# Patient Record
Sex: Female | Born: 1945 | Race: White | Hispanic: No | Marital: Married | Smoking: Former smoker
Health system: Southern US, Community
[De-identification: ages and names within clinical notes are randomized; demographics above are authoritative.]

## PROBLEM LIST (undated history)

## (undated) DIAGNOSIS — K649 Unspecified hemorrhoids: Secondary | ICD-10-CM

## (undated) DIAGNOSIS — K219 Gastro-esophageal reflux disease without esophagitis: Secondary | ICD-10-CM

## (undated) DIAGNOSIS — E669 Obesity, unspecified: Secondary | ICD-10-CM

## (undated) DIAGNOSIS — M1990 Unspecified osteoarthritis, unspecified site: Secondary | ICD-10-CM

## (undated) DIAGNOSIS — L259 Unspecified contact dermatitis, unspecified cause: Secondary | ICD-10-CM

## (undated) DIAGNOSIS — I4891 Unspecified atrial fibrillation: Secondary | ICD-10-CM

## (undated) DIAGNOSIS — K5790 Diverticulosis of intestine, part unspecified, without perforation or abscess without bleeding: Secondary | ICD-10-CM

## (undated) DIAGNOSIS — I509 Heart failure, unspecified: Secondary | ICD-10-CM

## (undated) DIAGNOSIS — I4819 Other persistent atrial fibrillation: Secondary | ICD-10-CM

## (undated) DIAGNOSIS — M549 Dorsalgia, unspecified: Secondary | ICD-10-CM

## (undated) HISTORY — DX: Other persistent atrial fibrillation: I48.19

## (undated) HISTORY — PX: OTHER SURGICAL HISTORY: SHX169

## (undated) HISTORY — PX: TUBAL LIGATION: SHX77

## (undated) HISTORY — PX: CHOLECYSTECTOMY: SHX55

## (undated) HISTORY — DX: Obesity, unspecified: E66.9

## (undated) HISTORY — DX: Unspecified atrial fibrillation: I48.91

## (undated) SURGERY — Surgical Case
Anesthesia: *Unknown

---

## 2001-12-30 ENCOUNTER — Emergency Department (HOSPITAL_COMMUNITY): Admission: EM | Admit: 2001-12-30 | Discharge: 2001-12-30 | Payer: Self-pay | Admitting: Emergency Medicine

## 2001-12-30 ENCOUNTER — Encounter: Payer: Self-pay | Admitting: Emergency Medicine

## 2002-03-10 ENCOUNTER — Encounter (HOSPITAL_COMMUNITY): Admission: RE | Admit: 2002-03-10 | Discharge: 2002-04-09 | Payer: Self-pay | Admitting: Preventative Medicine

## 2002-12-16 ENCOUNTER — Ambulatory Visit (HOSPITAL_COMMUNITY): Admission: RE | Admit: 2002-12-16 | Discharge: 2002-12-16 | Payer: Self-pay | Admitting: Family Medicine

## 2002-12-16 ENCOUNTER — Encounter: Payer: Self-pay | Admitting: Family Medicine

## 2003-06-19 ENCOUNTER — Ambulatory Visit (HOSPITAL_COMMUNITY): Admission: RE | Admit: 2003-06-19 | Discharge: 2003-06-19 | Payer: Self-pay | Admitting: Family Medicine

## 2003-10-17 ENCOUNTER — Ambulatory Visit (HOSPITAL_COMMUNITY): Admission: RE | Admit: 2003-10-17 | Discharge: 2003-10-17 | Payer: Self-pay | Admitting: Orthopedic Surgery

## 2005-02-06 ENCOUNTER — Ambulatory Visit (HOSPITAL_COMMUNITY): Admission: RE | Admit: 2005-02-06 | Discharge: 2005-02-06 | Payer: Self-pay | Admitting: Family Medicine

## 2005-04-15 ENCOUNTER — Observation Stay (HOSPITAL_COMMUNITY): Admission: EM | Admit: 2005-04-15 | Discharge: 2005-04-16 | Payer: Self-pay | Admitting: Emergency Medicine

## 2006-12-30 ENCOUNTER — Ambulatory Visit (HOSPITAL_COMMUNITY): Admission: RE | Admit: 2006-12-30 | Discharge: 2006-12-30 | Payer: Self-pay | Admitting: Family Medicine

## 2010-09-13 NOTE — Consult Note (Signed)
Brooke Luna, PRAZAK NO.:  [id number]   MEDICAL RECORD NO.:  [id number]          PATIENT TYPE:  INP   LOCATION:  A222                          FACILITY:  [facility]   PHYSICIAN:  Nanetta Batty, M.D.   DATE OF BIRTH:  [birth year]   DATE OF CONSULTATION:  04/16/2005  DATE OF DISCHARGE:                                   CONSULTATION   REFERRING PHYSICIAN:  Corrie Mckusick, M.D.   REASON FOR CONSULTATION:  Ms. Hufstedler is a 65 year old, mildly overweight,  married, white female, mother of two, grandmother to four grandchildren who  is referred by Dr. Phillips Odor for evaluation of chest pain.   The patient has basically no cardiac risk factors.  She does have reflux and  takes occasional PPI, but she is on no other medications.  She apparently  had a stress Cardiolite in our [place] office several years ago which was  negative.  She otherwise is a fairly healthy lady.  Yesterday, in the late  afternoon, she developed a prolonged episode of substernal chest pain which  doubled her over.  It radiated to her left upper extremity and was  associated with diaphoresis and resolved spontaneously.  She was admitted to  [facility] through the emergency room and ruled out for myocardial  infarction.   MEDICATIONS:  None.   ALLERGIES:  No known drug allergies.   REVIEW OF SYSTEMS:  See above.   PHYSICAL EXAMINATION:  VITAL SIGNS:  Blood pressure 116/77, pulse 69.  LUNGS:  Clear to auscultation.  HEART:  Regular rate and rhythm without murmurs, rubs or gallops.  ABDOMEN:  Soft, nontender with active bowel sounds.  No hepatosplenomegaly,  organomegaly or audible bruits.  EXTREMITIES:  Femoral arteries without bruits.  Intact pedal pulses.   LABORATORY DATA AND X-RAY FINDINGS:  Initial 12-lead EKG revealed sinus  rhythm without active T wave changes.  Followup serial EKGs performed this  morning revealed no anterolateral T wave inversion.  Her chest x-ray shows  no active  disease.   Laboratory exam was completely unremarkable including myocardial markers.   IMPRESSION:  Ms. Long had episode of unstable with dynamic  electrocardiogram changes.   RECOMMENDATIONS:  I have discussed her case with Dr. Phillips Odor who agrees to  having her transferred to [facility] for coronary arteriography this  morning.  The patient was scheduled to have a head computed tomography  because of a headache.  I will arrange the transfer.      Nanetta Batty, M.D.  Electronically Signed     JB/MEDQ  D:  04/16/2005  T:  04/17/2005  Job:  [id number]   cc:   [facility]  [address]  [place], [place] [address]

## 2010-09-13 NOTE — H&P (Signed)
NAMEVANESSIA, BOKHARI             ACCOUNT NO.:  [id number]   MEDICAL RECORD NO.:  [id number]          PATIENT TYPE:  INP   LOCATION:  A222                          FACILITY:  [facility]   PHYSICIAN:  Corrie Mckusick, M.D.  DATE OF BIRTH:  March 04, 1946   DATE OF ADMISSION:  04/15/2005  DATE OF DISCHARGE:  12/20/2006LH                                HISTORY & PHYSICAL   ADMITTING DIAGNOSIS:  Chest pain.   HISTORY OF PRESENT ILLNESS:  This is a 65 year old female with a history of  GERD, osteoarthritis, and a history of diverticulitis who presents to the  office with left sided chest pain.  She came in the office with a 2-hour  episode earlier in the day of left chest heaviness with arm numbness and  tingling.  The numbness and tingling seemed to stop at the left elbow.  She  had no associated shortness of breath, nausea, vomiting, or diaphoresis.  When she came into the office, she was found to be chest pain free and EKG  just showed some nonspecific ST changes.  Her vitals were quite stable in  the office and due to the suspicion of the chest pain, she was sent to the  emergency department for further evaluation.   In the emergency department, she was found to have an initial set of enzymes  negative.  EKG again showed nonspecific ST changes.  She was chest pain free  with no medication.  It was decided to admit her for a rule out protocol.   REVIEW OF SYSTEMS:  Otherwise negative.   PAST MEDICAL HISTORY:  1.  GERD.  2.  Osteoarthritis.  3.  Diverticulosis.   PAST SURGICAL HISTORY:  1.  Tubal in 1978.  2.  Fracture of the right elbow years ago.   SOCIAL HISTORY:  Smoked for many years but quit two years ago.  Occasional  alcohol use.  No illicit drug use.  Works at [employer].  She has two  children.   FAMILY HISTORY:  Significant for some diabetes but really no significant  coronary disease in her family.   MEDICATIONS:  On admission are Nexium 40 mg every day.   ALLERGIES:  None.   ADMISSION PHYSICAL EXAMINATION:  VITAL SIGNS:  Temp of 98.2, pulse 70,  respirations 20, blood pressure 124/70.  GENERAL:  When I saw her, once again in the office as well as in the  hospital she was pleasant, talkative, and in no acute distress with no pain.  HEENT:  She did have a mild frontal headache.  Naso and oropharynx were  clear.  Moist mucous membranes.  NECK:  Supple.  No lymphadenopathy.  No JVD.  CHEST:  Clear to auscultation bilaterally.  CARDIOVASCULAR:  Regular rate and rhythm.  Normal S1 S2.  No S3, S4,  murmurs, gallops, or rubs.  ABDOMEN:  Soft, nontender, nondistended.  EXTREMITIES:  No edema.   LABORATORY:  Please see flow sheet for details.  There is really no  remarkable labs.  Chest x-ray reportedly negative by Dr. Rosalia Hammers.  EKG:  Normal  sinus rhythm, normal axis, normal  intervals with just some nonspecific ST  depression precordial leads.   ASSESSMENT:  A 65 year old female with gastroesophageal reflux disease,  history of arthritis, and diverticulosis who presents with chest pain  possibly suspicious for a cardiac source.   PLAN:  1.  Admit to 2A for close monitoring.  2.  Rule out protocol.  3.  Consult cardiology.  4.  Will need further risk stratification.  5.  We will go ahead and cover with an IV PPI.      Corrie Mckusick, M.D.  Electronically Signed     JCG/MEDQ  D:  04/16/2005  T:  04/16/2005  Job:  [id number]

## 2010-09-13 NOTE — Cardiovascular Report (Signed)
NAMEZYAH, GOMM             ACCOUNT NO.:  [id number]   MEDICAL RECORD NO.:  [id number]          PATIENT TYPE:  INP   LOCATION:  2910                         FACILITY:  [facility]   PHYSICIAN:  Darlin Priestly, MD  DATE OF BIRTH:  [birth year]   DATE OF PROCEDURE:  04/16/2005  DATE OF DISCHARGE:  04/16/2005                              CARDIAC CATHETERIZATION   PROCEDURES:  1.  Left heart catheterization.  2.  Coronary angiography.  3.  Left ventriculogram.  4.  Abdominal aortogram.   ATTENDING STAFF:  Darlin Priestly, M.D.   COMPLICATIONS:  None.   INDICATIONS:  Ms. Heiner is a 65 year old female patient of Corrie Mckusick, M.D., admitted to [facility] on April 15, 2005, with an  episode of substernal chest pain which lasted approximated 15 minutes,  radiating to her left upper extremity, associated with nausea and  diaphoresis.  She was noted to have new anterolateral T-wave inversions.  She is now transferred to [facility] for cardiac catheterization.   DESCRIPTION OF OPERATION:  After giving informed written consent, the  patient was brought to the cardiac catheterization.  The right groin was  shaved, prepped and draped in the usual sterile fashion.  Anesthesia  monitoring was established and using the modified Seldinger technique, a #6  French arterial sheath was inserted in the right femoral artery.  Six French  diagnostic catheters then used to perform diagnostic angiography.   1.  The left main is a large vessel with no significant disease.  2.  The LAD is a large vessel, which is tortuous in its proximal segment,      gives rise to a large septal perforator as well as medium-sized diagonal      branch.  The LAD has no significant disease.  3.  The first diagonal is a medium-sized vessel which bifurcates distally,      with no significant disease.  4.  The left circumflex is a large vessel that courses in the AV groove and      gives rise  to one obtuse marginal branch.  The AV groove circumflex has      no significant disease.  5.  The first OM is a medium to large-sized vessel with no significant      disease.  6.  The right coronary artery is a large vessel, which is dominant and gives      rise to a PDA as well as a posterolateral branch.  There is no      significant disease in the RCA, PDA or posterolateral branch.   Left ventriculogram:  There is a preserved EF at 60%.   Abdominal aortogram:  There was no evidence of significant renal artery  stenosis.   HEMODYNAMICS:  Central arterial pressure 150/8, LV systemic pressure 151/11,  LVEDP of 20.   CONCLUSION:  1.  No significant coronary artery disease.  2.  Normal left ventricular systolic function.  3.  No evidence of renal artery stenosis.  4.  Systemic hypertension.  5.  Elevated left ventricular end-diastolic pressure.  Darlin Priestly, MD  Electronically Signed     RHM/MEDQ  D:  04/16/2005  T:  04/18/2005  Job:  [id number]   cc:   Zollie Scale, PA  [address] [place], [place] [address]   Corrie Mckusick, M.D.  Fax: [phone]

## 2010-09-13 NOTE — Discharge Summary (Signed)
NAMEZAMARIA, BRAZZLE             ACCOUNT NO.:  [id number]   MEDICAL RECORD NO.:  [id number]          PATIENT TYPE:  INP   LOCATION:  2910                         FACILITY:  [facility]   PHYSICIAN:  Nanetta Batty, M.D.   DATE OF BIRTH:  [birth year]   DATE OF ADMISSION:  04/16/2005  DATE OF DISCHARGE:  04/16/2005                                 DISCHARGE SUMMARY   DISCHARGE DIAGNOSIS:  1.  Chest pain, negative myocardial infarction.      1.  Patent coronary arteries.  2.  History of gastroesophageal reflux disease with probable reflux causing      pain.  3.  Osteoarthritis.  4.  History of diverticulitis.   CONDITION ON DISCHARGE:  Improved.   PROCEDURES:  Combined left heart catheterization by Dr. Lenise Herald with  normal coronaries and ejection fraction 60%.   DISCHARGE MEDICATIONS:  Nexium 40 mg b.i.d. for one week then one daily.   DISCHARGE INSTRUCTIONS:  Low fat, low salt diet.  Wash right groin cath site  with soap and water, call if any bleeding, swelling, or drainage.  See Dr.  Phillips Odor in one week or so, call for an appointment.  Return to Dr. Allyson Sabal or  Dr. Domingo Sep as needed under the instruction of Dr. Phillips Odor.   HOSPITAL COURSE:  65 year old white female with a history of  gastroesophageal reflux disease, osteoarthritis, history of diverticulitis,  presents to Dr. Lamar Blinks office yesterday, April 15, 2005, with left  sided chest pain.  She came with a two hour episode early in the day of left  chest heaviness with arm numbness and tingling.  The numbness and tingling  stopped at the left elbow.  No associated symptoms of shortness of breath,  nausea, vomiting, or diaphoresis.  When she came into the office, she was  found to be chest pain free.  EKG showed nonspecific ST changes.  Vital  signs were stable and she was sent to the emergency room for further  evaluation.  Enzymes were negative.  Again, EKG was nonspecific.  The  patient was kept overnight.  On the  morning of April 16, 2005, Dr. Allyson Sabal  saw the patient and was concerned about T-wave inversions in V1, V2, V3, and  V4.  She was then transferred to [facility] where she underwent  cardiac catheterization and found to have patent coronary arteries.   By the afternoon of April 16, 2005, she was stable.  As soon as she  ambulates, she will be able to be discharged as she has no complications.  Otherwise, she will follow up with her primary care physician.  Certainly if  she has any other cardiac issues or any problems with her cardiac cath site,  she will be referred back our office.      Darcella Gasman. Annie Paras, N.P.      Nanetta Batty, M.D.  Electronically Signed    LRI/MEDQ  D:  04/16/2005  T:  04/18/2005  Job:  [id number]   cc:   Corrie Mckusick, M.D.  Fax: [phone]

## 2011-08-24 ENCOUNTER — Other Ambulatory Visit: Payer: Self-pay | Admitting: Orthopedic Surgery

## 2011-08-24 MED ORDER — DEXAMETHASONE SODIUM PHOSPHATE 10 MG/ML IJ SOLN: 10.0000 mg | Freq: Once | INTRAMUSCULAR | Status: DC

## 2011-08-24 MED ORDER — BUPIVACAINE 0.25 % ON-Q PUMP SINGLE CATH 300ML: 300.0000 mL | INJECTION | Status: DC

## 2011-10-17 ENCOUNTER — Encounter (HOSPITAL_COMMUNITY): Payer: Self-pay | Admitting: Pharmacy Technician

## 2011-10-23 ENCOUNTER — Encounter (HOSPITAL_COMMUNITY): Payer: Self-pay

## 2011-10-23 ENCOUNTER — Encounter (HOSPITAL_COMMUNITY)
Admission: RE | Admit: 2011-10-23 | Discharge: 2011-10-23 | Disposition: A | Discharge status: Home / Self Care | Payer: Medicare Other | Source: Ambulatory Visit | Attending: Orthopedic Surgery | Admitting: Orthopedic Surgery

## 2011-10-23 HISTORY — DX: Diverticulosis of intestine, part unspecified, without perforation or abscess without bleeding: K57.90

## 2011-10-23 HISTORY — DX: Dorsalgia, unspecified: M54.9

## 2011-10-23 HISTORY — DX: Unspecified osteoarthritis, unspecified site: M19.90

## 2011-10-23 HISTORY — DX: Unspecified contact dermatitis, unspecified cause: L25.9

## 2011-10-23 HISTORY — DX: Gastro-esophageal reflux disease without esophagitis: K21.9

## 2011-10-23 HISTORY — DX: Unspecified hemorrhoids: K64.9

## 2011-10-23 LAB — COMPREHENSIVE METABOLIC PANEL
AST: 18 U/L (ref 0–37)
Albumin: 4 g/dL (ref 3.5–5.2)
CO2: 26 mEq/L (ref 19–32)
Creatinine, Ser: 0.82 mg/dL (ref 0.50–1.10)
Glucose, Bld: 96 mg/dL (ref 70–99)
Sodium: 141 mEq/L (ref 135–145)
Total Bilirubin: 0.6 mg/dL (ref 0.3–1.2)

## 2011-10-23 LAB — URINALYSIS, ROUTINE W REFLEX MICROSCOPIC
Glucose, UA: NEGATIVE mg/dL
Hgb urine dipstick: NEGATIVE
Leukocytes, UA: NEGATIVE
Protein, ur: NEGATIVE mg/dL
pH: 6 (ref 5.0–8.0)

## 2011-10-23 LAB — CBC
HCT: 41.5 % (ref 36.0–46.0)
Hemoglobin: 13.6 g/dL (ref 12.0–15.0)
MCH: 27.9 pg (ref 26.0–34.0)
MCHC: 32.8 g/dL (ref 30.0–36.0)
RBC: 4.88 MIL/uL (ref 3.87–5.11)

## 2011-10-23 LAB — SURGICAL PCR SCREEN
MRSA, PCR: NEGATIVE
Staphylococcus aureus: NEGATIVE

## 2011-10-23 LAB — PROTIME-INR: Prothrombin Time: 12.9 seconds (ref 11.6–15.2)

## 2011-10-23 NOTE — Patient Instructions (Signed)
YOUR SURGERY IS SCHEDULED ON:   Monday  7/8  AT 7:15 AM  REPORT TO [place] SHORT STAY CENTER AT:  5:15 AM      PHONE # FOR SHORT STAY IS [phone]  DO NOT EAT OR DRINK ANYTHING AFTER MIDNIGHT THE NIGHT BEFORE YOUR SURGERY.  YOU MAY BRUSH YOUR TEETH, RINSE OUT YOUR MOUTH--BUT NO WATER, NO FOOD, NO CHEWING GUM, NO MINTS, NO CANDIES, NO CHEWING TOBACCO.  PLEASE TAKE THE FOLLOWING MEDICATIONS THE AM OF YOUR SURGERY WITH A FEW SIPS OF WATER:  NO MEDS TO TAKE    IF YOU USE INHALERS--USE YOUR INHALERS THE AM OF YOUR SURGERY AND BRING INHALERS TO THE HOSPITAL -TAKE TO SURGERY.    IF YOU ARE DIABETIC:  DO NOT TAKE ANY DIABETIC MEDICATIONS THE AM OF YOUR SURGERY.  IF YOU TAKE INSULIN IN THE EVENINGS--PLEASE ONLY TAKE 1/2 NORMAL EVENING DOSE THE NIGHT BEFORE YOUR SURGERY.  NO INSULIN THE AM OF YOUR SURGERY.  IF YOU HAVE SLEEP APNEA AND USE CPAP OR BIPAP--PLEASE BRING THE MASK --NOT THE MACHINE-NOT THE TUBING   -JUST THE MASK. DO NOT BRING VALUABLES, MONEY, CREDIT CARDS.  CONTACT LENS, DENTURES / PARTIALS, GLASSES SHOULD NOT BE WORN TO SURGERY AND IN MOST CASES-HEARING AIDS WILL NEED TO BE REMOVED.  BRING YOUR GLASSES CASE, ANY EQUIPMENT NEEDED FOR YOUR CONTACT LENS. FOR PATIENTS ADMITTED TO THE HOSPITAL--CHECK OUT TIME THE DAY OF DISCHARGE IS 11:00 AM.  ALL INPATIENT ROOMS ARE PRIVATE - WITH BATHROOM, TELEPHONE, TELEVISION AND WIFI INTERNET. IF YOU ARE BEING DISCHARGED THE SAME DAY OF YOUR SURGERY--YOU CAN NOT DRIVE YOURSELF HOME--AND SHOULD NOT GO HOME ALONE BY TAXI OR BUS.  NO DRIVING OR OPERATING MACHINERY FOR 24 HOURS FOLLOWING ANESTHESIA / PAIN MEDICATIONS.                            SPECIAL INSTRUCTIONS:  CHLORHEXIDINE SOAP SHOWER (other brand names are Betasept and Hibiclens ) PLEASE SHOWER WITH CHLORHEXIDINE THE NIGHT BEFORE YOUR SURGERY AND THE AM OF YOUR SURGERY. DO NOT USE CHLORHEXIDINE ON YOUR FACE OR PRIVATE AREAS--YOU MAY USE YOUR NORMAL SOAP THOSE AREAS AND YOUR NORMAL SHAMPOO.    WOMEN SHOULD AVOID SHAVING UNDER ARMS AND SHAVING LEGS 48 HOURS BEFORE USING CHLORHEXIDINE TO AVOID SKIN IRRITATION.  DO NOT USE IF ALLERGIC TO CHLORHEXIDINE.  PLEASE READ OVER ANY  FACT SHEETS THAT YOU WERE GIVEN: MRSA INFORMATION, BLOOD TRANSFUSION INFORMATION, INCENTIVE SPIROMETER INFORMATION.

## 2011-10-23 NOTE — Pre-Procedure Instructions (Signed)
PT BROUGHT HER ENVELOPE FROM DR. Deri Fuelling OFFICE--WITH NOTE OF MEDICAL CLEARANCE STATING LABS AND EKG TO DONE PREOP (AT HOSPITAL) -CLEARANCE FROM DR. [facility] WITH [facility]. CBC, CMET, PT, PTT, UA AND EKG WERE DONE TODAY PREOP AT [facility] - T/S WILL BE DONE THE DAY OF PT'S SURGERY. PREOP INSTRUCTIONS DISCUSSED WITH PT USING TEACH BACK METHOD.

## 2011-11-02 ENCOUNTER — Other Ambulatory Visit: Payer: Self-pay | Admitting: Orthopedic Surgery

## 2011-11-02 NOTE — H&P (Signed)
Artemio Aly  DOB: May 01, 1945 Married / Language: English / Race: White / Female  Date of Admission:  11/03/2011  Chief complaint:  Right Knee Pain  History of Present Illness The patient is a 66 year old female who comes in for a preoperative History and Physical. The patient is scheduled for a right total knee arthroplasty to be performed by Dr. Gus Rankin. Aluisio, MD at [facility] on 11/03/2011. The patient is a 66 year old female who presents with knee complaints. The patient is seen today initial evaluation of right knee. The patient reports right knee symptoms including: pain and swelling which began year(s) ago without any known injury. The patient describes the severity of the symptoms as severe.The patient feels that the symptoms are worsening. Prior to being seen today the patient was previously evaluated by a colleague 8 year(s) ago. Previous work-up for this problem has included knee x-rays. Past treatment for this problem has included intra-articular injection of corticosteroids (caused severe pain, could not walk on right leg). Symptoms are reported to be located in the right knee and include knee pain and swelling. The patient reports that symptoms radiate to the right thigh and right lower leg. The patient describes their pain as sharp, dull, aching and throbbing. Onset of symptoms was gradual. Symptoms are exacerbated by weight bearing and walking. Recently went to PCP due to severity of pain, was prescribed tramadol, which doesnt help.  The knee hurts at all times. It limits what she can and cannot do. Injections have not helped at all. She is at a stage now where she feels like she needs to have something done to fix this. They have been treated conservatively in the past for the above stated problem and despite conservative measures, they continue to have progressive pain and severe functional limitations and dysfunction. They have failed non-operative  management including home exercise, medications, and injections. It is felt that they would benefit from undergoing total joint replacement. Risks and benefits of the procedure have been discussed with the patient and they elect to proceed with surgery. There are no active contraindications to surgery such as ongoing infection or rapidly progressive neurological disease.  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 Itching - (She has been taking Hydrocodone though) MOBIC. 10/18/2003 MOBIC CAUSES STOMACH IRRITATION   Family History Diabetes Mellitus. grandfather mothers side Rheumatoid Arthritis. child Cerebrovascular Accident. mother and grandmother mothers side Father. Deceased. age 59, car accident Mother. Deceased, Cerebrovascular Accident. age 67   Social History Living situation. live with spouse Marital status. married Number of flights of stairs before winded. less than 1 Drug/Alcohol Rehab (Currently). no Exercise. Exercises never Illicit drug use. no Tobacco use. former smoker; smoke(d) 1 1/2 pack(s) per day Pain Contract. no Previously in rehab. no Tobacco / smoke exposure. no Children. 2 Current work status. retired Alcohol use. current drinker; drinks beer; less than 5 per week Post-Surgical Plans. Plan is to go home.   Medication History Hydrocodone/Acetaminophen ( Oral) Active. Ambien (5MG  Tablet, 1/2 Oral at bedtime) Active.   Past Surgical History Other Surgery. Broken right elbow Tubal Ligation   Review of Systems General:Not Present- Chills, Fever, Night Sweats, Fatigue, Weight Gain, Weight Loss and Memory Loss. Skin:Not Present- Hives, Itching, Rash, Eczema and Lesions. HEENT:Not Present- Tinnitus, Headache, Double Vision, Visual Loss, Hearing  Loss and Dentures. Respiratory:Not Present- Shortness of breath with exertion, Shortness  of breath at rest, Allergies, Coughing up blood and Chronic Cough. Cardiovascular:Present- Difficulty Breathing Lying Down (She attributes this to her weight and breasts sliding upward.). Not Present- Chest Pain, Racing/skipping heartbeats, Murmur, Swelling and Palpitations. Gastrointestinal:Present- Bloody Stool (Attribute this to the sensitive hemorrhoids.). Not Present- Heartburn, Abdominal Pain, Vomiting, Nausea, Constipation, Diarrhea, Difficulty Swallowing, Jaundice and Loss of appetitie. Female Genitourinary:Not Present- Blood in Urine, Urinary frequency, Weak urinary stream, Discharge, Flank Pain, Incontinence, Painful Urination, Urgency, Urinary Retention and Urinating at Night. Musculoskeletal:Present- Muscle Pain, Joint Swelling, Joint Pain and Back Pain. Not Present- Muscle Weakness, Morning Stiffness and Spasms. Neurological:Not Present- Tremor, Dizziness, Blackout spells, Paralysis, Difficulty with balance and Weakness. Psychiatric:Not Present- Insomnia.   Vitals Weight: 150 lb Height: 65 in Body Surface Area: 1.77 m Body Mass Index: 24.96 kg/m Pulse: 64 (Regular) Resp.: 14 (Unlabored) BP: 128/78 (Sitting, Right Arm, Standard)    Physical Exam Pateint is a 66 year old female with continued kene pain. Patient is accompanied today by her husband.   General Mental Status - Alert, cooperative and good historian. General Appearance- pleasant. Not in acute distress. Orientation- Oriented X3. Build & Nutrition- Well nourished and Well developed.   Head and Neck Head- normocephalic, atraumatic . Neck Global Assessment- supple. no bruit auscultated on the right and no bruit auscultated on the left.   Eye Pupil- Bilateral- Regular and Round. Motion- Bilateral- EOMI.   ENMT  Upper denture plate  Chest and Lung Exam Auscultation: Breath sounds:-  clear at anterior chest wall and - clear at posterior chest wall. Adventitious sounds:- No Adventitious sounds.   Cardiovascular Auscultation:Rhythm- Regular rate and rhythm. Heart Sounds- S1 WNL and S2 WNL. Murmurs & Other Heart Sounds:Auscultation of the heart reveals - No Murmurs.   Abdomen Inspection:Contour- Generalized moderate distention. Palpation/Percussion:Tenderness- Abdomen is non-tender to palpation. Rigidity (guarding)- Abdomen is soft. Auscultation:Auscultation of the abdomen reveals - Bowel sounds normal.   Female Genitourinary Not done, not pertinent to present illness  Musculoskeletal Her hips show normal range of motion, no discomfort. Left knee - no effusion. Range is 0 to 125. Minimal crepitus on range of motion. No tenderness or instability. Right knee - no effusion, marked crepitus on range of motion, tender medial greater than lateral. No instability noted. Pulse, sensation, and motor intact in both lower extremities. She has slight varus on her right knee. Gait pattern is antalgic on the right .  RADIOGRAPHS: AP and lateral of both knees show that on the left there is minimal degenerative change. On the right, she is bone on bone medial and patellofemoral with some tibial subluxation.  Assessment & Plan Osteoarthritis Right Knee  Note: Patient is for a Right Total Knee Replacement by Dr. Lequita Halt.  Plan is to go home.  PCP - Dr. [name] - Patient has been seen by Dr. Sherryll Burger and felt to be stable for surgery. [facility]  Signed electronically by Roberts Gaudy, PA-C

## 2011-11-03 ENCOUNTER — Encounter (HOSPITAL_COMMUNITY): Payer: Self-pay | Admitting: Anesthesiology

## 2011-11-03 ENCOUNTER — Encounter (HOSPITAL_COMMUNITY): Payer: Self-pay | Admitting: *Deleted

## 2011-11-03 ENCOUNTER — Encounter (HOSPITAL_COMMUNITY)
Admission: RE | Disposition: A | Discharge status: Home Health | Payer: Self-pay | Source: Ambulatory Visit | Attending: Orthopedic Surgery

## 2011-11-03 ENCOUNTER — Inpatient Hospital Stay (HOSPITAL_COMMUNITY)
Admission: RE | Admit: 2011-11-03 | Discharge: 2011-11-06 | DRG: 470 | Disposition: A | Discharge status: Home Health | Payer: Medicare Other | Source: Ambulatory Visit | Attending: Orthopedic Surgery | Admitting: Orthopedic Surgery

## 2011-11-03 ENCOUNTER — Ambulatory Visit (HOSPITAL_COMMUNITY): Payer: Medicare Other | Admitting: Anesthesiology

## 2011-11-03 DIAGNOSIS — M1710 Unilateral primary osteoarthritis, unspecified knee: Principal | ICD-10-CM | POA: Diagnosis present

## 2011-11-03 DIAGNOSIS — Z01812 Encounter for preprocedural laboratory examination: Secondary | ICD-10-CM

## 2011-11-03 DIAGNOSIS — E871 Hypo-osmolality and hyponatremia: Secondary | ICD-10-CM | POA: Diagnosis not present

## 2011-11-03 DIAGNOSIS — K219 Gastro-esophageal reflux disease without esophagitis: Secondary | ICD-10-CM | POA: Diagnosis present

## 2011-11-03 DIAGNOSIS — Z6839 Body mass index (BMI) 39.0-39.9, adult: Secondary | ICD-10-CM

## 2011-11-03 DIAGNOSIS — Z96659 Presence of unspecified artificial knee joint: Secondary | ICD-10-CM

## 2011-11-03 HISTORY — PX: TOTAL KNEE ARTHROPLASTY: SHX125

## 2011-11-03 LAB — ABO/RH: ABO/RH(D): O NEG

## 2011-11-03 LAB — TYPE AND SCREEN
ABO/RH(D): O NEG
Antibody Screen: NEGATIVE

## 2011-11-03 SURGERY — ARTHROPLASTY, KNEE, TOTAL
Anesthesia: Spinal | Site: Knee | Laterality: Right | Wound class: Clean

## 2011-11-03 MED ORDER — DIPHENHYDRAMINE HCL 12.5 MG/5ML PO ELIX: 12.5000 mg | ORAL_SOLUTION | Freq: Four times a day (QID) | ORAL | Status: DC | PRN

## 2011-11-03 MED ORDER — ONDANSETRON HCL 4 MG/2ML IJ SOLN
INTRAMUSCULAR | Status: DC | PRN
  Administered 2011-11-03: 4 mg via INTRAVENOUS

## 2011-11-03 MED ORDER — METHOCARBAMOL 100 MG/ML IJ SOLN
500.0000 mg | Freq: Four times a day (QID) | INTRAVENOUS | Status: DC | PRN
  Administered 2011-11-03: 500 mg via INTRAVENOUS
  Filled 2011-11-03: qty 5

## 2011-11-03 MED ORDER — NYSTATIN-TRIAMCINOLONE 100000-0.1 UNIT/GM-% EX CREA
1.0000 "application " | TOPICAL_CREAM | Freq: Two times a day (BID) | CUTANEOUS | Status: DC
  Administered 2011-11-03 – 2011-11-04 (×2): 1 via TOPICAL
  Filled 2011-11-03: qty 15

## 2011-11-03 MED ORDER — PHENOL 1.4 % MT LIQD
1.0000 | OROMUCOSAL | Status: DC | PRN
  Filled 2011-11-03: qty 177

## 2011-11-03 MED ORDER — ACETAMINOPHEN 10 MG/ML IV SOLN
1000.0000 mg | Freq: Once | INTRAVENOUS | Status: AC
  Administered 2011-11-03: 1000 mg via INTRAVENOUS
  Filled 2011-11-03: qty 100

## 2011-11-03 MED ORDER — RIVAROXABAN 10 MG PO TABS
10.0000 mg | ORAL_TABLET | Freq: Every day | ORAL | Status: DC
  Administered 2011-11-04 – 2011-11-06 (×3): 10 mg via ORAL
  Filled 2011-11-03 (×4): qty 1

## 2011-11-03 MED ORDER — METOCLOPRAMIDE HCL 10 MG PO TABS: 5.0000 mg | ORAL_TABLET | Freq: Three times a day (TID) | ORAL | Status: DC | PRN

## 2011-11-03 MED ORDER — HYDROMORPHONE 0.3 MG/ML IV SOLN
INTRAVENOUS | Status: AC
  Filled 2011-11-03: qty 25

## 2011-11-03 MED ORDER — HYDROMORPHONE 0.3 MG/ML IV SOLN
INTRAVENOUS | Status: DC
  Administered 2011-11-03: 09:00:00 via INTRAVENOUS

## 2011-11-03 MED ORDER — FENTANYL CITRATE 0.05 MG/ML IJ SOLN
INTRAMUSCULAR | Status: DC | PRN
  Administered 2011-11-03: 100 ug via INTRAVENOUS

## 2011-11-03 MED ORDER — PROPOFOL 10 MG/ML IV BOLUS
INTRAVENOUS | Status: DC | PRN
  Administered 2011-11-03 (×5): 30 mg via INTRAVENOUS

## 2011-11-03 MED ORDER — BUPIVACAINE 0.25 % ON-Q PUMP SINGLE CATH 300ML
INJECTION | Status: DC | PRN
  Administered 2011-11-03: 300 mL

## 2011-11-03 MED ORDER — NALOXONE HCL 0.4 MG/ML IJ SOLN: 0.4000 mg | INTRAMUSCULAR | Status: DC | PRN

## 2011-11-03 MED ORDER — CEFAZOLIN SODIUM 1-5 GM-% IV SOLN
1.0000 g | Freq: Four times a day (QID) | INTRAVENOUS | Status: AC
  Administered 2011-11-03 (×2): 1 g via INTRAVENOUS
  Filled 2011-11-03 (×2): qty 50

## 2011-11-03 MED ORDER — RIVAROXABAN 10 MG PO TABS
10.0000 mg | ORAL_TABLET | Freq: Every day | ORAL | Status: DC
  Filled 2011-11-03: qty 1

## 2011-11-03 MED ORDER — BISACODYL 10 MG RE SUPP: 10.0000 mg | Freq: Every day | RECTAL | Status: DC | PRN

## 2011-11-03 MED ORDER — POLYETHYLENE GLYCOL 3350 17 G PO PACK: 17.0000 g | PACK | Freq: Every day | ORAL | Status: DC | PRN

## 2011-11-03 MED ORDER — DEXAMETHASONE SODIUM PHOSPHATE 4 MG/ML IJ SOLN
INTRAMUSCULAR | Status: DC | PRN
  Administered 2011-11-03: 10 mg via INTRAVENOUS

## 2011-11-03 MED ORDER — FLEET ENEMA 7-19 GM/118ML RE ENEM: 1.0000 | ENEMA | Freq: Once | RECTAL | Status: AC | PRN

## 2011-11-03 MED ORDER — CHLORHEXIDINE GLUCONATE 4 % EX LIQD
60.0000 mL | Freq: Once | CUTANEOUS | Status: DC
  Filled 2011-11-03: qty 60

## 2011-11-03 MED ORDER — SODIUM CHLORIDE 0.9 % IJ SOLN: 9.0000 mL | INTRAMUSCULAR | Status: DC | PRN

## 2011-11-03 MED ORDER — TRAMADOL HCL 50 MG PO TABS
50.0000 mg | ORAL_TABLET | Freq: Four times a day (QID) | ORAL | Status: DC | PRN
  Administered 2011-11-06: 50 mg via ORAL

## 2011-11-03 MED ORDER — MENTHOL 3 MG MT LOZG
1.0000 | LOZENGE | OROMUCOSAL | Status: DC | PRN
  Filled 2011-11-03: qty 9

## 2011-11-03 MED ORDER — MIDAZOLAM HCL 5 MG/5ML IJ SOLN
INTRAMUSCULAR | Status: DC | PRN
  Administered 2011-11-03: 2 mg via INTRAVENOUS

## 2011-11-03 MED ORDER — LACTATED RINGERS IV SOLN
INTRAVENOUS | Status: DC | PRN
  Administered 2011-11-03 (×3): via INTRAVENOUS

## 2011-11-03 MED ORDER — ACETAMINOPHEN 650 MG RE SUPP: 650.0000 mg | Freq: Four times a day (QID) | RECTAL | Status: DC | PRN

## 2011-11-03 MED ORDER — BUPIVACAINE IN DEXTROSE 0.75-8.25 % IT SOLN
INTRATHECAL | Status: DC | PRN
  Administered 2011-11-03: 10 mg via INTRATHECAL

## 2011-11-03 MED ORDER — CEFAZOLIN SODIUM-DEXTROSE 2-3 GM-% IV SOLR
2.0000 g | INTRAVENOUS | Status: AC
  Administered 2011-11-03: 2 g via INTRAVENOUS

## 2011-11-03 MED ORDER — METHOCARBAMOL 500 MG PO TABS
500.0000 mg | ORAL_TABLET | Freq: Four times a day (QID) | ORAL | Status: DC | PRN
  Administered 2011-11-03 – 2011-11-05 (×6): 500 mg via ORAL
  Filled 2011-11-03 (×6): qty 1

## 2011-11-03 MED ORDER — DIPHENHYDRAMINE HCL 50 MG/ML IJ SOLN: 12.5000 mg | Freq: Four times a day (QID) | INTRAMUSCULAR | Status: DC | PRN

## 2011-11-03 MED ORDER — HYDROMORPHONE HCL 2 MG PO TABS
2.0000 mg | ORAL_TABLET | ORAL | Status: DC | PRN
  Administered 2011-11-03 – 2011-11-04 (×4): 2 mg via ORAL
  Filled 2011-11-03 (×4): qty 1

## 2011-11-03 MED ORDER — SODIUM CHLORIDE 0.9 % IR SOLN
Status: DC | PRN
  Administered 2011-11-03: 1000 mL

## 2011-11-03 MED ORDER — HYDROMORPHONE HCL PF 1 MG/ML IJ SOLN: 0.2500 mg | INTRAMUSCULAR | Status: DC | PRN

## 2011-11-03 MED ORDER — 0.9 % SODIUM CHLORIDE (POUR BTL) OPTIME
TOPICAL | Status: DC | PRN
  Administered 2011-11-03: 1000 mL

## 2011-11-03 MED ORDER — CLOTRIMAZOLE 1 % EX CREA
TOPICAL_CREAM | Freq: Two times a day (BID) | CUTANEOUS | Status: DC | PRN
  Filled 2011-11-03: qty 15

## 2011-11-03 MED ORDER — DIPHENHYDRAMINE HCL 12.5 MG/5ML PO ELIX: 12.5000 mg | ORAL_SOLUTION | ORAL | Status: DC | PRN

## 2011-11-03 MED ORDER — ACETAMINOPHEN 325 MG PO TABS
650.0000 mg | ORAL_TABLET | Freq: Four times a day (QID) | ORAL | Status: DC | PRN
  Administered 2011-11-05: 650 mg via ORAL
  Filled 2011-11-03: qty 2

## 2011-11-03 MED ORDER — ONDANSETRON HCL 4 MG/2ML IJ SOLN: 4.0000 mg | Freq: Four times a day (QID) | INTRAMUSCULAR | Status: DC | PRN

## 2011-11-03 MED ORDER — SODIUM CHLORIDE 0.9 % IV SOLN: INTRAVENOUS | Status: DC

## 2011-11-03 MED ORDER — HYDROMORPHONE 0.3 MG/ML IV SOLN
INTRAVENOUS | Status: DC
  Administered 2011-11-03: 3.49 mg via INTRAVENOUS

## 2011-11-03 MED ORDER — LACTATED RINGERS IV SOLN: INTRAVENOUS | Status: DC

## 2011-11-03 MED ORDER — ACETAMINOPHEN 10 MG/ML IV SOLN
INTRAVENOUS | Status: AC
  Filled 2011-11-03: qty 100

## 2011-11-03 MED ORDER — BUPIVACAINE 0.25 % ON-Q PUMP SINGLE CATH 300ML
INJECTION | Status: AC
  Filled 2011-11-03: qty 300

## 2011-11-03 MED ORDER — ACETAMINOPHEN 10 MG/ML IV SOLN
1000.0000 mg | Freq: Four times a day (QID) | INTRAVENOUS | Status: AC
  Administered 2011-11-03 – 2011-11-04 (×4): 1000 mg via INTRAVENOUS
  Filled 2011-11-03 (×5): qty 100

## 2011-11-03 MED ORDER — ONDANSETRON HCL 4 MG PO TABS
4.0000 mg | ORAL_TABLET | Freq: Four times a day (QID) | ORAL | Status: DC | PRN
  Administered 2011-11-04 – 2011-11-06 (×4): 4 mg via ORAL
  Filled 2011-11-03 (×4): qty 1

## 2011-11-03 MED ORDER — BUPIVACAINE ON-Q PAIN PUMP (FOR ORDER SET NO CHG)
INJECTION | Status: DC
  Filled 2011-11-03: qty 1

## 2011-11-03 MED ORDER — ZOLPIDEM TARTRATE 5 MG PO TABS: 5.0000 mg | ORAL_TABLET | Freq: Every evening | ORAL | Status: DC | PRN

## 2011-11-03 MED ORDER — CEFAZOLIN SODIUM-DEXTROSE 2-3 GM-% IV SOLR
INTRAVENOUS | Status: AC
  Filled 2011-11-03: qty 50

## 2011-11-03 MED ORDER — KCL IN DEXTROSE-NACL 20-5-0.45 MEQ/L-%-% IV SOLN
INTRAVENOUS | Status: DC
  Administered 2011-11-03: 20:00:00 via INTRAVENOUS
  Filled 2011-11-03 (×3): qty 1000

## 2011-11-03 MED ORDER — PROPOFOL 10 MG/ML IV EMUL
INTRAVENOUS | Status: DC | PRN
  Administered 2011-11-03: 100 ug/kg/min via INTRAVENOUS

## 2011-11-03 MED ORDER — KCL IN DEXTROSE-NACL 20-5-0.45 MEQ/L-%-% IV SOLN
INTRAVENOUS | Status: AC
  Filled 2011-11-03: qty 1000

## 2011-11-03 MED ORDER — METOCLOPRAMIDE HCL 5 MG/ML IJ SOLN: 5.0000 mg | Freq: Three times a day (TID) | INTRAMUSCULAR | Status: DC | PRN

## 2011-11-03 MED ORDER — DOCUSATE SODIUM 100 MG PO CAPS
100.0000 mg | ORAL_CAPSULE | Freq: Two times a day (BID) | ORAL | Status: DC
  Administered 2011-11-03 – 2011-11-06 (×6): 100 mg via ORAL

## 2011-11-03 SURGICAL SUPPLY — 51 items
BAG ZIPLOCK 12X15 (MISCELLANEOUS) ×2 IMPLANT
BANDAGE ELASTIC 6 VELCRO ST LF (GAUZE/BANDAGES/DRESSINGS) ×2 IMPLANT
BANDAGE ESMARK 6X9 LF (GAUZE/BANDAGES/DRESSINGS) ×1 IMPLANT
BLADE SAG 18X100X1.27 (BLADE) ×2 IMPLANT
BLADE SAW SGTL 11.0X1.19X90.0M (BLADE) ×2 IMPLANT
BNDG ESMARK 6X9 LF (GAUZE/BANDAGES/DRESSINGS) ×2
BOWL SMART MIX CTS (DISPOSABLE) ×2 IMPLANT
CATH KIT ON-Q SILVERSOAK 5IN (CATHETERS) ×2 IMPLANT
CEMENT HV SMART SET (Cement) ×4 IMPLANT
CLOTH BEACON ORANGE TIMEOUT ST (SAFETY) ×2 IMPLANT
CUFF TOURN SGL QUICK 34 (TOURNIQUET CUFF) ×1
CUFF TRNQT CYL 34X4X40X1 (TOURNIQUET CUFF) ×1 IMPLANT
DRAPE EXTREMITY T 121X128X90 (DRAPE) ×2 IMPLANT
DRAPE POUCH INSTRU U-SHP 10X18 (DRAPES) ×2 IMPLANT
DRAPE U-SHAPE 47X51 STRL (DRAPES) ×2 IMPLANT
DRSG ADAPTIC 3X8 NADH LF (GAUZE/BANDAGES/DRESSINGS) ×2 IMPLANT
DRSG PAD ABDOMINAL 8X10 ST (GAUZE/BANDAGES/DRESSINGS) ×2 IMPLANT
DURAPREP 26ML APPLICATOR (WOUND CARE) ×2 IMPLANT
ELECT REM PT RETURN 9FT ADLT (ELECTROSURGICAL) ×2
ELECTRODE REM PT RTRN 9FT ADLT (ELECTROSURGICAL) ×1 IMPLANT
EVACUATOR 1/8 PVC DRAIN (DRAIN) ×2 IMPLANT
FACESHIELD LNG OPTICON STERILE (SAFETY) ×10 IMPLANT
GLOVE BIO SURGEON STRL SZ7.5 (GLOVE) ×2 IMPLANT
GLOVE BIO SURGEON STRL SZ8 (GLOVE) ×2 IMPLANT
GLOVE BIOGEL PI IND STRL 8 (GLOVE) ×2 IMPLANT
GLOVE BIOGEL PI INDICATOR 8 (GLOVE) ×2
GOWN STRL NON-REIN LRG LVL3 (GOWN DISPOSABLE) ×2 IMPLANT
GOWN STRL REIN XL XLG (GOWN DISPOSABLE) ×2 IMPLANT
HANDPIECE INTERPULSE COAX TIP (DISPOSABLE) ×2
IMMOBILIZER KNEE 20 (SOFTGOODS) ×2
IMMOBILIZER KNEE 20 THIGH 36 (SOFTGOODS) ×1 IMPLANT
KIT BASIN OR (CUSTOM PROCEDURE TRAY) ×2 IMPLANT
MANIFOLD NEPTUNE II (INSTRUMENTS) ×2 IMPLANT
NS IRRIG 1000ML POUR BTL (IV SOLUTION) ×2 IMPLANT
PACK TOTAL JOINT (CUSTOM PROCEDURE TRAY) ×2 IMPLANT
PAD ABD 7.5X8 STRL (GAUZE/BANDAGES/DRESSINGS) ×2 IMPLANT
PADDING CAST COTTON 6X4 STRL (CAST SUPPLIES) ×4 IMPLANT
POSITIONER SURGICAL ARM (MISCELLANEOUS) ×2 IMPLANT
SET HNDPC FAN SPRY TIP SCT (DISPOSABLE) ×1 IMPLANT
SPONGE GAUZE 4X4 12PLY (GAUZE/BANDAGES/DRESSINGS) ×2 IMPLANT
STRIP CLOSURE SKIN 1/2X4 (GAUZE/BANDAGES/DRESSINGS) ×2 IMPLANT
SUCTION FRAZIER 12FR DISP (SUCTIONS) ×2 IMPLANT
SUT MNCRL AB 4-0 PS2 18 (SUTURE) ×2 IMPLANT
SUT PDS AB 1 CT1 27 (SUTURE) ×6 IMPLANT
SUT VIC AB 2-0 CT1 27 (SUTURE) ×3
SUT VIC AB 2-0 CT1 TAPERPNT 27 (SUTURE) ×3 IMPLANT
SUT VLOC 180 0 24IN GS25 (SUTURE) ×2 IMPLANT
TOWEL OR 17X26 10 PK STRL BLUE (TOWEL DISPOSABLE) ×4 IMPLANT
TRAY FOLEY CATH 14FRSI W/METER (CATHETERS) ×2 IMPLANT
WATER STERILE IRR 1500ML POUR (IV SOLUTION) ×2 IMPLANT
WRAP KNEE MAXI GEL POST OP (GAUZE/BANDAGES/DRESSINGS) ×2 IMPLANT

## 2011-11-03 NOTE — Preoperative (Signed)
Beta Blockers   Reason not to administer Beta Blockers:Not Applicable 

## 2011-11-03 NOTE — Anesthesia Preprocedure Evaluation (Signed)
Anesthesia Evaluation  Patient identified by MRN, date of birth, ID band Patient awake    Reviewed: Allergy & Precautions, H&P , NPO status , Patient's Chart, lab work & pertinent test results, reviewed documented beta blocker date and time   Airway Mallampati: III TM Distance: >3 FB Neck ROM: Full    Dental  (+) Upper Dentures   Pulmonary neg pulmonary ROS,  breath sounds clear to auscultation  + decreased breath sounds      Cardiovascular negative cardio ROS  Rhythm:Regular Rate:Normal  Denies cardiac symptoms   Neuro/Psych negative neurological ROS  negative psych ROS   GI/Hepatic negative GI ROS, Neg liver ROS,   Endo/Other  Morbid obesity  Renal/GU negative Renal ROS  negative genitourinary   Musculoskeletal   Abdominal   Peds negative pediatric ROS (+)  Hematology negative hematology ROS (+)   Anesthesia Other Findings   Reproductive/Obstetrics negative OB ROS                           Anesthesia Physical Anesthesia Plan  ASA: III  Anesthesia Plan: Spinal   Post-op Pain Management:    Induction: Intravenous  Airway Management Planned: Mask  Additional Equipment:   Intra-op Plan:   Post-operative Plan:   Informed Consent: I have reviewed the patients History and Physical, chart, labs and discussed the procedure including the risks, benefits and alternatives for the proposed anesthesia with the patient or authorized representative who has indicated his/her understanding and acceptance.   Dental advisory given  Plan Discussed with: CRNA and Surgeon  Anesthesia Plan Comments:         Anesthesia Quick Evaluation

## 2011-11-03 NOTE — Plan of Care (Signed)
Problem: Consults Goal: Diagnosis- Total Joint Replacement Outcome: Completed/Met Date Met:  11/03/11 Primary Total Knee

## 2011-11-03 NOTE — Anesthesia Procedure Notes (Signed)
Spinal  Patient location during procedure: OR Start time: 11/03/2011 7:15 AM End time: 11/03/2011 7:25 AM Staffing Anesthesiologist: Lestine Box B Performed by: anesthesiologist  Preanesthetic Checklist Completed: patient identified, site marked, surgical consent, pre-op evaluation, timeout performed, IV checked, risks and benefits discussed and monitors and equipment checked Spinal Block Patient position: sitting Prep: Betadine and site prepped and draped Patient monitoring: continuous pulse ox, heart rate, cardiac monitor and blood pressure Location: L3-4 Injection technique: single-shot Needle Needle type: Quincke  Needle gauge: 25 G Needle length: 10 cm Needle insertion depth: 4 cm Assessment Sensory level: T6 Additional Notes [id number], 2014-09

## 2011-11-03 NOTE — Anesthesia Postprocedure Evaluation (Signed)
  Anesthesia Post-op Note  Patient: Brooke Luna  Procedure(s) Performed: Procedure(s) (LRB): TOTAL KNEE ARTHROPLASTY (Right)  Patient Location: PACU  Anesthesia Type: Spinal  Level of Consciousness: oriented and sedated  Airway and Oxygen Therapy: Patient Spontanous Breathing and Patient connected to nasal cannula oxygen  Post-op Pain: mild  Post-op Assessment: Post-op Vital signs reviewed, Patient's Cardiovascular Status Stable, Respiratory Function Stable and Patent Airway  Post-op Vital Signs: stable  Complications: No apparent anesthesia complications

## 2011-11-03 NOTE — Op Note (Signed)
Pre-operative diagnosis- Osteoarthritis  Right knee(s)  Post-operative diagnosis- Osteoarthritis Right knee(s)  Procedure-  Right  Total Knee Arthroplasty  Surgeon- Gus Rankin. Joory Gough, MD  Assistant- Dimitri Ped, PA-C   Anesthesia-  Spinal EBL-* No blood loss amount entered *  Drains Hemovac  Tourniquet time-  Total Tourniquet Time Documented: Thigh (Right) - 36 minutes   Complications- None  Condition-PACU - hemodynamically stable.   Brief Clinical Note  Brooke Luna is a 66 y.o. year old female with end stage OA of her right knee with progressively worsening pain and dysfunction. She has constant pain, with activity and at rest and significant functional deficits with difficulties even with ADLs. She has had extensive non-op management including analgesics, injections of cortisone , and home exercise program, but remains in significant pain with significant dysfunction.Radiographs show bone on bone arthritis medial and patellofemoral. She presents now for right Total Knee Arthroplasty.    Procedure in detail---   The patient is brought into the operating room and positioned supine on the operating table. After successful administration of  Spinal,   a tourniquet is placed high on the  Right thigh(s) and the lower extremity is prepped and draped in the usual sterile fashion. Time out is performed by the operating team and then the  Right lower extremity is wrapped in Esmarch, knee flexed and the tourniquet inflated to 300 mmHg.       A midline incision is made with a ten blade through the subcutaneous tissue to the level of the extensor mechanism. A fresh blade is used to make a medial parapatellar arthrotomy. Soft tissue over the proximal medial tibia is subperiosteally elevated to the joint line with a knife and into the semimembranosus bursa with a Cobb elevator. Soft tissue over the proximal lateral tibia is elevated with attention being paid to avoiding the patellar tendon on  the tibial tubercle. The patella is everted, knee flexed 90 degrees and the ACL and PCL are removed. Findings are bone on bone medial and patellofemoral with large medial osteophytes.        The drill is used to create a starting hole in the distal femur and the canal is thoroughly irrigated with sterile saline to remove the fatty contents. The 5 degree Right  valgus alignment guide is placed into the femoral canal and the distal femoral cutting block is pinned to remove 11 mm off the distal femur. Resection is made with an oscillating saw.      The tibia is subluxed forward and the menisci are removed. The extramedullary alignment guide is placed referencing proximally at the medial aspect of the tibial tubercle and distally along the second metatarsal axis and tibial crest. The block is pinned to remove 2mm off the more deficient medial  side. Resection is made with an oscillating saw. Size 2.5is the most appropriate size for the tibia and the proximal tibia is prepared with the modular drill and keel punch for that size.      The femoral sizing guide is placed and size 3 is most appropriate. Rotation is marked off the epicondylar axis and confirmed by creating a rectangular flexion gap at 90 degrees. The size 3 cutting block is pinned in this rotation and the anterior, posterior and chamfer cuts are made with the oscillating saw. The intercondylar block is then placed and that cut is made.      Trial size 2.5 tibial component, trial size 3 posterior stabilized femur and a 10  mm posterior  stabilized rotating platform insert trial is placed. Full extension is achieved with excellent varus/valgus and anterior/posterior balance throughout full range of motion. The patella is everted and thickness measured to be 20  mm. Free hand resection is taken to 11 mm, a 35 template is placed, lug holes are drilled, trial patella is placed, and it tracks normally. Osteophytes are removed off the posterior femur with the  trial in place. All trials are removed and the cut bone surfaces prepared with pulsatile lavage. Cement is mixed and once ready for implantation, the size 2.5 tibial implant, size  3 posterior stabilized femoral component, and the size 35 patella are cemented in place and the patella is held with the clamp. The trial insert is placed and the knee held in full extension. All extruded cement is removed and once the cement is hard the permanent 10 mm posterior stabilized rotating platform insert is placed into the tibial tray.      The wound is copiously irrigated with saline solution and the extensor mechanism closed over a hemovac drain with #1 PDS suture. The tourniquet is released for a total tourniquet time of 36  minutes. Flexion against gravity is 140 degrees and the patella tracks normally. Subcutaneous tissue is closed with 2.0 vicryl and subcuticular with running 4.0 Monocryl. The catheter for the Marcaine pain pump is placed and the pump is initiated. The incision is cleaned and dried and steri-strips and a bulky sterile dressing are applied. The limb is placed into a knee immobilizer and the patient is awakened and transported to recovery in stable condition.      Please note that a surgical assistant was a medical necessity for this procedure in order to perform it in a safe and expeditious manner. Surgical assistant was necessary to retract the ligaments and vital neurovascular structures to prevent injury to them and also necessary for proper positioning of the limb to allow for anatomic placement of the prosthesis.   Gus Rankin Reo Portela, MD    11/03/2011, 8:28 AM

## 2011-11-03 NOTE — Transfer of Care (Signed)
Immediate Anesthesia Transfer of Care Note  Patient: Brooke Luna  Procedure(s) Performed: Procedure(s) (LRB): TOTAL KNEE ARTHROPLASTY (Right)  Patient Location: PACU  Anesthesia Type: MAC combined with regional for post-op pain  Level of Consciousness: awake, alert  and oriented  Airway & Oxygen Therapy: Patient Spontanous Breathing and Patient connected to face mask oxygen  Post-op Assessment: Report given to PACU RN and Post -op Vital signs reviewed and stable  Post vital signs: Reviewed and stable  Complications: No apparent anesthesia complications

## 2011-11-03 NOTE — Plan of Care (Signed)
Problem: Consults Goal: Diagnosis- Total Joint Replacement Outcome: Completed/Met Date Met:  11/03/11 Primary Total Knee     

## 2011-11-03 NOTE — H&P (View-Only) (Signed)
Brooke Luna  DOB: 12/06/1945 Married / Language: English / Race: White / Female  Date of Admission:  11/03/2011  Chief complaint:  Right Knee Pain  History of Present Illness The patient is a 66 year old female who comes in for a preoperative History and Physical. The patient is scheduled for a right total knee arthroplasty to be performed by Dr. Frank V. Aluisio, MD at [facility] on 11/03/2011. The patient is a 66 year old female who presents with knee complaints. The patient is seen today initial evaluation of right knee. The patient reports right knee symptoms including: pain and swelling which began year(s) ago without any known injury. The patient describes the severity of the symptoms as severe.The patient feels that the symptoms are worsening. Prior to being seen today the patient was previously evaluated by a colleague 8 year(s) ago. Previous work-up for this problem has included knee x-rays. Past treatment for this problem has included intra-articular injection of corticosteroids (caused severe pain, could not walk on right leg). Symptoms are reported to be located in the right knee and include knee pain and swelling. The patient reports that symptoms radiate to the right thigh and right lower leg. The patient describes their pain as sharp, dull, aching and throbbing. Onset of symptoms was gradual. Symptoms are exacerbated by weight bearing and walking. Recently went to PCP due to severity of pain, was prescribed tramadol, which doesnt help.  The knee hurts at all times. It limits what she can and cannot do. Injections have not helped at all. She is at a stage now where she feels like she needs to have something done to fix this. They have been treated conservatively in the past for the above stated problem and despite conservative measures, they continue to have progressive pain and severe functional limitations and dysfunction. They have failed non-operative  management including home exercise, medications, and injections. It is felt that they would benefit from undergoing total joint replacement. Risks and benefits of the procedure have been discussed with the patient and they elect to proceed with surgery. There are no active contraindications to surgery such as ongoing infection or rapidly progressive neurological disease.  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 Itching - (She has been taking Hydrocodone though) MOBIC. 10/18/2003 MOBIC CAUSES STOMACH IRRITATION   Family History Diabetes Mellitus. grandfather mothers side Rheumatoid Arthritis. child Cerebrovascular Accident. mother and grandmother mothers side Father. Deceased. age 24, car accident Mother. Deceased, Cerebrovascular Accident. age 82   Social History Living situation. live with spouse Marital status. married Number of flights of stairs before winded. less than 1 Drug/Alcohol Rehab (Currently). no Exercise. Exercises never Illicit drug use. no Tobacco use. former smoker; smoke(d) 1 1/2 pack(s) per day Pain Contract. no Previously in rehab. no Tobacco / smoke exposure. no Children. 2 Current work status. retired Alcohol use. current drinker; drinks beer; less than 5 per week Post-Surgical Plans. Plan is to go home.   Medication History Hydrocodone/Acetaminophen ( Oral) Active. Ambien (5MG Tablet, 1/2 Oral at bedtime) Active.   Past Surgical History Other Surgery. Broken right elbow Tubal Ligation   Review of Systems General:Not Present- Chills, Fever, Night Sweats, Fatigue, Weight Gain, Weight Loss and Memory Loss. Skin:Not Present- Hives, Itching, Rash, Eczema and Lesions. HEENT:Not Present- Tinnitus, Headache, Double Vision, Visual Loss, Hearing  Loss and Dentures. Respiratory:Not Present- Shortness of breath with exertion, Shortness   of breath at rest, Allergies, Coughing up blood and Chronic Cough. Cardiovascular:Present- Difficulty Breathing Lying Down (She attributes this to her weight and breasts sliding upward.). Not Present- Chest Pain, Racing/skipping heartbeats, Murmur, Swelling and Palpitations. Gastrointestinal:Present- Bloody Stool (Attribute this to the sensitive hemorrhoids.). Not Present- Heartburn, Abdominal Pain, Vomiting, Nausea, Constipation, Diarrhea, Difficulty Swallowing, Jaundice and Loss of appetitie. Female Genitourinary:Not Present- Blood in Urine, Urinary frequency, Weak urinary stream, Discharge, Flank Pain, Incontinence, Painful Urination, Urgency, Urinary Retention and Urinating at Night. Musculoskeletal:Present- Muscle Pain, Joint Swelling, Joint Pain and Back Pain. Not Present- Muscle Weakness, Morning Stiffness and Spasms. Neurological:Not Present- Tremor, Dizziness, Blackout spells, Paralysis, Difficulty with balance and Weakness. Psychiatric:Not Present- Insomnia.   Vitals Weight: 150 lb Height: 65 in Body Surface Area: 1.77 m Body Mass Index: 24.96 kg/m Pulse: 64 (Regular) Resp.: 14 (Unlabored) BP: 128/78 (Sitting, Right Arm, Standard)    Physical Exam Pateint is a 66 year old female with continued kene pain. Patient is accompanied today by her husband.   General Mental Status - Alert, cooperative and good historian. General Appearance- pleasant. Not in acute distress. Orientation- Oriented X3. Build & Nutrition- Well nourished and Well developed.   Head and Neck Head- normocephalic, atraumatic . Neck Global Assessment- supple. no bruit auscultated on the right and no bruit auscultated on the left.   Eye Pupil- Bilateral- Regular and Round. Motion- Bilateral- EOMI.   ENMT  Upper denture plate  Chest and Lung Exam Auscultation: Breath sounds:-  clear at anterior chest wall and - clear at posterior chest wall. Adventitious sounds:- No Adventitious sounds.   Cardiovascular Auscultation:Rhythm- Regular rate and rhythm. Heart Sounds- S1 WNL and S2 WNL. Murmurs & Other Heart Sounds:Auscultation of the heart reveals - No Murmurs.   Abdomen Inspection:Contour- Generalized moderate distention. Palpation/Percussion:Tenderness- Abdomen is non-tender to palpation. Rigidity (guarding)- Abdomen is soft. Auscultation:Auscultation of the abdomen reveals - Bowel sounds normal.   Female Genitourinary Not done, not pertinent to present illness  Musculoskeletal Her hips show normal range of motion, no discomfort. Left knee - no effusion. Range is 0 to 125. Minimal crepitus on range of motion. No tenderness or instability. Right knee - no effusion, marked crepitus on range of motion, tender medial greater than lateral. No instability noted. Pulse, sensation, and motor intact in both lower extremities. She has slight varus on her right knee. Gait pattern is antalgic on the right .  RADIOGRAPHS: AP and lateral of both knees show that on the left there is minimal degenerative change. On the right, she is bone on bone medial and patellofemoral with some tibial subluxation.  Assessment & Plan Osteoarthritis Right Knee  Note: Patient is for a Right Total Knee Replacement by Dr. Aluisio.  Plan is to go home.  PCP - Dr. Ashish Shah - Patient has been seen by Dr. Shah and felt to be stable for surgery. [facility]  Signed electronically by DREW L Maily Debarge, PA-C 

## 2011-11-03 NOTE — Interval H&P Note (Signed)
History and Physical Interval Note:  11/03/2011 6:56 AM  Brooke Luna  has presented today for surgery, with the diagnosis of osteoarthritis right knee  The various methods of treatment have been discussed with the patient and family. After consideration of risks, benefits and other options for treatment, the patient has consented to  Procedure(s) (LRB): TOTAL KNEE ARTHROPLASTY (Right) as a surgical intervention .  The patient's history has been reviewed, patient examined, no change in status, stable for surgery.  I have reviewed the patients' chart and labs.  Questions were answered to the patient's satisfaction.     Loanne Drilling

## 2011-11-03 NOTE — Progress Notes (Signed)
Utilization review completed.  

## 2011-11-04 LAB — BASIC METABOLIC PANEL
CO2: 26 mEq/L (ref 19–32)
Chloride: 101 mEq/L (ref 96–112)
Creatinine, Ser: 0.75 mg/dL (ref 0.50–1.10)
Glucose, Bld: 180 mg/dL — ABNORMAL HIGH (ref 70–99)

## 2011-11-04 LAB — CBC
Hemoglobin: 11.8 g/dL — ABNORMAL LOW (ref 12.0–15.0)
MCV: 84.4 fL (ref 78.0–100.0)
Platelets: 337 10*3/uL (ref 150–400)
RBC: 4.22 MIL/uL (ref 3.87–5.11)
WBC: 14.1 10*3/uL — ABNORMAL HIGH (ref 4.0–10.5)

## 2011-11-04 MED ORDER — HYDROMORPHONE HCL 2 MG PO TABS
2.0000 mg | ORAL_TABLET | ORAL | Status: DC | PRN
  Administered 2011-11-04 (×2): 2 mg via ORAL
  Administered 2011-11-04: 4 mg via ORAL
  Administered 2011-11-04 – 2011-11-06 (×8): 2 mg via ORAL
  Filled 2011-11-04 (×7): qty 1
  Filled 2011-11-04: qty 2
  Filled 2011-11-04: qty 1
  Filled 2011-11-04: qty 2
  Filled 2011-11-04: qty 1

## 2011-11-04 MED ORDER — SODIUM CHLORIDE 0.9 % IV SOLN
INTRAVENOUS | Status: DC
  Administered 2011-11-04: 16:00:00 via INTRAVENOUS

## 2011-11-04 MED ORDER — HYDROMORPHONE HCL PF 1 MG/ML IJ SOLN
0.5000 mg | INTRAMUSCULAR | Status: DC | PRN
  Administered 2011-11-04 (×2): 1 mg via INTRAVENOUS
  Filled 2011-11-04 (×2): qty 1

## 2011-11-04 NOTE — Progress Notes (Signed)
Physical Therapy Treatment Patient Details Name: Brooke Luna MRN: [id number] DOB: 06-Mar-1946 Today's Date: 11/04/2011 Time: 4098-1191 PT Time Calculation (min): 27 min  PT Assessment / Plan / Recommendation Comments on Treatment Session  Pt progressing well this afternoon with ambulation outside of room.  Pt states she still feels somewhat nausous, however was able to ambulate some and participate with exercises.     Follow Up Recommendations  Home health PT    Barriers to Discharge None      Equipment Recommendations  Rolling walker with 5" wheels;3 in 1 bedside comode    Recommendations for Other Services OT consult  Frequency 7X/week   Plan Discharge plan remains appropriate    Precautions / Restrictions Precautions Precautions: Knee Required Braces or Orthoses: Knee Immobilizer - Right Knee Immobilizer - Right: Discontinue once straight leg raise with < 10 degree lag Restrictions Weight Bearing Restrictions: No Other Position/Activity Restrictions: WBAT   Pertinent Vitals/Pain 5/10    Mobility  Bed Mobility Bed Mobility: Sit to Supine Supine to Sit: 1: +2 Total assist;HOB elevated;With rails Supine to Sit: Patient Percentage: 60% Sitting - Scoot to Edge of Bed: 4: Min guard Sit to Supine: 1: +2 Total assist;HOB flat Sit to Supine: Patient Percentage: 60% Details for Bed Mobility Assistance: Assist for RLE into bed with some assist for trunk to ensure safety.  cues for technique.  Transfers Transfers: Sit to Stand;Stand to Sit;Stand Pivot Transfers Sit to Stand: 1: +2 Total assist;With upper extremity assist;With armrests;From chair/3-in-1 Sit to Stand: Patient Percentage: 70% Stand to Sit: 1: +2 Total assist;With upper extremity assist;To bed Stand to Sit: Patient Percentage: 70% Stand Pivot Transfers: 1: +2 Total assist;With armrests Stand Pivot Transfers: Patient Percentage: 60% Details for Transfer Assistance: Pt with improved mobiltiy this afternoon with  min cues for hand placement, esp when sitting on bed and for LE management.  Ambulation/Gait Ambulation/Gait Assistance: 1: +2 Total assist Ambulation/Gait: Patient Percentage: 60% Ambulation Distance (Feet): 40 Feet Assistive device: Rolling walker Ambulation/Gait Assistance Details: Cues for sequencing/technique with RW, for upright posture and to continue pursed lip breathing.   Gait Pattern: Step-to pattern;Decreased stride length;Decreased stance time - right;Decreased step length - left;Antalgic Gait velocity: decreased Stairs: No Wheelchair Mobility Wheelchair Mobility: No    Exercises Total Joint Exercises Ankle Circles/Pumps: AROM;Both;20 reps Quad Sets: AROM;Right;10 reps Heel Slides: AAROM;Right;10 reps Hip ABduction/ADduction: AAROM;Right;10 reps Straight Leg Raises: AAROM;Right;10 reps   PT Diagnosis: Difficulty walking;Abnormality of gait;Generalized weakness;Acute pain  PT Problem List: Decreased strength;Decreased range of motion;Decreased activity tolerance;Decreased balance;Decreased mobility;Decreased knowledge of use of DME;Decreased knowledge of precautions;Pain PT Treatment Interventions: DME instruction;Gait training;Stair training;Functional mobility training;Therapeutic activities;Therapeutic exercise;Balance training;Patient/family education   PT Goals Acute Rehab PT Goals PT Goal Formulation: With patient Time For Goal Achievement: 11/08/11 Potential to Achieve Goals: Good Pt will go Supine/Side to Sit: with supervision PT Goal: Supine/Side to Sit - Progress: Goal set today Pt will go Sit to Supine/Side: with supervision PT Goal: Sit to Supine/Side - Progress: Progressing toward goal Pt will go Sit to Stand: with supervision PT Goal: Sit to Stand - Progress: Progressing toward goal Pt will go Stand to Sit: with supervision PT Goal: Stand to Sit - Progress: Progressing toward goal Pt will Ambulate: 51 - 150 feet;with supervision;with least restrictive  assistive device PT Goal: Ambulate - Progress: Progressing toward goal Pt will Go Up / Down Stairs: 3-5 stairs;with min assist;with least restrictive assistive device PT Goal: Up/Down Stairs - Progress: Goal set today  Visit Information  Last PT Received On: 11/04/11 Assistance Needed: +2    Subjective Data  Subjective: I still have that nauseous feeling Patient Stated Goal: to get back home.    Cognition  Overall Cognitive Status: Appears within functional limits for tasks assessed/performed Arousal/Alertness: Awake/alert Orientation Level: Appears intact for tasks assessed Behavior During Session: [facility] for tasks performed    Balance     End of Session PT - End of Session Equipment Utilized During Treatment: Right knee immobilizer Activity Tolerance: Patient limited by fatigue Patient left: in bed;with call bell/phone within reach;with family/visitor present Nurse Communication: Mobility status   GP     Page, Meribeth Mattes 11/04/2011, 3:30 PM

## 2011-11-04 NOTE — Evaluation (Signed)
Physical Therapy Evaluation Patient Details Name: Brooke Luna MRN: [id number] DOB: [birth year] Today's Date: 11/04/2011 Time: 1023-1100 PT Time Calculation (min): 37 min  PT Assessment / Plan / Recommendation Clinical Impression  Pt presents R TKA POD 1 with decreased strength, ROM, and mobility.  Tolerated sitting EOB, standing and some steps from bed to chair, however pt became dizzy and nauseated.  Pt able to get to recliner.  BP checked before standing and after steps and was stable throughout.  Pt will benefit from skilled PT in acute venue to address deficits. PT recommends HHPT for follow up therapy at D/C.      PT Assessment  Patient needs continued PT services    Follow Up Recommendations  Home health PT    Barriers to Discharge None      Equipment Recommendations  Rolling walker with 5" wheels;3 in 1 bedside comode    Recommendations for Other Services OT consult   Frequency 7X/week    Precautions / Restrictions Precautions Precautions: Knee Required Braces or Orthoses: Knee Immobilizer - Right Knee Immobilizer - Right: Discontinue once straight leg raise with < 10 degree lag Restrictions Weight Bearing Restrictions: No Other Position/Activity Restrictions: WBAT   Pertinent Vitals/Pain 2/10      Mobility  Bed Mobility Bed Mobility: Supine to Sit;Sitting - Scoot to Edge of Bed Supine to Sit: 1: +2 Total assist;HOB elevated;With rails Supine to Sit: Patient Percentage: 60% Sitting - Scoot to Edge of Bed: 4: Min guard Details for Bed Mobility Assistance: +2 assist for safety with assist for RLE off of bed and cues for hand placement to self assist trunk.   Transfers Transfers: Sit to Stand;Stand to Sit;Stand Pivot Transfers Sit to Stand: 1: +2 Total assist;From elevated surface;With upper extremity assist;From bed Sit to Stand: Patient Percentage: 60% Stand to Sit: 1: +2 Total assist;With upper extremity assist;With armrests;To chair/3-in-1 Stand to Sit:  Patient Percentage: 70% Stand Pivot Transfers: 1: +2 Total assist;With armrests Stand Pivot Transfers: Patient Percentage: 60% Details for Transfer Assistance: Assist to rise and steady with cues for hand placement and LE management when sitting/standing.  Took some steps from bed to chair.  Deferred further ambulation due to pt with dizziness and nausea.  cues for sequencing/technique with RW and for upright posture.  Ambulation/Gait Ambulation/Gait Assistance: 1: +2 Total assist Ambulation/Gait: Patient Percentage: 60% Ambulation Distance (Feet): 4 Feet Assistive device: Rolling walker Ambulation/Gait Assistance Details: cues for sequencing/technique with RW and for upright posture.   Gait Pattern: Step-to pattern;Decreased stride length;Decreased stance time - right;Decreased step length - left;Antalgic Gait velocity: decreased Stairs: No Wheelchair Mobility Wheelchair Mobility: No    Exercises     PT Diagnosis: Difficulty walking;Abnormality of gait;Generalized weakness;Acute pain  PT Problem List: Decreased strength;Decreased range of motion;Decreased activity tolerance;Decreased balance;Decreased mobility;Decreased knowledge of use of DME;Decreased knowledge of precautions;Pain PT Treatment Interventions: DME instruction;Gait training;Stair training;Functional mobility training;Therapeutic activities;Therapeutic exercise;Balance training;Patient/family education   PT Goals Acute Rehab PT Goals PT Goal Formulation: With patient Time For Goal Achievement: 11/08/11 Potential to Achieve Goals: Good Pt will go Supine/Side to Sit: with supervision PT Goal: Supine/Side to Sit - Progress: Goal set today Pt will go Sit to Supine/Side: with supervision PT Goal: Sit to Supine/Side - Progress: Goal set today Pt will go Sit to Stand: with supervision PT Goal: Sit to Stand - Progress: Goal set today Pt will go Stand to Sit: with supervision PT Goal: Stand to Sit - Progress: Goal set  today Pt will Ambulate: 51 -  150 feet;with supervision;with least restrictive assistive device PT Goal: Ambulate - Progress: Goal set today Pt will Go Up / Down Stairs: 3-5 stairs;with min assist;with least restrictive assistive device PT Goal: Up/Down Stairs - Progress: Goal set today  Visit Information  Last PT Received On: 11/04/11 Assistance Needed: +2 (for safety)    Subjective Data  Subjective: i've been expecting you Patient Stated Goal: to get back home.    Prior Functioning  Home Living Lives With: Spouse Available Help at Discharge: Family Type of Home: House Home Access: Stairs to enter Secretary/administrator of Steps: 3-4 Entrance Stairs-Rails: None Home Layout: One level Bathroom Shower/Tub: Health visitor: Handicapped height Bathroom Accessibility: Yes How Accessible: Accessible via walker Home Adaptive Equipment: None Prior Function Level of Independence: Independent Able to Take Stairs?: Yes Driving: Yes Vocation: Retired Musician: No difficulties    Cognition  Overall Cognitive Status: Appears within functional limits for tasks assessed/performed Arousal/Alertness: Awake/alert Orientation Level: Appears intact for tasks assessed Behavior During Session: [facility] for tasks performed    Extremity/Trunk Assessment Right Lower Extremity Assessment RLE ROM/Strength/Tone: Deficits RLE ROM/Strength/Tone Deficits: unable to perform SLR without assist, ankle motions WFL, pt with increased pain and unable to test knee flex.   RLE Sensation: WFL - Light Touch RLE Coordination: WFL - gross motor Left Lower Extremity Assessment LLE ROM/Strength/Tone: WFL for tasks assessed LLE Sensation: WFL - Proprioception LLE Coordination: WFL - gross motor Trunk Assessment Trunk Assessment: Kyphotic   Balance    End of Session PT - End of Session Equipment Utilized During Treatment: Gait belt;Right knee immobilizer Activity Tolerance:  Patient limited by fatigue;Patient limited by pain;Other (comment) (Limited by dizziness/nausea) Patient left: in chair;with call bell/phone within reach;with family/visitor present Nurse Communication: Mobility status  GP     Page, Meribeth Mattes 11/04/2011, 11:51 AM

## 2011-11-04 NOTE — Care Management Note (Unsigned)
    Page 1 of 2   11/04/2011     3:13:39 PM   CARE MANAGEMENT NOTE 11/04/2011  Patient:  Brooke Luna, Brooke Luna   Account Number:  [id number]  Date Initiated:  11/04/2011  Documentation initiated by:  Colleen Can  Subjective/Objective Assessment:   dx osteoarthritis rt knee; total knee replacemnt  Doctor's office referred to [place]     Action/Plan:   CM spoke with patient and spouse. Plans are for patient to return to her home in [place] [place] [facility] where spouse will be caregiver. Pt states she will need RW, 3n1 and con't motion machine. There are currently no others for cpm-will f/u   Anticipated DC Date:  11/05/2011   Anticipated DC Plan:  HOME W HOME HEALTH SERVICES  In-house referral  NA      DC Planning Services  CM consult      [facility] Choice  HOME HEALTH  DURABLE MEDICAL EQUIPMENT   Choice offered to / List presented to:  C-1 Patient   DME arranged  NA      DME agency  NA     HH arranged  HH-2 PT      [facility] agency  [facility]   Status of service:  In process, will continue to follow Medicare Important Message given?   (If response is "NO", the following Medicare IM given date fields will be blank) Date Medicare IM given:   Date Additional Medicare IM given:    Discharge Disposition:    Per UR Regulation:    If discussed at Long Length of Stay Meetings, dates discussed:    Comments:  11/04/2011 Raynelle Bring BSN CCM [phone] Pt wants [facility]. They will provide HHpt amd arrange for DME that is orderd. [employer] notified of pt's request for CPM and  advised the  Pt states she does not want 3N1 if insurance does not cover.

## 2011-11-04 NOTE — Progress Notes (Signed)
   Subjective: 1 Day Post-Op Procedure(s) (LRB): TOTAL KNEE ARTHROPLASTY (Right) Patient reports pain as mild.   Patient seen in rounds with Dr. Lequita Halt. She has had some intermittent nausea. Patient is well, but has had some minor complaints of nausea. We will start therapy today.  Plan is to go Home after hospital stay.  Objective: Vital signs in last 24 hours: Temp:  [97.6 F (36.4 C)-98.6 F (37 C)] 98.6 F (37 C) (07/09 0559) Pulse Rate:  [66-92] 69  (07/09 0559) Resp:  [10-16] 12  (07/09 0738) BP: (103-150)/(63-86) 121/67 mmHg (07/09 0559) SpO2:  [93 %-100 %] 97 % (07/09 0738) Weight:  [108.863 kg (240 lb)] 108.863 kg (240 lb) (07/08 1110)  Intake/Output from previous day:  Intake/Output Summary (Last 24 hours) at 11/04/11 0914 Last data filed at 11/04/11 0700  Gross per 24 hour  Intake 3116.25 ml  Output   2675 ml  Net 441.25 ml    Intake/Output this shift: UOP 1400  Labs:  Basename 11/04/11 0441  HGB 11.8*    Basename 11/04/11 0441  WBC 14.1*  RBC 4.22  HCT 35.6*  PLT 337    Basename 11/04/11 0441  NA 136  K 4.2  CL 101  CO2 26  BUN 8  CREATININE 0.75  GLUCOSE 180*  CALCIUM 8.6   No results found for this basename: LABPT:2,INR:2 in the last 72 hours  EXAM General - Patient is Alert, Appropriate and Oriented Extremity - Neurovascular intact Sensation intact distally Dorsiflexion/Plantar flexion intact Dressing - dressing C/D/I Motor Function - intact, moving foot and toes well on exam.  Hemovac pulled without difficulty.  Past Medical History  Diagnosis Date  . Arthritis     pain and oa  right knee and pain in rt leg and foot--also pain left knee-but right is worse  . GERD (gastroesophageal reflux disease)     diet control  . Hemorrhoids   . Diverticulosis   . Contact dermatitis     underside of left leg--always in the same spots--comes and goes  . Back pain     lumbar bulging disk    Assessment/Plan: 1 Day Post-Op Procedure(s)  (LRB): TOTAL KNEE ARTHROPLASTY (Right) Principal Problem:  *OA (osteoarthritis) of knee   Advance diet Up with therapy Continue foley due to strict I&O and urinary output monitoring Discharge home with home health  DVT Prophylaxis - Xarelto Weight-Bearing as tolerated to right leg Keep foley until tomorrow. No vaccines. D/C O2 and Pulse OX and try on Room Air  Welton Bord, Marlowe Sax 11/04/2011, 9:14 AM

## 2011-11-05 ENCOUNTER — Encounter (HOSPITAL_COMMUNITY): Payer: Self-pay | Admitting: Orthopedic Surgery

## 2011-11-05 DIAGNOSIS — E871 Hypo-osmolality and hyponatremia: Secondary | ICD-10-CM | POA: Diagnosis not present

## 2011-11-05 LAB — CBC
HCT: 33.9 % — ABNORMAL LOW (ref 36.0–46.0)
MCV: 85 fL (ref 78.0–100.0)
RBC: 3.99 MIL/uL (ref 3.87–5.11)
RDW: 14.2 % (ref 11.5–15.5)
WBC: 14.5 10*3/uL — ABNORMAL HIGH (ref 4.0–10.5)

## 2011-11-05 LAB — BASIC METABOLIC PANEL
BUN: 8 mg/dL (ref 6–23)
CO2: 27 mEq/L (ref 19–32)
Chloride: 95 mEq/L — ABNORMAL LOW (ref 96–112)
Creatinine, Ser: 0.66 mg/dL (ref 0.50–1.10)

## 2011-11-05 LAB — GLUCOSE, CAPILLARY: Glucose-Capillary: 126 mg/dL — ABNORMAL HIGH (ref 70–99)

## 2011-11-05 NOTE — Progress Notes (Signed)
Occupational Therapy Note Chart reviewed. Tried to see pt X2 for OT eval. First time, pt reports just getting back to bed. Second attempt, pt states she is really tired and would like to stay in chair for awhile. She requests that OT check back in am for eval. Judithann Sauger OTR/L [phone] 11/05/2011

## 2011-11-05 NOTE — Progress Notes (Signed)
Physical Therapy Treatment Patient Details Name: LASHYA PASSE MRN: [id number] DOB: 11-Mar-1946 Today's Date: 11/05/2011 Time: 4098-1191 PT Time Calculation (min): 16 min  PT Assessment / Plan / Recommendation Comments on Treatment Session  Pt feeling better this morning and ambulated into hallway.    Follow Up Recommendations  Home health PT    Barriers to Discharge        Equipment Recommendations  Rolling walker with 5" wheels;3 in 1 bedside comode    Recommendations for Other Services    Frequency     Plan Discharge plan remains appropriate;Frequency remains appropriate    Precautions / Restrictions Precautions Precautions: Knee Required Braces or Orthoses: Knee Immobilizer - Right Knee Immobilizer - Right: Discontinue once straight leg raise with < 10 degree lag Restrictions Other Position/Activity Restrictions: WBAT   Pertinent Vitals/Pain 3/10 R knee pain with ambulation, RN notified, ice applied    Mobility  Bed Mobility Bed Mobility: Supine to Sit;Sit to Supine Supine to Sit: 4: Min assist;HOB elevated;With rails Sit to Supine: 4: Min assist;HOB elevated;With rail Details for Bed Mobility Assistance: assist for R LE onto and off of bed, verbal cues for technique Transfers Transfers: Sit to Stand;Stand to Sit Sit to Stand: 4: Min assist;With upper extremity assist;From bed;From elevated surface Stand to Sit: 4: Min guard;To bed;To elevated surface Details for Transfer Assistance: verbal cues for hand placement and R LE forward Ambulation/Gait Ambulation/Gait Assistance: 4: Min guard Ambulation Distance (Feet): 40 Feet Assistive device: Rolling walker Ambulation/Gait Assistance Details: verbal cues for sequence, pt denies dizziness Gait Pattern: Step-to pattern;Antalgic Gait velocity: decreased    Exercises     PT Diagnosis:    PT Problem List:   PT Treatment Interventions:     PT Goals Acute Rehab PT Goals PT Goal: Supine/Side to Sit - Progress:  Progressing toward goal PT Goal: Sit to Supine/Side - Progress: Progressing toward goal PT Goal: Sit to Stand - Progress: Progressing toward goal PT Goal: Stand to Sit - Progress: Progressing toward goal PT Goal: Ambulate - Progress: Progressing toward goal  Visit Information  Last PT Received On: 11/05/11 Assistance Needed: +1    Subjective Data  Subjective: "that's rough kati, kati"  mobility   Cognition  Overall Cognitive Status: Appears within functional limits for tasks assessed/performed    Balance     End of Session PT - End of Session Equipment Utilized During Treatment: Right knee immobilizer;Gait belt Activity Tolerance: Patient limited by fatigue;Patient limited by pain Patient left: in bed;with call bell/phone within reach Nurse Communication: Patient requests pain meds   GP     Akari Crysler,KATHrine E 11/05/2011, 12:03 PM Pager: [phone]

## 2011-11-05 NOTE — Progress Notes (Addendum)
Physical Therapy Treatment Patient Details Name: Brooke Luna MRN: [id number] DOB: [birth year] Today's Date: 11/05/2011 Time: 4098-1191 PT Time Calculation (min): 9 min  PT Assessment / Plan / Recommendation Comments on Treatment Session  Pt performed exercises in chair.  Pt did not wish to ambulate again 2* to just getting up to use the bathroom prior to arrival.    Follow Up Recommendations  Home health PT    Barriers to Discharge        Equipment Recommendations  Rolling walker with 5" wheels;3 in 1 bedside comode    Recommendations for Other Services    Frequency     Plan Discharge plan remains appropriate;Frequency remains appropriate    Precautions / Restrictions Precautions Precautions: Knee Required Braces or Orthoses: Knee Immobilizer - Right Knee Immobilizer - Right: Discontinue once straight leg raise with < 10 degree lag Restrictions Other Position/Activity Restrictions: WBAT   Pertinent Vitals/Pain 4/10 R knee pain, pt reports not time for pain meds yet, applied ice    Mobility     Exercises Total Joint Exercises Ankle Circles/Pumps: AROM;Both;20 reps Quad Sets: AROM;Both;20 reps;Supine Short Arc Quad: AAROM;Strengthening;Right;10 reps Heel Slides: AAROM;Right;10 reps Hip ABduction/ADduction: AAROM;Right;10 reps   PT Diagnosis:    PT Problem List:   PT Treatment Interventions:     PT Goals Acute Rehab PT Goals PT Goal: Supine/Side to Sit - Progress: Progressing toward goal PT Goal: Sit to Supine/Side - Progress: Progressing toward goal PT Goal: Sit to Stand - Progress: Progressing toward goal PT Goal: Stand to Sit - Progress: Progressing toward goal PT Goal: Ambulate - Progress: Progressing toward goal  Visit Information  Last PT Received On: 11/05/11 Assistance Needed: +1    Subjective Data  Subjective: "I just went to the bathroom."   Cognition  Overall Cognitive Status: Appears within functional limits for tasks assessed/performed      Balance     End of Session PT - End of Session Equipment Utilized During Treatment: Right knee immobilizer Activity Tolerance: Patient tolerated treatment well Patient left: in chair;with call bell/phone within reach Nurse Communication: Patient requests pain meds   GP     Rashema Seawright,KATHrine E 11/05/2011, 1:52 PM Pager: [phone]

## 2011-11-05 NOTE — Progress Notes (Signed)
   Subjective: 2 Days Post-Op Procedure(s) (LRB): TOTAL KNEE ARTHROPLASTY (Right) Patient reports pain as mild.  He feels better today. Patient seen in rounds with Dr. Lequita Halt. Patient is well, and has had no acute complaints or problems Plan is to go Home after hospital stay.  Objective: Vital signs in last 24 hours: Temp:  [97.7 F (36.5 C)-99.2 F (37.3 C)] 98.5 F (36.9 C) (07/10 0522) Pulse Rate:  [68-84] 82  (07/10 0522) Resp:  [12-16] 14  (07/10 1141) BP: (117-128)/(72-79) 117/73 mmHg (07/10 0522) SpO2:  [90 %-98 %] 90 % (07/10 0522)  Intake/Output from previous day:  Intake/Output Summary (Last 24 hours) at 11/05/11 1147 Last data filed at 11/05/11 1017  Gross per 24 hour  Intake 1885.84 ml  Output   2550 ml  Net -664.16 ml    Intake/Output this shift: Total I/O In: 240 [P.O.:240] Out: 1200 [Urine:1200]  Labs:  [facility] 11/05/11 0429 11/04/11 0441  HGB 11.0* 11.8*    Basename 11/05/11 0429 11/04/11 0441  WBC 14.5* 14.1*  RBC 3.99 4.22  HCT 33.9* 35.6*  PLT 325 337    Basename 11/05/11 0429 11/04/11 0441  NA 131* 136  K 4.1 4.2  CL 95* 101  CO2 27 26  BUN 8 8  CREATININE 0.66 0.75  GLUCOSE 152* 180*  CALCIUM 8.4 8.6   No results found for this basename: LABPT:2,INR:2 in the last 72 hours  EXAM General - Patient is Alert, Appropriate and Oriented Extremity - Neurovascular intact Sensation intact distally No cellulitis present Dressing/Incision - clean, dry, no drainage, healing Motor Function - intact, moving foot and toes well on exam.   Past Medical History  Diagnosis Date  . Arthritis     pain and oa  right knee and pain in rt leg and foot--also pain left knee-but right is worse  . GERD (gastroesophageal reflux disease)     diet control  . Hemorrhoids   . Diverticulosis   . Contact dermatitis     underside of left leg--always in the same spots--comes and goes  . Back pain     lumbar bulging disk    Assessment/Plan: 2 Days  Post-Op Procedure(s) (LRB): TOTAL KNEE ARTHROPLASTY (Right) Principal Problem:  *OA (osteoarthritis) of knee Active Problems:  Postop Hyponatremia   Up with therapy Plan for discharge tomorrow Discharge home with home health  DVT Prophylaxis - Xarelto Weight-Bearing as tolerated to right leg  PERKINS, ALEXZANDREW 11/05/2011, 11:47 AM

## 2011-11-06 LAB — CBC
MCH: 27.9 pg (ref 26.0–34.0)
Platelets: 318 10*3/uL (ref 150–400)
RBC: 3.8 MIL/uL — ABNORMAL LOW (ref 3.87–5.11)
WBC: 13.5 10*3/uL — ABNORMAL HIGH (ref 4.0–10.5)

## 2011-11-06 LAB — BASIC METABOLIC PANEL
CO2: 30 mEq/L (ref 19–32)
Calcium: 8.4 mg/dL (ref 8.4–10.5)
GFR calc Af Amer: >90 mL/min (ref >90)
Sodium: 131 mEq/L — ABNORMAL LOW (ref 135–145)

## 2011-11-06 MED ORDER — METHOCARBAMOL 500 MG PO TABS: 500.0000 mg | ORAL_TABLET | Freq: Four times a day (QID) | ORAL | Status: AC | PRN

## 2011-11-06 MED ORDER — TRAMADOL HCL 50 MG PO TABS: 50.0000 mg | ORAL_TABLET | Freq: Four times a day (QID) | ORAL | Status: AC | PRN

## 2011-11-06 MED ORDER — HYDROMORPHONE HCL 2 MG PO TABS: 2.0000 mg | ORAL_TABLET | ORAL | Status: AC | PRN

## 2011-11-06 MED ORDER — RIVAROXABAN 10 MG PO TABS: 10.0000 mg | ORAL_TABLET | Freq: Every day | ORAL | Status: DC

## 2011-11-06 NOTE — Discharge Summary (Signed)
Physician Discharge Summary   Patient ID: Brooke Luna MRN: [id number] DOB/AGE: 1945-08-29 66 y.o.  Admit date: 11/03/2011 Discharge date: 11/06/2011  Primary Diagnosis: Osteoarthritis Right Knee   Admission Diagnoses:  Past Medical History  Diagnosis Date  . Arthritis     pain and oa  right knee and pain in rt leg and foot--also pain left knee-but right is worse  . GERD (gastroesophageal reflux disease)     diet control  . Hemorrhoids   . Diverticulosis   . Contact dermatitis     underside of left leg--always in the same spots--comes and goes  . Back pain     lumbar bulging disk   Discharge Diagnoses:   Principal Problem:  *OA (osteoarthritis) of knee Active Problems:  Postop Hyponatremia  Procedure:  Procedure(s) (LRB): TOTAL KNEE ARTHROPLASTY (Right)   Consults: None  HPI: Brooke Luna is a 66 y.o. year old female with end stage OA of her right knee with progressively worsening pain and dysfunction. She has constant pain, with activity and at rest and significant functional deficits with difficulties even with ADLs. She has had extensive non-op management including analgesics, injections of cortisone , and home exercise program, but remains in significant pain with significant dysfunction.Radiographs show bone on bone arthritis medial and patellofemoral. She presents now for right Total Knee Arthroplasty.   Laboratory Data: Hospital Outpatient Visit on 10/23/2011  Component Date Value Range Status  . MRSA, PCR 10/23/2011 NEGATIVE  NEGATIVE Final  . Staphylococcus aureus 10/23/2011 NEGATIVE  NEGATIVE Final   Comment:                                 The Xpert SA Assay (FDA                          approved for NASAL specimens                          only), is one component of                          a comprehensive surveillance                          program.  It is not intended                          to diagnose infection nor to     guide or monitor treatment.  . WBC 10/23/2011 7.6  4.0 - 10.5 K/uL Final  . RBC 10/23/2011 4.88  3.87 - 5.11 MIL/uL Final  . Hemoglobin 10/23/2011 13.6  12.0 - 15.0 g/dL Final  . HCT 14/78/2956 41.5  36.0 - 46.0 % Final  . MCV 10/23/2011 85.0  78.0 - 100.0 fL Final  . MCH 10/23/2011 27.9  26.0 - 34.0 pg Final  . MCHC 10/23/2011 32.8  30.0 - 36.0 g/dL Final  . RDW 21/30/8657 13.8  11.5 - 15.5 % Final  . Platelets 10/23/2011 363  150 - 400 K/uL Final  . Sodium 10/23/2011 141  135 - 145 mEq/L Final  . Potassium 10/23/2011 3.7  3.5 - 5.1 mEq/L Final  . Chloride 10/23/2011 104  96 - 112 mEq/L Final  . CO2 10/23/2011 26  19 - 32  mEq/L Final  . Glucose, Bld 10/23/2011 96  70 - 99 mg/dL Final  . BUN 11/91/4782 10  6 - 23 mg/dL Final  . Creatinine, Ser 10/23/2011 0.82  0.50 - 1.10 mg/dL Final  . Calcium 95/62/1308 9.3  8.4 - 10.5 mg/dL Final  . Total Protein 10/23/2011 7.1  6.0 - 8.3 g/dL Final  . Albumin 65/78/4696 4.0  3.5 - 5.2 g/dL Final  . AST 29/52/8413 18  0 - 37 U/L Final  . ALT 10/23/2011 23  0 - 35 U/L Final  . Alkaline Phosphatase 10/23/2011 75  39 - 117 U/L Final  . Total Bilirubin 10/23/2011 0.6  0.3 - 1.2 mg/dL Final  . GFR calc non Af Amer 10/23/2011 73* >90 mL/min Final  . GFR calc Af Amer 10/23/2011 85* >90 mL/min Final   Comment:                                 The eGFR has been calculated                          using the CKD EPI equation.                          This calculation has not been                          validated in all clinical                          situations.                          eGFR's persistently                          <90 mL/min signify                          possible Chronic Kidney Disease.  Marland Kitchen Prothrombin Time 10/23/2011 12.9  11.6 - 15.2 seconds Final  . INR 10/23/2011 0.95  0.00 - 1.49 Final  . aPTT 10/23/2011 32  24 - 37 seconds Final  . Color, Urine 10/23/2011 YELLOW  YELLOW Final  . APPearance 10/23/2011 CLEAR  CLEAR Final  .  Specific Gravity, Urine 10/23/2011 1.021  1.005 - 1.030 Final  . pH 10/23/2011 6.0  5.0 - 8.0 Final  . Glucose, UA 10/23/2011 NEGATIVE  NEGATIVE mg/dL Final  . Hgb urine dipstick 10/23/2011 NEGATIVE  NEGATIVE Final  . Bilirubin Urine 10/23/2011 NEGATIVE  NEGATIVE Final  . Ketones, ur 10/23/2011 NEGATIVE  NEGATIVE mg/dL Final  . Protein, ur 24/40/1027 NEGATIVE  NEGATIVE mg/dL Final  . Urobilinogen, UA 10/23/2011 0.2  0.0 - 1.0 mg/dL Final  . Nitrite 25/36/6440 NEGATIVE  NEGATIVE Final  . Leukocytes, UA 10/23/2011 NEGATIVE  NEGATIVE Final   MICROSCOPIC NOT DONE ON URINES WITH NEGATIVE PROTEIN, BLOOD, LEUKOCYTES, NITRITE, OR GLUCOSE <1000 mg/dL.    Basename 11/06/11 0417 11/05/11 0429 11/04/11 0441  HGB 10.6* 11.0* 11.8*    Basename 11/06/11 0417 11/05/11 0429  WBC 13.5* 14.5*  RBC 3.80* 3.99  HCT 32.6* 33.9*  PLT 318 325    Basename 11/06/11 0417 11/05/11 0429  NA 131* 131*  K  3.8 4.1  CL 96 95*  CO2 30 27  BUN 7 8  CREATININE 0.67 0.66  GLUCOSE 145* 152*  CALCIUM 8.4 8.4   No results found for this basename: LABPT:2,INR:2 in the last 72 hours  X-Rays:No results found.  EKG: Orders placed during the hospital encounter of 10/23/11  . EKG 12-LEAD  . EKG 12-LEAD     Hospital Course: Patient was admitted to [facility] and taken to the OR and underwent the above state procedure without complications.  Patient tolerated the procedure well and was later transferred to the recovery room and then to the orthopaedic floor for postoperative care.  They were given PO and IV analgesics for pain control following their surgery.  They were given 24 hours of postoperative antibiotics and started on DVT prophylaxis in the form of Xarelto.   PT and OT were ordered for total joint protocol.  Discharge planning consulted to help with postop disposition and equipment needs.  Patient had a decent night on the evening of surgery but did have some intermittent nausea. She started to get  up OOB with therapy on day one.  Hemovac drain was pulled without difficulty.  Continued to work with therapy into day two.  Dressing was changed on day two and the incision was healing well and she was feeling better.  By day three, the patient had progressed with therapy and meeting their goals.  Incision was healing well.  Patient was seen in rounds and was ready to go home.  Discharge Medications: Prior to Admission medications   Medication Sig Start Date End Date Taking? Authorizing Provider  clotrimazole-betamethasone (LOTRISONE) cream Apply 1 application topically as needed. Contact dermatitis   Yes Historical Provider, MD  HYDROmorphone (DILAUDID) 2 MG tablet Take 1-2 tablets (2-4 mg total) by mouth every 4 (four) hours as needed. 11/06/11 11/16/11  Maleeya Peterkin, PA  methocarbamol (ROBAXIN) 500 MG tablet Take 1 tablet (500 mg total) by mouth every 6 (six) hours as needed. 11/06/11 11/16/11  Erandy Mceachern, PA  nystatin-triamcinolone (MYCOLOG II) cream Apply 1 application topically 2 (two) times daily. Yeast infections    Historical Provider, MD  rivaroxaban (XARELTO) 10 MG TABS tablet Take 1 tablet (10 mg total) by mouth daily with breakfast. 11/06/11   Eliezer Khawaja Julien Girt, PA  zolpidem (AMBIEN) 10 MG tablet Take 5 mg by mouth at bedtime as needed. sleep    Historical Provider, MD    Diet: Regular diet Activity:WBAT Follow-up:in 2 weeks Disposition - Home Discharged Condition: good   Discharge Orders    Future Orders Please Complete By Expires   Diet general      Call MD / Call 911      Comments:   If you experience chest pain or shortness of breath, CALL 911 and be transported to the hospital emergency room.  If you develope a fever above 101 F, pus (white drainage) or increased drainage or redness at the wound, or calf pain, call your surgeon's office.   Discharge instructions      Comments:   Pick up stool softner and laxative for home. Do not submerge incision under  water. May shower. Continue to use ice for pain and swelling from surgery.  Take Xarelto for two and a half more weeks, then discontinue Xarelto.   Constipation Prevention      Comments:   Drink plenty of fluids.  Prune juice may be helpful.  You may use a stool softener, such as Colace (over the counter)  100 mg twice a day.  Use MiraLax (over the counter) for constipation as needed.   Increase activity slowly as tolerated      Patient may shower      Comments:   You may shower without a dressing once there is no drainage.  Do not wash over the wound.  If drainage remains, do not shower until drainage stops.   Driving restrictions      Comments:   No driving until released by the physician.   Lifting restrictions      Comments:   No lifting until released by the physician.   TED hose      Comments:   Use stockings (TED hose) for 3 weeks on both leg(s).  You may remove them at night for sleeping.   Change dressing      Comments:   Change dressing daily with sterile 4 x 4 inch gauze dressing and apply TED hose. Do not submerge the incision under water.   Do not put a pillow under the knee. Place it under the heel.      Do not sit on low chairs, stoools or toilet seats, as it may be difficult to get up from low surfaces        Medication List  As of 11/06/2011  9:22 AM   STOP taking these medications         HYDROcodone-acetaminophen 5-325 MG per tablet      phenylephrine-shark liver oil-mineral oil-petrolatum 0.25-3-14-71.9 % rectal ointment         TAKE these medications         clotrimazole-betamethasone cream   Commonly known as: LOTRISONE   Apply 1 application topically as needed. Contact dermatitis      HYDROmorphone 2 MG tablet   Commonly known as: DILAUDID   Take 1-2 tablets (2-4 mg total) by mouth every 4 (four) hours as needed.      methocarbamol 500 MG tablet   Commonly known as: ROBAXIN   Take 1 tablet (500 mg total) by mouth every 6 (six) hours as needed.        nystatin-triamcinolone cream   Commonly known as: MYCOLOG II   Apply 1 application topically 2 (two) times daily. Yeast infections      rivaroxaban 10 MG Tabs tablet   Commonly known as: XARELTO   Take 1 tablet (10 mg total) by mouth daily with breakfast.      zolpidem 10 MG tablet   Commonly known as: AMBIEN   Take 5 mg by mouth at bedtime as needed. sleep           Follow-up Information    Follow up with Loanne Drilling, MD. Schedule an appointment as soon as possible for a visit in 2 weeks.   Contact information:   [facility] [address] [place] [address] [phone]          Signed: Patrica Duel 11/06/2011, 9:22 AM

## 2011-11-06 NOTE — Evaluation (Signed)
Occupational Therapy Evaluation Patient Details Name: Brooke Luna MRN: [id number] DOB: 25-Sep-1945 Today's Date: 11/06/2011 Time: 4098-1191 OT Time Calculation (min): 24 min  OT Assessment / Plan / Recommendation Clinical Impression  Pt is s/p R TKA and displays decreased activity tolerance, strength and increased pain. Will benefit from skilled OT services to improve independence with ADL.    OT Assessment  Patient needs continued OT Services    Follow Up Recommendations  No OT follow up;Supervision/Assistance - 24 hour    Barriers to Discharge      Equipment Recommendations  Rolling walker with 5" wheels;3 in 1 bedside comode    Recommendations for Other Services    Frequency  Min 2X/week    Precautions / Restrictions Precautions Precautions: Knee Required Braces or Orthoses: Knee Immobilizer - Right Knee Immobilizer - Right: Discontinue once straight leg raise with < 10 degree lag Restrictions Other Position/Activity Restrictions: WBAT        ADL  Eating/Feeding: Simulated;Independent Where Assessed - Eating/Feeding: Bed level Grooming: Performed;Wash/dry face;Set up Where Assessed - Grooming: Supine, head of bed up Upper Body Bathing: Simulated;Chest;Right arm;Left arm;Abdomen;Set up Where Assessed - Upper Body Bathing: Unsupported sitting Lower Body Bathing: Simulated;Moderate assistance Where Assessed - Lower Body Bathing: Supported sit to stand Upper Body Dressing: Simulated;Set up Where Assessed - Upper Body Dressing: Unsupported sitting Lower Body Dressing: Simulated;Moderate assistance Where Assessed - Lower Body Dressing: Supported sit to Pharmacist, hospital: Performed;Minimal Dentist Method: Surveyor, minerals: Materials engineer and Hygiene: Simulated;Minimal assistance Where Assessed - Engineer, mining and Hygiene: Standing Tub/Shower Transfer: Other (comment)  (attempted. pt unable to step back with L LE) Equipment Used: Rolling walker ADL Comments: Husband present for session. educated pt and spouse on how to apply KI. Also practiced a toilet transfer and explained how husband is to position himself to assist. Attempted shower transfer but pt unable to step back over a simulated shower ledge currently with L LE first. She states she feels weak and also becoming nauseous. Informed nursing. Pt states she will sponge bathe initially at home. Demonstrated shower transfer technique for both and how to position 3in1 including if RW wont fit into shower.    OT Diagnosis: Generalized weakness  OT Problem List: Decreased strength;Decreased activity tolerance;Decreased knowledge of use of DME or AE OT Treatment Interventions: Self-care/ADL training;Therapeutic activities;Patient/family education;DME and/or AE instruction   OT Goals Acute Rehab OT Goals OT Goal Formulation: With patient/family Time For Goal Achievement: 11/13/11 Potential to Achieve Goals: Good ADL Goals Pt Will Perform Grooming: with supervision;Standing at sink ADL Goal: Grooming - Progress: Goal set today Pt Will Transfer to Toilet: with supervision;Ambulation;3-in-1 ADL Goal: Toilet Transfer - Progress: Goal set today Pt Will Perform Toileting - Clothing Manipulation: with supervision;Standing ADL Goal: Toileting - Clothing Manipulation - Progress: Goal set today Pt Will Perform Tub/Shower Transfer: with min assist;with DME ADL Goal: Tub/Shower Transfer - Progress: Goal set today  Visit Information  Last OT Received On: 11/06/11 Assistance Needed: +1    Subjective Data  Subjective: I can get up now Patient Stated Goal: none stated. agreeable to get up with OT   Prior Functioning  Home Living Lives With: Spouse Available Help at Discharge: Family Type of Home: House Home Access: Stairs to enter [employer] of Steps: 3-4 Entrance Stairs-Rails: None Home Layout:  One level Bathroom Shower/Tub: Health visitor: Handicapped height Bathroom Accessibility: Yes How Accessible: Accessible via walker Home Adaptive Equipment: None  Prior Function Level of Independence: Independent Able to Take Stairs?: Yes Driving: Yes Vocation: Retired Musician: No difficulties    Cognition  Overall Cognitive Status: Appears within functional limits for tasks assessed/performed Arousal/Alertness: Awake/alert Orientation Level: Appears intact for tasks assessed Behavior During Session: [facility] for tasks performed    Extremity/Trunk Assessment Right Upper Extremity Assessment RUE ROM/Strength/Tone: [facility] for tasks assessed Left Upper Extremity Assessment LUE ROM/Strength/Tone: WFL for tasks assessed   Mobility Bed Mobility Bed Mobility: Supine to Sit Supine to Sit: 4: Min assist Details for Bed Mobility Assistance: assist for R LE off bed. Transfers Transfers: Sit to Stand;Stand to Sit Sit to Stand: 4: Min guard;With upper extremity assist;From bed;From chair/3-in-1 Stand to Sit: 4: Min guard;With upper extremity assist;To chair/3-in-1;To bed Details for Transfer Assistance: min verbal cues for hand placement and R LE out in front   Exercise    Balance Balance Balance Assessed: Yes Dynamic Standing Balance Dynamic Standing - Level of Assistance: 4: Min assist  End of Session OT - End of Session Activity Tolerance: Other (comment);Patient limited by fatigue (and nausea) Patient left: in bed;with call bell/phone within reach;with family/visitor present  GO     Lennox Laity [phone] 11/06/2011, 11:05 AM

## 2011-11-06 NOTE — Progress Notes (Signed)
   Subjective: 3 Days Post-Op Procedure(s) (LRB): TOTAL KNEE ARTHROPLASTY (Right) Patient reports pain as minimal..   Patient seen in rounds with Dr. Lequita Halt. Patient is well, and has had no acute complaints or problems Patient is ready to go home.  Objective: Vital signs in last 24 hours: Temp:  [98.2 F (36.8 C)-99.4 F (37.4 C)] 98.2 F (36.8 C) (07/11 0640) Pulse Rate:  [84-107] 91  (07/11 0640) Resp:  [14-17] 16  (07/11 0640) BP: (126-146)/(71-76) 127/71 mmHg (07/11 0640) SpO2:  [91 %-95 %] 95 % (07/11 0640)  Intake/Output from previous day:  Intake/Output Summary (Last 24 hours) at 11/06/11 0859 Last data filed at 11/06/11 0824  Gross per 24 hour  Intake    660 ml  Output   2875 ml  Net  -2215 ml    Intake/Output this shift: Total I/O In: 60 [P.O.:60] Out: -   Labs:  Basename 11/06/11 0417 11/05/11 0429 11/04/11 0441  HGB 10.6* 11.0* 11.8*    Basename 11/06/11 0417 11/05/11 0429  WBC 13.5* 14.5*  RBC 3.80* 3.99  HCT 32.6* 33.9*  PLT 318 325    Basename 11/06/11 0417 11/05/11 0429  NA 131* 131*  K 3.8 4.1  CL 96 95*  CO2 30 27  BUN 7 8  CREATININE 0.67 0.66  GLUCOSE 145* 152*  CALCIUM 8.4 8.4   No results found for this basename: LABPT:2,INR:2 in the last 72 hours  EXAM: General - Patient is Alert, Appropriate and Oriented Extremity - Neurovascular intact Sensation intact distally Dorsiflexion/Plantar flexion intact Incision - clean, dry, no drainage, healing Motor Function - intact, moving foot and toes well on exam.   Assessment/Plan: 3 Days Post-Op Procedure(s) (LRB): TOTAL KNEE ARTHROPLASTY (Right) Procedure(s) (LRB): TOTAL KNEE ARTHROPLASTY (Right) Past Medical History  Diagnosis Date  . Arthritis     pain and oa  right knee and pain in rt leg and foot--also pain left knee-but right is worse  . GERD (gastroesophageal reflux disease)     diet control  . Hemorrhoids   . Diverticulosis   . Contact dermatitis     underside of left  leg--always in the same spots--comes and goes  . Back pain     lumbar bulging disk   Principal Problem:  *OA (osteoarthritis) of knee Active Problems:  Postop Hyponatremia   Up with therapy Discharge home with home health Diet - Regular diet Follow up - in 2 weeks Activity - WBAT Disposition - Home Condition Upon Discharge - Good D/C Meds - See DC Summary DVT Prophylaxis - Xarelto  PERKINS, ALEXZANDREW 11/06/2011, 8:59 AM

## 2011-11-06 NOTE — Progress Notes (Signed)
Physical Therapy Treatment Patient Details Name: YOSSELIN ZOELLER MRN: [id number] DOB: 1945-08-02 Today's Date: 11/06/2011 Time: 1205-1225 PT Time Calculation (min): 20 min  PT Assessment / Plan / Recommendation Comments on Treatment Session  Pt did well with stairs and husband feels confident in his ability to A.  Handouts given    Follow Up Recommendations  Home health PT    Barriers to Discharge        Equipment Recommendations  Rolling walker with 5" wheels;3 in 1 bedside comode    Recommendations for Other Services    Frequency 7X/week   Plan Discharge plan remains appropriate;Frequency remains appropriate    Precautions / Restrictions Precautions Precautions: Knee Required Braces or Orthoses: Knee Immobilizer - Right Knee Immobilizer - Right: Discontinue once straight leg raise with < 10 degree lag Restrictions Other Position/Activity Restrictions: WBAT   Pertinent Vitals/Pain No c/o pain    Mobility  Bed Mobility Bed Mobility: Supine to Sit Supine to Sit: 4: Min assist (A for R LE) Sitting - Scoot to Edge of Bed: 5: Supervision Details for Bed Mobility Assistance: assist for R LE off bed. Transfers Transfers: Sit to Stand;Stand to Sit Sit to Stand: 4: Min guard;With upper extremity assist;From bed;From chair/3-in-1 Stand to Sit: 4: Min guard;With upper extremity assist;To chair/3-in-1;To bed Details for Transfer Assistance: A for hand placement Ambulation/Gait Ambulation/Gait Assistance: 4: Min guard Ambulation Distance (Feet): 25 Feet Assistive device: Rolling walker Ambulation/Gait Assistance Details: good sequencing Gait Pattern: Step-to pattern;Antalgic Gait velocity: decreased Stairs: Yes Stairs Assistance: 4: Min assist Stairs Assistance Details (indicate cue type and reason): husband educated Stair Management Technique: Backwards;With walker Number of Stairs: 2     Exercises Total Joint Exercises Quad Sets: 5 reps;Right;Supine Heel Slides:  AAROM;Right;5 reps;Supine   PT Diagnosis:    PT Problem List:   PT Treatment Interventions:     PT Goals Acute Rehab PT Goals PT Goal: Supine/Side to Sit - Progress: Progressing toward goal PT Goal: Sit to Supine/Side - Progress: Progressing toward goal PT Goal: Sit to Stand - Progress: Progressing toward goal PT Goal: Stand to Sit - Progress: Progressing toward goal PT Goal: Ambulate - Progress: Progressing toward goal PT Goal: Up/Down Stairs - Progress: Met  Visit Information  Last PT Received On: 11/06/11 Assistance Needed: +1    Subjective Data  Subjective: husband present   Cognition  Overall Cognitive Status: Appears within functional limits for tasks assessed/performed Arousal/Alertness: Awake/alert Orientation Level: Appears intact for tasks assessed Behavior During Session: [facility] for tasks performed    Balance  Balance Balance Assessed: Yes Dynamic Standing Balance Dynamic Standing - Level of Assistance: 4: Min assist  End of Session PT - End of Session Equipment Utilized During Treatment: Right knee immobilizer Activity Tolerance: Patient tolerated treatment well Patient left: in chair;with call bell/phone within reach;with family/visitor present Nurse Communication: Mobility status   GP     Evander Macaraeg LUBECK 11/06/2011, 12:45 PM

## 2011-11-07 NOTE — Progress Notes (Signed)
11/06/2011 Raynelle Bring BSN CCM [phone] pt for discharge today/[facility] will start hh services 11/07/2011. there no orders for cpm.

## 2012-09-14 ENCOUNTER — Other Ambulatory Visit: Payer: Self-pay | Admitting: *Deleted

## 2012-09-14 DIAGNOSIS — I83893 Varicose veins of bilateral lower extremities with other complications: Secondary | ICD-10-CM

## 2012-11-05 ENCOUNTER — Encounter: Payer: Medicare Other | Admitting: Vascular Surgery

## 2012-12-16 ENCOUNTER — Encounter: Payer: Self-pay | Admitting: Vascular Surgery

## 2012-12-17 ENCOUNTER — Encounter (INDEPENDENT_AMBULATORY_CARE_PROVIDER_SITE_OTHER): Payer: Medicare Other | Admitting: Vascular Surgery

## 2012-12-17 ENCOUNTER — Ambulatory Visit (INDEPENDENT_AMBULATORY_CARE_PROVIDER_SITE_OTHER): Payer: Medicare Other | Admitting: Vascular Surgery

## 2012-12-17 ENCOUNTER — Encounter: Payer: Self-pay | Admitting: Vascular Surgery

## 2012-12-17 VITALS — BP 134/74 | HR 66 | Ht 65.0 in | Wt 259.0 lb

## 2012-12-17 DIAGNOSIS — I83893 Varicose veins of bilateral lower extremities with other complications: Secondary | ICD-10-CM

## 2012-12-17 DIAGNOSIS — M7989 Other specified soft tissue disorders: Secondary | ICD-10-CM

## 2012-12-17 DIAGNOSIS — M79609 Pain in unspecified limb: Secondary | ICD-10-CM

## 2012-12-17 DIAGNOSIS — I872 Venous insufficiency (chronic) (peripheral): Secondary | ICD-10-CM

## 2012-12-17 NOTE — Progress Notes (Signed)
[facility] [place]  Referred by:  [name], MD [address]  [place], [place] [address]  Reason for referral:Painful L leg  History of Present Illness  Brooke Luna is a 67 y.o. ([birth year]) female who presents with chief complaint: painful L leg.  Patient notes, varicose veins onset years ago.  The patient's symptoms include: pain in both legs, with increase protrusion in vein B, itching and fatigue with swelling in L leg .  The patient has had no history of DVT, known history of pregnancy, known history of varicose vein, no history of venous stasis ulcers, no history of  Lymphedema and no history of skin changes in lower legs.  There is no family history of venous disorders.  The patient has used knee high compression stockings in the past.  Past Medical History  Diagnosis Date  . Arthritis     pain and oa  right knee and pain in rt leg and foot--also pain left knee-but right is worse  . GERD (gastroesophageal reflux disease)     diet control  . Hemorrhoids   . Diverticulosis   . Contact dermatitis     underside of left leg--always in the same spots--comes and goes  . Back pain     lumbar bulging disk  . Atrial fibrillation     Past Surgical History  Procedure Laterality Date  . Surgery for broken right elbow    . Tubal ligation    . Total knee arthroplasty  11/03/2011    Procedure: TOTAL KNEE ARTHROPLASTY;  Surgeon: Loanne Drilling, MD;  Location: [facility];  Service: Orthopedics;  Laterality: Right;    History   Social History  . Marital Status: Married    Spouse Name: N/A    Number of Children: N/A  . Years of Education: N/A   Occupational History  . Not on file.   Social History Main Topics  . Smoking status: Former Smoker    Quit date: 04/28/1996  . Smokeless tobacco: Never Used     Comment: quit smoking 1998  . Alcohol Use: 2.4 oz/week    4 Cans of beer per week     Comment: couple of beers per week  . Drug Use: No  . Sexual Activity:  Not on file   Other Topics Concern  . Not on file   Social History Narrative  . No narrative on file    Family History  Problem Relation Age of Onset  . Diabetes Mother   . Stroke Mother   . Stroke Father     Current Outpatient Prescriptions on File Prior to Visit  Medication Sig Dispense Refill  . zolpidem (AMBIEN) 10 MG tablet Take 5 mg by mouth at bedtime as needed. sleep      . clotrimazole-betamethasone (LOTRISONE) cream Apply 1 application topically as needed. Contact dermatitis      . nystatin-triamcinolone (MYCOLOG II) cream Apply 1 application topically 2 (two) times daily. Yeast infections      . rivaroxaban (XARELTO) 10 MG TABS tablet Take 1 tablet (10 mg total) by mouth daily with breakfast.  18 tablet  0   No current facility-administered medications on file prior to visit.    Allergies  Allergen Reactions  . Codeine Shortness Of Breath and Itching  . Mobic [Meloxicam]     Stomach iritation     REVIEW OF SYSTEMS:  (Positives checked otherwise negative)  CARDIOVASCULAR:  []  chest pain, [x]  chest pressure, [x]  palpitations, [x]  shortness of  breath when laying flat, [x]  shortness of breath with exertion,  [x]  pain in feet when walking, []  pain in feet when laying flat, []  history of blood clot in veins (DVT), []  history of phlebitis, [x]  swelling in legs, [x]  varicose veins  PULMONARY:  []  productive cough, []  asthma, []  wheezing  NEUROLOGIC:  []  weakness in arms or legs, []  numbness in arms or legs, []  difficulty speaking or slurred speech, []  temporary loss of vision in one eye, []  dizziness  HEMATOLOGIC:  []  bleeding problems, []  problems with blood clotting too easily  MUSCULOSKEL:  []  joint pain, []  joint swelling  GASTROINTEST:  []  vomiting blood, []  blood in stool     GENITOURINARY:  []  burning with urination, []  blood in urine  PSYCHIATRIC:  []  history of major depression  INTEGUMENTARY:  [x]  rashes, []  ulcers  CONSTITUTIONAL:  []  fever, []   chills  Physical Examination Filed Vitals:   12/17/12 1400  BP: 134/74  Pulse: 66  Height: 5\' 5"  (1.651 m)  Weight: 259 lb (117.482 kg)  SpO2: 96%   Body mass index is 43.1 kg/(m^2).  General: A&O x 3, WD, morbidly obese  Head: [place]/AT  Ear/Nose/Throat: Hearing grossly intact, nares w/o erythema or drainage, oropharynx w/o Erythema/Exudate  Eyes: PERRLA, EOMI  Neck: Supple, no nuchal rigidity, no palpable LAD  Pulmonary: Sym exp, good air movt, CTAB, no rales, rhonchi, & wheezing  Cardiac: RRR, Nl S1, S2, no Murmurs, rubs or gallops  Vascular: Vessel Right Left  Radial Palpable Palpable  Brachial  Palpable  Palpable  Carotid Palpable, without bruit Palpable, without bruit  Aorta Not palpable N/A  Femoral Palpable Palpable  Popliteal Not palpable Not palpable  PT Palpable Palpable  DP Palpable Palpable   Gastrointestinal: soft, NTND, -G/R, - HSM, - masses, - CVAT B  Musculoskeletal: M/S 5/5 throughout , Extremities without ischemic changes , no LDS B, B prominent varicosities  Neurologic: CN 2-12 intact , Pain and light touch intact in extremities , Motor exam as listed above  Psychiatric: Judgment intact, Mood & affect appropriate for pt's clinical situation  Dermatologic: See M/S exam for extremity exam, no rashes otherwise noted  Lymph : No Cervical, Axillary, or Inguinal lymphadenopathy   Non-Invasive Vascular Imaging  BLE Venous Insufficiency Duplex (Date: 12/17/2012):   RLE: no DVT and SVT, + GSV reflux, + deep venous reflux  LLE: no DVT and SVT, + GSV reflux, + deep venous reflux  Outside Studies/Documentation 4 pages of outside documents were reviewed including: outpatient clinic record.  Medical Decision Making  [name] is a 67 y.o. female who presents with: BLE chronic venous insufficiency (C2).   Based on the patient's history and examination, I recommend: compressive therapy.  I discussed with the patient the use of her 20-30 mm  thigh high compression stockings and need for 3 month trial of such.  The patient will follow up in 3 months with my partners in the [facility] for evaluation for: EVLA L GSV.  Thank you for allowing [place] to participate in this patient's care.  Leonides Sake, MD [facility] Office: [phone] Pager: [phone]  12/17/2012, 2:32 PM

## 2013-03-29 ENCOUNTER — Ambulatory Visit: Payer: Medicare Other | Admitting: Radiation Oncology

## 2013-03-29 ENCOUNTER — Ambulatory Visit: Payer: Medicare Other | Admitting: Vascular Surgery

## 2015-02-02 ENCOUNTER — Other Ambulatory Visit (HOSPITAL_COMMUNITY): Payer: Medicare Other

## 2015-02-05 ENCOUNTER — Ambulatory Visit (HOSPITAL_COMMUNITY): Admission: RE | Admit: 2015-02-05 | Payer: Medicare Other | Source: Ambulatory Visit | Admitting: Ophthalmology

## 2015-02-05 ENCOUNTER — Encounter (HOSPITAL_COMMUNITY): Admission: RE | Payer: Self-pay | Source: Ambulatory Visit

## 2015-02-05 SURGERY — PHACOEMULSIFICATION, CATARACT, WITH IOL INSERTION
Anesthesia: Monitor Anesthesia Care | Site: Eye | Laterality: Left

## 2015-02-16 NOTE — Patient Instructions (Signed)
Your procedure is scheduled on: 02/22/2015   Report to [facility] at  59  AM.  Call this number if you have problems the morning of surgery: [phone]   Do not eat food or drink liquids :After Midnight.      Take these medicines the morning of surgery with A SIP OF WATER: celexa, ultram.   Do not wear jewelry, make-up or nail polish.  Do not wear lotions, powders, or perfumes. You may wear deodorant.  Do not shave 48 hours prior to surgery.  Do not bring valuables to the hospital.  Contacts, dentures or bridgework may not be worn into surgery.  Leave suitcase in the car. After surgery it may be brought to your room.  For patients admitted to the hospital, checkout time is 11:00 AM the day of discharge.   Patients discharged the day of surgery will not be allowed to drive home.  :     Please read over the following fact sheets that you were given: Coughing and Deep Breathing, Surgical Site Infection Prevention, Anesthesia Post-op Instructions and Care and Recovery After Surgery    Cataract A cataract is a clouding of the lens of the eye. When a lens becomes cloudy, vision is reduced based on the degree and nature of the clouding. Many cataracts reduce vision to some degree. Some cataracts make people more near-sighted as they develop. Other cataracts increase glare. Cataracts that are ignored and become worse can sometimes look white. The white color can be seen through the pupil. CAUSES   Aging. However, cataracts may occur at any age, even in newborns.   Certain drugs.   Trauma to the eye.   Certain diseases such as diabetes.   Specific eye diseases such as chronic inflammation inside the eye or a sudden attack of a rare form of glaucoma.   Inherited or acquired medical problems.  SYMPTOMS   Gradual, progressive drop in vision in the affected eye.   Severe, rapid visual loss. This most often happens when trauma is the cause.  DIAGNOSIS  To detect a cataract, an eye  doctor examines the lens. Cataracts are best diagnosed with an exam of the eyes with the pupils enlarged (dilated) by drops.  TREATMENT  For an early cataract, vision may improve by using different eyeglasses or stronger lighting. If that does not help your vision, surgery is the only effective treatment. A cataract needs to be surgically removed when vision loss interferes with your everyday activities, such as driving, reading, or watching TV. A cataract may also have to be removed if it prevents examination or treatment of another eye problem. Surgery removes the cloudy lens and usually replaces it with a substitute lens (intraocular lens, IOL).  At a time when both you and your doctor agree, the cataract will be surgically removed. If you have cataracts in both eyes, only one is usually removed at a time. This allows the operated eye to heal and be out of danger from any possible problems after surgery (such as infection or poor wound healing). In rare cases, a cataract may be doing damage to your eye. In these cases, your caregiver may advise surgical removal right away. The vast majority of people who have cataract surgery have better vision afterward. HOME CARE INSTRUCTIONS  If you are not planning surgery, you may be asked to do the following:  Use different eyeglasses.   Use stronger or brighter lighting.   Ask your eye doctor about reducing your medicine  dose or changing medicines if it is thought that a medicine caused your cataract. Changing medicines does not make the cataract go away on its own.   Become familiar with your surroundings. Poor vision can lead to injury. Avoid bumping into things on the affected side. You are at a higher risk for tripping or falling.   Exercise extreme care when driving or operating machinery.   Wear sunglasses if you are sensitive to bright light or experiencing problems with glare.  SEEK IMMEDIATE MEDICAL CARE IF:   You have a worsening or sudden  vision loss.   You notice redness, swelling, or increasing pain in the eye.   You have a fever.  Document Released: 04/14/2005 Document Revised: 04/03/2011 Document Reviewed: 12/06/2010 [facility] Patient Information 2012 [place].PATIENT INSTRUCTIONS POST-ANESTHESIA  IMMEDIATELY FOLLOWING SURGERY:  Do not drive or operate machinery for the first twenty four hours after surgery.  Do not make any important decisions for twenty four hours after surgery or while taking narcotic pain medications or sedatives.  If you develop intractable nausea and vomiting or a severe headache please notify your doctor immediately.  FOLLOW-UP:  Please make an appointment with your surgeon as instructed. You do not need to follow up with anesthesia unless specifically instructed to do so.  WOUND CARE INSTRUCTIONS (if applicable):  Keep a dry clean dressing on the anesthesia/puncture wound site if there is drainage.  Once the wound has quit draining you may leave it open to air.  Generally you should leave the bandage intact for twenty four hours unless there is drainage.  If the epidural site drains for more than 36-48 hours please call the anesthesia department.  QUESTIONS?:  Please feel free to call your physician or the hospital operator if you have any questions, and they will be happy to assist you.

## 2015-02-19 ENCOUNTER — Other Ambulatory Visit: Payer: Self-pay

## 2015-02-19 ENCOUNTER — Encounter (HOSPITAL_COMMUNITY)
Admission: RE | Admit: 2015-02-19 | Discharge: 2015-02-19 | Disposition: A | Discharge status: Home / Self Care | Payer: Medicare Other | Source: Ambulatory Visit | Attending: Ophthalmology | Admitting: Ophthalmology

## 2015-02-19 ENCOUNTER — Encounter (HOSPITAL_COMMUNITY): Payer: Self-pay

## 2015-02-19 DIAGNOSIS — Z7901 Long term (current) use of anticoagulants: Secondary | ICD-10-CM | POA: Diagnosis not present

## 2015-02-19 DIAGNOSIS — M1990 Unspecified osteoarthritis, unspecified site: Secondary | ICD-10-CM | POA: Diagnosis not present

## 2015-02-19 DIAGNOSIS — K219 Gastro-esophageal reflux disease without esophagitis: Secondary | ICD-10-CM | POA: Diagnosis not present

## 2015-02-19 DIAGNOSIS — I480 Paroxysmal atrial fibrillation: Secondary | ICD-10-CM | POA: Diagnosis not present

## 2015-02-19 DIAGNOSIS — I739 Peripheral vascular disease, unspecified: Secondary | ICD-10-CM | POA: Diagnosis not present

## 2015-02-19 DIAGNOSIS — E669 Obesity, unspecified: Secondary | ICD-10-CM | POA: Diagnosis not present

## 2015-02-19 DIAGNOSIS — F329 Major depressive disorder, single episode, unspecified: Secondary | ICD-10-CM | POA: Diagnosis not present

## 2015-02-19 DIAGNOSIS — I10 Essential (primary) hypertension: Secondary | ICD-10-CM | POA: Diagnosis not present

## 2015-02-19 DIAGNOSIS — H25812 Combined forms of age-related cataract, left eye: Secondary | ICD-10-CM | POA: Diagnosis not present

## 2015-02-19 DIAGNOSIS — Z79899 Other long term (current) drug therapy: Secondary | ICD-10-CM | POA: Diagnosis not present

## 2015-02-19 DIAGNOSIS — H269 Unspecified cataract: Secondary | ICD-10-CM | POA: Diagnosis present

## 2015-02-19 DIAGNOSIS — Z87891 Personal history of nicotine dependence: Secondary | ICD-10-CM | POA: Diagnosis not present

## 2015-02-19 LAB — CBC WITH DIFFERENTIAL/PLATELET
BASOS ABS: 0 10*3/uL (ref 0.0–0.1)
BASOS PCT: 1 %
Eosinophils Absolute: 0.6 10*3/uL (ref 0.0–0.7)
Eosinophils Relative: 7 %
HEMATOCRIT: 42 % (ref 36.0–46.0)
Hemoglobin: 13.4 g/dL (ref 12.0–15.0)
Lymphocytes Relative: 16 %
Lymphs Abs: 1.3 10*3/uL (ref 0.7–4.0)
MCH: 28.3 pg (ref 26.0–34.0)
MCHC: 31.9 g/dL (ref 30.0–36.0)
MCV: 88.6 fL (ref 78.0–100.0)
MONO ABS: 0.6 10*3/uL (ref 0.1–1.0)
Monocytes Relative: 7 %
NEUTROS ABS: 5.7 10*3/uL (ref 1.7–7.7)
NEUTROS PCT: 69 %
Platelets: 344 10*3/uL (ref 150–400)
RBC: 4.74 MIL/uL (ref 3.87–5.11)
RDW: 14 % (ref 11.5–15.5)
WBC: 8.2 10*3/uL (ref 4.0–10.5)

## 2015-02-19 LAB — BASIC METABOLIC PANEL
ANION GAP: 7 (ref 5–15)
BUN: 9 mg/dL (ref 6–20)
CALCIUM: 9 mg/dL (ref 8.9–10.3)
CO2: 27 mmol/L (ref 22–32)
Chloride: 107 mmol/L (ref 101–111)
Creatinine, Ser: 0.68 mg/dL (ref 0.44–1.00)
Glucose, Bld: 116 mg/dL — ABNORMAL HIGH (ref 65–99)
Potassium: 4.1 mmol/L (ref 3.5–5.1)
SODIUM: 141 mmol/L (ref 135–145)

## 2015-02-22 ENCOUNTER — Encounter (HOSPITAL_COMMUNITY)
Admission: RE | Disposition: A | Discharge status: Home / Self Care | Payer: Self-pay | Source: Ambulatory Visit | Attending: Ophthalmology

## 2015-02-22 ENCOUNTER — Ambulatory Visit (HOSPITAL_COMMUNITY): Payer: Medicare Other | Admitting: Anesthesiology

## 2015-02-22 ENCOUNTER — Ambulatory Visit (HOSPITAL_COMMUNITY)
Admission: RE | Admit: 2015-02-22 | Discharge: 2015-02-22 | Disposition: A | Discharge status: Home / Self Care | Payer: Medicare Other | Source: Ambulatory Visit | Attending: Ophthalmology | Admitting: Ophthalmology

## 2015-02-22 ENCOUNTER — Encounter (HOSPITAL_COMMUNITY): Payer: Self-pay | Admitting: *Deleted

## 2015-02-22 DIAGNOSIS — Z79899 Other long term (current) drug therapy: Secondary | ICD-10-CM | POA: Diagnosis not present

## 2015-02-22 DIAGNOSIS — E669 Obesity, unspecified: Secondary | ICD-10-CM | POA: Insufficient documentation

## 2015-02-22 DIAGNOSIS — Z7901 Long term (current) use of anticoagulants: Secondary | ICD-10-CM | POA: Insufficient documentation

## 2015-02-22 DIAGNOSIS — I10 Essential (primary) hypertension: Secondary | ICD-10-CM | POA: Insufficient documentation

## 2015-02-22 DIAGNOSIS — H25812 Combined forms of age-related cataract, left eye: Secondary | ICD-10-CM | POA: Insufficient documentation

## 2015-02-22 DIAGNOSIS — F329 Major depressive disorder, single episode, unspecified: Secondary | ICD-10-CM | POA: Insufficient documentation

## 2015-02-22 DIAGNOSIS — I739 Peripheral vascular disease, unspecified: Secondary | ICD-10-CM | POA: Insufficient documentation

## 2015-02-22 DIAGNOSIS — Z87891 Personal history of nicotine dependence: Secondary | ICD-10-CM | POA: Insufficient documentation

## 2015-02-22 DIAGNOSIS — I480 Paroxysmal atrial fibrillation: Secondary | ICD-10-CM | POA: Insufficient documentation

## 2015-02-22 DIAGNOSIS — M1990 Unspecified osteoarthritis, unspecified site: Secondary | ICD-10-CM | POA: Insufficient documentation

## 2015-02-22 DIAGNOSIS — K219 Gastro-esophageal reflux disease without esophagitis: Secondary | ICD-10-CM | POA: Insufficient documentation

## 2015-02-22 HISTORY — PX: CATARACT EXTRACTION W/PHACO: SHX586

## 2015-02-22 SURGERY — PHACOEMULSIFICATION, CATARACT, WITH IOL INSERTION
Anesthesia: Monitor Anesthesia Care | Site: Eye | Laterality: Left

## 2015-02-22 MED ORDER — FENTANYL CITRATE (PF) 100 MCG/2ML IJ SOLN
25.0000 ug | INTRAMUSCULAR | Status: AC
  Administered 2015-02-22 (×2): 25 ug via INTRAVENOUS

## 2015-02-22 MED ORDER — NEOMYCIN-POLYMYXIN-DEXAMETH 3.5-10000-0.1 OP SUSP
OPHTHALMIC | Status: AC
  Filled 2015-02-22: qty 5

## 2015-02-22 MED ORDER — PHENYLEPHRINE HCL 2.5 % OP SOLN
1.0000 [drp] | OPHTHALMIC | Status: AC
  Administered 2015-02-22 (×3): 1 [drp] via OPHTHALMIC

## 2015-02-22 MED ORDER — FENTANYL CITRATE (PF) 100 MCG/2ML IJ SOLN
INTRAMUSCULAR | Status: AC
  Filled 2015-02-22: qty 2

## 2015-02-22 MED ORDER — LIDOCAINE HCL (PF) 1 % IJ SOLN
INTRAMUSCULAR | Status: AC
  Filled 2015-02-22: qty 2

## 2015-02-22 MED ORDER — LIDOCAINE HCL 3.5 % OP GEL
OPHTHALMIC | Status: AC
  Filled 2015-02-22: qty 1

## 2015-02-22 MED ORDER — POVIDONE-IODINE 5 % OP SOLN
OPHTHALMIC | Status: DC | PRN
  Administered 2015-02-22: 1 via OPHTHALMIC

## 2015-02-22 MED ORDER — PROVISC 10 MG/ML IO SOLN
INTRAOCULAR | Status: DC | PRN
  Administered 2015-02-22: 0.85 mL via INTRAOCULAR

## 2015-02-22 MED ORDER — LACTATED RINGERS IV SOLN
INTRAVENOUS | Status: DC
  Administered 2015-02-22: 07:00:00 via INTRAVENOUS

## 2015-02-22 MED ORDER — TETRACAINE HCL 0.5 % OP SOLN
1.0000 [drp] | OPHTHALMIC | Status: AC
  Administered 2015-02-22 (×3): 1 [drp] via OPHTHALMIC

## 2015-02-22 MED ORDER — LIDOCAINE HCL (PF) 1 % IJ SOLN
INTRAMUSCULAR | Status: DC | PRN
  Administered 2015-02-22: .7 mL

## 2015-02-22 MED ORDER — MIDAZOLAM HCL 2 MG/2ML IJ SOLN
INTRAMUSCULAR | Status: AC
  Filled 2015-02-22: qty 2

## 2015-02-22 MED ORDER — EPINEPHRINE HCL 1 MG/ML IJ SOLN
INTRAOCULAR | Status: DC | PRN
  Administered 2015-02-22: 500 mL

## 2015-02-22 MED ORDER — EPINEPHRINE HCL 1 MG/ML IJ SOLN
INTRAMUSCULAR | Status: AC
  Filled 2015-02-22: qty 1

## 2015-02-22 MED ORDER — PHENYLEPHRINE HCL 2.5 % OP SOLN
OPHTHALMIC | Status: AC
  Filled 2015-02-22: qty 15

## 2015-02-22 MED ORDER — TETRACAINE HCL 0.5 % OP SOLN
OPHTHALMIC | Status: AC
  Filled 2015-02-22: qty 2

## 2015-02-22 MED ORDER — CYCLOPENTOLATE-PHENYLEPHRINE 0.2-1 % OP SOLN
1.0000 [drp] | OPHTHALMIC | Status: AC
  Administered 2015-02-22 (×3): 1 [drp] via OPHTHALMIC

## 2015-02-22 MED ORDER — MIDAZOLAM HCL 2 MG/2ML IJ SOLN
1.0000 mg | INTRAMUSCULAR | Status: DC | PRN
  Administered 2015-02-22 (×3): 2 mg via INTRAVENOUS
  Filled 2015-02-22 (×2): qty 2

## 2015-02-22 MED ORDER — NEOMYCIN-POLYMYXIN-DEXAMETH 3.5-10000-0.1 OP SUSP
OPHTHALMIC | Status: DC | PRN
  Administered 2015-02-22: 2 [drp] via OPHTHALMIC

## 2015-02-22 MED ORDER — CYCLOPENTOLATE-PHENYLEPHRINE OP SOLN OPTIME - NO CHARGE
OPHTHALMIC | Status: AC
  Filled 2015-02-22: qty 2

## 2015-02-22 MED ORDER — BSS IO SOLN
INTRAOCULAR | Status: DC | PRN
  Administered 2015-02-22: 15 mL

## 2015-02-22 MED ORDER — LIDOCAINE HCL 3.5 % OP GEL
1.0000 "application " | Freq: Once | OPHTHALMIC | Status: AC
  Administered 2015-02-22: 1 via OPHTHALMIC

## 2015-02-22 SURGICAL SUPPLY — 12 items
CLOTH BEACON ORANGE TIMEOUT ST (SAFETY) ×3 IMPLANT
EYE SHIELD UNIVERSAL CLEAR (GAUZE/BANDAGES/DRESSINGS) ×3 IMPLANT
GLOVE BIOGEL PI IND STRL 7.0 (GLOVE) ×1 IMPLANT
GLOVE BIOGEL PI IND STRL 7.5 (GLOVE) ×1 IMPLANT
GLOVE BIOGEL PI INDICATOR 7.0 (GLOVE) ×2
GLOVE BIOGEL PI INDICATOR 7.5 (GLOVE) ×2
PAD ARMBOARD 7.5X6 YLW CONV (MISCELLANEOUS) ×3 IMPLANT
SIGHTPATH CAT PROC W REG LENS (Ophthalmic Related) ×3 IMPLANT
SYRINGE LUER LOK 1CC (MISCELLANEOUS) ×3 IMPLANT
TAPE SURG TRANSPORE 1 IN (GAUZE/BANDAGES/DRESSINGS) ×1 IMPLANT
TAPE SURGICAL TRANSPORE 1 IN (GAUZE/BANDAGES/DRESSINGS) ×2
WATER STERILE IRR 250ML POUR (IV SOLUTION) ×3 IMPLANT

## 2015-02-22 NOTE — Transfer of Care (Signed)
Immediate Anesthesia Transfer of Care Note  Patient: Brooke Luna  Procedure(s) Performed: Procedure(s): CATARACT EXTRACTION PHACO AND INTRAOCULAR LENS PLACEMENT LEFT EYE CDE=5.41 (Left)  Patient Location: Short Stay  Anesthesia Type:MAC  Level of Consciousness: awake  Airway & Oxygen Therapy: Patient Spontanous Breathing  Post-op Assessment: Report given to RN  Post vital signs: Reviewed  Last Vitals:  Filed Vitals:   02/22/15 0805  BP: 122/66  Temp:   Resp: 25    Complications: No apparent anesthesia complications

## 2015-02-22 NOTE — Anesthesia Preprocedure Evaluation (Signed)
Anesthesia Evaluation  Patient identified by MRN, date of birth, ID band  Reviewed: Allergy & Precautions, NPO status , Patient's Chart, lab work & pertinent test results  Airway Mallampati: I  TM Distance: >3 FB     Dental  (+) Edentulous Upper   Pulmonary former smoker,    breath sounds clear to auscultation       Cardiovascular + Peripheral Vascular Disease  + dysrhythmias Atrial Fibrillation  Rhythm:Regular Rate:Normal     Neuro/Psych    GI/Hepatic GERD  ,  Endo/Other    Renal/GU      Musculoskeletal  (+) Arthritis ,   Abdominal   Peds  Hematology  (+) JEHOVAH'S WITNESS  Anesthesia Other Findings   Reproductive/Obstetrics                             Anesthesia Physical Anesthesia Plan  ASA: III  Anesthesia Plan: MAC   Post-op Pain Management:    Induction: Intravenous  Airway Management Planned: Nasal Cannula  Additional Equipment:   Intra-op Plan:   Post-operative Plan:   Informed Consent: I have reviewed the patients History and Physical, chart, labs and discussed the procedure including the risks, benefits and alternatives for the proposed anesthesia with the patient or authorized representative who has indicated his/her understanding and acceptance.     Plan Discussed with:   Anesthesia Plan Comments:         Anesthesia Quick Evaluation

## 2015-02-22 NOTE — H&P (Signed)
I have reviewed the H&P, the patient was re-examined, and I have identified no interval changes in medical condition and plan of care since the history and physical of record  

## 2015-02-22 NOTE — Anesthesia Postprocedure Evaluation (Signed)
  Anesthesia Post-op Note  Patient: Brooke Luna  Procedure(s) Performed: Procedure(s): CATARACT EXTRACTION PHACO AND INTRAOCULAR LENS PLACEMENT LEFT EYE CDE=5.41 (Left)  Patient Location: Short Stay  Anesthesia Type:MAC  Level of Consciousness: awake, alert  and oriented  Airway and Oxygen Therapy: Patient Spontanous Breathing  Post-op Pain: none  Post-op Assessment: Post-op Vital signs reviewed, Patient's Cardiovascular Status Stable, Respiratory Function Stable, Patent Airway and No signs of Nausea or vomiting              Post-op Vital Signs: Reviewed and stable  Last Vitals:  Filed Vitals:   02/22/15 0805  BP: 122/66  Temp:   Resp: 25    Complications: No apparent anesthesia complications

## 2015-02-22 NOTE — Op Note (Signed)
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  Assistants: None  Anesthesia: Topical with MAC  Indications: Painless, progressive loss of vision with compromise of daily activities.  Surgery: Cataract Extraction with Intraocular lens Implant Left Eye  Discription: The patient had dilating drops and viscous lidocaine placed into the Left eye in the pre-op holding area. After transfer to the operating room, a time out was performed. The patient was then prepped and draped. Beginning with a 49 degree blade a paracentesis port was made at the surgeon's 2 o'clock position. The anterior chamber was then filled with 1% non-preserved lidocaine. This was followed by filling the anterior chamber with Provisc.  A 2.27mm keratome blade was used to make a clear corneal incision at the temporal limbus.  A bent cystatome needle was used to create a continuous tear capsulotomy. Hydrodissection was performed with balanced salt solution on a Fine canula. The lens nucleus was then removed using the phacoemulsification handpiece. Residual cortex was removed with the I&A handpiece. The anterior chamber and capsular bag were refilled with Provisc. A posterior chamber intraocular lens was placed into the capsular bag with it's injector. The implant was positioned with the Kuglan hook. The Provisc was then removed from the anterior chamber and capsular bag with the I&A handpiece. Stromal hydration of the main incision and paracentesis port was performed with BSS on a Fine canula. The wounds were tested for leak which was negative. The patient tolerated the procedure well. There were no operative complications. The patient was then transferred to the recovery room in stable condition.  Complications: None  Specimen: None  EBL: None  Prosthetic device: Hoya iSert 250, power 19.5 D,  SN [id number].

## 2015-02-22 NOTE — Discharge Instructions (Signed)

## 2015-02-23 ENCOUNTER — Encounter (HOSPITAL_COMMUNITY): Payer: Self-pay | Admitting: Ophthalmology

## 2015-03-21 ENCOUNTER — Encounter (HOSPITAL_COMMUNITY)
Admission: RE | Admit: 2015-03-21 | Discharge: 2015-03-21 | Disposition: A | Discharge status: Home / Self Care | Payer: Medicare Other | Source: Ambulatory Visit | Attending: Ophthalmology | Admitting: Ophthalmology

## 2015-03-26 ENCOUNTER — Encounter (HOSPITAL_COMMUNITY): Payer: Self-pay | Admitting: *Deleted

## 2015-03-26 ENCOUNTER — Ambulatory Visit (HOSPITAL_COMMUNITY)
Admission: RE | Admit: 2015-03-26 | Discharge: 2015-03-26 | Disposition: A | Discharge status: Home / Self Care | Payer: Medicare Other | Source: Ambulatory Visit | Attending: Ophthalmology | Admitting: Ophthalmology

## 2015-03-26 ENCOUNTER — Ambulatory Visit (HOSPITAL_COMMUNITY): Payer: Medicare Other | Admitting: Anesthesiology

## 2015-03-26 ENCOUNTER — Encounter (HOSPITAL_COMMUNITY)
Admission: RE | Disposition: A | Discharge status: Home / Self Care | Payer: Self-pay | Source: Ambulatory Visit | Attending: Ophthalmology

## 2015-03-26 DIAGNOSIS — K219 Gastro-esophageal reflux disease without esophagitis: Secondary | ICD-10-CM | POA: Insufficient documentation

## 2015-03-26 DIAGNOSIS — I4891 Unspecified atrial fibrillation: Secondary | ICD-10-CM | POA: Insufficient documentation

## 2015-03-26 DIAGNOSIS — M1991 Primary osteoarthritis, unspecified site: Secondary | ICD-10-CM | POA: Diagnosis not present

## 2015-03-26 DIAGNOSIS — Z7901 Long term (current) use of anticoagulants: Secondary | ICD-10-CM | POA: Insufficient documentation

## 2015-03-26 DIAGNOSIS — H25812 Combined forms of age-related cataract, left eye: Secondary | ICD-10-CM | POA: Insufficient documentation

## 2015-03-26 DIAGNOSIS — Z87891 Personal history of nicotine dependence: Secondary | ICD-10-CM | POA: Insufficient documentation

## 2015-03-26 DIAGNOSIS — Z79899 Other long term (current) drug therapy: Secondary | ICD-10-CM | POA: Insufficient documentation

## 2015-03-26 HISTORY — PX: CATARACT EXTRACTION W/PHACO: SHX586

## 2015-03-26 SURGERY — PHACOEMULSIFICATION, CATARACT, WITH IOL INSERTION
Anesthesia: Monitor Anesthesia Care | Site: Eye | Laterality: Right

## 2015-03-26 MED ORDER — FENTANYL CITRATE (PF) 100 MCG/2ML IJ SOLN
INTRAMUSCULAR | Status: AC
  Filled 2015-03-26: qty 2

## 2015-03-26 MED ORDER — PROVISC 10 MG/ML IO SOLN
INTRAOCULAR | Status: DC | PRN
  Administered 2015-03-26: 0.85 mL via INTRAOCULAR

## 2015-03-26 MED ORDER — BSS IO SOLN
INTRAOCULAR | Status: DC | PRN
  Administered 2015-03-26: 13:00:00

## 2015-03-26 MED ORDER — FENTANYL CITRATE (PF) 100 MCG/2ML IJ SOLN: 25.0000 ug | INTRAMUSCULAR | Status: DC | PRN

## 2015-03-26 MED ORDER — LIDOCAINE HCL 3.5 % OP GEL
1.0000 "application " | Freq: Once | OPHTHALMIC | Status: AC
  Administered 2015-03-26: 1 via OPHTHALMIC

## 2015-03-26 MED ORDER — PHENYLEPHRINE HCL 2.5 % OP SOLN
1.0000 [drp] | OPHTHALMIC | Status: AC
  Administered 2015-03-26 (×3): 1 [drp] via OPHTHALMIC

## 2015-03-26 MED ORDER — ONDANSETRON HCL 4 MG/2ML IJ SOLN: 4.0000 mg | Freq: Once | INTRAMUSCULAR | Status: DC | PRN

## 2015-03-26 MED ORDER — MIDAZOLAM HCL 2 MG/2ML IJ SOLN
INTRAMUSCULAR | Status: AC
  Filled 2015-03-26: qty 2

## 2015-03-26 MED ORDER — NEOMYCIN-POLYMYXIN-DEXAMETH 3.5-10000-0.1 OP SUSP
OPHTHALMIC | Status: DC | PRN
  Administered 2015-03-26: 2 [drp] via OPHTHALMIC

## 2015-03-26 MED ORDER — MIDAZOLAM HCL 2 MG/2ML IJ SOLN
1.0000 mg | INTRAMUSCULAR | Status: DC | PRN
  Administered 2015-03-26: 2 mg via INTRAVENOUS

## 2015-03-26 MED ORDER — FENTANYL CITRATE (PF) 100 MCG/2ML IJ SOLN
INTRAMUSCULAR | Status: DC | PRN
  Administered 2015-03-26 (×2): 25 ug via INTRAVENOUS

## 2015-03-26 MED ORDER — LACTATED RINGERS IV SOLN
INTRAVENOUS | Status: DC
  Administered 2015-03-26: 13:00:00 via INTRAVENOUS

## 2015-03-26 MED ORDER — POVIDONE-IODINE 5 % OP SOLN
OPHTHALMIC | Status: DC | PRN
  Administered 2015-03-26: 1 via OPHTHALMIC

## 2015-03-26 MED ORDER — CYCLOPENTOLATE-PHENYLEPHRINE 0.2-1 % OP SOLN
1.0000 [drp] | OPHTHALMIC | Status: AC
  Administered 2015-03-26 (×3): 1 [drp] via OPHTHALMIC

## 2015-03-26 MED ORDER — ONDANSETRON HCL 4 MG/2ML IJ SOLN: 4.0000 mg | Freq: Once | INTRAMUSCULAR | Status: DC

## 2015-03-26 MED ORDER — EPINEPHRINE HCL 1 MG/ML IJ SOLN
INTRAMUSCULAR | Status: AC
  Filled 2015-03-26: qty 1

## 2015-03-26 MED ORDER — LIDOCAINE 3.5 % OP GEL OPTIME - NO CHARGE
OPHTHALMIC | Status: DC | PRN
  Administered 2015-03-26: 2 [drp] via OPHTHALMIC

## 2015-03-26 MED ORDER — LIDOCAINE HCL (PF) 1 % IJ SOLN
INTRAMUSCULAR | Status: DC | PRN
  Administered 2015-03-26: .7 mL

## 2015-03-26 MED ORDER — BSS IO SOLN
INTRAOCULAR | Status: DC | PRN
  Administered 2015-03-26: 15 mL via INTRAOCULAR

## 2015-03-26 MED ORDER — TETRACAINE HCL 0.5 % OP SOLN
1.0000 [drp] | OPHTHALMIC | Status: AC
  Administered 2015-03-26 (×3): 1 [drp] via OPHTHALMIC

## 2015-03-26 SURGICAL SUPPLY — 34 items
CAPSULAR TENSION RING-AMO (OPHTHALMIC RELATED) IMPLANT
CLOTH BEACON ORANGE TIMEOUT ST (SAFETY) ×3 IMPLANT
EYE SHIELD UNIVERSAL CLEAR (GAUZE/BANDAGES/DRESSINGS) ×3 IMPLANT
GLOVE BIO SURGEON STRL SZ 6.5 (GLOVE) IMPLANT
GLOVE BIO SURGEONS STRL SZ 6.5 (GLOVE)
GLOVE BIOGEL PI IND STRL 6.5 (GLOVE) ×1 IMPLANT
GLOVE BIOGEL PI IND STRL 7.0 (GLOVE) IMPLANT
GLOVE BIOGEL PI IND STRL 7.5 (GLOVE) IMPLANT
GLOVE BIOGEL PI INDICATOR 6.5 (GLOVE) ×2
GLOVE BIOGEL PI INDICATOR 7.0 (GLOVE)
GLOVE BIOGEL PI INDICATOR 7.5 (GLOVE)
GLOVE ECLIPSE 6.5 STRL STRAW (GLOVE) IMPLANT
GLOVE ECLIPSE 7.0 STRL STRAW (GLOVE) IMPLANT
GLOVE ECLIPSE 7.5 STRL STRAW (GLOVE) IMPLANT
GLOVE EXAM NITRILE LRG STRL (GLOVE) IMPLANT
GLOVE EXAM NITRILE MD LF STRL (GLOVE) IMPLANT
GLOVE SKINSENSE NS SZ6.5 (GLOVE)
GLOVE SKINSENSE NS SZ7.0 (GLOVE)
GLOVE SKINSENSE STRL SZ6.5 (GLOVE) IMPLANT
GLOVE SKINSENSE STRL SZ7.0 (GLOVE) IMPLANT
KIT VITRECTOMY (OPHTHALMIC RELATED) IMPLANT
PAD ARMBOARD 7.5X6 YLW CONV (MISCELLANEOUS) ×3 IMPLANT
PROC W NO LENS (INTRAOCULAR LENS)
PROC W SPEC LENS (INTRAOCULAR LENS)
PROCESS W NO LENS (INTRAOCULAR LENS) IMPLANT
PROCESS W SPEC LENS (INTRAOCULAR LENS) IMPLANT
RETRACTOR IRIS SIGHTPATH (OPHTHALMIC RELATED) IMPLANT
RING MALYGIN (MISCELLANEOUS) IMPLANT
SIGHTPATH CAT PROC W REG LENS (Ophthalmic Related) ×3 IMPLANT
SYRINGE LUER LOK 1CC (MISCELLANEOUS) ×3 IMPLANT
TAPE SURG TRANSPARENT 2IN (GAUZE/BANDAGES/DRESSINGS) ×1 IMPLANT
TAPE TRANSPARENT 2IN (GAUZE/BANDAGES/DRESSINGS) ×2
VISCOELASTIC ADDITIONAL (OPHTHALMIC RELATED) IMPLANT
WATER STERILE IRR 250ML POUR (IV SOLUTION) ×3 IMPLANT

## 2015-03-26 NOTE — Anesthesia Postprocedure Evaluation (Signed)
  Anesthesia Post-op Note  Patient: Brooke Luna  Procedure(s) Performed: Procedure(s) (LRB): CATARACT EXTRACTION PHACO AND INTRAOCULAR LENS PLACEMENT (IOC) (Right)  Patient Location:  Short Stay  Anesthesia Type: MAC  Level of Consciousness: awake  Airway and Oxygen Therapy: Patient Spontanous Breathing  Post-op Pain: none  Post-op Assessment: Post-op Vital signs reviewed, Patient's Cardiovascular Status Stable, Respiratory Function Stable, Patent Airway, No signs of Nausea or vomiting and Pain level controlled  Post-op Vital Signs: Reviewed and stable  Complications: No apparent anesthesia complications

## 2015-03-26 NOTE — Anesthesia Preprocedure Evaluation (Signed)
Anesthesia Evaluation  Patient identified by MRN, date of birth, ID band  Reviewed: Allergy & Precautions, NPO status , Patient's Chart, lab work & pertinent test results  Airway Mallampati: I  TM Distance: >3 FB     Dental  (+) Edentulous Upper   Pulmonary former smoker,    breath sounds clear to auscultation       Cardiovascular + Peripheral Vascular Disease  + dysrhythmias Atrial Fibrillation  Rhythm:Regular Rate:Normal     Neuro/Psych    GI/Hepatic GERD  ,  Endo/Other    Renal/GU      Musculoskeletal  (+) Arthritis ,   Abdominal   Peds  Hematology  (+) JEHOVAH'S WITNESS  Anesthesia Other Findings   Reproductive/Obstetrics                             Anesthesia Physical Anesthesia Plan  ASA: III  Anesthesia Plan: MAC   Post-op Pain Management:    Induction: Intravenous  Airway Management Planned: Nasal Cannula  Additional Equipment:   Intra-op Plan:   Post-operative Plan:   Informed Consent: I have reviewed the patients History and Physical, chart, labs and discussed the procedure including the risks, benefits and alternatives for the proposed anesthesia with the patient or authorized representative who has indicated his/her understanding and acceptance.     Plan Discussed with:   Anesthesia Plan Comments:         Anesthesia Quick Evaluation  

## 2015-03-26 NOTE — Discharge Instructions (Signed)

## 2015-03-26 NOTE — H&P (Signed)
I have reviewed the H&P, the patient was re-examined, and I have identified no interval changes in medical condition and plan of care since the history and physical of record  

## 2015-03-26 NOTE — Op Note (Signed)
Date of Admission: 03/26/2015  Date of Surgery: 03/26/2015   Pre-Op Dx: Cataract Right Eye  Post-Op Dx: Senile Combined Cataract Right  Eye,  Dx Code [id number]  Surgeon: Tonny Branch, M.D.  Assistants: None  Anesthesia: Topical with MAC  Indications: Painless, progressive loss of vision with compromise of daily activities.  Surgery: Cataract Extraction with Intraocular lens Implant Right Eye  Discription: The patient had dilating drops and viscous lidocaine placed into the Right eye in the pre-op holding area. After transfer to the operating room, a time out was performed. The patient was then prepped and draped. Beginning with a 47 degree blade a paracentesis port was made at the surgeon's 2 o'clock position. The anterior chamber was then filled with 1% non-preserved lidocaine. This was followed by filling the anterior chamber with Provisc.  A 2.62mm keratome blade was used to make a clear corneal incision at the temporal limbus.  A bent cystatome needle was used to create a continuous tear capsulotomy. Hydrodissection was performed with balanced salt solution on a Fine canula. The lens nucleus was then removed using the phacoemulsification handpiece. Residual cortex was removed with the I&A handpiece. The anterior chamber and capsular bag were refilled with Provisc. A posterior chamber intraocular lens was placed into the capsular bag with it's injector. The implant was positioned with the Kuglan hook. The Provisc was then removed from the anterior chamber and capsular bag with the I&A handpiece. Stromal hydration of the main incision and paracentesis port was performed with BSS on a Fine canula. The wounds were tested for leak which was negative. The patient tolerated the procedure well. There were no operative complications. The patient was then transferred to the recovery room in stable condition.  Complications: None  Specimen: None  EBL: None  Prosthetic device: Hoya iSert 250, power 19.0  D, SN [id number].

## 2015-03-26 NOTE — Transfer of Care (Signed)
Immediate Anesthesia Transfer of Care Note  Patient: Brooke Luna  Procedure(s) Performed: Procedure(s) (LRB): CATARACT EXTRACTION PHACO AND INTRAOCULAR LENS PLACEMENT (IOC) (Right)  Patient Location: Shortstay  Anesthesia Type: MAC  Level of Consciousness: awake  Airway & Oxygen Therapy: Patient Spontanous Breathing   Post-op Assessment: Report given to PACU RN, Post -op Vital signs reviewed and stable and Patient moving all extremities  Post vital signs: Reviewed and stable  Complications: No apparent anesthesia complications

## 2015-03-26 NOTE — Anesthesia Procedure Notes (Signed)
Procedure Name: MAC Date/Time: 03/26/2015 12:44 PM Performed by: Vista Deck Pre-anesthesia Checklist: Patient identified, Emergency Drugs available, Suction available, Timeout performed and Patient being monitored Patient Re-evaluated:Patient Re-evaluated prior to inductionOxygen Delivery Method: Nasal Cannula

## 2015-03-27 ENCOUNTER — Encounter (HOSPITAL_COMMUNITY): Payer: Self-pay | Admitting: Ophthalmology

## 2015-05-29 DIAGNOSIS — M545 Low back pain: Secondary | ICD-10-CM | POA: Diagnosis not present

## 2015-05-29 DIAGNOSIS — I4891 Unspecified atrial fibrillation: Secondary | ICD-10-CM | POA: Diagnosis not present

## 2015-05-29 DIAGNOSIS — I10 Essential (primary) hypertension: Secondary | ICD-10-CM | POA: Diagnosis not present

## 2015-05-29 DIAGNOSIS — Z6841 Body Mass Index (BMI) 40.0 and over, adult: Secondary | ICD-10-CM | POA: Diagnosis not present

## 2015-05-29 DIAGNOSIS — Z87891 Personal history of nicotine dependence: Secondary | ICD-10-CM | POA: Diagnosis not present

## 2015-06-19 ENCOUNTER — Other Ambulatory Visit: Payer: Self-pay | Admitting: Neurosurgery

## 2015-06-19 DIAGNOSIS — Z6841 Body Mass Index (BMI) 40.0 and over, adult: Secondary | ICD-10-CM | POA: Diagnosis not present

## 2015-06-19 DIAGNOSIS — M4726 Other spondylosis with radiculopathy, lumbar region: Secondary | ICD-10-CM

## 2015-06-19 DIAGNOSIS — M5136 Other intervertebral disc degeneration, lumbar region: Secondary | ICD-10-CM | POA: Diagnosis not present

## 2015-06-19 DIAGNOSIS — M549 Dorsalgia, unspecified: Secondary | ICD-10-CM | POA: Diagnosis not present

## 2015-06-19 DIAGNOSIS — M4154 Other secondary scoliosis, thoracic region: Secondary | ICD-10-CM | POA: Diagnosis not present

## 2015-06-19 DIAGNOSIS — M5416 Radiculopathy, lumbar region: Secondary | ICD-10-CM | POA: Diagnosis not present

## 2015-06-29 ENCOUNTER — Other Ambulatory Visit: Payer: Commercial Managed Care - HMO

## 2015-07-02 ENCOUNTER — Inpatient Hospital Stay: Admission: RE | Admit: 2015-07-02 | Payer: Commercial Managed Care - HMO | Source: Ambulatory Visit

## 2015-07-08 ENCOUNTER — Ambulatory Visit
Admission: RE | Admit: 2015-07-08 | Discharge: 2015-07-08 | Disposition: A | Discharge status: Home / Self Care | Payer: Medicare Other | Source: Ambulatory Visit | Attending: Neurosurgery | Admitting: Neurosurgery

## 2015-07-08 DIAGNOSIS — M5126 Other intervertebral disc displacement, lumbar region: Secondary | ICD-10-CM | POA: Diagnosis not present

## 2015-07-08 DIAGNOSIS — M4726 Other spondylosis with radiculopathy, lumbar region: Secondary | ICD-10-CM

## 2015-07-10 DIAGNOSIS — G8929 Other chronic pain: Secondary | ICD-10-CM | POA: Diagnosis not present

## 2015-07-10 DIAGNOSIS — M4726 Other spondylosis with radiculopathy, lumbar region: Secondary | ICD-10-CM | POA: Diagnosis not present

## 2015-07-10 DIAGNOSIS — M5136 Other intervertebral disc degeneration, lumbar region: Secondary | ICD-10-CM | POA: Diagnosis not present

## 2015-07-10 DIAGNOSIS — M533 Sacrococcygeal disorders, not elsewhere classified: Secondary | ICD-10-CM | POA: Diagnosis not present

## 2015-07-10 DIAGNOSIS — Z6841 Body Mass Index (BMI) 40.0 and over, adult: Secondary | ICD-10-CM | POA: Diagnosis not present

## 2015-07-10 DIAGNOSIS — S34123A Incomplete lesion of L3 level of lumbar spinal cord, initial encounter: Secondary | ICD-10-CM | POA: Diagnosis not present

## 2015-07-10 DIAGNOSIS — M5442 Lumbago with sciatica, left side: Secondary | ICD-10-CM | POA: Diagnosis not present

## 2015-07-12 ENCOUNTER — Other Ambulatory Visit: Payer: Self-pay | Admitting: Neurosurgery

## 2015-07-12 DIAGNOSIS — M519 Unspecified thoracic, thoracolumbar and lumbosacral intervertebral disc disorder: Secondary | ICD-10-CM

## 2015-07-23 ENCOUNTER — Ambulatory Visit
Admission: RE | Admit: 2015-07-23 | Discharge: 2015-07-23 | Disposition: A | Discharge status: Home / Self Care | Payer: Medicare Other | Source: Ambulatory Visit | Attending: Neurosurgery | Admitting: Neurosurgery

## 2015-07-23 DIAGNOSIS — M519 Unspecified thoracic, thoracolumbar and lumbosacral intervertebral disc disorder: Secondary | ICD-10-CM

## 2015-07-23 DIAGNOSIS — M899 Disorder of bone, unspecified: Secondary | ICD-10-CM | POA: Diagnosis not present

## 2015-07-23 MED ORDER — GADOBENATE DIMEGLUMINE 529 MG/ML IV SOLN
20.0000 mL | Freq: Once | INTRAVENOUS | Status: AC | PRN
  Administered 2015-07-23: 20 mL via INTRAVENOUS

## 2015-07-31 DIAGNOSIS — Z6841 Body Mass Index (BMI) 40.0 and over, adult: Secondary | ICD-10-CM | POA: Diagnosis not present

## 2015-07-31 DIAGNOSIS — M533 Sacrococcygeal disorders, not elsewhere classified: Secondary | ICD-10-CM | POA: Diagnosis not present

## 2015-07-31 DIAGNOSIS — R030 Elevated blood-pressure reading, without diagnosis of hypertension: Secondary | ICD-10-CM | POA: Diagnosis not present

## 2015-08-02 DIAGNOSIS — Z87891 Personal history of nicotine dependence: Secondary | ICD-10-CM | POA: Diagnosis not present

## 2015-08-02 DIAGNOSIS — J069 Acute upper respiratory infection, unspecified: Secondary | ICD-10-CM | POA: Diagnosis not present

## 2015-08-02 DIAGNOSIS — Z299 Encounter for prophylactic measures, unspecified: Secondary | ICD-10-CM | POA: Diagnosis not present

## 2015-08-02 DIAGNOSIS — F329 Major depressive disorder, single episode, unspecified: Secondary | ICD-10-CM | POA: Diagnosis not present

## 2015-09-12 DIAGNOSIS — I10 Essential (primary) hypertension: Secondary | ICD-10-CM | POA: Diagnosis not present

## 2015-09-12 DIAGNOSIS — I2510 Atherosclerotic heart disease of native coronary artery without angina pectoris: Secondary | ICD-10-CM | POA: Diagnosis not present

## 2015-09-12 DIAGNOSIS — E7800 Pure hypercholesterolemia, unspecified: Secondary | ICD-10-CM | POA: Diagnosis not present

## 2015-09-12 DIAGNOSIS — I4891 Unspecified atrial fibrillation: Secondary | ICD-10-CM | POA: Diagnosis not present

## 2015-10-19 DIAGNOSIS — I10 Essential (primary) hypertension: Secondary | ICD-10-CM | POA: Diagnosis not present

## 2015-10-19 DIAGNOSIS — I2510 Atherosclerotic heart disease of native coronary artery without angina pectoris: Secondary | ICD-10-CM | POA: Diagnosis not present

## 2015-10-19 DIAGNOSIS — I4891 Unspecified atrial fibrillation: Secondary | ICD-10-CM | POA: Diagnosis not present

## 2015-10-19 DIAGNOSIS — E7800 Pure hypercholesterolemia, unspecified: Secondary | ICD-10-CM | POA: Diagnosis not present

## 2015-11-08 DIAGNOSIS — E7800 Pure hypercholesterolemia, unspecified: Secondary | ICD-10-CM | POA: Diagnosis not present

## 2015-11-08 DIAGNOSIS — I700 Atherosclerosis of aorta: Secondary | ICD-10-CM | POA: Diagnosis not present

## 2015-11-08 DIAGNOSIS — F329 Major depressive disorder, single episode, unspecified: Secondary | ICD-10-CM | POA: Diagnosis not present

## 2015-11-08 DIAGNOSIS — J449 Chronic obstructive pulmonary disease, unspecified: Secondary | ICD-10-CM | POA: Diagnosis not present

## 2015-11-08 DIAGNOSIS — Z299 Encounter for prophylactic measures, unspecified: Secondary | ICD-10-CM | POA: Diagnosis not present

## 2015-11-08 DIAGNOSIS — Z87891 Personal history of nicotine dependence: Secondary | ICD-10-CM | POA: Diagnosis not present

## 2015-11-08 DIAGNOSIS — I10 Essential (primary) hypertension: Secondary | ICD-10-CM | POA: Diagnosis not present

## 2015-11-08 DIAGNOSIS — R05 Cough: Secondary | ICD-10-CM | POA: Diagnosis not present

## 2015-12-18 DIAGNOSIS — Z299 Encounter for prophylactic measures, unspecified: Secondary | ICD-10-CM | POA: Diagnosis not present

## 2015-12-18 DIAGNOSIS — E7800 Pure hypercholesterolemia, unspecified: Secondary | ICD-10-CM | POA: Diagnosis not present

## 2015-12-18 DIAGNOSIS — Z0000 Encounter for general adult medical examination without abnormal findings: Secondary | ICD-10-CM | POA: Diagnosis not present

## 2015-12-18 DIAGNOSIS — E039 Hypothyroidism, unspecified: Secondary | ICD-10-CM | POA: Diagnosis not present

## 2015-12-18 DIAGNOSIS — E559 Vitamin D deficiency, unspecified: Secondary | ICD-10-CM | POA: Diagnosis not present

## 2015-12-18 DIAGNOSIS — Z1211 Encounter for screening for malignant neoplasm of colon: Secondary | ICD-10-CM | POA: Diagnosis not present

## 2015-12-18 DIAGNOSIS — Z79899 Other long term (current) drug therapy: Secondary | ICD-10-CM | POA: Diagnosis not present

## 2015-12-18 DIAGNOSIS — Z1389 Encounter for screening for other disorder: Secondary | ICD-10-CM | POA: Diagnosis not present

## 2015-12-18 DIAGNOSIS — Z7189 Other specified counseling: Secondary | ICD-10-CM | POA: Diagnosis not present

## 2015-12-18 DIAGNOSIS — R5383 Other fatigue: Secondary | ICD-10-CM | POA: Diagnosis not present

## 2016-01-18 ENCOUNTER — Other Ambulatory Visit: Payer: Self-pay | Admitting: Neurosurgery

## 2016-01-18 DIAGNOSIS — M533 Sacrococcygeal disorders, not elsewhere classified: Secondary | ICD-10-CM

## 2016-02-25 ENCOUNTER — Ambulatory Visit
Admission: RE | Admit: 2016-02-25 | Discharge: 2016-02-25 | Disposition: A | Discharge status: Home / Self Care | Payer: Medicare Other | Source: Ambulatory Visit | Attending: Neurosurgery | Admitting: Neurosurgery

## 2016-02-25 DIAGNOSIS — M533 Sacrococcygeal disorders, not elsewhere classified: Secondary | ICD-10-CM

## 2016-02-25 MED ORDER — GADOBENATE DIMEGLUMINE 529 MG/ML IV SOLN
20.0000 mL | Freq: Once | INTRAVENOUS | Status: AC | PRN
  Administered 2016-02-25: 20 mL via INTRAVENOUS

## 2016-03-04 DIAGNOSIS — Z6841 Body Mass Index (BMI) 40.0 and over, adult: Secondary | ICD-10-CM | POA: Diagnosis not present

## 2016-03-04 DIAGNOSIS — G8929 Other chronic pain: Secondary | ICD-10-CM | POA: Diagnosis not present

## 2016-03-04 DIAGNOSIS — M533 Sacrococcygeal disorders, not elsewhere classified: Secondary | ICD-10-CM | POA: Diagnosis not present

## 2016-05-18 ENCOUNTER — Encounter (HOSPITAL_COMMUNITY): Payer: Self-pay | Admitting: *Deleted

## 2016-05-18 ENCOUNTER — Emergency Department (HOSPITAL_COMMUNITY): Payer: Medicare Other

## 2016-05-18 DIAGNOSIS — M791 Myalgia: Secondary | ICD-10-CM | POA: Diagnosis not present

## 2016-05-18 DIAGNOSIS — Z79899 Other long term (current) drug therapy: Secondary | ICD-10-CM | POA: Diagnosis not present

## 2016-05-18 DIAGNOSIS — Z87891 Personal history of nicotine dependence: Secondary | ICD-10-CM | POA: Insufficient documentation

## 2016-05-18 DIAGNOSIS — R079 Chest pain, unspecified: Secondary | ICD-10-CM | POA: Diagnosis not present

## 2016-05-18 DIAGNOSIS — J209 Acute bronchitis, unspecified: Secondary | ICD-10-CM | POA: Diagnosis not present

## 2016-05-18 DIAGNOSIS — M2550 Pain in unspecified joint: Secondary | ICD-10-CM | POA: Insufficient documentation

## 2016-05-18 DIAGNOSIS — R05 Cough: Secondary | ICD-10-CM | POA: Diagnosis not present

## 2016-05-18 NOTE — ED Triage Notes (Signed)
Pt c/o productive cough with some green and blood mixed in sputum x 2 days

## 2016-05-19 ENCOUNTER — Emergency Department (HOSPITAL_COMMUNITY)
Admission: EM | Admit: 2016-05-19 | Discharge: 2016-05-19 | Disposition: A | Discharge status: Home / Self Care | Payer: Medicare Other | Attending: Emergency Medicine | Admitting: Emergency Medicine

## 2016-05-19 DIAGNOSIS — J209 Acute bronchitis, unspecified: Secondary | ICD-10-CM | POA: Diagnosis not present

## 2016-05-19 LAB — BASIC METABOLIC PANEL
ANION GAP: 9 (ref 5–15)
BUN: 8 mg/dL (ref 6–20)
CALCIUM: 8.6 mg/dL — AB (ref 8.9–10.3)
CO2: 27 mmol/L (ref 22–32)
CREATININE: 0.77 mg/dL (ref 0.44–1.00)
Chloride: 100 mmol/L — ABNORMAL LOW (ref 101–111)
Glucose, Bld: 109 mg/dL — ABNORMAL HIGH (ref 65–99)
Potassium: 3.6 mmol/L (ref 3.5–5.1)
Sodium: 136 mmol/L (ref 135–145)

## 2016-05-19 LAB — CBC WITH DIFFERENTIAL/PLATELET
BASOS ABS: 0 10*3/uL (ref 0.0–0.1)
BASOS PCT: 0 %
EOS ABS: 0.6 10*3/uL (ref 0.0–0.7)
EOS PCT: 5 %
HEMATOCRIT: 38.5 % (ref 36.0–46.0)
Hemoglobin: 12.6 g/dL (ref 12.0–15.0)
Lymphocytes Relative: 15 %
Lymphs Abs: 1.8 10*3/uL (ref 0.7–4.0)
MCH: 27.6 pg (ref 26.0–34.0)
MCHC: 32.7 g/dL (ref 30.0–36.0)
MCV: 84.4 fL (ref 78.0–100.0)
MONO ABS: 0.8 10*3/uL (ref 0.1–1.0)
MONOS PCT: 7 %
NEUTROS ABS: 8.7 10*3/uL — AB (ref 1.7–7.7)
Neutrophils Relative %: 73 %
PLATELETS: 323 10*3/uL (ref 150–400)
RBC: 4.56 MIL/uL (ref 3.87–5.11)
RDW: 13.8 % (ref 11.5–15.5)
WBC: 11.9 10*3/uL — ABNORMAL HIGH (ref 4.0–10.5)

## 2016-05-19 LAB — TROPONIN I: Troponin I: <0.03 ng/mL (ref <0.03)

## 2016-05-19 MED ORDER — GI COCKTAIL ~~LOC~~
30.0000 mL | Freq: Once | ORAL | Status: AC
  Administered 2016-05-19: 30 mL via ORAL
  Filled 2016-05-19: qty 30

## 2016-05-19 MED ORDER — ALBUTEROL SULFATE HFA 108 (90 BASE) MCG/ACT IN AERS: 2.0000 | INHALATION_SPRAY | Freq: Four times a day (QID) | RESPIRATORY_TRACT | 0 refills | Status: DC | PRN

## 2016-05-19 MED ORDER — BENZONATATE 100 MG PO CAPS: 100.0000 mg | ORAL_CAPSULE | Freq: Three times a day (TID) | ORAL | 0 refills | Status: DC

## 2016-05-19 MED ORDER — DOXYCYCLINE HYCLATE 100 MG PO CAPS: 100.0000 mg | ORAL_CAPSULE | Freq: Two times a day (BID) | ORAL | 0 refills | Status: DC

## 2016-05-19 NOTE — ED Provider Notes (Signed)
[facility] DEPT Provider Note   CSN: [id number] Arrival date & time: 05/18/16  2303     History   Chief Complaint Chief Complaint  Patient presents with  . Cough    HPI Brooke Luna is a 71 y.o. female.  Patient with history of acid reflux, atrial fibrillation on xarelto presenting with 2 day history of cough productive of clear and green mucus. Today she noticed a streak of red in her mucus mixed with green. This concerned her because she is on xarelto. She's had chills but no fever. She has hoarse voice, congestion and runny nose and sore throat. She's been around multiple people who have had the flu. She did not receive a flu shot. Reports constant burning her chest for the past 2 days. She had not had much of an appetite but is still drinking fluids but not eating very well. Denies diarrhea denies vomiting. Denies any recent prolonged car trips or plane trips.   The history is provided by the patient.  Cough  Associated symptoms include chills, rhinorrhea and myalgias. Pertinent negatives include no chest pain and no headaches.    Past Medical History:  Diagnosis Date  . Arthritis    pain and oa  right knee and pain in rt leg and foot--also pain left knee-but right is worse  . Atrial fibrillation ([place])   . Back pain    lumbar bulging disk  . Contact dermatitis    underside of left leg--always in the same spots--comes and goes  . Diverticulosis   . GERD (gastroesophageal reflux disease)    diet control  . Hemorrhoids     Patient Active Problem List   Diagnosis Date Noted  . Varicose veins of lower extremities with other complications [id number]  . Chronic venous insufficiency 12/17/2012  . Postop Hyponatremia 11/05/2011  . OA (osteoarthritis) of knee 11/03/2011    Past Surgical History:  Procedure Laterality Date  . CATARACT EXTRACTION W/PHACO Left 02/22/2015   Procedure: CATARACT EXTRACTION PHACO AND INTRAOCULAR LENS PLACEMENT LEFT EYE CDE=5.41;   Surgeon: Tonny Branch, MD;  Location: [facility];  Service: Ophthalmology;  Laterality: Left;  . CATARACT EXTRACTION W/PHACO Right 03/26/2015   Procedure: CATARACT EXTRACTION PHACO AND INTRAOCULAR LENS PLACEMENT (IOC);  Surgeon: Tonny Branch, MD;  Location: [facility];  Service: Ophthalmology;  Laterality: Right;  CDE:5.01  . CHOLECYSTECTOMY    . surgery for broken right elbow Right   . TOTAL KNEE ARTHROPLASTY  11/03/2011   Procedure: TOTAL KNEE ARTHROPLASTY;  Surgeon: Gearlean Alf, MD;  Location: [facility];  Service: Orthopedics;  Laterality: Right;  . TUBAL LIGATION      OB History    No data available       Home Medications    Prior to Admission medications   Medication Sig Start Date End Date Taking? Authorizing Provider  citalopram (CELEXA) 20 MG tablet Take 1 tablet by mouth daily. 10/28/12  Yes Historical Provider, MD  omeprazole (PRILOSEC) 20 MG capsule Take 20 mg by mouth daily.   Yes Historical Provider, MD  rivaroxaban (XARELTO) 20 MG TABS tablet Take 20 mg by mouth daily with supper.   Yes Historical Provider, MD  diltiazem (CARDIZEM CD) 180 MG 24 hr capsule Take 180 mg by mouth daily.    Historical Provider, MD    Family History Family History  Problem Relation Age of Onset  . Diabetes Mother   . Stroke Mother   . Stroke Father     Social History Social History  Substance Use Topics  . Smoking status: Former Smoker    Packs/day: 1.00    Years: 38.00    Quit date: 04/28/1996  . Smokeless tobacco: Never Used     Comment: quit smoking 1998  . Alcohol use 2.4 oz/week    4 Cans of beer per week     Comment: couple of beers per week     Allergies   Codeine and Mobic [meloxicam]   Review of Systems Review of Systems  Constitutional: Positive for activity change, appetite change and chills.  HENT: Positive for congestion and rhinorrhea.   Respiratory: Positive for cough and chest tightness.   Cardiovascular: Negative for chest pain.  Gastrointestinal: Negative for  abdominal pain, nausea and vomiting.  Genitourinary: Negative for dysuria, hematuria, vaginal bleeding and vaginal discharge.  Musculoskeletal: Positive for arthralgias and myalgias.  Neurological: Negative for weakness, light-headedness and headaches.   A complete 10 system review of systems was obtained and all systems are negative except as noted in the HPI and PMH.    Physical Exam Updated Vital Signs BP 129/62 (BP Location: Left Arm)   Pulse 85   Temp 99 F (37.2 C) (Oral)   Resp 20   Ht 5\' 5"  (1.651 m)   Wt 250 lb (113.4 kg)   SpO2 96%   BMI 41.60 kg/m   Physical Exam  Constitutional: She is oriented to person, place, and time. She appears well-developed and well-nourished. No distress.  Nontoxic appearing  HENT:  Head: Normocephalic and atraumatic.  Mouth/Throat: Oropharynx is clear and moist. No oropharyngeal exudate.  Eyes: Conjunctivae and EOM are normal. Pupils are equal, round, and reactive to light.  Neck: Normal range of motion. Neck supple.  No meningismus.  Cardiovascular: Normal rate, regular rhythm, normal heart sounds and intact distal pulses.   No murmur heard. Pulmonary/Chest: Effort normal and breath sounds normal. No respiratory distress.  Few scattered rhonchi  Abdominal: Soft. There is no tenderness. There is no rebound and no guarding.  obese  Musculoskeletal: Normal range of motion. She exhibits no edema or tenderness.  Neurological: She is alert and oriented to person, place, and time. No cranial nerve deficit. She exhibits normal muscle tone. Coordination normal.  No ataxia on finger to nose bilaterally. No pronator drift. 5/5 strength throughout. CN 2-12 intact.Equal grip strength. Sensation intact.   Skin: Skin is warm. Capillary refill takes less than 2 seconds.  Psychiatric: She has a normal mood and affect. Her behavior is normal.  Nursing note and vitals reviewed.    ED Treatments / Results  Labs (all labs ordered are listed, but only  abnormal results are displayed) Labs Reviewed  CBC WITH DIFFERENTIAL/PLATELET - Abnormal; Notable for the following:       Result Value   WBC 11.9 (*)    Neutro Abs 8.7 (*)    All other components within normal limits  BASIC METABOLIC PANEL - Abnormal; Notable for the following:    Chloride 100 (*)    Glucose, Bld 109 (*)    Calcium 8.6 (*)    All other components within normal limits  TROPONIN I    EKG  EKG Interpretation  Date/Time:  Monday May 19 2016 03:50:10 EST Ventricular Rate:  79 PR Interval:    QRS Duration: 100 QT Interval:  399 QTC Calculation: 458 R Axis:   64 Text Interpretation:  Sinus rhythm Baseline wander in lead(s) V2 No significant change was found Confirmed by Wyvonnia Dusky  MD, Radonna Bracher [phone]) on 05/19/2016 5:16:28  AM       Radiology Dg Chest 2 View  Result Date: 05/19/2016 CLINICAL DATA:  productive cough with some green and blood mixed in sputum x 2 days, SOB, general chest pain/burning. History of GERD, A-FIB. EXAM: CHEST  2 VIEW COMPARISON:  11/08/2015 FINDINGS: Normal cardiac silhouette. Mild coarsening of the bronchial vascular markings inferiorly. Mild scarring at the RIGHT lung apex unchanged. No focal consolidation. IMPRESSION: Findings concerning for lower lobe bronchitis. Electronically Signed   By: Suzy Bouchard M.D.   On: 05/19/2016 00:05    Procedures Procedures (including critical care time)  Medications Ordered in ED Medications  gi cocktail (Maalox,Lidocaine,Donnatal) (not administered)     Initial Impression / Assessment and Plan / ED Course  I have reviewed the triage vital signs and the nursing notes.  Pertinent labs & imaging results that were available during my care of the patient were reviewed by me and considered in my medical decision making (see chart for details).     2 days of cough, congestion, some blood in her sputum. She is on xarelto. Constant burning in the chest for the past 2 days. Lungs are clear.  X-ray  shows evidence of bronchitis without focal consolidation.  Patient was treated for bronchitis with antibiotics. She is not wheezing will hold on steroids at this time. GI cocktail has resolved chest burning. EKG is unchanged. Troponin is negative after 2 days of constant burning. Doubt ACS. Doubt pulmonary embolism.  Final Clinical Impressions(s) / ED Diagnoses   Final diagnoses:  Acute bronchitis, unspecified organism    New Prescriptions New Prescriptions   No medications on file     Ezequiel Essex, MD 05/19/16 [phone]

## 2016-05-19 NOTE — Discharge Instructions (Signed)
Take the medications as prescribed. Follow up with your doctor. Return to the ED if you develop new or worsening symptoms. °

## 2016-06-18 DIAGNOSIS — I480 Paroxysmal atrial fibrillation: Secondary | ICD-10-CM | POA: Diagnosis not present

## 2016-06-18 DIAGNOSIS — Z713 Dietary counseling and surveillance: Secondary | ICD-10-CM | POA: Diagnosis not present

## 2016-06-18 DIAGNOSIS — F329 Major depressive disorder, single episode, unspecified: Secondary | ICD-10-CM | POA: Diagnosis not present

## 2016-06-18 DIAGNOSIS — Z87891 Personal history of nicotine dependence: Secondary | ICD-10-CM | POA: Diagnosis not present

## 2016-06-18 DIAGNOSIS — Z299 Encounter for prophylactic measures, unspecified: Secondary | ICD-10-CM | POA: Diagnosis not present

## 2016-06-18 DIAGNOSIS — Z6841 Body Mass Index (BMI) 40.0 and over, adult: Secondary | ICD-10-CM | POA: Diagnosis not present

## 2016-06-18 DIAGNOSIS — I4891 Unspecified atrial fibrillation: Secondary | ICD-10-CM | POA: Diagnosis not present

## 2016-06-18 DIAGNOSIS — J209 Acute bronchitis, unspecified: Secondary | ICD-10-CM | POA: Diagnosis not present

## 2016-06-18 DIAGNOSIS — J029 Acute pharyngitis, unspecified: Secondary | ICD-10-CM | POA: Diagnosis not present

## 2016-06-18 DIAGNOSIS — I10 Essential (primary) hypertension: Secondary | ICD-10-CM | POA: Diagnosis not present

## 2016-07-10 ENCOUNTER — Encounter: Payer: Self-pay | Admitting: Cardiology

## 2016-07-10 ENCOUNTER — Ambulatory Visit (INDEPENDENT_AMBULATORY_CARE_PROVIDER_SITE_OTHER): Payer: Medicare Other | Admitting: Cardiology

## 2016-07-10 ENCOUNTER — Encounter: Payer: Self-pay | Admitting: *Deleted

## 2016-07-10 VITALS — BP 128/72 | HR 60 | Ht 65.0 in | Wt 267.2 lb

## 2016-07-10 DIAGNOSIS — I4891 Unspecified atrial fibrillation: Secondary | ICD-10-CM | POA: Diagnosis not present

## 2016-07-10 DIAGNOSIS — I2510 Atherosclerotic heart disease of native coronary artery without angina pectoris: Secondary | ICD-10-CM

## 2016-07-10 DIAGNOSIS — I10 Essential (primary) hypertension: Secondary | ICD-10-CM

## 2016-07-10 DIAGNOSIS — I4819 Other persistent atrial fibrillation: Secondary | ICD-10-CM | POA: Insufficient documentation

## 2016-07-10 MED ORDER — DILTIAZEM HCL ER COATED BEADS 240 MG PO CP24: 240.0000 mg | ORAL_CAPSULE | Freq: Every day | ORAL | 1 refills | Status: DC

## 2016-07-10 NOTE — Patient Instructions (Signed)
Your physician wants you to follow-up in: [place] DR. BRANCH You will receive a reminder letter in the mail two months in advance. If you don't receive a letter, please call our office to schedule the follow-up appointment.  Your physician has recommended you make the following change in your medication:   INCREASE DILTIAZEM 240 MG DAILY  Thank you for choosing [facility]!!

## 2016-07-10 NOTE — Progress Notes (Signed)
Clinical Summary [name] is a 71 y.o.female seen as new patient, former patient of Dr Remer Macho at [facility] cardiology. She is referred by Dr Manuella Ghazi for the folllowing medical problems.   1. PAF - diagnosed 12/2014 during hospital admission at [facility] - echo at that time per clinic notes 55-60%.  - CHADS2Vasc score 2, she was started on xarelto at that time.  - has had some recent palpitations. Occurs 2-3 times a week, variable in duration. No other associated symptoms. No specific triggers.  - no bleeding troubles on xarelto.   2. HTN - she denies this history  3. CAD - nonobstructive disease by cath 2006 according to prior clinic notes - no recent chest pain Past Medical History:  Diagnosis Date  . Arthritis    pain and oa  right knee and pain in rt leg and foot--also pain left knee-but right is worse  . Atrial fibrillation ([place])   . Back pain    lumbar bulging disk  . Contact dermatitis    underside of left leg--always in the same spots--comes and goes  . Diverticulosis   . GERD (gastroesophageal reflux disease)    diet control  . Hemorrhoids      Allergies  Allergen Reactions  . Codeine Shortness Of Breath and Itching  . Mobic [Meloxicam]     Stomach iritation     Current Outpatient Prescriptions  Medication Sig Dispense Refill  . albuterol (PROVENTIL HFA;VENTOLIN HFA) 108 (90 Base) MCG/ACT inhaler Inhale 2 puffs into the lungs every 6 (six) hours as needed for wheezing or shortness of breath. 1 Inhaler 0  . benzonatate (TESSALON) 100 MG capsule Take 1 capsule (100 mg total) by mouth every 8 (eight) hours. 21 capsule 0  . citalopram (CELEXA) 20 MG tablet Take 1 tablet by mouth daily.    Marland Kitchen diltiazem (CARDIZEM CD) 180 MG 24 hr capsule Take 180 mg by mouth daily.    Marland Kitchen doxycycline (VIBRAMYCIN) 100 MG capsule Take 1 capsule (100 mg total) by mouth 2 (two) times daily. 20 capsule 0  . omeprazole (PRILOSEC) 20 MG capsule Take 20 mg by mouth daily.    .  rivaroxaban (XARELTO) 20 MG TABS tablet Take 20 mg by mouth daily with supper.     No current facility-administered medications for this visit.      Past Surgical History:  Procedure Laterality Date  . CATARACT EXTRACTION W/PHACO Left 02/22/2015   Procedure: CATARACT EXTRACTION PHACO AND INTRAOCULAR LENS PLACEMENT LEFT EYE CDE=5.41;  Surgeon: Tonny Noele Icenhour, MD;  Location: [facility];  Service: Ophthalmology;  Laterality: Left;  . CATARACT EXTRACTION W/PHACO Right 03/26/2015   Procedure: CATARACT EXTRACTION PHACO AND INTRAOCULAR LENS PLACEMENT (IOC);  Surgeon: Tonny Joyia Riehle, MD;  Location: [facility];  Service: Ophthalmology;  Laterality: Right;  CDE:5.01  . CHOLECYSTECTOMY    . surgery for broken right elbow Right   . TOTAL KNEE ARTHROPLASTY  11/03/2011   Procedure: TOTAL KNEE ARTHROPLASTY;  Surgeon: Gearlean Alf, MD;  Location: [facility];  Service: Orthopedics;  Laterality: Right;  . TUBAL LIGATION       Allergies  Allergen Reactions  . Codeine Shortness Of Breath and Itching  . Mobic [Meloxicam]     Stomach iritation      Family History  Problem Relation Age of Onset  . Diabetes Mother   . Stroke Mother   . Stroke Father      Social History [name] reports that she quit smoking about 20  years ago. She has a 38.00 pack-year smoking history. She has never used smokeless tobacco. [name] reports that she drinks about 2.4 oz of alcohol per week .   Review of Systems CONSTITUTIONAL: No weight loss, fever, chills, weakness or fatigue.  HEENT: Eyes: No visual loss, blurred vision, double vision or yellow sclerae.No hearing loss, sneezing, congestion, runny nose or sore throat.  SKIN: No rash or itching.  CARDIOVASCULAR: per hpi RESPIRATORY: No shortness of breath, cough or sputum.  GASTROINTESTINAL: No anorexia, nausea, vomiting or diarrhea. No abdominal pain or blood.  GENITOURINARY: No burning on urination, no polyuria NEUROLOGICAL: No headache, dizziness, syncope, paralysis,  ataxia, numbness or tingling in the extremities. No change in bowel or bladder control.  MUSCULOSKELETAL: No muscle, back pain, joint pain or stiffness.  LYMPHATICS: No enlarged nodes. No history of splenectomy.  PSYCHIATRIC: No history of depression or anxiety.  ENDOCRINOLOGIC: No reports of sweating, cold or heat intolerance. No polyuria or polydipsia.  Marland Kitchen   Physical Examination Vitals:   07/10/16 1023 07/10/16 1034  BP: (!) 142/69 128/72  Pulse: 60 60   Vitals:   07/10/16 1023  Weight: 267 lb 3.2 oz (121.2 kg)  Height: 5\' 5"  (1.651 m)    Gen: resting comfortably, no acute distress HEENT: no scleral icterus, pupils equal round and reactive, no palptable cervical adenopathy,  CV: RRR, no m/r/g, no jvd Resp: Clear to auscultation bilaterally GI: abdomen is soft, non-tender, non-distended, normal bowel sounds, no hepatosplenomegaly MSK: extremities are warm, no edema.  Skin: warm, no rash Neuro:  no focal deficits Psych: appropriate affect   Diagnostic Studies 03/2005 cath DESCRIPTION OF OPERATION:  After giving informed written consent, the  patient was brought to the cardiac catheterization.  The right groin was  shaved, prepped and draped in the usual sterile fashion.  Anesthesia  monitoring was established and using the modified Seldinger technique, a #6  French arterial sheath was inserted in the right femoral artery.  Six French  diagnostic catheters then used to perform diagnostic angiography.   1.  The left main is a large vessel with no significant disease.  2.  The LAD is a large vessel, which is tortuous in its proximal segment,      gives rise to a large septal perforator as well as medium-sized diagonal      Clarrissa Shimkus.  The LAD has no significant disease.  3.  The first diagonal is a medium-sized vessel which bifurcates distally,      with no significant disease.  4.  The left circumflex is a large vessel that courses in the AV groove and      gives rise to one  obtuse marginal Roniesha Hollingshead.  The AV groove circumflex has      no significant disease.  5.  The first OM is a medium to large-sized vessel with no significant      disease.  6.  The right coronary artery is a large vessel, which is dominant and gives      rise to a PDA as well as a posterolateral Diem Dicocco.  There is no      significant disease in the RCA, PDA or posterolateral Jorrell Kuster.   Left ventriculogram:  There is a preserved EF at 60%.   Abdominal aortogram:  There was no evidence of significant renal artery  stenosis.   HEMODYNAMICS:  Central arterial pressure 150/8, LV systemic pressure 151/11,  LVEDP of 20.   CONCLUSION:  1.  No significant coronary artery disease.  2.  Normal left ventricular systolic function.  3.  No evidence of renal artery stenosis.  4.  Systemic hypertension.  5.  Elevated left ventricular end-diastolic pressure.      Assessment and Plan  1. PAF - recent palpitations, we will increase dilt to 240mg  daily - continue anticoagulation for stroke prevention - EKG in clinic shows SR, heart rate 60s   2. HTN - at goal, she will continue current meds  3. CAD - no recent symptoms - continue current meds  4. Morbid obesity - BMI 44 - counseled on diet, exercise, and weight loss   F/u 6 months. Request labs from pcp  Arnoldo Lenis, M.D.

## 2016-07-29 DIAGNOSIS — Z6841 Body Mass Index (BMI) 40.0 and over, adult: Secondary | ICD-10-CM | POA: Diagnosis not present

## 2016-07-29 DIAGNOSIS — J209 Acute bronchitis, unspecified: Secondary | ICD-10-CM | POA: Diagnosis not present

## 2016-07-29 DIAGNOSIS — I10 Essential (primary) hypertension: Secondary | ICD-10-CM | POA: Diagnosis not present

## 2016-07-29 DIAGNOSIS — I4891 Unspecified atrial fibrillation: Secondary | ICD-10-CM | POA: Diagnosis not present

## 2016-07-29 DIAGNOSIS — Z299 Encounter for prophylactic measures, unspecified: Secondary | ICD-10-CM | POA: Diagnosis not present

## 2016-07-29 DIAGNOSIS — F329 Major depressive disorder, single episode, unspecified: Secondary | ICD-10-CM | POA: Diagnosis not present

## 2016-07-29 DIAGNOSIS — Z87891 Personal history of nicotine dependence: Secondary | ICD-10-CM | POA: Diagnosis not present

## 2016-09-30 DIAGNOSIS — Z9049 Acquired absence of other specified parts of digestive tract: Secondary | ICD-10-CM | POA: Diagnosis not present

## 2016-09-30 DIAGNOSIS — I482 Chronic atrial fibrillation: Secondary | ICD-10-CM | POA: Diagnosis not present

## 2016-09-30 DIAGNOSIS — Z9851 Tubal ligation status: Secondary | ICD-10-CM | POA: Diagnosis not present

## 2016-09-30 DIAGNOSIS — M1990 Unspecified osteoarthritis, unspecified site: Secondary | ICD-10-CM | POA: Diagnosis not present

## 2016-09-30 DIAGNOSIS — R079 Chest pain, unspecified: Secondary | ICD-10-CM | POA: Diagnosis not present

## 2016-09-30 DIAGNOSIS — Z79899 Other long term (current) drug therapy: Secondary | ICD-10-CM | POA: Diagnosis not present

## 2016-09-30 DIAGNOSIS — K219 Gastro-esophageal reflux disease without esophagitis: Secondary | ICD-10-CM | POA: Diagnosis not present

## 2016-09-30 DIAGNOSIS — Z299 Encounter for prophylactic measures, unspecified: Secondary | ICD-10-CM | POA: Diagnosis not present

## 2016-09-30 DIAGNOSIS — Z7901 Long term (current) use of anticoagulants: Secondary | ICD-10-CM | POA: Diagnosis not present

## 2016-09-30 DIAGNOSIS — Z87891 Personal history of nicotine dependence: Secondary | ICD-10-CM | POA: Diagnosis not present

## 2016-09-30 DIAGNOSIS — Z886 Allergy status to analgesic agent status: Secondary | ICD-10-CM | POA: Diagnosis not present

## 2016-09-30 DIAGNOSIS — R0789 Other chest pain: Secondary | ICD-10-CM | POA: Diagnosis not present

## 2016-09-30 DIAGNOSIS — I4891 Unspecified atrial fibrillation: Secondary | ICD-10-CM | POA: Diagnosis not present

## 2016-09-30 DIAGNOSIS — Z6841 Body Mass Index (BMI) 40.0 and over, adult: Secondary | ICD-10-CM | POA: Diagnosis not present

## 2016-10-01 DIAGNOSIS — M1990 Unspecified osteoarthritis, unspecified site: Secondary | ICD-10-CM | POA: Diagnosis not present

## 2016-10-01 DIAGNOSIS — Z886 Allergy status to analgesic agent status: Secondary | ICD-10-CM | POA: Diagnosis not present

## 2016-10-01 DIAGNOSIS — I482 Chronic atrial fibrillation: Secondary | ICD-10-CM | POA: Diagnosis not present

## 2016-10-01 DIAGNOSIS — Z79899 Other long term (current) drug therapy: Secondary | ICD-10-CM | POA: Diagnosis not present

## 2016-10-01 DIAGNOSIS — R0789 Other chest pain: Secondary | ICD-10-CM | POA: Diagnosis not present

## 2016-10-01 DIAGNOSIS — Z7901 Long term (current) use of anticoagulants: Secondary | ICD-10-CM | POA: Diagnosis not present

## 2016-10-03 ENCOUNTER — Encounter: Payer: Self-pay | Admitting: *Deleted

## 2016-10-03 ENCOUNTER — Ambulatory Visit (INDEPENDENT_AMBULATORY_CARE_PROVIDER_SITE_OTHER): Payer: Medicare Other | Admitting: Cardiology

## 2016-10-03 ENCOUNTER — Encounter: Payer: Self-pay | Admitting: Cardiology

## 2016-10-03 VITALS — BP 135/74 | HR 64 | Ht 65.0 in | Wt 272.0 lb

## 2016-10-03 DIAGNOSIS — I10 Essential (primary) hypertension: Secondary | ICD-10-CM | POA: Diagnosis not present

## 2016-10-03 DIAGNOSIS — I4891 Unspecified atrial fibrillation: Secondary | ICD-10-CM

## 2016-10-03 DIAGNOSIS — I2510 Atherosclerotic heart disease of native coronary artery without angina pectoris: Secondary | ICD-10-CM | POA: Diagnosis not present

## 2016-10-03 NOTE — Patient Instructions (Signed)
Your physician recommends that you schedule a follow-up appointment in: 1 MONTH WITH DR BRANCH  Your physician recommends that you continue on your current medications as directed. Please refer to the Current Medication list given to you today.  Thank you for choosing [facility]!!    

## 2016-10-03 NOTE — Progress Notes (Signed)
Clinical Summary Ms. Schalk is a 71 y.o.female seen today for follow up of the following medical problems.   1. PAF - diagnosed 12/2014 during hospital admission at [facility] - echo at that time per clinic notes 55-60%.  - CHADS2Vasc score 2, she was started on xarelto at that time.   - seen in hospital 09/2016 with "flutters" in chest.Records are not available at this time. Can have some orthostatic symptoms - she reports when dilt 180 to 240 increased fluttering and SOB worsened   2. HTN - she denies this history though mentioned in prior notes  3. CAD - nonobstructive disease by cath 2006 according to prior clinic notes - admit 09/2016 to [facility] with chest pain primarily related to palpitations.   4. Dizziness - primarily with standing - crystal light x 1/2 gallon, water glasses x 2, occasioanl diet pepsi -  Past Medical History:  Diagnosis Date  . Arthritis    pain and oa  right knee and pain in rt leg and foot--also pain left knee-but right is worse  . Atrial fibrillation ([place])   . Back pain    lumbar bulging disk  . Contact dermatitis    underside of left leg--always in the same spots--comes and goes  . Diverticulosis   . GERD (gastroesophageal reflux disease)    diet control  . Hemorrhoids      Allergies  Allergen Reactions  . Codeine Shortness Of Breath and Itching  . Mobic [Meloxicam]     Stomach iritation     Current Outpatient Prescriptions  Medication Sig Dispense Refill  . citalopram (CELEXA) 20 MG tablet Take 1 tablet by mouth daily.    Marland Kitchen diltiazem (CARDIZEM CD) 240 MG 24 hr capsule Take 1 capsule (240 mg total) by mouth daily. 90 capsule 1  . omeprazole (PRILOSEC) 20 MG capsule Take 20 mg by mouth daily.    . rivaroxaban (XARELTO) 20 MG TABS tablet Take 20 mg by mouth daily with supper.     No current facility-administered medications for this visit.      Past Surgical History:  Procedure Laterality Date  . CATARACT  EXTRACTION W/PHACO Left 02/22/2015   Procedure: CATARACT EXTRACTION PHACO AND INTRAOCULAR LENS PLACEMENT LEFT EYE CDE=5.41;  Surgeon: Tonny Ellina Sivertsen, MD;  Location: [facility];  Service: Ophthalmology;  Laterality: Left;  . CATARACT EXTRACTION W/PHACO Right 03/26/2015   Procedure: CATARACT EXTRACTION PHACO AND INTRAOCULAR LENS PLACEMENT (IOC);  Surgeon: Tonny Kean Gautreau, MD;  Location: [facility];  Service: Ophthalmology;  Laterality: Right;  CDE:5.01  . CHOLECYSTECTOMY    . surgery for broken right elbow Right   . TOTAL KNEE ARTHROPLASTY  11/03/2011   Procedure: TOTAL KNEE ARTHROPLASTY;  Surgeon: Gearlean Alf, MD;  Location: [facility];  Service: Orthopedics;  Laterality: Right;  . TUBAL LIGATION       Allergies  Allergen Reactions  . Codeine Shortness Of Breath and Itching  . Mobic [Meloxicam]     Stomach iritation      Family History  Problem Relation Age of Onset  . Diabetes Mother   . Stroke Mother   . Stroke Father      Social History Ms. Gupton reports that she quit smoking about 20 years ago. She started smoking about 59 years ago. She has a 38.00 pack-year smoking history. She has never used smokeless tobacco. Ms. Calabria reports that she drinks about 2.4 oz of alcohol per week .   Review of Systems CONSTITUTIONAL: No weight  loss, fever, chills, weakness or fatigue.  HEENT: Eyes: No visual loss, blurred vision, double vision or yellow sclerae.No hearing loss, sneezing, congestion, runny nose or sore throat.  SKIN: No rash or itching.  CARDIOVASCULAR: per hpi RESPIRATORY: No shortness of breath, cough or sputum.  GASTROINTESTINAL: No anorexia, nausea, vomiting or diarrhea. No abdominal pain or blood.  GENITOURINARY: No burning on urination, no polyuria NEUROLOGICAL: per hpi MUSCULOSKELETAL: No muscle, back pain, joint pain or stiffness.  LYMPHATICS: No enlarged nodes. No history of splenectomy.  PSYCHIATRIC: No history of depression or anxiety.  ENDOCRINOLOGIC: No reports of  sweating, cold or heat intolerance. No polyuria or polydipsia.  Marland Kitchen   Physical Examination Vitals:   10/03/16 0831  BP: 135/74  Pulse: 64   Vitals:   10/03/16 0831  Weight: 272 lb (123.4 kg)  Height: 5\' 5"  (1.651 m)    Gen: resting comfortably, no acute distress HEENT: no scleral icterus, pupils equal round and reactive, no palptable cervical adenopathy,  CV: RRR, no m/r/g, no jvd Resp: Clear to auscultation bilaterally GI: abdomen is soft, non-tender, non-distended, normal bowel sounds, no hepatosplenomegaly MSK: extremities are warm, no edema.  Skin: warm, no rash Neuro:  no focal deficits Psych: appropriate affect   Diagnostic Studies 03/2005 cath DESCRIPTION OF OPERATION: After giving informed written consent, the patient was brought to the cardiac catheterization. The right groin was shaved, prepped and draped in the usual sterile fashion. Anesthesia monitoring was established and using the modified Seldinger technique, a #6 French arterial sheath was inserted in the right femoral artery. Six French diagnostic catheters then used to perform diagnostic angiography.  1. The left main is a large vessel with no significant disease. 2. The LAD is a large vessel, which is tortuous in its proximal segment, gives rise to a large septal perforator as well as medium-sized diagonal Venisha Boehning. The LAD has no significant disease. 3. The first diagonal is a medium-sized vessel which bifurcates distally, with no significant disease. 4. The left circumflex is a large vessel that courses in the AV groove and gives rise to one obtuse marginal Baltasar Twilley. The AV groove circumflex has no significant disease. 5. The first OM is a medium to large-sized vessel with no significant disease. 6. The right coronary artery is a large vessel, which is dominant and gives rise to a PDA as well as a posterolateral Harley Mccartney. There is  no significant disease in the RCA, PDA or posterolateral Yen Wandell.  Left ventriculogram: There is a preserved EF at 60%.  Abdominal aortogram: There was no evidence of significant renal artery stenosis.  HEMODYNAMICS: Central arterial pressure 150/8, LV systemic pressure 151/11, LVEDP of 20.  CONCLUSION: 1. No significant coronary artery disease. 2. Normal left ventricular systolic function. 3. No evidence of renal artery stenosis. 4. Systemic hypertension. 5. Elevated left ventricular end-diastolic pressure.     Assessment and Plan  1. PAF - ongoing palpitations. She has an event monitor pending through her pcp -symptoms not improved with increased dilt, has some increased orthostatic symptoms - f/u heart monitor results, may need to consider rhythm strategy  2. HTN - her bp is at goal, continue current meds  3. CAD - no recent symptoms - she will continue current meds    F/u 1 month.       Arnoldo Lenis, M.D.

## 2016-10-06 DIAGNOSIS — R002 Palpitations: Secondary | ICD-10-CM | POA: Diagnosis not present

## 2016-10-20 DIAGNOSIS — E039 Hypothyroidism, unspecified: Secondary | ICD-10-CM | POA: Diagnosis not present

## 2016-10-20 DIAGNOSIS — Z299 Encounter for prophylactic measures, unspecified: Secondary | ICD-10-CM | POA: Diagnosis not present

## 2016-10-20 DIAGNOSIS — Z96659 Presence of unspecified artificial knee joint: Secondary | ICD-10-CM | POA: Diagnosis not present

## 2016-10-20 DIAGNOSIS — I10 Essential (primary) hypertension: Secondary | ICD-10-CM | POA: Diagnosis not present

## 2016-10-20 DIAGNOSIS — Z713 Dietary counseling and surveillance: Secondary | ICD-10-CM | POA: Diagnosis not present

## 2016-10-20 DIAGNOSIS — I8393 Asymptomatic varicose veins of bilateral lower extremities: Secondary | ICD-10-CM | POA: Diagnosis not present

## 2016-10-20 DIAGNOSIS — I4891 Unspecified atrial fibrillation: Secondary | ICD-10-CM | POA: Diagnosis not present

## 2016-10-20 DIAGNOSIS — F329 Major depressive disorder, single episode, unspecified: Secondary | ICD-10-CM | POA: Diagnosis not present

## 2016-10-20 DIAGNOSIS — Z6841 Body Mass Index (BMI) 40.0 and over, adult: Secondary | ICD-10-CM | POA: Diagnosis not present

## 2016-10-24 DIAGNOSIS — R002 Palpitations: Secondary | ICD-10-CM | POA: Diagnosis not present

## 2016-10-27 DIAGNOSIS — R002 Palpitations: Secondary | ICD-10-CM | POA: Diagnosis not present

## 2016-10-31 ENCOUNTER — Ambulatory Visit: Payer: Medicare Other | Admitting: Cardiology

## 2016-11-10 ENCOUNTER — Telehealth: Payer: Self-pay | Admitting: Cardiology

## 2016-11-10 NOTE — Telephone Encounter (Signed)
[place] thought her sob she was having was from the long walk. Per patient, 2 hours later the sob continued. Patient called 911 and was assessed by EMS and was told that her lungs were fine but that she was in a-fib. Patient was advised to go to the ED for and evaluation but patient refused stating that she would see her heart doctor when she gets back home. Patient c/o feeling fatigued and a feeling like she was dehydrated but said she is drinking plenty of fluids. Patient said that sometimes she feels her heart racing. While on the phone with patient, she denied a-fib symptoms. Patient took her BP and heart rate while on the phone with nurse and her BP is 130/66 & HR is 85. Patient was given an appt in the  A-fib clinic for Wednesday 11/12/16 @9 :30 am. No c/o chest pain or dizziness. Patient advised that if her symptoms got worse that she needed to go to the ED for an evaluation. Patient verbalized understanding of plan.

## 2016-11-10 NOTE — Telephone Encounter (Signed)
Mrs. Loeffler called stating that she needs to see Dr.Branch. States that she is very short of breath. She called 911 last evening and they told her she was in A.Fib . They did an EKG and advised her to go To the ER but she refused. Please call   [phone] .

## 2016-11-12 ENCOUNTER — Other Ambulatory Visit (HOSPITAL_COMMUNITY): Payer: Self-pay | Admitting: Nurse Practitioner

## 2016-11-12 ENCOUNTER — Ambulatory Visit (HOSPITAL_COMMUNITY)
Admission: RE | Admit: 2016-11-12 | Discharge: 2016-11-12 | Disposition: A | Discharge status: Home / Self Care | Payer: Medicare Other | Source: Ambulatory Visit | Attending: Nurse Practitioner | Admitting: Nurse Practitioner

## 2016-11-12 ENCOUNTER — Encounter (HOSPITAL_COMMUNITY): Payer: Self-pay | Admitting: Nurse Practitioner

## 2016-11-12 VITALS — BP 110/66 | HR 96 | Ht 65.0 in | Wt 273.4 lb

## 2016-11-12 DIAGNOSIS — E669 Obesity, unspecified: Secondary | ICD-10-CM | POA: Insufficient documentation

## 2016-11-12 DIAGNOSIS — Z87891 Personal history of nicotine dependence: Secondary | ICD-10-CM | POA: Insufficient documentation

## 2016-11-12 DIAGNOSIS — I2510 Atherosclerotic heart disease of native coronary artery without angina pectoris: Secondary | ICD-10-CM | POA: Insufficient documentation

## 2016-11-12 DIAGNOSIS — K219 Gastro-esophageal reflux disease without esophagitis: Secondary | ICD-10-CM | POA: Diagnosis not present

## 2016-11-12 DIAGNOSIS — I10 Essential (primary) hypertension: Secondary | ICD-10-CM | POA: Diagnosis not present

## 2016-11-12 DIAGNOSIS — Z79899 Other long term (current) drug therapy: Secondary | ICD-10-CM | POA: Diagnosis not present

## 2016-11-12 DIAGNOSIS — I4891 Unspecified atrial fibrillation: Secondary | ICD-10-CM | POA: Diagnosis present

## 2016-11-12 DIAGNOSIS — Z7901 Long term (current) use of anticoagulants: Secondary | ICD-10-CM | POA: Insufficient documentation

## 2016-11-12 DIAGNOSIS — I4819 Other persistent atrial fibrillation: Secondary | ICD-10-CM

## 2016-11-12 DIAGNOSIS — I481 Persistent atrial fibrillation: Secondary | ICD-10-CM | POA: Diagnosis not present

## 2016-11-12 LAB — BASIC METABOLIC PANEL
ANION GAP: 9 (ref 5–15)
BUN: 7 mg/dL (ref 6–20)
CHLORIDE: 105 mmol/L (ref 101–111)
CO2: 24 mmol/L (ref 22–32)
Calcium: 8.9 mg/dL (ref 8.9–10.3)
Creatinine, Ser: 0.88 mg/dL (ref 0.44–1.00)
Glucose, Bld: 126 mg/dL — ABNORMAL HIGH (ref 65–99)
POTASSIUM: 3.9 mmol/L (ref 3.5–5.1)
SODIUM: 138 mmol/L (ref 135–145)

## 2016-11-12 LAB — CBC
HCT: 42.1 % (ref 36.0–46.0)
HEMOGLOBIN: 13.5 g/dL (ref 12.0–15.0)
MCH: 27.8 pg (ref 26.0–34.0)
MCHC: 32.1 g/dL (ref 30.0–36.0)
MCV: 86.6 fL (ref 78.0–100.0)
Platelets: 378 10*3/uL (ref 150–400)
RBC: 4.86 MIL/uL (ref 3.87–5.11)
RDW: 14.3 % (ref 11.5–15.5)
WBC: 8.5 10*3/uL (ref 4.0–10.5)

## 2016-11-12 LAB — TSH: TSH: 4.404 u[IU]/mL (ref 0.350–4.500)

## 2016-11-12 MED ORDER — FUROSEMIDE 20 MG PO TABS: ORAL_TABLET | ORAL | 0 refills | Status: DC

## 2016-11-12 NOTE — Progress Notes (Signed)
Primary Care Physician: Monico Blitz, MD Referring Physician: [facility] triage Cardiologist: Dr. Doran Durand Brooke Luna is a 71 y.o. female with a h/o HTN, CAD, obesity that is in the afib clinic for evaluation. She has h/o of paroxsymal afib since 2016, which usually returns to SR in a few hours when she has an episode. She has been in afib now since last Saturday. This makes her feel fatigued and winded. It is rate controlled with HR at 60-100 bpm depending on activity. She has also noted increase in LLE over the last week. She is on xarelto 20 mg qd for a chadsvasc score of at least 4. She has not had any missed doses for at least the last 3 weeks.  She does not smoke or drink alcohol, minimum caffeine. She does snore. She states that she sleeps in a recliner because of her back and she doesn't breath well on her back.   Today, she denies symptoms of palpitations, chest pain, shortness of breath, orthopnea, PND, lower extremity edema, dizziness, presyncope, syncope, or neurologic sequela.+ for fatigue , dyspnea, mild LLE. The patient is tolerating medications without difficulties and is otherwise without complaint today.   Past Medical History:  Diagnosis Date  . Arthritis    pain and oa  right knee and pain in rt leg and foot--also pain left knee-but right is worse  . Atrial fibrillation ([place])   . Back pain    lumbar bulging disk  . Contact dermatitis    underside of left leg--always in the same spots--comes and goes  . Diverticulosis   . GERD (gastroesophageal reflux disease)    diet control  . Hemorrhoids    Past Surgical History:  Procedure Laterality Date  . CATARACT EXTRACTION W/PHACO Left 02/22/2015   Procedure: CATARACT EXTRACTION PHACO AND INTRAOCULAR LENS PLACEMENT LEFT EYE CDE=5.41;  Surgeon: Tonny Branch, MD;  Location: [facility];  Service: Ophthalmology;  Laterality: Left;  . CATARACT EXTRACTION W/PHACO Right 03/26/2015   Procedure: CATARACT  EXTRACTION PHACO AND INTRAOCULAR LENS PLACEMENT (IOC);  Surgeon: Tonny Branch, MD;  Location: [facility];  Service: Ophthalmology;  Laterality: Right;  CDE:5.01  . CHOLECYSTECTOMY    . surgery for broken right elbow Right   . TOTAL KNEE ARTHROPLASTY  11/03/2011   Procedure: TOTAL KNEE ARTHROPLASTY;  Surgeon: Gearlean Alf, MD;  Location: [facility];  Service: Orthopedics;  Laterality: Right;  . TUBAL LIGATION      Current Outpatient Prescriptions  Medication Sig Dispense Refill  . citalopram (CELEXA) 20 MG tablet Take 1 tablet by mouth daily.    Marland Kitchen diltiazem (CARDIZEM CD) 240 MG 24 hr capsule Take 1 capsule (240 mg total) by mouth daily. 90 capsule 1  . omeprazole (PRILOSEC) 20 MG capsule Take 20 mg by mouth daily.    . rivaroxaban (XARELTO) 20 MG TABS tablet Take 20 mg by mouth daily with supper.    . furosemide (LASIX) 20 MG tablet Take one tablet daily for the next 3 days 10 tablet 0   No current facility-administered medications for this encounter.     Allergies  Allergen Reactions  . Codeine Shortness Of Breath and Itching  . Mobic [Meloxicam]     Stomach iritation    Social History   Social History  . Marital status: Married    Spouse name: N/A  . Number of children: N/A  . Years of education: N/A   Occupational History  . Not on file.   Social History  Main Topics  . Smoking status: Former Smoker    Packs/day: 1.00    Years: 38.00    Start date: 06/25/1957    Quit date: 04/28/1996  . Smokeless tobacco: Never Used     Comment: quit smoking 1998  . Alcohol use 2.4 oz/week    4 Cans of beer per week     Comment: couple of beers per week  . Drug use: No  . Sexual activity: Not on file   Other Topics Concern  . Not on file   Social History Narrative  . No narrative on file    Family History  Problem Relation Age of Onset  . Diabetes Mother   . Stroke Mother   . Stroke Father     ROS- All systems are reviewed and negative except as per the HPI above  Physical  Exam: Vitals:   11/12/16 0929  BP: 110/66  Pulse: 96  Weight: 273 lb 6.4 oz (124 kg)  Height: 5\' 5"  (1.651 m)   Wt Readings from Last 3 Encounters:  11/12/16 273 lb 6.4 oz (124 kg)  10/03/16 272 lb (123.4 kg)  07/10/16 267 lb 3.2 oz (121.2 kg)    Labs: Lab Results  Component Value Date   NA 138 11/12/2016   K 3.9 11/12/2016   CL 105 11/12/2016   CO2 24 11/12/2016   GLUCOSE 126 (H) 11/12/2016   BUN 7 11/12/2016   CREATININE 0.88 11/12/2016   CALCIUM 8.9 11/12/2016   Lab Results  Component Value Date   INR 0.95 10/23/2011   No results found for: CHOL, HDL, LDLCALC, TRIG   GEN- The patient is well appearing, alert and oriented x 3 today.   Head- normocephalic, atraumatic Eyes-  Sclera clear, conjunctiva pink Ears- hearing intact Oropharynx- clear Neck- supple, no JVP Lymph- no cervical lymphadenopathy Lungs- Clear to ausculation bilaterally, normal work of breathing Heart-irregular rate and rhythm, no murmurs, rubs or gallops, PMI not laterally displaced GI- soft, NT, ND, + BS Extremities- no clubbing, cyanosis, left lower extremity edema worse than right(1+/trace)   MS- no significant deformity or atrophy Skin- no rash or lesion Psych- euthymic mood, full affect Neuro- strength and sensation are intact  EKG-atrial fibrillation, 96 bpm, qrs int 86 ms, qtc 462 ms  Epic records reviewed    Assessment and Plan: 1. Persistent afib  General discussion afib, triggers and management Echo  ordered for pt's convenience in [place] Sleep study ordered Lasix 20 mg x 3 days only Tsh, bmet, cbc today Discussed management with pt and she would like to try cardioversion, risk vrs benefit  explained, scheduled for 7/26 States no missed doses of xarelto x at least 3 weeks Continue diltiazem 240 mg daily  F/u in afib clinic in one week s/p cardioversion   [name], [facility] [address] [place], [place]  [address] [phone]

## 2016-11-12 NOTE — Patient Instructions (Signed)
Your cardioversion is scheduled for : Thursday 11/20/2016 at 1:00 pm Arrive at the [facility] and go to admitting at 11:30 Do Not eat or drink anything after midnight the night prior to your procedure. Take all your medications with a sip of water prior to arrival. Do NOT miss any doses of your blood thinner. You will NOT be able to drive home after your procedure.    We will be working on getting your echo scheduled in [place] at Dr. Devra Dopp office  We will also work on getting you a sleep study scheduled.    Take furosemide (lasix) 20 mg 1 tablet a day for the next 3 days.  This Rx has been sent to your pharmacy.

## 2016-11-13 ENCOUNTER — Telehealth: Payer: Self-pay | Admitting: *Deleted

## 2016-11-13 DIAGNOSIS — I4891 Unspecified atrial fibrillation: Secondary | ICD-10-CM

## 2016-11-13 NOTE — Telephone Encounter (Signed)
-----   Message from Iona Hansen, [place] sent at 11/12/2016  4:22 PM EDT ----- Regarding: sleep study Brooke Luna would like a sleep study scheduled for this patient.  Thank you!  Concepcion Living, CMA

## 2016-11-13 NOTE — Telephone Encounter (Addendum)
Per Roderic Palau  Split night study ordered. Patient understands her study scheduled for Monday January 12 2017. Informed patient of upcoming sleep study and patient understanding was verbalized. You will need to report to the [facility] Emergency Department. Their address is [address] [address] Patient understands she will receive a sleep packet in a week or so. Patient understands to call if she does not receive the sleep packet in a timely manner. Patient agrees with treatment and thanked me for call.

## 2016-11-17 ENCOUNTER — Other Ambulatory Visit (HOSPITAL_COMMUNITY): Payer: Self-pay | Admitting: *Deleted

## 2016-11-20 ENCOUNTER — Encounter (HOSPITAL_COMMUNITY): Payer: Self-pay | Admitting: *Deleted

## 2016-11-20 ENCOUNTER — Encounter (HOSPITAL_COMMUNITY): Payer: Self-pay | Admitting: Anesthesiology

## 2016-11-20 ENCOUNTER — Encounter (HOSPITAL_COMMUNITY)
Admission: RE | Disposition: A | Discharge status: Home / Self Care | Payer: Self-pay | Source: Ambulatory Visit | Attending: Cardiology

## 2016-11-20 ENCOUNTER — Ambulatory Visit (HOSPITAL_COMMUNITY)
Admission: RE | Admit: 2016-11-20 | Discharge: 2016-11-20 | Disposition: A | Discharge status: Home / Self Care | Payer: Medicare Other | Source: Ambulatory Visit | Attending: Cardiology | Admitting: Cardiology

## 2016-11-20 DIAGNOSIS — Z5309 Procedure and treatment not carried out because of other contraindication: Secondary | ICD-10-CM | POA: Diagnosis not present

## 2016-11-20 DIAGNOSIS — Z01810 Encounter for preprocedural cardiovascular examination: Secondary | ICD-10-CM | POA: Insufficient documentation

## 2016-11-20 DIAGNOSIS — I4891 Unspecified atrial fibrillation: Secondary | ICD-10-CM | POA: Diagnosis not present

## 2016-11-20 SURGERY — CANCELLED PROCEDURE
Anesthesia: Monitor Anesthesia Care

## 2016-11-20 MED ORDER — SODIUM CHLORIDE 0.9 % IV SOLN: INTRAVENOUS | Status: DC

## 2016-11-20 NOTE — Interval H&P Note (Signed)
History and Physical Interval Note:  11/20/2016 10:54 AM  Brooke Luna  has presented today for surgery, with the diagnosis of AFIB  The various methods of treatment have been discussed with the patient and family. After consideration of risks, benefits and other options for treatment, the patient has consented to  Procedure(s): CARDIOVERSION (N/A) as a surgical intervention .  The patient's history has been reviewed, patient examined, no change in status, stable for surgery.  I have reviewed the patient's chart and labs.  Questions were answered to the patient's satisfaction.     [name]

## 2016-11-20 NOTE — Progress Notes (Signed)
Patient presented to Endo unit for planned DCCV.  Patient found to be in NSR by EKG and DCCV was cancelled and patient discharged home.

## 2016-11-20 NOTE — H&P (View-Only) (Signed)
Primary Care Physician: Monico Blitz, MD Referring Physician: [facility] triage Cardiologist: Dr. Doran Durand Brooke Luna is a 71 y.o. female with a h/o HTN, CAD, obesity that is in the afib clinic for evaluation. She has h/o of paroxsymal afib since 2016, which usually returns to SR in a few hours when she has an episode. She has been in afib now since last Saturday. This makes her feel fatigued and winded. It is rate controlled with HR at 60-100 bpm depending on activity. She has also noted increase in LLE over the last week. She is on xarelto 20 mg qd for a chadsvasc score of at least 4. She has not had any missed doses for at least the last 3 weeks.  She does not smoke or drink alcohol, minimum caffeine. She does snore. She states that she sleeps in a recliner because of her back and she doesn't breath well on her back.   Today, she denies symptoms of palpitations, chest pain, shortness of breath, orthopnea, PND, lower extremity edema, dizziness, presyncope, syncope, or neurologic sequela.+ for fatigue , dyspnea, mild LLE. The patient is tolerating medications without difficulties and is otherwise without complaint today.   Past Medical History:  Diagnosis Date  . Arthritis    pain and oa  right knee and pain in rt leg and foot--also pain left knee-but right is worse  . Atrial fibrillation ([place])   . Back pain    lumbar bulging disk  . Contact dermatitis    underside of left leg--always in the same spots--comes and goes  . Diverticulosis   . GERD (gastroesophageal reflux disease)    diet control  . Hemorrhoids    Past Surgical History:  Procedure Laterality Date  . CATARACT EXTRACTION W/PHACO Left 02/22/2015   Procedure: CATARACT EXTRACTION PHACO AND INTRAOCULAR LENS PLACEMENT LEFT EYE CDE=5.41;  Surgeon: Tonny Branch, MD;  Location: [facility];  Service: Ophthalmology;  Laterality: Left;  . CATARACT EXTRACTION W/PHACO Right 03/26/2015   Procedure: CATARACT  EXTRACTION PHACO AND INTRAOCULAR LENS PLACEMENT (IOC);  Surgeon: Tonny Branch, MD;  Location: [facility];  Service: Ophthalmology;  Laterality: Right;  CDE:5.01  . CHOLECYSTECTOMY    . surgery for broken right elbow Right   . TOTAL KNEE ARTHROPLASTY  11/03/2011   Procedure: TOTAL KNEE ARTHROPLASTY;  Surgeon: Gearlean Alf, MD;  Location: [facility];  Service: Orthopedics;  Laterality: Right;  . TUBAL LIGATION      Current Outpatient Prescriptions  Medication Sig Dispense Refill  . citalopram (CELEXA) 20 MG tablet Take 1 tablet by mouth daily.    Marland Kitchen diltiazem (CARDIZEM CD) 240 MG 24 hr capsule Take 1 capsule (240 mg total) by mouth daily. 90 capsule 1  . omeprazole (PRILOSEC) 20 MG capsule Take 20 mg by mouth daily.    . rivaroxaban (XARELTO) 20 MG TABS tablet Take 20 mg by mouth daily with supper.    . furosemide (LASIX) 20 MG tablet Take one tablet daily for the next 3 days 10 tablet 0   No current facility-administered medications for this encounter.     Allergies  Allergen Reactions  . Codeine Shortness Of Breath and Itching  . Mobic [Meloxicam]     Stomach iritation    Social History   Social History  . Marital status: Married    Spouse name: N/A  . Number of children: N/A  . Years of education: N/A   Occupational History  . Not on file.   Social History  Main Topics  . Smoking status: Former Smoker    Packs/day: 1.00    Years: 38.00    Start date: 06/25/1957    Quit date: 04/28/1996  . Smokeless tobacco: Never Used     Comment: quit smoking 1998  . Alcohol use 2.4 oz/week    4 Cans of beer per week     Comment: couple of beers per week  . Drug use: No  . Sexual activity: Not on file   Other Topics Concern  . Not on file   Social History Narrative  . No narrative on file    Family History  Problem Relation Age of Onset  . Diabetes Mother   . Stroke Mother   . Stroke Father     ROS- All systems are reviewed and negative except as per the HPI above  Physical  Exam: Vitals:   11/12/16 0929  BP: 110/66  Pulse: 96  Weight: 273 lb 6.4 oz (124 kg)  Height: 5\' 5"  (1.651 m)   Wt Readings from Last 3 Encounters:  11/12/16 273 lb 6.4 oz (124 kg)  10/03/16 272 lb (123.4 kg)  07/10/16 267 lb 3.2 oz (121.2 kg)    Labs: Lab Results  Component Value Date   NA 138 11/12/2016   K 3.9 11/12/2016   CL 105 11/12/2016   CO2 24 11/12/2016   GLUCOSE 126 (H) 11/12/2016   BUN 7 11/12/2016   CREATININE 0.88 11/12/2016   CALCIUM 8.9 11/12/2016   Lab Results  Component Value Date   INR 0.95 10/23/2011   No results found for: CHOL, HDL, LDLCALC, TRIG   GEN- The patient is well appearing, alert and oriented x 3 today.   Head- normocephalic, atraumatic Eyes-  Sclera clear, conjunctiva pink Ears- hearing intact Oropharynx- clear Neck- supple, no JVP Lymph- no cervical lymphadenopathy Lungs- Clear to ausculation bilaterally, normal work of breathing Heart-irregular rate and rhythm, no murmurs, rubs or gallops, PMI not laterally displaced GI- soft, NT, ND, + BS Extremities- no clubbing, cyanosis, left lower extremity edema worse than right(1+/trace)   MS- no significant deformity or atrophy Skin- no rash or lesion Psych- euthymic mood, full affect Neuro- strength and sensation are intact  EKG-atrial fibrillation, 96 bpm, qrs int 86 ms, qtc 462 ms  Epic records reviewed    Assessment and Plan: 1. Persistent afib  General discussion afib, triggers and management Echo  ordered for pt's convenience in [place] Sleep study ordered Lasix 20 mg x 3 days only Tsh, bmet, cbc today Discussed management with pt and she would like to try cardioversion, risk vrs benefit  explained, scheduled for 7/26 States no missed doses of xarelto x at least 3 weeks Continue diltiazem 240 mg daily  F/u in afib clinic in one week s/p cardioversion   Butch Penny C. Sevilla Murtagh, [facility] [address] [place], [place]  [address] [phone]

## 2016-11-20 NOTE — Anesthesia Preprocedure Evaluation (Deleted)
Anesthesia Evaluation  Patient identified by MRN, date of birth, ID band  Reviewed: Allergy & Precautions, NPO status , Patient's Chart, lab work & pertinent test results  Airway Mallampati: I  TM Distance: >3 FB     Dental  (+) Edentulous Upper   Pulmonary former smoker,    breath sounds clear to auscultation       Cardiovascular + Peripheral Vascular Disease  + dysrhythmias Atrial Fibrillation  Rhythm:Regular Rate:Normal     Neuro/Psych    GI/Hepatic GERD  ,  Endo/Other    Renal/GU      Musculoskeletal  (+) Arthritis , Osteoarthritis,    Abdominal   Peds  Hematology  (+) JEHOVAH'S WITNESS  Anesthesia Other Findings   Reproductive/Obstetrics                             Anesthesia Physical  Anesthesia Plan  ASA: III  Anesthesia Plan: MAC   Post-op Pain Management:    Induction: Intravenous  PONV Risk Score and Plan: 2 and Ondansetron, Dexamethasone and Treatment may vary due to age or medical condition  Airway Management Planned: Nasal Cannula, Natural Airway and Mask  Additional Equipment:   Intra-op Plan:   Post-operative Plan:   Informed Consent: I have reviewed the patients History and Physical, chart, labs and discussed the procedure including the risks, benefits and alternatives for the proposed anesthesia with the patient or authorized representative who has indicated his/her understanding and acceptance.     Plan Discussed with:   Anesthesia Plan Comments:         Anesthesia Quick Evaluation

## 2016-11-20 NOTE — Progress Notes (Signed)
Patient admitted to Endo unit and placed on monitor. Patients rhythm appears to be in NSR. EKG obtained and NSR confirmed with Dr. Radford Pax. Patient told to keep taking medications and prescribed and keep follow up appt next week. Patient was cancelled prior to entering into the procedure room.

## 2016-11-27 ENCOUNTER — Inpatient Hospital Stay (HOSPITAL_COMMUNITY): Admission: RE | Admit: 2016-11-27 | Payer: Medicare Other | Source: Ambulatory Visit | Admitting: Nurse Practitioner

## 2016-12-02 ENCOUNTER — Ambulatory Visit (HOSPITAL_COMMUNITY)
Admission: RE | Admit: 2016-12-02 | Discharge: 2016-12-02 | Disposition: A | Discharge status: Home / Self Care | Payer: Medicare Other | Source: Ambulatory Visit | Attending: Nurse Practitioner | Admitting: Nurse Practitioner

## 2016-12-02 ENCOUNTER — Encounter (HOSPITAL_COMMUNITY): Payer: Self-pay | Admitting: Nurse Practitioner

## 2016-12-02 VITALS — BP 126/64 | HR 66 | Ht 65.0 in | Wt 267.2 lb

## 2016-12-02 DIAGNOSIS — Z96651 Presence of right artificial knee joint: Secondary | ICD-10-CM | POA: Diagnosis not present

## 2016-12-02 DIAGNOSIS — K219 Gastro-esophageal reflux disease without esophagitis: Secondary | ICD-10-CM | POA: Diagnosis not present

## 2016-12-02 DIAGNOSIS — I2510 Atherosclerotic heart disease of native coronary artery without angina pectoris: Secondary | ICD-10-CM | POA: Diagnosis not present

## 2016-12-02 DIAGNOSIS — Z7901 Long term (current) use of anticoagulants: Secondary | ICD-10-CM | POA: Diagnosis not present

## 2016-12-02 DIAGNOSIS — E669 Obesity, unspecified: Secondary | ICD-10-CM | POA: Insufficient documentation

## 2016-12-02 DIAGNOSIS — I10 Essential (primary) hypertension: Secondary | ICD-10-CM | POA: Diagnosis not present

## 2016-12-02 DIAGNOSIS — Z87891 Personal history of nicotine dependence: Secondary | ICD-10-CM | POA: Diagnosis not present

## 2016-12-02 DIAGNOSIS — Z79899 Other long term (current) drug therapy: Secondary | ICD-10-CM | POA: Diagnosis not present

## 2016-12-02 DIAGNOSIS — I480 Paroxysmal atrial fibrillation: Secondary | ICD-10-CM

## 2016-12-02 DIAGNOSIS — I4891 Unspecified atrial fibrillation: Secondary | ICD-10-CM | POA: Diagnosis present

## 2016-12-02 MED ORDER — FUROSEMIDE 20 MG PO TABS: 20.0000 mg | ORAL_TABLET | Freq: Every day | ORAL | 6 refills | Status: DC

## 2016-12-02 NOTE — Patient Instructions (Signed)
Your physician has recommended you make the following change in your medication:  1)Start lasix 20mg  once a day  Have BMET drawn and blood pressure check in 1 week. Fax is [phone]

## 2016-12-02 NOTE — Progress Notes (Signed)
Primary Care Physician: Monico Blitz, MD Referring Physician: [facility] triage Cardiologist: Dr. Doran Durand Brooke Luna is a 71 y.o. female with a h/o HTN, CAD, obesity that is in the afib clinic for evaluation. She has h/o of paroxsymal afib since 2016, which usually returns to SR in a few hours when she has an episode. She has been in afib now since last Saturday. This makes her feel fatigued and winded. It is rate controlled with HR at 60-100 bpm depending on activity. She has also noted increase in LLE over the last week. She is on xarelto 20 mg qd for a chadsvasc score of at least 4. She has not had any missed doses for at least the last 3 weeks.  She does not smoke or drink alcohol, minimum caffeine. She does snore. She states that she sleeps in a recliner because of her back and she doesn't breath well on her back.   F/u in afib clinic 8/7. She was scheduled for cardioversion but presented in SR. I had given her lasix to use 3 days in a row and she states it really helped with lower extremity edema. She is continuing with SR. Pending an echo in [place] and sleep study at [facility]. She has felt more energy since going back to SR.  Today, she denies symptoms of palpitations, chest pain, shortness of breath, orthopnea, PND, lower extremity edema, dizziness, presyncope, syncope, or neurologic sequela.+ for fatigue , dyspnea, mild LLE. The patient is tolerating medications without difficulties and is otherwise without complaint today.   Past Medical History:  Diagnosis Date  . Arthritis    pain and oa  right knee and pain in rt leg and foot--also pain left knee-but right is worse  . Atrial fibrillation ([place])   . Back pain    lumbar bulging disk  . Contact dermatitis    underside of left leg--always in the same spots--comes and goes  . Diverticulosis   . GERD (gastroesophageal reflux disease)    diet control  . Hemorrhoids    Past Surgical History:  Procedure  Laterality Date  . CATARACT EXTRACTION W/PHACO Left 02/22/2015   Procedure: CATARACT EXTRACTION PHACO AND INTRAOCULAR LENS PLACEMENT LEFT EYE CDE=5.41;  Surgeon: Tonny Branch, MD;  Location: [facility];  Service: Ophthalmology;  Laterality: Left;  . CATARACT EXTRACTION W/PHACO Right 03/26/2015   Procedure: CATARACT EXTRACTION PHACO AND INTRAOCULAR LENS PLACEMENT (IOC);  Surgeon: Tonny Branch, MD;  Location: [facility];  Service: Ophthalmology;  Laterality: Right;  CDE:5.01  . CHOLECYSTECTOMY    . surgery for broken right elbow Right   . TOTAL KNEE ARTHROPLASTY  11/03/2011   Procedure: TOTAL KNEE ARTHROPLASTY;  Surgeon: Gearlean Alf, MD;  Location: [facility];  Service: Orthopedics;  Laterality: Right;  . TUBAL LIGATION      Current Outpatient Prescriptions  Medication Sig Dispense Refill  . citalopram (CELEXA) 20 MG tablet Take 1 tablet by mouth daily.    Marland Kitchen diltiazem (CARDIZEM CD) 240 MG 24 hr capsule Take 1 capsule (240 mg total) by mouth daily. 90 capsule 1  . omeprazole (PRILOSEC) 20 MG capsule Take 20 mg by mouth daily.    . rivaroxaban (XARELTO) 20 MG TABS tablet Take 20 mg by mouth daily with supper.    . furosemide (LASIX) 20 MG tablet Take 1 tablet (20 mg total) by mouth daily. 30 tablet 6   No current facility-administered medications for this encounter.     Allergies  Allergen Reactions  .  Codeine Shortness Of Breath and Itching  . Mobic [Meloxicam]     Stomach iritation    Social History   Social History  . Marital status: Married    Spouse name: N/A  . Number of children: N/A  . Years of education: N/A   Occupational History  . Not on file.   Social History Main Topics  . Smoking status: Former Smoker    Packs/day: 1.00    Years: 38.00    Start date: 06/25/1957    Quit date: 04/28/1996  . Smokeless tobacco: Never Used     Comment: quit smoking 1998  . Alcohol use 2.4 oz/week    4 Cans of beer per week     Comment: couple of beers per week  . Drug use: No  . Sexual  activity: Not on file   Other Topics Concern  . Not on file   Social History Narrative  . No narrative on file    Family History  Problem Relation Age of Onset  . Diabetes Mother   . Stroke Mother   . Stroke Father     ROS- All systems are reviewed and negative except as per the HPI above  Physical Exam: Vitals:   12/02/16 1539  BP: 126/64  Pulse: 66  Weight: 267 lb 3.2 oz (121.2 kg)  Height: 5\' 5"  (1.651 m)   Wt Readings from Last 3 Encounters:  12/02/16 267 lb 3.2 oz (121.2 kg)  11/12/16 273 lb 6.4 oz (124 kg)  10/03/16 272 lb (123.4 kg)    Labs: Lab Results  Component Value Date   NA 138 11/12/2016   K 3.9 11/12/2016   CL 105 11/12/2016   CO2 24 11/12/2016   GLUCOSE 126 (H) 11/12/2016   BUN 7 11/12/2016   CREATININE 0.88 11/12/2016   CALCIUM 8.9 11/12/2016   Lab Results  Component Value Date   INR 0.95 10/23/2011   No results found for: CHOL, HDL, LDLCALC, TRIG   GEN- The patient is well appearing, alert and oriented x 3 today.   Head- normocephalic, atraumatic Eyes-  Sclera clear, conjunctiva pink Ears- hearing intact Oropharynx- clear Neck- supple, no JVP Lymph- no cervical lymphadenopathy Lungs- Clear to ausculation bilaterally, normal work of breathing Heart- regular rate and rhythm, no murmurs, rubs or gallops, PMI not laterally displaced GI- soft, NT, ND, + BS Extremities- no clubbing, cyanosis, left lower extremity edema worse than right(1+/trace)  MS- no significant deformity or atrophy Skin- no rash or lesion Psych- euthymic mood, full affect Neuro- strength and sensation are intact  EKG- NSR at 66 bpm, pr int 140 ms, qrs int 84 ms, qtc 463 ms Epic records reviewed    Assessment and Plan: 1. Paroxysmal afib  Self converted to SR and holding in SR If afib returns will probably need antiarrythmic Echo pending for pt's convenience in [place] Sleep study ordered at [facility] Lasix 20 mg daily for LLE, showed improvement with 3 days of  drug Pt will f/u with PCP with bmet/BP check in one week and have labs faxed to me Continue xarelto for chadsvasc score of 4 Continue diltiazem 240 mg daily  F/u as needed in afib clinic   [place]. Bernard Donahoo, [facility] [address] [place], [place] [address] [phone]

## 2016-12-03 DIAGNOSIS — Z299 Encounter for prophylactic measures, unspecified: Secondary | ICD-10-CM | POA: Diagnosis not present

## 2016-12-03 DIAGNOSIS — I4891 Unspecified atrial fibrillation: Secondary | ICD-10-CM | POA: Diagnosis not present

## 2016-12-03 DIAGNOSIS — F329 Major depressive disorder, single episode, unspecified: Secondary | ICD-10-CM | POA: Diagnosis not present

## 2016-12-03 DIAGNOSIS — F419 Anxiety disorder, unspecified: Secondary | ICD-10-CM | POA: Diagnosis not present

## 2016-12-03 DIAGNOSIS — Z6841 Body Mass Index (BMI) 40.0 and over, adult: Secondary | ICD-10-CM | POA: Diagnosis not present

## 2016-12-03 DIAGNOSIS — I10 Essential (primary) hypertension: Secondary | ICD-10-CM | POA: Diagnosis not present

## 2016-12-03 DIAGNOSIS — E559 Vitamin D deficiency, unspecified: Secondary | ICD-10-CM | POA: Diagnosis not present

## 2016-12-03 DIAGNOSIS — I8393 Asymptomatic varicose veins of bilateral lower extremities: Secondary | ICD-10-CM | POA: Diagnosis not present

## 2016-12-03 DIAGNOSIS — E7800 Pure hypercholesterolemia, unspecified: Secondary | ICD-10-CM | POA: Diagnosis not present

## 2016-12-03 DIAGNOSIS — R600 Localized edema: Secondary | ICD-10-CM | POA: Diagnosis not present

## 2016-12-04 ENCOUNTER — Other Ambulatory Visit: Payer: Self-pay

## 2016-12-04 ENCOUNTER — Ambulatory Visit (INDEPENDENT_AMBULATORY_CARE_PROVIDER_SITE_OTHER): Payer: Medicare Other

## 2016-12-04 DIAGNOSIS — I481 Persistent atrial fibrillation: Secondary | ICD-10-CM | POA: Diagnosis not present

## 2016-12-04 DIAGNOSIS — I4819 Other persistent atrial fibrillation: Secondary | ICD-10-CM

## 2016-12-22 DIAGNOSIS — Z1211 Encounter for screening for malignant neoplasm of colon: Secondary | ICD-10-CM | POA: Diagnosis not present

## 2016-12-22 DIAGNOSIS — F419 Anxiety disorder, unspecified: Secondary | ICD-10-CM | POA: Diagnosis not present

## 2016-12-22 DIAGNOSIS — E7800 Pure hypercholesterolemia, unspecified: Secondary | ICD-10-CM | POA: Diagnosis not present

## 2016-12-22 DIAGNOSIS — I4891 Unspecified atrial fibrillation: Secondary | ICD-10-CM | POA: Diagnosis not present

## 2016-12-22 DIAGNOSIS — Z1389 Encounter for screening for other disorder: Secondary | ICD-10-CM | POA: Diagnosis not present

## 2016-12-22 DIAGNOSIS — Z7189 Other specified counseling: Secondary | ICD-10-CM | POA: Diagnosis not present

## 2016-12-22 DIAGNOSIS — Z79899 Other long term (current) drug therapy: Secondary | ICD-10-CM | POA: Diagnosis not present

## 2016-12-22 DIAGNOSIS — I10 Essential (primary) hypertension: Secondary | ICD-10-CM | POA: Diagnosis not present

## 2016-12-22 DIAGNOSIS — Z299 Encounter for prophylactic measures, unspecified: Secondary | ICD-10-CM | POA: Diagnosis not present

## 2016-12-22 DIAGNOSIS — E559 Vitamin D deficiency, unspecified: Secondary | ICD-10-CM | POA: Diagnosis not present

## 2016-12-22 DIAGNOSIS — F329 Major depressive disorder, single episode, unspecified: Secondary | ICD-10-CM | POA: Diagnosis not present

## 2016-12-22 DIAGNOSIS — Z0000 Encounter for general adult medical examination without abnormal findings: Secondary | ICD-10-CM | POA: Diagnosis not present

## 2016-12-22 DIAGNOSIS — E039 Hypothyroidism, unspecified: Secondary | ICD-10-CM | POA: Diagnosis not present

## 2016-12-22 DIAGNOSIS — Z6841 Body Mass Index (BMI) 40.0 and over, adult: Secondary | ICD-10-CM | POA: Diagnosis not present

## 2016-12-23 ENCOUNTER — Telehealth (HOSPITAL_COMMUNITY): Payer: Self-pay | Admitting: *Deleted

## 2016-12-23 NOTE — Telephone Encounter (Signed)
Labs received from PCP office on 12/22/16. BMET and CBC within normal limits. Creatinine 0.81 BUN 8 K4.1

## 2017-01-12 ENCOUNTER — Ambulatory Visit: Payer: Medicare Other | Attending: Nurse Practitioner | Admitting: Cardiology

## 2017-01-12 ENCOUNTER — Encounter (HOSPITAL_BASED_OUTPATIENT_CLINIC_OR_DEPARTMENT_OTHER): Payer: Medicare Other

## 2017-01-12 DIAGNOSIS — G4733 Obstructive sleep apnea (adult) (pediatric): Secondary | ICD-10-CM | POA: Diagnosis not present

## 2017-01-12 DIAGNOSIS — I4891 Unspecified atrial fibrillation: Secondary | ICD-10-CM | POA: Diagnosis not present

## 2017-01-13 DIAGNOSIS — E2839 Other primary ovarian failure: Secondary | ICD-10-CM | POA: Diagnosis not present

## 2017-01-13 NOTE — Procedures (Signed)
   Patient Name: Brooke Luna, Brooke Luna Date: 01/12/2017 Gender: Female D.O.B: 01-25-1946 Age (years): 83 Referring Provider: Sherran Needs Height (inches): 58 Interpreting Physician: [name] MD, ABSM Weight (lbs): 272 RPSGT: Rosebud Poles BMI: 45 MRN: [id number] Neck Size: 17.00  CLINICAL INFORMATION Sleep Study Type: NPSG  Indication for sleep study: N/A  Epworth Sleepiness Score: 13  SLEEP STUDY TECHNIQUE As per the AASM Manual for the Scoring of Sleep and Associated Events v2.3 (April 2016) with a hypopnea requiring 4% desaturations.  The channels recorded and monitored were frontal, central and occipital EEG, electrooculogram (EOG), submentalis EMG (chin), nasal and oral airflow, thoracic and abdominal wall motion, anterior tibialis EMG, snore microphone, electrocardiogram, and pulse oximetry.  MEDICATIONS Medications self-administered by patient taken the night of the study : [place] The study was initiated at 99:99:99 and ended at 99:99:99.  Sleep onset time was 20.0 minutes and the sleep efficiency was 222.2%. The total sleep time was 380.0 minutes.  Stage REM latency was 5.5 minutes.  The patient spent 5.55% of the night in stage N1 sleep, 5.55% in stage N2 sleep, 5.55% in stage N3 and 5.55% in REM.  Alpha intrusion was absent.  Supine sleep was 5.55%.  RESPIRATORY PARAMETERS The overall apnea/hypopnea index (AHI) was 5.5 per hour. There were 5 total apneas, including 5 obstructive, 5 central and 5 mixed apneas. There were 5 hypopneas and 5 RERAs.  The AHI during Stage REM sleep was 5.5 per hour.  AHI while supine was 5.5 per hour.  The mean oxygen saturation was 100.00%. The minimum SpO2 during sleep was 100.00%.  Loud snoring was noted during this study.  CARDIAC DATA The 2 lead EKG demonstrated atrial fibrillation. The mean heart rate was 100.00 beats per minute.   LEG MOVEMENT DATA The total PLMS were 5 with a  resulting PLMS index of 5.55. Associated arousal with leg movement index was 5.5 .  IMPRESSIONS - Minimal obstructive sleep apnea occurred during this study (AHI = 5.5/h) occurring primarily during supine sleep. - Mild central sleep apnea occurred during this study (CAI = 5.5/h). - The patient had minimal or no oxygen desaturation during the study (Min O2 = 100.00%) - The patient snored with Loud snoring volume. - EKG findings include Atrial Fibrillation. - Mild periodic limb movements of sleep occurred during the study. Associated arousals were significant.  DIAGNOSIS - Obstructive Sleep Apnea (327.23 [G47.33 ICD-10])  RECOMMENDATIONS - Very mild obstructive sleep apnea occurring primarily in supine position. - Recommend avoiding sleeping supine.  - Avoid alcohol, sedatives and other CNS depressants that may worsen sleep apnea and disrupt normal sleep architecture. - Sleep hygiene should be reviewed to assess factors that may improve sleep quality. - Weight management and regular exercise should be initiated or continued if appropriate. - Consider referral to ENT for evaluation of possible surgical causes of snoring.   [place], American Board of Sleep Medicine  ELECTRONICALLY SIGNED ON:  01/13/2017, 8:47 AM [place] PH: [phone]   FX: [phone] [place]

## 2017-01-19 ENCOUNTER — Other Ambulatory Visit: Payer: Self-pay | Admitting: Neurosurgery

## 2017-01-19 DIAGNOSIS — M533 Sacrococcygeal disorders, not elsewhere classified: Secondary | ICD-10-CM

## 2017-01-20 ENCOUNTER — Telehealth: Payer: Self-pay | Admitting: *Deleted

## 2017-01-20 NOTE — Telephone Encounter (Signed)
-----   Message from Sueanne Margarita, MD sent at 01/13/2017  8:49 AM EDT ----- Please let patient know that sleep study showed minimal sleep apnea and not enough to treat.  Mainly occurs when sleeping on her back so avoid supine sleep

## 2017-01-20 NOTE — Telephone Encounter (Signed)
LMTCB

## 2017-01-21 DIAGNOSIS — I4891 Unspecified atrial fibrillation: Secondary | ICD-10-CM | POA: Diagnosis not present

## 2017-01-21 DIAGNOSIS — I2510 Atherosclerotic heart disease of native coronary artery without angina pectoris: Secondary | ICD-10-CM | POA: Diagnosis not present

## 2017-01-21 DIAGNOSIS — I10 Essential (primary) hypertension: Secondary | ICD-10-CM | POA: Diagnosis not present

## 2017-01-21 DIAGNOSIS — E7800 Pure hypercholesterolemia, unspecified: Secondary | ICD-10-CM | POA: Diagnosis not present

## 2017-01-22 NOTE — Telephone Encounter (Signed)
Return call: Informed patient of sleep study results and patient understanding was verbalized. Patient understands her sleep study showed minimal sleep apnea and was not enough to treat. Patient understands it mainly occurs when sleeping on her back. Patient has been encouraged not to sleep supine. Patient states she sleeps in a recliner because she can not sleep in her bed. Patient was grateful for the call and thanked me.

## 2017-01-22 NOTE — Telephone Encounter (Deleted)
-----   Message from Brooke Margarita, MD sent at 01/13/2017  8:49 AM EDT ----- Please let patient know that sleep study showed minimal sleep apnea and not enough to treat.  Mainly occurs when sleeping on her back so avoid supine sleep

## 2017-02-16 DIAGNOSIS — I4891 Unspecified atrial fibrillation: Secondary | ICD-10-CM | POA: Diagnosis not present

## 2017-02-16 DIAGNOSIS — I10 Essential (primary) hypertension: Secondary | ICD-10-CM | POA: Diagnosis not present

## 2017-02-16 DIAGNOSIS — I2510 Atherosclerotic heart disease of native coronary artery without angina pectoris: Secondary | ICD-10-CM | POA: Diagnosis not present

## 2017-02-16 DIAGNOSIS — E7800 Pure hypercholesterolemia, unspecified: Secondary | ICD-10-CM | POA: Diagnosis not present

## 2017-02-23 ENCOUNTER — Ambulatory Visit
Admission: RE | Admit: 2017-02-23 | Discharge: 2017-02-23 | Disposition: A | Discharge status: Home / Self Care | Payer: Medicare Other | Source: Ambulatory Visit | Attending: Neurosurgery | Admitting: Neurosurgery

## 2017-02-23 DIAGNOSIS — M533 Sacrococcygeal disorders, not elsewhere classified: Secondary | ICD-10-CM

## 2017-02-23 MED ORDER — GADOBENATE DIMEGLUMINE 529 MG/ML IV SOLN
20.0000 mL | Freq: Once | INTRAVENOUS | Status: AC | PRN
  Administered 2017-02-23: 20 mL via INTRAVENOUS

## 2017-02-27 ENCOUNTER — Other Ambulatory Visit: Payer: Self-pay | Admitting: Cardiology

## 2017-02-27 DIAGNOSIS — M533 Sacrococcygeal disorders, not elsewhere classified: Secondary | ICD-10-CM | POA: Diagnosis not present

## 2017-03-29 ENCOUNTER — Other Ambulatory Visit: Payer: Self-pay | Admitting: Cardiology

## 2017-04-13 DIAGNOSIS — Z6841 Body Mass Index (BMI) 40.0 and over, adult: Secondary | ICD-10-CM | POA: Diagnosis not present

## 2017-04-13 DIAGNOSIS — Z713 Dietary counseling and surveillance: Secondary | ICD-10-CM | POA: Diagnosis not present

## 2017-04-13 DIAGNOSIS — J329 Chronic sinusitis, unspecified: Secondary | ICD-10-CM | POA: Diagnosis not present

## 2017-04-27 ENCOUNTER — Other Ambulatory Visit: Payer: Self-pay | Admitting: Cardiology

## 2017-05-24 ENCOUNTER — Other Ambulatory Visit: Payer: Self-pay | Admitting: Cardiology

## 2017-06-06 ENCOUNTER — Other Ambulatory Visit: Payer: Self-pay | Admitting: Cardiology

## 2017-06-22 ENCOUNTER — Other Ambulatory Visit: Payer: Self-pay | Admitting: Cardiology

## 2017-07-07 ENCOUNTER — Other Ambulatory Visit: Payer: Self-pay | Admitting: Cardiology

## 2017-07-21 ENCOUNTER — Other Ambulatory Visit: Payer: Self-pay | Admitting: Cardiology

## 2017-07-21 MED ORDER — DILTIAZEM HCL ER COATED BEADS 240 MG PO CP24: ORAL_CAPSULE | ORAL | 0 refills | Status: DC

## 2017-07-21 NOTE — Telephone Encounter (Signed)
Medication sent to pharmacy  

## 2017-08-06 ENCOUNTER — Other Ambulatory Visit (HOSPITAL_COMMUNITY): Payer: Self-pay | Admitting: Nurse Practitioner

## 2017-08-10 ENCOUNTER — Ambulatory Visit (INDEPENDENT_AMBULATORY_CARE_PROVIDER_SITE_OTHER): Payer: Medicare Other | Admitting: Cardiology

## 2017-08-10 ENCOUNTER — Encounter: Payer: Self-pay | Admitting: Cardiology

## 2017-08-10 VITALS — BP 118/60 | HR 68 | Ht 65.0 in | Wt 269.2 lb

## 2017-08-10 DIAGNOSIS — I2510 Atherosclerotic heart disease of native coronary artery without angina pectoris: Secondary | ICD-10-CM

## 2017-08-10 DIAGNOSIS — I10 Essential (primary) hypertension: Secondary | ICD-10-CM

## 2017-08-10 DIAGNOSIS — I4891 Unspecified atrial fibrillation: Secondary | ICD-10-CM | POA: Diagnosis not present

## 2017-08-10 NOTE — Progress Notes (Signed)
Clinical Summary Ms. Riess is a 72 y.o.female seen today for follow up of the following medical problems.   1. PAF - diagnosed 12/2014 during hospital admission at [facility] - echo at that time per clinic notes 55-60%.  - CHADS2Vasc score 2, she was started on xarelto at that time. - seen in hospital 09/2016 with "flutters" in chest.Records are not available at this time. Can have some orthostatic symptoms - she reports when dilt 180 to 240 increased fluttering and SOB worsened  - followed in afib clinic., Continued rate control, from last clinic note if recurrence of afib could consider antiarrhythmics  - no recent palpitations -  compliatn with meds.   2. HTN - she denies this history though mentioned in prior notes - compliant with meds  3. CAD - nonobstructive disease by cath 2006according to prior clinic notes - admit 09/2016 to [facility] with chest pain primarily related to palpitations.   - chest pain, mild irritiating pains just a few secods. No specific pattern, nonexertional     Past Medical History:  Diagnosis Date  . Arthritis    pain and oa  right knee and pain in rt leg and foot--also pain left knee-but right is worse  . Atrial fibrillation ([place])   . Back pain    lumbar bulging disk  . Contact dermatitis    underside of left leg--always in the same spots--comes and goes  . Diverticulosis   . GERD (gastroesophageal reflux disease)    diet control  . Hemorrhoids      Allergies  Allergen Reactions  . Codeine Shortness Of Breath and Itching  . Mobic [Meloxicam]     Stomach iritation     Current Outpatient Medications  Medication Sig Dispense Refill  . citalopram (CELEXA) 20 MG tablet Take 1 tablet by mouth daily.    Marland Kitchen diltiazem (CARDIZEM CD) 240 MG 24 hr capsule TAKE 1 CAPSULE BY MOUTH EVERY DAY 90 capsule 0  . furosemide (LASIX) 20 MG tablet TAKE 1 TABLET BY MOUTH EVERY DAY 30 tablet 3  . omeprazole (PRILOSEC) 20 MG capsule Take  20 mg by mouth daily.    . rivaroxaban (XARELTO) 20 MG TABS tablet Take 20 mg by mouth daily with supper.     No current facility-administered medications for this visit.      Past Surgical History:  Procedure Laterality Date  . CATARACT EXTRACTION W/PHACO Left 02/22/2015   Procedure: CATARACT EXTRACTION PHACO AND INTRAOCULAR LENS PLACEMENT LEFT EYE CDE=5.41;  Surgeon: Tonny Tehani Mersman, MD;  Location: [facility];  Service: Ophthalmology;  Laterality: Left;  . CATARACT EXTRACTION W/PHACO Right 03/26/2015   Procedure: CATARACT EXTRACTION PHACO AND INTRAOCULAR LENS PLACEMENT (IOC);  Surgeon: Tonny Arizona Sorn, MD;  Location: [facility];  Service: Ophthalmology;  Laterality: Right;  CDE:5.01  . CHOLECYSTECTOMY    . surgery for broken right elbow Right   . TOTAL KNEE ARTHROPLASTY  11/03/2011   Procedure: TOTAL KNEE ARTHROPLASTY;  Surgeon: Gearlean Alf, MD;  Location: [facility];  Service: Orthopedics;  Laterality: Right;  . TUBAL LIGATION       Allergies  Allergen Reactions  . Codeine Shortness Of Breath and Itching  . Mobic [Meloxicam]     Stomach iritation      Family History  Problem Relation Age of Onset  . Diabetes Mother   . Stroke Mother   . Stroke Father      Social History Ms. Buzby reports that she quit smoking about 21 years  ago. She started smoking about 60 years ago. She has a 38.00 pack-year smoking history. She has never used smokeless tobacco. Ms. Bhandari reports that she drinks about 2.4 oz of alcohol per week.   Review of Systems CONSTITUTIONAL: No weight loss, fever, chills, weakness or fatigue.  HEENT: Eyes: No visual loss, blurred vision, double vision or yellow sclerae.No hearing loss, sneezing, congestion, runny nose or sore throat.  SKIN: No rash or itching.  CARDIOVASCULAR: per hpi RESPIRATORY: No shortness of breath, cough or sputum.  GASTROINTESTINAL: No anorexia, nausea, vomiting or diarrhea. No abdominal pain or blood.  GENITOURINARY: No burning on urination, no  polyuria NEUROLOGICAL: No headache, dizziness, syncope, paralysis, ataxia, numbness or tingling in the extremities. No change in bowel or bladder control.  MUSCULOSKELETAL: No muscle, back pain, joint pain or stiffness.  LYMPHATICS: No enlarged nodes. No history of splenectomy.  PSYCHIATRIC: No history of depression or anxiety.  ENDOCRINOLOGIC: No reports of sweating, cold or heat intolerance. No polyuria or polydipsia.  Marland Kitchen   Physical Examination Vitals:   08/10/17 1513  BP: 118/60  Pulse: 68  SpO2: 95%   Vitals:   08/10/17 1513  Weight: 269 lb 3.2 oz (122.1 kg)  Height: 5\' 5"  (1.651 m)    Gen: resting comfortably, no acute distress HEENT: no scleral icterus, pupils equal round and reactive, no palptable cervical adenopathy,  CV: RRR, no m/r/g, no jvd Resp: Clear to auscultation bilaterally GI: abdomen is soft, non-tender, non-distended, normal bowel sounds, no hepatosplenomegaly MSK: extremities are warm, no edema.  Skin: warm, no rash Neuro:  no focal deficits Psych: appropriate affect   Diagnostic Studies  12/2016 sleep study IMPRESSIONS - Minimal obstructive sleep apnea occurred during this study (AHI = 5.5/h) occurring primarily during supine sleep. - Mild central sleep apnea occurred during this study (CAI = 5.5/h). - The patient had minimal or no oxygen desaturation during the study (Min O2 = 100.00%) - The patient snored with Loud snoring volume. - EKG findings include Atrial Fibrillation. - Mild periodic limb movements of sleep occurred during the study. Associated arousals were significant.  DIAGNOSIS - Obstructive Sleep Apnea (327.23 [G47.33 ICD-10])  RECOMMENDATIONS - Very mild obstructive sleep apnea occurring primarily in supine position. - Recommend avoiding sleeping supine.  - Avoid alcohol, sedatives and other CNS depressants that may worsen sleep apnea and disrupt normal sleep architecture. - Sleep hygiene should be reviewed to assess factors that  may improve sleep quality. - Weight management and regular exercise should be initiated or continued if appropriate. - Consider referral to ENT for evaluation of possible surgical causes of snoring.   11/2016 echo Study Conclusions  - Left ventricle: The cavity size was normal. Wall thickness was   normal. Systolic function was normal. The estimated ejection   fraction was in the range of 60% to 65%. Left ventricular   diastolic function parameters were normal. - Left atrium: The atrium was mildly to moderately dilated.   Assessment and Plan   1. PAF - overall symptoms controlled, continue current meds - continue anticoagulation - EKG today shows NSR  2. HTN - bp is normal range today, continue to monitor.   3. CAD - history of nonobstructive CAD - no recent symptoms - continue risk factor modification.       Arnoldo Lenis, M.D.

## 2017-08-10 NOTE — Patient Instructions (Signed)

## 2017-08-17 ENCOUNTER — Encounter: Payer: Self-pay | Admitting: Cardiology

## 2017-08-27 DIAGNOSIS — Z6841 Body Mass Index (BMI) 40.0 and over, adult: Secondary | ICD-10-CM | POA: Diagnosis not present

## 2017-08-27 DIAGNOSIS — I4891 Unspecified atrial fibrillation: Secondary | ICD-10-CM | POA: Diagnosis not present

## 2017-08-27 DIAGNOSIS — Z87891 Personal history of nicotine dependence: Secondary | ICD-10-CM | POA: Diagnosis not present

## 2017-08-27 DIAGNOSIS — J069 Acute upper respiratory infection, unspecified: Secondary | ICD-10-CM | POA: Diagnosis not present

## 2017-08-27 DIAGNOSIS — Z299 Encounter for prophylactic measures, unspecified: Secondary | ICD-10-CM | POA: Diagnosis not present

## 2017-10-11 ENCOUNTER — Encounter (HOSPITAL_COMMUNITY): Payer: Self-pay | Admitting: Emergency Medicine

## 2017-10-11 ENCOUNTER — Emergency Department (HOSPITAL_COMMUNITY)
Admission: EM | Admit: 2017-10-11 | Discharge: 2017-10-11 | Disposition: A | Discharge status: Home / Self Care | Payer: Medicare Other | Attending: Emergency Medicine | Admitting: Emergency Medicine

## 2017-10-11 ENCOUNTER — Emergency Department (HOSPITAL_COMMUNITY): Payer: Medicare Other

## 2017-10-11 ENCOUNTER — Other Ambulatory Visit: Payer: Self-pay

## 2017-10-11 DIAGNOSIS — Z7901 Long term (current) use of anticoagulants: Secondary | ICD-10-CM | POA: Insufficient documentation

## 2017-10-11 DIAGNOSIS — Z79899 Other long term (current) drug therapy: Secondary | ICD-10-CM | POA: Insufficient documentation

## 2017-10-11 DIAGNOSIS — Z87891 Personal history of nicotine dependence: Secondary | ICD-10-CM | POA: Insufficient documentation

## 2017-10-11 DIAGNOSIS — R600 Localized edema: Secondary | ICD-10-CM | POA: Insufficient documentation

## 2017-10-11 DIAGNOSIS — I4891 Unspecified atrial fibrillation: Secondary | ICD-10-CM | POA: Insufficient documentation

## 2017-10-11 DIAGNOSIS — R079 Chest pain, unspecified: Secondary | ICD-10-CM

## 2017-10-11 LAB — CBC
HCT: 45.1 % (ref 36.0–46.0)
Hemoglobin: 14.2 g/dL (ref 12.0–15.0)
MCH: 27.4 pg (ref 26.0–34.0)
MCHC: 31.5 g/dL (ref 30.0–36.0)
MCV: 87.1 fL (ref 78.0–100.0)
Platelets: 358 10*3/uL (ref 150–400)
RBC: 5.18 MIL/uL — ABNORMAL HIGH (ref 3.87–5.11)
RDW: 14.8 % (ref 11.5–15.5)
WBC: 11.2 10*3/uL — ABNORMAL HIGH (ref 4.0–10.5)

## 2017-10-11 LAB — BASIC METABOLIC PANEL
Anion gap: 8 (ref 5–15)
BUN: 9 mg/dL (ref 6–20)
CO2: 28 mmol/L (ref 22–32)
Calcium: 8.8 mg/dL — ABNORMAL LOW (ref 8.9–10.3)
Chloride: 104 mmol/L (ref 101–111)
Creatinine, Ser: 0.93 mg/dL (ref 0.44–1.00)
GFR calc Af Amer: >60 mL/min (ref >60)
GFR calc non Af Amer: 60 mL/min — ABNORMAL LOW (ref >60)
Glucose, Bld: 129 mg/dL — ABNORMAL HIGH (ref 65–99)
Potassium: 3.8 mmol/L (ref 3.5–5.1)
Sodium: 140 mmol/L (ref 135–145)

## 2017-10-11 LAB — I-STAT TROPONIN, ED: Troponin i, poc: 0.01 ng/mL (ref 0.00–0.08)

## 2017-10-11 LAB — BRAIN NATRIURETIC PEPTIDE: B Natriuretic Peptide: 92 pg/mL (ref 0.0–100.0)

## 2017-10-11 LAB — TROPONIN I: Troponin I: <0.03 ng/mL (ref <0.03)

## 2017-10-11 NOTE — ED Triage Notes (Signed)
Patient c/o non-radiating, left side chest pain that started yesterday. Per patient intermittent. Patient reports shortness of breath, weakness, and nausea. Per patient hx of A-fib in which she states "I feel like I have been in Afib all week." Swelling in left lower leg. Denies hx of CHF. Per patient swelling related to "knee issue."

## 2017-10-12 ENCOUNTER — Other Ambulatory Visit: Payer: Self-pay | Admitting: Cardiology

## 2017-10-14 ENCOUNTER — Encounter: Payer: Self-pay | Admitting: Cardiology

## 2017-10-14 ENCOUNTER — Other Ambulatory Visit: Payer: Self-pay

## 2017-10-14 ENCOUNTER — Ambulatory Visit (INDEPENDENT_AMBULATORY_CARE_PROVIDER_SITE_OTHER): Payer: Medicare Other | Admitting: Cardiology

## 2017-10-14 VITALS — BP 103/67 | HR 86 | Ht 65.0 in | Wt 273.0 lb

## 2017-10-14 DIAGNOSIS — I4891 Unspecified atrial fibrillation: Secondary | ICD-10-CM

## 2017-10-14 DIAGNOSIS — I2510 Atherosclerotic heart disease of native coronary artery without angina pectoris: Secondary | ICD-10-CM | POA: Diagnosis not present

## 2017-10-14 NOTE — Patient Instructions (Addendum)
Medication Instructions:  Continue all current medications.  Labwork: none  Testing/Procedures:   Follow-Up: Atrial Fib clinic.    Any Other Special Instructions Will Be Listed Below (If Applicable).  If you need a refill on your cardiac medications before your next appointment, please call your pharmacy.

## 2017-10-14 NOTE — Progress Notes (Signed)
Clinical Summary [name] is a 72 y.o.female seen today for follow up of the following medical problems.  1. PAF - diagnosed 12/2014 during hospital admission at [facility] - echo at that time per clinic notes 55-60%.  - CHADS2Vasc score 2, she was started on xarelto at that time. - seen in hospital 09/2016 with"flutters"in chest.Records are not available at this time.Can have some orthostatic symptoms - she reports when dilt 180 to 240 increased fluttering and SOBworsened  - followed in afib clinic., Continued rate control, from last clinic note if recurrence of afib could consider antiarrhythmics  -  - 10/11/17 ER visit - EKG showed afib with elevated rates.  - afib symptosm about 1 week ago. Sharp pains in chest. Has felt nauseous.  - SOB, palpitations, fatigued.        Past Medical History:  Diagnosis Date  . Arthritis    pain and oa  right knee and pain in rt leg and foot--also pain left knee-but right is worse  . Atrial fibrillation ([place])   . Back pain    lumbar bulging disk  . Contact dermatitis    underside of left leg--always in the same spots--comes and goes  . Diverticulosis   . GERD (gastroesophageal reflux disease)    diet control  . Hemorrhoids      Allergies  Allergen Reactions  . Codeine Shortness Of Breath and Itching  . Meloxicam Other (See Comments)    Stomach iritation Stomach iritation     Current Outpatient Medications  Medication Sig Dispense Refill  . citalopram (CELEXA) 20 MG tablet Take 1 tablet by mouth daily.    Marland Kitchen diltiazem (CARDIZEM CD) 240 MG 24 hr capsule TAKE 1 CAPSULE BY MOUTH EVERY DAY 90 capsule 1  . fluticasone (FLONASE) 50 MCG/ACT nasal spray Place 1-2 sprays into both nostrils daily as needed for allergies.    . furosemide (LASIX) 20 MG tablet TAKE 1 TABLET BY MOUTH EVERY DAY 30 tablet 3  . rivaroxaban (XARELTO) 20 MG TABS tablet Take 20 mg by mouth daily with supper.     No current  facility-administered medications for this visit.      Past Surgical History:  Procedure Laterality Date  . CATARACT EXTRACTION W/PHACO Left 02/22/2015   Procedure: CATARACT EXTRACTION PHACO AND INTRAOCULAR LENS PLACEMENT LEFT EYE CDE=5.41;  Surgeon: Tonny Brooke Falkner, MD;  Location: [facility];  Service: Ophthalmology;  Laterality: Left;  . CATARACT EXTRACTION W/PHACO Right 03/26/2015   Procedure: CATARACT EXTRACTION PHACO AND INTRAOCULAR LENS PLACEMENT (IOC);  Surgeon: Tonny Brooke Schleifer, MD;  Location: [facility];  Service: Ophthalmology;  Laterality: Right;  CDE:5.01  . CHOLECYSTECTOMY    . surgery for broken right elbow Right   . TOTAL KNEE ARTHROPLASTY  11/03/2011   Procedure: TOTAL KNEE ARTHROPLASTY;  Surgeon: Brooke Alf, MD;  Location: [facility];  Service: Orthopedics;  Laterality: Right;  . TUBAL LIGATION       Allergies  Allergen Reactions  . Codeine Shortness Of Breath and Itching  . Meloxicam Other (See Comments)    Stomach iritation Stomach iritation      Family History  Problem Relation Age of Onset  . Diabetes Mother   . Stroke Mother   . Stroke Father      Social History [name] reports that she quit smoking about 21 years ago. She started smoking about 60 years ago. She has a 38.00 pack-year smoking history. She has never used smokeless tobacco. [name] reports that she  does not drink alcohol.   Review of Systems CONSTITUTIONAL: +fatigue HEENT: Eyes: No visual loss, blurred vision, double vision or yellow sclerae.No hearing loss, sneezing, congestion, runny nose or sore throat.  SKIN: No rash or itching.  CARDIOVASCULAR: per hpi RESPIRATORY: per hpi  GASTROINTESTINAL: No anorexia, nausea, vomiting or diarrhea. No abdominal pain or blood.  GENITOURINARY: No burning on urination, no polyuria NEUROLOGICAL: No headache, dizziness, syncope, paralysis, ataxia, numbness or tingling in the extremities. No change in bowel or bladder control.  MUSCULOSKELETAL: No muscle,  back pain, joint pain or stiffness.  LYMPHATICS: No enlarged nodes. No history of splenectomy.  PSYCHIATRIC: No history of depression or anxiety.  ENDOCRINOLOGIC: No reports of sweating, cold or heat intolerance. No polyuria or polydipsia.  Marland Kitchen   Physical Examination Vitals:   10/14/17 1053  BP: 103/67  Pulse: 86  SpO2: 95%   Vitals:   10/14/17 1053  Weight: 273 lb (123.8 kg)  Height: 5\' 5"  (1.651 m)    Gen: resting comfortably, no acute distress HEENT: no scleral icterus, pupils equal round and reactive, no palptable cervical adenopathy,  CV: irreg, no m/r/g, no jvd Resp: Clear to auscultation bilaterally GI: abdomen is soft, non-tender, non-distended, normal bowel sounds, no hepatosplenomegaly MSK: extremities are warm, no edema.  Skin: warm, no rash Neuro:  no focal deficits Psych: appropriate affect   Diagnostic Studies 12/2016 sleep study IMPRESSIONS - Minimal obstructive sleep apnea occurred during this study (AHI = 5.5/h) occurring primarily during supine sleep. - Mild central sleep apnea occurred during this study (CAI = 5.5/h). - The patient had minimal or no oxygen desaturation during the study (Min O2 = 100.00%) - The patient snored with Loud snoring volume. - EKG findings include Atrial Fibrillation. - Mild periodic limb movements of sleep occurred during the study. Associated arousals were significant.  DIAGNOSIS - Obstructive Sleep Apnea (327.23 [G47.33 ICD-10])  RECOMMENDATIONS - Very mild obstructive sleep apnea occurring primarily in supine position. - Recommend avoiding sleeping supine.  - Avoid alcohol, sedatives and other CNS depressants that may worsen sleep apnea and disrupt normal sleep architecture. - Sleep hygiene should be reviewed to assess factors that may improve sleep quality. - Weight management and regular exercise should be initiated or continued if appropriate. - Consider referral to ENT for evaluation of possible surgical causes of  snoring.  11/2016 echo Study Conclusions  - Left ventricle: The cavity size was normal. Wall thickness was normal. Systolic function was normal. The estimated ejection fraction was in the range of 60% to 65%. Left ventricular diastolic function parameters were normal. - Left atrium: The atrium was mildly to moderately dilated.      Assessment and Plan  1. PAF - back in afib based on recent ER EKG and symptoms, EKG today shows she remains in afib - she does not tolerate even rate controlled afib well. Rate control also limited due to soft bp's - plan for cardioversion, we discussed having her to go ER today for it but she prefers to arrange an outpatient procedure in a few days. She has not missed any doses of her anticoag - told if symptoms is significantly worsen to present to ER for cardioversion.        Arnoldo Lenis, M.D.

## 2017-10-15 ENCOUNTER — Encounter: Payer: Self-pay | Admitting: *Deleted

## 2017-10-15 NOTE — Patient Instructions (Signed)
Brooke Luna  10/15/2017     @PREFPERIOPPHARMACY @   Your procedure is scheduled on  10/19/2017 .  Report to [facility] at  930   A.M.  Call this number if you have problems the morning of surgery:  [phone]   Remember:  Do not eat or drink after midnight.  You may drink clear liquids until  12 midnight 10/18/2017  .  Clear liquids allowed are:                    Water, Juice (non-citric and without pulp), Carbonated beverages, Clear Tea, Black Coffee only, Plain Jell-O only, Gatorade and Plain Popsicles only     Take these medicines the morning of surgery with A SIP OF WATER  celexa    Do not wear jewelry, make-up or nail polish.  Do not wear lotions, powders, or perfumes, or deodorant.  Do not shave 48 hours prior to surgery.  Men may shave face and neck.  Do not bring valuables to the hospital.  [facility] is not responsible for any belongings or valuables.  Contacts, dentures or bridgework may not be worn into surgery.  Leave your suitcase in the car.  After surgery it may be brought to your room.  For patients admitted to the hospital, discharge time will be determined by your treatment team.  Patients discharged the day of surgery will not be allowed to drive home.   Name and phone number of your driver:   family Special instructions:  None  Please read over the following fact sheets that you were given. Anesthesia Post-op Instructions and Care and Recovery After Surgery       Electrical Cardioversion Electrical cardioversion is the delivery of a jolt of electricity to restore a normal rhythm to the heart. A rhythm that is too fast or is not regular keeps the heart from pumping well. In this procedure, sticky patches or metal paddles are placed on the chest to deliver electricity to the heart from a device. This procedure may be done in an emergency if:  There is low or no blood pressure as a result of the heart rhythm.  Normal rhythm  must be restored as fast as possible to protect the brain and heart from further damage.  It may save a life.  This procedure may also be done for irregular or fast heart rhythms that are not immediately life-threatening. Tell a health care provider about:  Any allergies you have.  All medicines you are taking, including vitamins, herbs, eye drops, creams, and over-the-counter medicines.  Any problems you or family members have had with anesthetic medicines.  Any blood disorders you have.  Any surgeries you have had.  Any medical conditions you have.  Whether you are pregnant or may be pregnant. What are the risks? Generally, this is a safe procedure. However, problems may occur, including:  Allergic reactions to medicines.  A blood clot that breaks free and travels to other parts of your body.  The possible return of an abnormal heart rhythm within hours or days after the procedure.  Your heart stopping (cardiac arrest). This is rare.  What happens before the procedure? Medicines  Your health care provider may have you start taking: ? Blood-thinning medicines (anticoagulants) so your blood does not clot as easily. ? Medicines may be given to help stabilize your heart rate and rhythm.  Ask your health care provider about changing  or stopping your regular medicines. This is especially important if you are taking diabetes medicines or blood thinners. General instructions  Plan to have someone take you home from the hospital or clinic.  If you will be going home right after the procedure, plan to have someone with you for 24 hours.  Follow instructions from your health care provider about eating or drinking restrictions. What happens during the procedure?  To lower your risk of infection: ? Your health care team will wash or sanitize their hands. ? Your skin will be washed with soap.  An IV tube will be inserted into one of your veins.  You will be given a medicine  to help you relax (sedative).  Sticky patches (electrodes) or metal paddles may be placed on your chest.  An electrical shock will be delivered. The procedure may vary among health care providers and hospitals. What happens after the procedure?  Your blood pressure, heart rate, breathing rate, and blood oxygen level will be monitored until the medicines you were given have worn off.  Do not drive for 24 hours if you were given a sedative.  Your heart rhythm will be watched to make sure it does not change. This information is not intended to replace advice given to you by your health care provider. Make sure you discuss any questions you have with your health care provider. Document Released: 04/04/2002 Document Revised: 12/12/2015 Document Reviewed: 10/19/2015 Elsevier Interactive Patient Education  2017 [employer].  Electrical Cardioversion, Care After This sheet gives you information about how to care for yourself after your procedure. Your health care provider may also give you more specific instructions. If you have problems or questions, contact your health care provider. What can I expect after the procedure? After the procedure, it is common to have:  Some redness on the skin where the shocks were given.  Follow these instructions at home:  Do not drive for 24 hours if you were given a medicine to help you relax (sedative).  Take over-the-counter and prescription medicines only as told by your health care provider.  Ask your health care provider how to check your pulse. Check it often.  Rest for 48 hours after the procedure or as told by your health care provider.  Avoid or limit your caffeine use as told by your health care provider. Contact a health care provider if:  You feel like your heart is beating too quickly or your pulse is not regular.  You have a serious muscle cramp that does not go away. Get help right away if:  You have discomfort in your  chest.  You are dizzy or you feel faint.  You have trouble breathing or you are short of breath.  Your speech is slurred.  You have trouble moving an arm or leg on one side of your body.  Your fingers or toes turn cold or blue. This information is not intended to replace advice given to you by your health care provider. Make sure you discuss any questions you have with your health care provider. Document Released: 02/02/2013 Document Revised: 11/16/2015 Document Reviewed: 10/19/2015 Elsevier Interactive Patient Education  2018 [place] Anesthesia is a term that refers to techniques, procedures, and medicines that help a person stay safe and comfortable during a medical procedure. Monitored anesthesia care, or sedation, is one type of anesthesia. Your anesthesia specialist may recommend sedation if you will be having a procedure that does not require you to  be unconscious, such as:  Cataract surgery.  A dental procedure.  A biopsy.  A colonoscopy.  During the procedure, you may receive a medicine to help you relax (sedative). There are three levels of sedation:  Mild sedation. At this level, you may feel awake and relaxed. You will be able to follow directions.  Moderate sedation. At this level, you will be sleepy. You may not remember the procedure.  Deep sedation. At this level, you will be asleep. You will not remember the procedure.  The more medicine you are given, the deeper your level of sedation will be. Depending on how you respond to the procedure, the anesthesia specialist may change your level of sedation or the type of anesthesia to fit your needs. An anesthesia specialist will monitor you closely during the procedure. Let your health care provider know about:  Any allergies you have.  All medicines you are taking, including vitamins, herbs, eye drops, creams, and over-the-counter medicines.  Any use of steroids (by mouth or as a  cream).  Any problems you or family members have had with sedatives and anesthetic medicines.  Any blood disorders you have.  Any surgeries you have had.  Any medical conditions you have, such as sleep apnea.  Whether you are pregnant or may be pregnant.  Any use of cigarettes, alcohol, or street drugs. What are the risks? Generally, this is a safe procedure. However, problems may occur, including:  Getting too much medicine (oversedation).  Nausea.  Allergic reaction to medicines.  Trouble breathing. If this happens, a breathing tube may be used to help with breathing. It will be removed when you are awake and breathing on your own.  Heart trouble.  Lung trouble.  Before the procedure Staying hydrated Follow instructions from your health care provider about hydration, which may include:  Up to 2 hours before the procedure - you may continue to drink clear liquids, such as water, clear fruit juice, black coffee, and plain tea.  Eating and drinking restrictions Follow instructions from your health care provider about eating and drinking, which may include:  8 hours before the procedure - stop eating heavy meals or foods such as meat, fried foods, or fatty foods.  6 hours before the procedure - stop eating light meals or foods, such as toast or cereal.  6 hours before the procedure - stop drinking milk or drinks that contain milk.  2 hours before the procedure - stop drinking clear liquids.  Medicines Ask your health care provider about:  Changing or stopping your regular medicines. This is especially important if you are taking diabetes medicines or blood thinners.  Taking medicines such as aspirin and ibuprofen. These medicines can thin your blood. Do not take these medicines before your procedure if your health care provider instructs you not to.  Tests and exams  You will have a physical exam.  You may have blood tests done to show: ? How well your kidneys  and liver are working. ? How well your blood can clot.  General instructions  Plan to have someone take you home from the hospital or clinic.  If you will be going home right after the procedure, plan to have someone with you for 24 hours.  What happens during the procedure?  Your blood pressure, heart rate, breathing, level of pain and overall condition will be monitored.  An IV tube will be inserted into one of your veins.  Your anesthesia specialist will give you medicines as needed  to keep you comfortable during the procedure. This may mean changing the level of sedation.  The procedure will be performed. After the procedure  Your blood pressure, heart rate, breathing rate, and blood oxygen level will be monitored until the medicines you were given have worn off.  Do not drive for 24 hours if you received a sedative.  You may: ? Feel sleepy, clumsy, or nauseous. ? Feel forgetful about what happened after the procedure. ? Have a sore throat if you had a breathing tube during the procedure. ? Vomit. This information is not intended to replace advice given to you by your health care provider. Make sure you discuss any questions you have with your health care provider. Document Released: 01/08/2005 Document Revised: 09/21/2015 Document Reviewed: 08/05/2015 Elsevier Interactive Patient Education  2018 [place], Care After These instructions provide you with information about caring for yourself after your procedure. Your health care provider may also give you more specific instructions. Your treatment has been planned according to current medical practices, but problems sometimes occur. Call your health care provider if you have any problems or questions after your procedure. What can I expect after the procedure? After your procedure, it is common to:  Feel sleepy for several hours.  Feel clumsy and have poor balance for several hours.  Feel  forgetful about what happened after the procedure.  Have poor judgment for several hours.  Feel nauseous or vomit.  Have a sore throat if you had a breathing tube during the procedure.  Follow these instructions at home: For at least 24 hours after the procedure:   Do not: ? Participate in activities in which you could fall or become injured. ? Drive. ? Use heavy machinery. ? Drink alcohol. ? Take sleeping pills or medicines that cause drowsiness. ? Make important decisions or sign legal documents. ? Take care of children on your own.  Rest. Eating and drinking  Follow the diet that is recommended by your health care provider.  If you vomit, drink water, juice, or soup when you can drink without vomiting.  Make sure you have little or no nausea before eating solid foods. General instructions  Have a responsible adult stay with you until you are awake and alert.  Take over-the-counter and prescription medicines only as told by your health care provider.  If you smoke, do not smoke without supervision.  Keep all follow-up visits as told by your health care provider. This is important. Contact a health care provider if:  You keep feeling nauseous or you keep vomiting.  You feel light-headed.  You develop a rash.  You have a fever. Get help right away if:  You have trouble breathing. This information is not intended to replace advice given to you by your health care provider. Make sure you discuss any questions you have with your health care provider. Document Released: 08/05/2015 Document Revised: 12/05/2015 Document Reviewed: 08/05/2015 Elsevier Interactive Patient Education  [employer].

## 2017-10-16 ENCOUNTER — Encounter (HOSPITAL_COMMUNITY): Payer: Self-pay

## 2017-10-16 ENCOUNTER — Encounter (HOSPITAL_COMMUNITY)
Admission: RE | Admit: 2017-10-16 | Discharge: 2017-10-16 | Disposition: A | Discharge status: Home / Self Care | Payer: Medicare Other | Source: Ambulatory Visit | Attending: Cardiology | Admitting: Cardiology

## 2017-10-16 ENCOUNTER — Telehealth: Payer: Self-pay | Admitting: Cardiology

## 2017-10-16 ENCOUNTER — Other Ambulatory Visit: Payer: Self-pay | Admitting: Cardiology

## 2017-10-16 ENCOUNTER — Encounter: Payer: Self-pay | Admitting: Cardiology

## 2017-10-16 NOTE — Telephone Encounter (Signed)
Pre-cert Verification for the following procedure   CARDIOVERSION scheduled for 10/19/2017 at [facility]

## 2017-10-16 NOTE — ED Provider Notes (Signed)
[facility] EMERGENCY DEPARTMENT Provider Note   CSN: [id number] Arrival date & time: 10/11/17  1026     History   Chief Complaint Chief Complaint  Patient presents with  . Chest Pain    HPI Brooke Luna is a 72 y.o. female.  HPI   89yF with several days history of generally not feeling well. Feels weak. Nausea. No vomiting. Intermittent sharp L sided CP. Lasts seconds like someone is poking her with a sharp object. Increased swelling in legs. No cough. No orthopnea.   Past Medical History:  Diagnosis Date  . Arthritis    pain and oa  right knee and pain in rt leg and foot--also pain left knee-but right is worse  . Atrial fibrillation ([place])   . Back pain    lumbar bulging disk  . Contact dermatitis    underside of left leg--always in the same spots--comes and goes  . Diverticulosis   . GERD (gastroesophageal reflux disease)    diet control  . Hemorrhoids     Patient Active Problem List   Diagnosis Date Noted  . Atrial fibrillation ([place])   . Varicose veins of lower extremities with other complications 26/20/3559  . Chronic venous insufficiency 12/17/2012  . Postop Hyponatremia 11/05/2011  . OA (osteoarthritis) of knee 11/03/2011    Past Surgical History:  Procedure Laterality Date  . CATARACT EXTRACTION W/PHACO Left 02/22/2015   Procedure: CATARACT EXTRACTION PHACO AND INTRAOCULAR LENS PLACEMENT LEFT EYE CDE=5.41;  Surgeon: Tonny Branch, MD;  Location: [facility] ORS;  Service: Ophthalmology;  Laterality: Left;  . CATARACT EXTRACTION W/PHACO Right 03/26/2015   Procedure: CATARACT EXTRACTION PHACO AND INTRAOCULAR LENS PLACEMENT (IOC);  Surgeon: Tonny Branch, MD;  Location: [facility] ORS;  Service: Ophthalmology;  Laterality: Right;  CDE:5.01  . CHOLECYSTECTOMY    . surgery for broken right elbow Right   . TOTAL KNEE ARTHROPLASTY  11/03/2011   Procedure: TOTAL KNEE ARTHROPLASTY;  Surgeon: Gearlean Alf, MD;  Location: [facility] ORS;  Service: Orthopedics;  Laterality: Right;    . TUBAL LIGATION       OB History   None      Home Medications    Prior to Admission medications   Medication Sig Start Date End Date Taking? Authorizing Provider  citalopram (CELEXA) 20 MG tablet Take 1 tablet by mouth daily. 10/28/12  Yes [provider]  fluticasone (FLONASE) 50 MCG/ACT nasal spray Place 1-2 sprays into both nostrils daily as needed for allergies. 10/09/17  Yes [provider]  furosemide (LASIX) 20 MG tablet TAKE 1 TABLET BY MOUTH EVERY DAY 08/06/17  Yes Branch, Alphonse Guild, MD  rivaroxaban (XARELTO) 20 MG TABS tablet Take 20 mg by mouth daily with supper.   Yes [provider]  diltiazem (CARDIZEM CD) 240 MG 24 hr capsule TAKE 1 CAPSULE BY MOUTH EVERY DAY 10/12/17   Arnoldo Lenis, MD    Family History Family History  Problem Relation Age of Onset  . Diabetes Mother   . Stroke Mother   . Stroke Father     Social History Social History   Tobacco Use  . Smoking status: Former Smoker    Packs/day: 1.00    Years: 38.00    Pack years: 38.00    Start date: 06/25/1957    Last attempt to quit: 04/28/1996    Years since quitting: 21.4  . Smokeless tobacco: Never Used  . Tobacco comment: quit smoking 1998  Substance Use Topics  . Alcohol use: Never  Frequency: Never  . Drug use: No     Allergies   Codeine and Meloxicam   Review of Systems Review of Systems  All systems reviewed and negative, other than as noted in HPI.  Physical Exam Updated Vital Signs BP 112/60   Pulse 74   Temp 97.6 F (36.4 C) (Oral)   Resp 18   Ht 5\' 5"  (1.651 m)   Wt 120.2 kg (265 lb)   SpO2 94%   BMI 44.10 kg/m   Physical Exam  Constitutional: She appears well-developed and well-nourished. No distress.  HENT:  Head: Normocephalic and atraumatic.  Eyes: Conjunctivae are normal. Right eye exhibits no discharge. Left eye exhibits no discharge.  Neck: Neck supple.  Cardiovascular: Normal rate and normal heart sounds. Exam reveals no  gallop and no friction rub.  No murmur heard. irreg irreg  Pulmonary/Chest: Effort normal and breath sounds normal. No respiratory distress.  Abdominal: Soft. She exhibits no distension. There is no tenderness.  Musculoskeletal: She exhibits edema. She exhibits no tenderness.  Neurological: She is alert.  Skin: Skin is warm and dry.  Psychiatric: She has a normal mood and affect. Her behavior is normal. Thought content normal.  Nursing note and vitals reviewed.    ED Treatments / Results  Labs (all labs ordered are listed, but only abnormal results are displayed) Labs Reviewed  BASIC METABOLIC PANEL - Abnormal; Notable for the following components:      Result Value   Glucose, Bld 129 (*)    Calcium 8.8 (*)    GFR calc non Af Amer 60 (*)    All other components within normal limits  CBC - Abnormal; Notable for the following components:   WBC 11.2 (*)    RBC 5.18 (*)    All other components within normal limits  TROPONIN I  BRAIN NATRIURETIC PEPTIDE  I-STAT TROPONIN, ED    EKG EKG Interpretation  Date/Time:  Sunday October 11 2017 10:39:00 EDT Ventricular Rate:  111 PR Interval:    QRS Duration: 94 QT Interval:  363 QTC Calculation: 494 R Axis:   55 Text Interpretation:  Atrial fibrillation Low voltage, precordial leads Borderline repolarization abnormality Borderline prolonged QT interval Confirmed by Jadden Yim ([id number]) on 10/11/2017 12:31:01 PM   Radiology No results found.  Procedures Procedures (including critical care time)  Medications Ordered in ED Medications - No data to display   Initial Impression / Assessment and Plan / ED Course  I have reviewed the triage vital signs and the nursing notes.  Pertinent labs & imaging results that were available during my care of the patient were reviewed by me and considered in my medical decision making (see chart for details).     72 yF whom is likely symptomatic with her afib. CP sounds atypical. She has been  in and out of afib while in the ED. Rate has been controlled. She doesn't appear ill. NO increased WOB. SHe has established cardiology care. I feel she is appropriate for DC at this time but this she needs cardiology FU this upcoming week.   Final Clinical Impressions(s) / ED Diagnoses   Final diagnoses:  Chest pain, unspecified type  Atrial fibrillation, unspecified type [facility])    ED Discharge Orders    None       Virgel Manifold, MD 10/16/17 [phone]

## 2017-10-19 ENCOUNTER — Ambulatory Visit (HOSPITAL_COMMUNITY)
Admission: RE | Admit: 2017-10-19 | Discharge: 2017-10-19 | Disposition: A | Discharge status: Home / Self Care | Payer: Medicare Other | Source: Ambulatory Visit | Attending: Cardiology | Admitting: Cardiology

## 2017-10-19 ENCOUNTER — Encounter (HOSPITAL_COMMUNITY)
Admission: RE | Disposition: A | Discharge status: Home / Self Care | Payer: Self-pay | Source: Ambulatory Visit | Attending: Cardiology

## 2017-10-19 DIAGNOSIS — Z833 Family history of diabetes mellitus: Secondary | ICD-10-CM | POA: Diagnosis not present

## 2017-10-19 DIAGNOSIS — Z888 Allergy status to other drugs, medicaments and biological substances status: Secondary | ICD-10-CM | POA: Insufficient documentation

## 2017-10-19 DIAGNOSIS — Z9842 Cataract extraction status, left eye: Secondary | ICD-10-CM | POA: Insufficient documentation

## 2017-10-19 DIAGNOSIS — Z79899 Other long term (current) drug therapy: Secondary | ICD-10-CM | POA: Diagnosis not present

## 2017-10-19 DIAGNOSIS — Z9049 Acquired absence of other specified parts of digestive tract: Secondary | ICD-10-CM | POA: Insufficient documentation

## 2017-10-19 DIAGNOSIS — Z96651 Presence of right artificial knee joint: Secondary | ICD-10-CM | POA: Insufficient documentation

## 2017-10-19 DIAGNOSIS — I4891 Unspecified atrial fibrillation: Secondary | ICD-10-CM | POA: Insufficient documentation

## 2017-10-19 DIAGNOSIS — Z823 Family history of stroke: Secondary | ICD-10-CM | POA: Insufficient documentation

## 2017-10-19 DIAGNOSIS — M1990 Unspecified osteoarthritis, unspecified site: Secondary | ICD-10-CM | POA: Insufficient documentation

## 2017-10-19 DIAGNOSIS — K219 Gastro-esophageal reflux disease without esophagitis: Secondary | ICD-10-CM | POA: Diagnosis not present

## 2017-10-19 DIAGNOSIS — M549 Dorsalgia, unspecified: Secondary | ICD-10-CM | POA: Diagnosis not present

## 2017-10-19 DIAGNOSIS — Z885 Allergy status to narcotic agent status: Secondary | ICD-10-CM | POA: Insufficient documentation

## 2017-10-19 DIAGNOSIS — Z538 Procedure and treatment not carried out for other reasons: Secondary | ICD-10-CM | POA: Diagnosis not present

## 2017-10-19 DIAGNOSIS — Z9841 Cataract extraction status, right eye: Secondary | ICD-10-CM | POA: Insufficient documentation

## 2017-10-19 SURGERY — CARDIOVERSION
Anesthesia: Monitor Anesthesia Care

## 2017-10-19 NOTE — Progress Notes (Signed)
Pt arrived to Short Stay for [place]. 12 lead EKG showed the pt to be in a NSR. Dr. Harl Bowie notified and reviewed EKG to confirm. Pt cancelled and will follow up with the Afib clinic.

## 2017-10-28 ENCOUNTER — Emergency Department (HOSPITAL_COMMUNITY)
Admission: EM | Admit: 2017-10-28 | Discharge: 2017-10-28 | Disposition: A | Discharge status: Home / Self Care | Payer: Medicare Other | Attending: Emergency Medicine | Admitting: Emergency Medicine

## 2017-10-28 ENCOUNTER — Other Ambulatory Visit: Payer: Self-pay

## 2017-10-28 ENCOUNTER — Emergency Department (HOSPITAL_COMMUNITY): Payer: Medicare Other

## 2017-10-28 ENCOUNTER — Encounter (HOSPITAL_COMMUNITY): Payer: Self-pay | Admitting: Emergency Medicine

## 2017-10-28 DIAGNOSIS — Y998 Other external cause status: Secondary | ICD-10-CM | POA: Insufficient documentation

## 2017-10-28 DIAGNOSIS — S79921A Unspecified injury of right thigh, initial encounter: Secondary | ICD-10-CM | POA: Diagnosis not present

## 2017-10-28 DIAGNOSIS — S5001XA Contusion of right elbow, initial encounter: Secondary | ICD-10-CM | POA: Diagnosis not present

## 2017-10-28 DIAGNOSIS — W109XXA Fall (on) (from) unspecified stairs and steps, initial encounter: Secondary | ICD-10-CM | POA: Insufficient documentation

## 2017-10-28 DIAGNOSIS — S76121A Laceration of right quadriceps muscle, fascia and tendon, initial encounter: Secondary | ICD-10-CM | POA: Diagnosis not present

## 2017-10-28 DIAGNOSIS — Y939 Activity, unspecified: Secondary | ICD-10-CM | POA: Insufficient documentation

## 2017-10-28 DIAGNOSIS — Z87891 Personal history of nicotine dependence: Secondary | ICD-10-CM | POA: Diagnosis not present

## 2017-10-28 DIAGNOSIS — Y929 Unspecified place or not applicable: Secondary | ICD-10-CM | POA: Diagnosis not present

## 2017-10-28 DIAGNOSIS — M25421 Effusion, right elbow: Secondary | ICD-10-CM | POA: Diagnosis not present

## 2017-10-28 DIAGNOSIS — M79604 Pain in right leg: Secondary | ICD-10-CM | POA: Diagnosis not present

## 2017-10-28 DIAGNOSIS — Z79899 Other long term (current) drug therapy: Secondary | ICD-10-CM | POA: Insufficient documentation

## 2017-10-28 DIAGNOSIS — M25511 Pain in right shoulder: Secondary | ICD-10-CM | POA: Diagnosis not present

## 2017-10-28 DIAGNOSIS — Z7901 Long term (current) use of anticoagulants: Secondary | ICD-10-CM | POA: Insufficient documentation

## 2017-10-28 DIAGNOSIS — I480 Paroxysmal atrial fibrillation: Secondary | ICD-10-CM | POA: Insufficient documentation

## 2017-10-28 DIAGNOSIS — S52124A Nondisplaced fracture of head of right radius, initial encounter for closed fracture: Secondary | ICD-10-CM | POA: Diagnosis not present

## 2017-10-28 DIAGNOSIS — S76111A Strain of right quadriceps muscle, fascia and tendon, initial encounter: Secondary | ICD-10-CM | POA: Diagnosis not present

## 2017-10-28 DIAGNOSIS — S51011A Laceration without foreign body of right elbow, initial encounter: Secondary | ICD-10-CM | POA: Diagnosis not present

## 2017-10-28 DIAGNOSIS — S6991XA Unspecified injury of right wrist, hand and finger(s), initial encounter: Secondary | ICD-10-CM | POA: Diagnosis present

## 2017-10-28 DIAGNOSIS — W19XXXA Unspecified fall, initial encounter: Secondary | ICD-10-CM

## 2017-10-28 DIAGNOSIS — S52121A Displaced fracture of head of right radius, initial encounter for closed fracture: Secondary | ICD-10-CM | POA: Diagnosis not present

## 2017-10-28 DIAGNOSIS — T148XXA Other injury of unspecified body region, initial encounter: Secondary | ICD-10-CM

## 2017-10-28 MED ORDER — HYDROCODONE-ACETAMINOPHEN 5-325 MG PO TABS: 1.0000 | ORAL_TABLET | ORAL | 0 refills | Status: DC | PRN

## 2017-10-28 MED ORDER — HYDROCODONE-ACETAMINOPHEN 5-325 MG PO TABS
1.0000 | ORAL_TABLET | Freq: Once | ORAL | Status: AC
  Administered 2017-10-28: 1 via ORAL
  Filled 2017-10-28: qty 1

## 2017-10-28 NOTE — Discharge Instructions (Addendum)
As discussed you have a radial head fracture which is non displaced, although if you injure this bone further, it may become a surgical injury as discussed.  This is the risk of choosing not to have a splint placed on your elbow.  Use ice and elevation as much as possible for pain relief in your right thigh.  You will need to see Dr. Wynelle Link for further evaluation of these muscle tears.  Use the walker for stability with walking. You may take the hydrocodone prescribed for pain relief.  This will make you drowsy - do not drive within 4 hours of taking this medication.

## 2017-10-28 NOTE — ED Triage Notes (Signed)
Pt states she fell last week on concrete from about a foot. Has bruising noted to R arm, state she also has R leg pain. Last night was sitting for extended amount of time and now has increased pain with ambulation. Increased swelling per pt. OTC medication with no relief.

## 2017-10-28 NOTE — ED Notes (Signed)
Pt ambulated in hallway with walker Steady gait noted. Pt denied dizziness, reported mild pain.

## 2017-10-28 NOTE — ED Provider Notes (Signed)
[facility] EMERGENCY DEPARTMENT Provider Note   CSN: [id number] Arrival date & time: 10/28/17  1227     History   Chief Complaint Chief Complaint  Patient presents with  . Leg Pain    HPI Brooke Luna is a 72 y.o. female with past medical history of arthritis, PAF on chronic xaralto and h/o right total knee replacement about 5 years ago (Dr. Wynelle Link) presenting with pain in the right leg and right arm.  She tripped on a step one week ago, landing on her right side and hit her right lower lateral thigh on directly on a water bottle. She reports persistent soreness of her right shoulder, has moderate bruising of her right elbow area (some pain) but has worsened pain and swelling along her lateral right lower thigh since yesterday.  She denies any new injury.  She has not been able to bear weight on her right leg since the pain worsened yesterday, prior to then, was fairly sedenatary since her fall but was able to weight bear. She denies pain in her lower leg or foot, denies numbness or tingling. Besides rest, she has used ice, aleve and an icy hot product without relief.  The history is provided by the patient.    Past Medical History:  Diagnosis Date  . Arthritis    pain and oa  right knee and pain in rt leg and foot--also pain left knee-but right is worse  . Atrial fibrillation ([place])   . Back pain    lumbar bulging disk  . Contact dermatitis    underside of left leg--always in the same spots--comes and goes  . Diverticulosis   . GERD (gastroesophageal reflux disease)    diet control  . Hemorrhoids     Patient Active Problem List   Diagnosis Date Noted  . Atrial fibrillation ([place])   . Varicose veins of lower extremities with other complications 66/44/0347  . Chronic venous insufficiency 12/17/2012  . Postop Hyponatremia 11/05/2011  . OA (osteoarthritis) of knee 11/03/2011    Past Surgical History:  Procedure Laterality Date  . CATARACT EXTRACTION W/PHACO  Left 02/22/2015   Procedure: CATARACT EXTRACTION PHACO AND INTRAOCULAR LENS PLACEMENT LEFT EYE CDE=5.41;  Surgeon: Tonny Branch, MD;  Location: [facility];  Service: Ophthalmology;  Laterality: Left;  . CATARACT EXTRACTION W/PHACO Right 03/26/2015   Procedure: CATARACT EXTRACTION PHACO AND INTRAOCULAR LENS PLACEMENT (IOC);  Surgeon: Tonny Branch, MD;  Location: [facility];  Service: Ophthalmology;  Laterality: Right;  CDE:5.01  . CHOLECYSTECTOMY    . surgery for broken right elbow Right   . TOTAL KNEE ARTHROPLASTY  11/03/2011   Procedure: TOTAL KNEE ARTHROPLASTY;  Surgeon: Gearlean Alf, MD;  Location: [facility];  Service: Orthopedics;  Laterality: Right;  . TUBAL LIGATION       OB History   None      Home Medications    Prior to Admission medications   Medication Sig Start Date End Date Taking? Authorizing Provider  citalopram (CELEXA) 20 MG tablet Take 1 tablet by mouth daily. 10/28/12   [provider]  diltiazem (CARDIZEM CD) 240 MG 24 hr capsule TAKE 1 CAPSULE BY MOUTH EVERY DAY 10/12/17   Arnoldo Lenis, MD  fluticasone (FLONASE) 50 MCG/ACT nasal spray Place 1-2 sprays into both nostrils daily as needed for allergies. 10/09/17   [provider]  furosemide (LASIX) 20 MG tablet TAKE 1 TABLET BY MOUTH EVERY DAY 08/06/17   Arnoldo Lenis, MD  HYDROcodone-acetaminophen (NORCO/VICODIN) 5-325 MG  tablet Take 1 tablet by mouth every 4 (four) hours as needed. 10/28/17   Evalee Jefferson, PA-C  rivaroxaban (XARELTO) 20 MG TABS tablet Take 20 mg by mouth daily with supper.    [provider]    Family History Family History  Problem Relation Age of Onset  . Diabetes Mother   . Stroke Mother   . Stroke Father     Social History Social History   Tobacco Use  . Smoking status: Former Smoker    Packs/day: 1.00    Years: 38.00    Pack years: 38.00    Start date: 06/25/1957    Last attempt to quit: 04/28/1996    Years since quitting: 21.5  . Smokeless tobacco: Never Used  .  Tobacco comment: quit smoking 1998  Substance Use Topics  . Alcohol use: Never    Frequency: Never  . Drug use: No     Allergies   Codeine and Meloxicam   Review of Systems Review of Systems  Constitutional: Negative for fever.  HENT: Negative.   Respiratory: Negative.  Negative for chest tightness and shortness of breath.   Cardiovascular: Negative.   Gastrointestinal: Negative.   Musculoskeletal: Positive for arthralgias. Negative for joint swelling and myalgias.  Skin: Positive for color change.  Neurological: Negative for weakness and numbness.     Physical Exam Updated Vital Signs BP (!) 117/48   Pulse 60   Temp 99.2 F (37.3 C)   Resp 17   Ht 5\' 5"  (1.651 m)   Wt 123.8 kg (273 lb)   SpO2 99%   BMI 45.43 kg/m   Physical Exam  Constitutional: She appears well-developed and well-nourished.  HENT:  Head: Atraumatic.  Neck: Normal range of motion.  Cardiovascular:  Pulses:      Radial pulses are 2+ on the right side, and 2+ on the left side.       Dorsalis pedis pulses are 2+ on the right side, and 2+ on the left side.  Pulses equal bilaterally  Musculoskeletal: She exhibits edema and tenderness.       Right elbow: She exhibits no swelling, no effusion and no deformity.       Legs: ttp right lateral lower thigh, edema without bruising.  Compartments are soft.  No ttp of the right knee joint or calf.  Large bruise noted right medial elbow and upper arm.  ttp anterior shoulder without edema.  Mild pain with elbow flexion at the superior humerus. No deformity noted.   Neurological: She is alert. She has normal strength. She displays normal reflexes. No sensory deficit.  Skin: Skin is warm and dry.  Psychiatric: She has a normal mood and affect.     ED Treatments / Results  Labs (all labs ordered are listed, but only abnormal results are displayed) Labs Reviewed - No data to display  EKG None  Radiology Dg Shoulder Right  Result Date:  10/28/2017 CLINICAL DATA:  Acute RIGHT shoulder pain following fall last week. Initial encounter. EXAM: RIGHT SHOULDER - 2+ VIEW COMPARISON:  None. FINDINGS: No acute fracture or dislocation. Degenerative changes at the glenohumeral joint noted. No acute bony abnormalities or focal bony lesions. IMPRESSION: No acute abnormality. Electronically Signed   By: Margarette Canada M.D.   On: 10/28/2017 17:06   Dg Elbow Complete Right  Result Date: 10/28/2017 CLINICAL DATA:  Right elbow bruising and pain after fall last week. EXAM: RIGHT ELBOW - COMPLETE 3+ VIEW COMPARISON:  None. FINDINGS: Elevation of the anterior  fat pad, consistent with small joint effusion. Probable nondisplaced fracture of the radial head. Joint spaces are preserved. Osteopenia. Soft tissues are unremarkable. IMPRESSION: 1. Small joint effusion with probable nondisplaced fracture of the radial head. Electronically Signed   By: Titus Dubin M.D.   On: 10/28/2017 17:02   Mr Frmur Right Wo Contrast  Result Date: 10/28/2017 CLINICAL DATA:  [place] 1 week ago and injured lateral right thigh. Persistent pain and swelling. EXAM: MRI OF THE RIGHT FEMUR WITHOUT CONTRAST TECHNIQUE: Multiplanar, multisequence MR imaging of the right femur was performed. No intravenous contrast was administered. COMPARISON:  Radiograph 10/28/2017 FINDINGS: Examination is somewhat limited by patient motion. Extensive edema like signal abnormality and fluid in the quadriceps compartment of the right hip and right thigh consistent with significant muscle tears. The vastus medialis tendon appears wavy and redundant and is likely torn from its attachment site near the intertrochanteric region of the hip. High-grade tearing of the vastus lateralis muscle and also the vastus intermedius muscle. There is a small intramuscular hematoma in the vastus intermedius. The rectus femoris muscle is spared. The right femur is intact. No femur fracture. The hamstring muscles and the adductor muscles  are intact. No hip fracture or hip joint effusion. Moderate fatty atrophy of the gluteal muscles. IMPRESSION: 1. Fairly extensive tearing of the quadriceps muscles. It appears at the vastus medialis is torn from its origin site on the right hip. Intramuscular hematoma noted in the vastus intermedius muscle. The rectus femoris muscle is spared. 2. No acute fracture is identified. Electronically Signed   By: Marijo Sanes M.D.   On: 10/28/2017 19:19   Dg Femur, Min 2 Views Right  Result Date: 10/28/2017 CLINICAL DATA:  Right leg pain after a fall. EXAM: RIGHT FEMUR 2 VIEWS COMPARISON:  No comparison studies available. FINDINGS: Two view exam of the right femur shows no evidence of fracture. No worrisome lytic or sclerotic osseous abnormality. The patient is status post knee replacement. IMPRESSION: 1. No evidence for right femur fracture. 2. Right knee replacement. Electronically Signed   By: Misty Stanley M.D.   On: 10/28/2017 13:08    Procedures Procedures (including critical care time)  Medications Ordered in ED Medications  HYDROcodone-acetaminophen (NORCO/VICODIN) 5-325 MG per tablet 1 tablet (1 tablet Oral Given 10/28/17 1625)     Initial Impression / Assessment and Plan / ED Course  I have reviewed the triage vital signs and the nursing notes.  Pertinent labs & imaging results that were available during my care of the patient were reviewed by me and considered in my medical decision making (see chart for details).     Imaging reviewed and discussed with pt.  Pt also seen by Dr. Dayna Barker during todays visit.  Sugar tong splint offered which pt declined, stating prior fracture of the right elbow 20 years ago which resulted in a frozen joint - she adamantly will not wear a splint or sling on the arm injury.  Discussed possible complications including further injury requiring surgical repair. Pt understands.  Discussed muscle tears and need for ortho f/u. Pt prefers her orthopedist Dr Wynelle Link. She  was able to ambulate in the dept using a walker, prescription provided.  Pt to call [facility] for f/u care.   Hydrocodone prescribed, caution discussed.  Ice, elevation.  Final Clinical Impressions(s) / ED Diagnoses   Final diagnoses:  Closed nondisplaced fracture of head of right radius, initial encounter  Muscle tear    ED Discharge Orders  Ordered    HYDROcodone-acetaminophen (NORCO/VICODIN) 5-325 MG tablet  Every 4 hours PRN     10/28/17 2037    Walker standard     10/28/17 2041       Evalee Jefferson, PA-C 10/28/17 2058    Mesner, Corene Cornea, MD 10/28/17 2316

## 2017-10-28 NOTE — ED Notes (Signed)
At this time pt is refusing splint and sling to the left arm, edp notified.

## 2017-11-13 DIAGNOSIS — S76911A Strain of unspecified muscles, fascia and tendons at thigh level, right thigh, initial encounter: Secondary | ICD-10-CM | POA: Diagnosis not present

## 2017-11-24 ENCOUNTER — Other Ambulatory Visit: Payer: Self-pay

## 2017-11-24 ENCOUNTER — Ambulatory Visit (HOSPITAL_COMMUNITY): Payer: Medicare Other | Attending: Orthopedic Surgery

## 2017-11-24 ENCOUNTER — Encounter (HOSPITAL_COMMUNITY): Payer: Self-pay

## 2017-11-24 DIAGNOSIS — M79651 Pain in right thigh: Secondary | ICD-10-CM | POA: Diagnosis not present

## 2017-11-24 DIAGNOSIS — M6281 Muscle weakness (generalized): Secondary | ICD-10-CM | POA: Diagnosis not present

## 2017-11-24 DIAGNOSIS — R2689 Other abnormalities of gait and mobility: Secondary | ICD-10-CM | POA: Diagnosis not present

## 2017-11-24 NOTE — Therapy (Signed)
[place], [place], [address] Phone: [phone]   Fax:  [phone]  Physical Therapy Evaluation  Patient Details  Name: Latonga Ponder MRN: [id number] Date of Birth: April 29, 1945 Referring Provider: Nicholes Stairs, MD   Encounter Date: 11/24/2017  PT End of Session - 11/24/17 1743    Visit Number  1    Number of Visits  13    Date for PT Re-Evaluation  01/05/18    Authorization Type  [employer] (secondary - [facility], tertiary - [place] [employer]) No auth required, 60 visit limit PT/OT/SLP - requires med reveiw after 30th visit    Authorization Time Period  11/24/17-01/08/18    Authorization - Visit Number  1    Authorization - Number of Visits  60    PT Start Time  1648    PT Stop Time  1735    PT Time Calculation (min)  47 min    Activity Tolerance  Patient tolerated treatment well    Behavior During Therapy  [facility] for tasks assessed/performed       Past Medical History:  Diagnosis Date  . Arthritis    pain and oa  right knee and pain in rt leg and foot--also pain left knee-but right is worse  . Atrial fibrillation ([place])   . Back pain    lumbar bulging disk  . Contact dermatitis    underside of left leg--always in the same spots--comes and goes  . Diverticulosis   . GERD (gastroesophageal reflux disease)    diet control  . Hemorrhoids     Past Surgical History:  Procedure Laterality Date  . CATARACT EXTRACTION W/PHACO Left 02/22/2015   Procedure: CATARACT EXTRACTION PHACO AND INTRAOCULAR LENS PLACEMENT LEFT EYE CDE=5.41;  Surgeon: Tonny Branch, MD;  Location: [facility];  Service: Ophthalmology;  Laterality: Left;  . CATARACT EXTRACTION W/PHACO Right 03/26/2015   Procedure: CATARACT EXTRACTION PHACO AND INTRAOCULAR LENS PLACEMENT (IOC);  Surgeon: Tonny Branch, MD;  Location: [facility];  Service: Ophthalmology;  Laterality: Right;  CDE:5.01  . CHOLECYSTECTOMY    . surgery for  broken right elbow Right   . TOTAL KNEE ARTHROPLASTY  11/03/2011   Procedure: TOTAL KNEE ARTHROPLASTY;  Surgeon: Gearlean Alf, MD;  Location: [facility];  Service: Orthopedics;  Laterality: Right;  . TUBAL LIGATION      There were no vitals filed for this visit.   Subjective Assessment - 11/24/17 1652    Subjective  Patient reports traumatic injury to her Rt thigh approximately 6 weeks ago when she fell off her steps leaving her house and landed on a concrete driveway. She reports she did not go to the hospital immediately after the fall as she suspected it was just a bad bruise. Pt reports she went about 2 weeks after the fall as her pain continued and she was unable to walk at that time due to severity. She reports x-rays of her right arm revealed a hairline fracture to her Rt radius and that she declined to have a splint and was given no WB restrictions to her knowledge. She also had imaging of her Rt hip and leg which did not reveal any acute fractures but showed the patient had torn her hip muscles. She was told she would require surgery to fix this. The patient reports Dr. Corine Shelter is the surgeon she spoke with and he told her she will likely heal with physical therapy and not require  surgery. She reports that her leg has been feeling much better over the last week and that she was given muscle relaxers which have really helped. She denies any other falls and reports prior to the fall she did not require an AD to mobilize but was limited by her Lt knee pain and LE swelling Lt>Rt.    Pertinent History  fall ~ 6 weeks ago resulting in Rt radius fracture and torn/strained muscles of Rt hip/thigh    How long can you sit comfortably?  sit as long as want (prefers legs to be elevated)    How long can you stand comfortably?  about 10 minutes max    How long can you walk comfortably?  about 10 minutes max    Patient Stated Goals  to be able to walk more normally again without pain    Pain Score  2      Pain Location  Leg    Pain Orientation  Right thigh, upper latearl across to knee onmedial side    Pain Descriptors / Indicators  Aching    Pain Type  Chronic pain    Pain Onset  More than a month ago    Pain Frequency  Constant    Aggravating Factors   walking, standing, having leg hang down and not being propped up    Pain Relieving Factors  muscle relaxers, ice, ibuprofen    Effect of Pain on Daily Activities  severe limitation but improving gradually         [facility] PT Assessment - 11/24/17 0001      Assessment   Medical Diagnosis  Strain of Rt Thigh Muscles    Referring Provider  Nicholes Stairs, MD    Onset Date/Surgical Date  -- approximately 6 weeks ago    Prior Therapy  ~ 5 years ago for TKA on Rt LE      Precautions   Precautions  None    Precaution Comments  Pt has a hairline fracture of Rt radius, but declined splinting by MD, was not informed of WB restrictions      Restrictions   Weight Bearing Restrictions  No      Balance Screen   Has the patient fallen in the past 6 months  Yes    How many times?  1 fall form stairs at home    Has the patient had a decrease in activity level because of a fear of falling?   Yes    Is the patient reluctant to leave their home because of a fear of falling?   Yes      [place] residence    Living Arrangements  Spouse/significant other    Available Help at Discharge  Family    Type of [place] to enter    [place] - 2 wheels;Cane - single point    Additional Comments  cannot stand long enough to cook      Prior Function   Level of Independence  Independent      Cognition   Overall Cognitive Status  Within Functional Limits for tasks assessed      Observation/Other Assessments   Focus on Therapeutic Outcomes (FOTO)   62% limited      Functional Tests   Functional tests  Single leg stance      Single Leg Stance   Comments  Rt LE = 0  seconds,  Lt LE = 0 seconds      Posture/Postural Control   Posture/Postural Control  Postural limitations    Postural Limitations  Rounded Shoulders;Forward head;Increased thoracic kyphosis      ROM / Strength   AROM / PROM / Strength  Strength      Strength   Strength Assessment Site  Hip;Knee;Ankle    Right Hip Flexion  4-/5 pain    Right Hip Extension  4/5    Right Hip ABduction  4-/5 pain    Left Hip Flexion  4/5    Left Hip Extension  4/5    Left Hip ABduction  4/5    Right/Left Knee  Right;Left    Right Knee Flexion  4/5    Right Knee Extension  4/5    Left Knee Flexion  4-/5    Left Knee Extension  4-/5    Right Ankle Dorsiflexion  4+/5    Left Ankle Dorsiflexion  4+/5      Palpation   Palpation comment  tenderness to palpation along Rt latearl hip into anterior thigh      Transfers   Five time sit to stand comments   18.03 without UE support      Ambulation/Gait   Ambulation/Gait  Yes    Ambulation/Gait Assistance  6: Modified independent (Device/Increase time)    Ambulation Distance (Feet)  286 Feet 2MWT    Assistive device  Straight cane    Gait Pattern  Step-through pattern;Decreased step length - right;Decreased stance time - right;Decreased stride length;Decreased weight shift to right;Trendelenburg;Antalgic    Ambulation Surface  Level;Indoor    Gait velocity  0.7 m/s      Standardized Balance Assessment   Standardized Balance Assessment  Timed Up and Go Test      Timed Up and Go Test   TUG  Normal TUG    Normal TUG (seconds)  15.03    TUG Comments  with SPC, patient with short steps throughout and unsteady gait         Objective measurements completed on examination: See above findings.      PT Education - 11/24/17 1741    Education Details  Educated on exam findings and appropriate POC. Edcuated on benefits of therapy and aquatic therapy as patient expressed interest in this. Provided aquatic therapy handout.    Person(s) Educated  Patient     Methods  Explanation;Handout    Comprehension  Verbalized understanding       PT Short Term Goals - 11/24/17 1750      PT SHORT TERM GOAL #1   Title  Patient will be independent with HEP, updated PRN, to improve balance and LE strength as well as reduce pain and improve mobility.     Time  2    Period  Weeks    Status  New    Target Date  12/08/17      PT SHORT TERM GOAL #2   Title  Patient will improve MMT for limited groups by 1/2 grade to demosntrate improved functional LE strength for more normalized gait and mobility.    Time  3    Period  Weeks    Status  New    Target Date  12/15/17      PT SHORT TERM GOAL #3   Title  Patient will improve TUG by 3 seconds to perform in 12 seconds or less with LRAD and normal step length/width indicating reduced fall risk with gait.    Time  3    Period  Weeks    Status  New        PT Long Term Goals - 11/24/17 1806      PT LONG TERM GOAL #1   Title  Patient will improve MMT for limited groups by 1 grade to demosntrate improved functional LE strength for more normalized gait and mobility.    Time  6    Period  Weeks    Status  New    Target Date  01/05/18      PT LONG TERM GOAL #2   Title  Patient will improve 5x sit to stand by 4 seconds to demonstrate significant improvement in functional LE strength.    Time  6    Period  Weeks    Status  New      PT LONG TERM GOAL #3   Title  Patient will perform 2MWT with LRAD and ambulate 100' farther than evaluation to demonstrate significant improvement in endurance and activity tolerance with gait to improve community access and reduce fall risk.    Time  6    Period  Weeks    Status  New         Plan - 11/24/17 1747    Clinical Impression Statement  Ms. Ferrin presents for initial evaluation with physical therapy for Rt thigh pain following a falls approximately 6 weeks ago. Sine her fall she has progressed from ambulating with a RW to a [facility], however prior to her fall she  did not require any AD to mobilize. She presents with weakness in bil LE, Rt>LT and pain with resistance testing for Rt hip musculature. She has tenderness to palpation around her greater trochanter on Rt LE and into her anterolateral thigh, impaired gait, decreased balance, and decreased activity tolerance secondary to pain. She is further limited with mobility by chronic Lt knee pain and poor cardiovascular endurance. Ms. Mikel will benefit from skilled PT interventions to address impairments and progress independence with mobility and reduce fall risk. She will benefit from aquatic physical therapy to perform greater intensity of exercise in unweighted setting to reduce pain and improve tolerance, and will benefit from the hydrostatic pressure properties of water to reduce swelling in LE to reduce pain and improve muscle activation.    Clinical Presentation  Stable    Clinical Presentation due to:  MMT, TUG, 2MWT, SLS, palpation, FOTO, Clinical judgement    Clinical Decision Making  Low    Rehab Potential  Good    PT Frequency  2x / week    PT Duration  6 weeks    PT Treatment/Interventions  Aquatic Therapy;ADLs/Self Care Home Management;Cryotherapy;Electrical Stimulation;Moist Heat;DME Instruction;Gait training;Stair training;Functional mobility training;Therapeutic activities;Therapeutic exercise;Balance training;Neuromuscular re-education;Patient/family education;Manual techniques;Passive range of motion    PT Next Visit Plan  Review eval and goals at next land based therapy session. Initiated hip strengthening bil (standing 4 way hip, supine bridges, clamshell, and balance training. Initiate aquatic therapy for LE strengthening and balance training. Incorporate aquatic pilates.    PT Home Exercise Plan  Provide at 2nd session: bridge, clamshell.    Consulted and Agree with Plan of Care  Patient       Patient will benefit from skilled therapeutic intervention in order to improve the following  deficits and impairments:  Abnormal gait, Increased fascial restricitons, Improper body mechanics, Pain, Postural dysfunction, Obesity, Increased muscle spasms, Decreased mobility, Decreased activity tolerance, Decreased endurance, Decreased strength, Increased edema, Difficulty walking, Decreased balance  Visit  Diagnosis: Pain in right thigh  Other abnormalities of gait and mobility  Muscle weakness (generalized)     Problem List Patient Active Problem List   Diagnosis Date Noted  . Atrial fibrillation ([place])   . Varicose veins of lower extremities with other complications 42/59/5638  . Chronic venous insufficiency 12/17/2012  . Postop Hyponatremia 11/05/2011  . OA (osteoarthritis) of knee 11/03/2011    Kipp Brood, PT, DPT Physical Therapist with [facility]  11/24/2017 6:14 PM    [place] [address] [place], [place], [address] Phone: [phone]   Fax:  [phone]  Name: Rebecca Cairns MRN: [id number] Date of Birth: 1946/01/04

## 2017-11-26 ENCOUNTER — Ambulatory Visit (HOSPITAL_COMMUNITY): Payer: Medicare Other | Attending: Orthopedic Surgery

## 2017-11-26 ENCOUNTER — Encounter (HOSPITAL_COMMUNITY): Payer: Self-pay

## 2017-11-26 DIAGNOSIS — R2689 Other abnormalities of gait and mobility: Secondary | ICD-10-CM | POA: Diagnosis not present

## 2017-11-26 DIAGNOSIS — M79651 Pain in right thigh: Secondary | ICD-10-CM | POA: Insufficient documentation

## 2017-11-26 DIAGNOSIS — M6281 Muscle weakness (generalized): Secondary | ICD-10-CM | POA: Insufficient documentation

## 2017-11-26 NOTE — Therapy (Signed)
[place], [place], [address] Phone: [phone]   Fax:  [phone]  Physical Therapy Treatment  Patient Details  Name: Brooke Luna MRN: [id number] Date of Birth: [birth year] Referring Provider: Nicholes Stairs, MD   Encounter Date: 11/26/2017  PT End of Session - 11/26/17 1532    Visit Number  2    Number of Visits  13    Date for PT Re-Evaluation  01/05/18    Authorization Type  [employer] Part A and B (secondary - [facility], tertiary - [place] healthcare) No auth required, 60 visit limit PT/OT/SLP - requires med reveiw after 30th visit    Authorization Time Period  11/24/17-01/08/18    Authorization - Visit Number  2    Authorization - Number of Visits  60    PT Start Time  1520    PT Stop Time  1558    PT Time Calculation (min)  38 min    Activity Tolerance  Patient tolerated treatment well    Behavior During Therapy  WFL for tasks assessed/performed       Past Medical History:  Diagnosis Date  . Arthritis    pain and oa  right knee and pain in rt leg and foot--also pain left knee-but right is worse  . Atrial fibrillation ([place])   . Back pain    lumbar bulging disk  . Contact dermatitis    underside of left leg--always in the same spots--comes and goes  . Diverticulosis   . GERD (gastroesophageal reflux disease)    diet control  . Hemorrhoids     Past Surgical History:  Procedure Laterality Date  . CATARACT EXTRACTION W/PHACO Left 02/22/2015   Procedure: CATARACT EXTRACTION PHACO AND INTRAOCULAR LENS PLACEMENT LEFT EYE CDE=5.41;  Surgeon: Tonny Branch, MD;  Location: [facility];  Service: Ophthalmology;  Laterality: Left;  . CATARACT EXTRACTION W/PHACO Right 03/26/2015   Procedure: CATARACT EXTRACTION PHACO AND INTRAOCULAR LENS PLACEMENT (IOC);  Surgeon: Tonny Branch, MD;  Location: [facility];  Service: Ophthalmology;  Laterality: Right;  CDE:5.01  . CHOLECYSTECTOMY    . surgery for  broken right elbow Right   . TOTAL KNEE ARTHROPLASTY  11/03/2011   Procedure: TOTAL KNEE ARTHROPLASTY;  Surgeon: Gearlean Alf, MD;  Location: [facility];  Service: Orthopedics;  Laterality: Right;  . TUBAL LIGATION      There were no vitals filed for this visit.  Subjective Assessment - 11/26/17 1525    Subjective  Pt reports current pain scale 3/10 Rt LE calf and thigh, achey pain.  No report of recent fall.    Pertinent History  fall ~ 6 weeks ago resulting in Rt radius fracture and torn/strained muscles of Rt hip/thigh    Patient Stated Goals  to be able to walk more normally again without pain    Currently in Pain?  Yes    Pain Score  3     Pain Location  Leg    Pain Orientation  Right thigh and calf    Pain Descriptors / Indicators  Aching    Pain Type  Chronic pain    Pain Onset  More than a month ago    Pain Frequency  Constant    Aggravating Factors   walking, standing, having leg hand down and not being propped up    Pain Relieving Factors  muscule relaxers, ice, ibuprofen    Effect of Pain on Daily Activities  severe limitation but improving  gradually                        [facility] Adult PT Treatment/Exercise - 11/26/17 0001      Exercises   Exercises  Knee/Hip      Knee/Hip Exercises: Standing   Hip Flexion  10 reps;Knee bent    Hip Flexion Limitations  marching alternating with HHA    Hip Abduction  Both;10 reps;Knee straight    Hip Extension  Both;10 reps;Knee straight    Gait Training  cueing for proper hand use and sequence with SPC x 100 ft      Knee/Hip Exercises: Seated   Sit to Sand  without UE support;5 reps      Knee/Hip Exercises: Supine   Bridges  10 reps    Other Supine Knee/Hip Exercises  clam RTB 10x BLE             PT Education - 11/26/17 1532    Education Details  Reviewed goals and copy of eval given to pt.  Educated importance and benefits with compliance iwht HEP, established HEP wiht ability to demonstrate and verbalize  appropriate form.      Person(s) Educated  Patient    Methods  Explanation;Demonstration;Handout    Comprehension  Verbalized understanding;Returned demonstration       PT Short Term Goals - 11/24/17 1750      PT SHORT TERM GOAL #1   Title  Patient will be independent with HEP, updated PRN, to improve balance and LE strength as well as reduce pain and improve mobility.     Time  2    Period  Weeks    Status  New    Target Date  12/08/17      PT SHORT TERM GOAL #2   Title  Patient will improve MMT for limited groups by 1/2 grade to demosntrate improved functional LE strength for more normalized gait and mobility.    Time  3    Period  Weeks    Status  New    Target Date  12/15/17      PT SHORT TERM GOAL #3   Title  Patient will improve TUG by 3 seconds to perform in 12 seconds or less with LRAD and normal step length/width indicating reduced fall risk with gait.    Time  3    Period  Weeks    Status  New        PT Long Term Goals - 11/24/17 1806      PT LONG TERM GOAL #1   Title  Patient will improve MMT for limited groups by 1 grade to demosntrate improved functional LE strength for more normalized gait and mobility.    Time  6    Period  Weeks    Status  New    Target Date  01/05/18      PT LONG TERM GOAL #2   Title  Patient will improve 5x sit to stand by 4 seconds to demonstrate significant improvement in functional LE strength.    Time  6    Period  Weeks    Status  New      PT LONG TERM GOAL #3   Title  Patient will perform 2MWT with LRAD and ambulate 100' farther than evaluation to demonstrate significant improvement in endurance and activity tolerance with gait to improve community access and reduce fall risk.    Time  6    Period  Weeks  Status  New            Plan - 11/26/17 1801    Clinical Impression Statement  Reviewed goals and copy of eval given to pt.  Session focus on LE strengthening primarly hip musculature.  Established HEP, pt able to  demonstrate and verbalize understanding of what is expected at home.  Pt educated on proper hand use with SPC and sequencing to reduce stress Rt LE.  Pt was limited by fatigue with activities, EOS pain scale 3/10.      Rehab Potential  Good    PT Frequency  2x / week    PT Duration  6 weeks    PT Treatment/Interventions  Aquatic Therapy;ADLs/Self Care Home Management;Cryotherapy;Electrical Stimulation;Moist Heat;DME Instruction;Gait training;Stair training;Functional mobility training;Therapeutic activities;Therapeutic exercise;Balance training;Neuromuscular re-education;Patient/family education;Manual techniques;Passive range of motion    PT Next Visit Plan  Review compliance iwht HEP. Initiated hip strengthening bil (standing 4 way hip, supine bridges, clamshell, and balance training. Initiate aquatic therapy for LE strengthening and balance training. Incorporate aquatic pilates.    PT Home Exercise Plan  11/26/17: bridge, clamshell.       Patient will benefit from skilled therapeutic intervention in order to improve the following deficits and impairments:  Abnormal gait, Increased fascial restricitons, Improper body mechanics, Pain, Postural dysfunction, Obesity, Increased muscle spasms, Decreased mobility, Decreased activity tolerance, Decreased endurance, Decreased strength, Increased edema, Difficulty walking, Decreased balance  Visit Diagnosis: Pain in right thigh  Other abnormalities of gait and mobility  Muscle weakness (generalized)     Problem List Patient Active Problem List   Diagnosis Date Noted  . Atrial fibrillation ([place])   . Varicose veins of lower extremities with other complications 64/15/8309  . Chronic venous insufficiency 12/17/2012  . Postop Hyponatremia 11/05/2011  . OA (osteoarthritis) of knee 11/03/2011   Ihor Austin, LPTA; CBIS [phone]  Aldona Lento 11/26/2017, 6:05 PM  [place], [place], [address] Phone: [phone]   Fax:  [phone]  Name: Brooke Luna MRN: [id number] Date of Birth: 01-Oct-1945

## 2017-11-26 NOTE — Patient Instructions (Signed)
Bridge    Lie back, legs bent. Inhale, pressing hips up. Keeping ribs in, lengthen lower back. Exhale, rolling down along spine from top. Repeat 10 times. Do 1-2 sessions per day.  http://pm.exer.us/55   Copyright  VHI. All rights reserved.     Laying on your back with hip and feet aligned neutral.  Knee bent, tie theraband around knees. Rotate hip dropping one knee off the side as you keep the other knee completely still. Hold for 5" then return to neutral.  Repeat 10 times then complete with the opposite side. 1-2 sets per day, minimal of 4 days a week.

## 2017-11-30 ENCOUNTER — Telehealth (HOSPITAL_COMMUNITY): Payer: Self-pay | Admitting: Internal Medicine

## 2017-11-30 ENCOUNTER — Ambulatory Visit (HOSPITAL_COMMUNITY): Payer: Medicare Other

## 2017-11-30 NOTE — Telephone Encounter (Signed)
11/30/17  wants to cx this whole week, she has a stomach virus and said that her husband had it and lasted him about the whole week

## 2017-12-02 ENCOUNTER — Encounter (HOSPITAL_COMMUNITY): Payer: Medicare Other

## 2017-12-07 ENCOUNTER — Ambulatory Visit (HOSPITAL_COMMUNITY): Payer: Medicare Other

## 2017-12-07 ENCOUNTER — Encounter (HOSPITAL_COMMUNITY): Payer: Self-pay

## 2017-12-07 ENCOUNTER — Other Ambulatory Visit: Payer: Self-pay

## 2017-12-07 DIAGNOSIS — M79651 Pain in right thigh: Secondary | ICD-10-CM

## 2017-12-07 DIAGNOSIS — R2689 Other abnormalities of gait and mobility: Secondary | ICD-10-CM

## 2017-12-07 DIAGNOSIS — M6281 Muscle weakness (generalized): Secondary | ICD-10-CM | POA: Diagnosis not present

## 2017-12-07 NOTE — Therapy (Signed)
[place], [place], [address] Phone: [phone]   Fax:  [phone]  Physical Therapy Aquatic Treatment  Patient Details  Name: Brooke Luna MRN: [id number] Date of Birth: 02/25/1946 Referring Provider: Nicholes Stairs, MD   Encounter Date: 12/07/2017  PT End of Session - 12/07/17 1620    Visit Number  3    Number of Visits  13    Date for PT Re-Evaluation  01/05/18    Authorization Type  [employer] Part A and B (secondary - [employer] [facility], tertiary - [place] healthcare) No auth required, 60 visit limit PT/OT/SLP - requires med reveiw after 30th visit    Authorization Time Period  11/24/17-01/08/18    Authorization - Visit Number  3    Authorization - Number of Visits  81    PT Start Time  2119    PT Stop Time  1423    PT Time Calculation (min)  40 min    Activity Tolerance  Patient tolerated treatment well    Behavior During Therapy  [facility] for tasks assessed/performed       Past Medical History:  Diagnosis Date  . Arthritis    pain and oa  right knee and pain in rt leg and foot--also pain left knee-but right is worse  . Atrial fibrillation ([place])   . Back pain    lumbar bulging disk  . Contact dermatitis    underside of left leg--always in the same spots--comes and goes  . Diverticulosis   . GERD (gastroesophageal reflux disease)    diet control  . Hemorrhoids     Past Surgical History:  Procedure Laterality Date  . CATARACT EXTRACTION W/PHACO Left 02/22/2015   Procedure: CATARACT EXTRACTION PHACO AND INTRAOCULAR LENS PLACEMENT LEFT EYE CDE=5.41;  Surgeon: Tonny Branch, MD;  Location: [facility];  Service: Ophthalmology;  Laterality: Left;  . CATARACT EXTRACTION W/PHACO Right 03/26/2015   Procedure: CATARACT EXTRACTION PHACO AND INTRAOCULAR LENS PLACEMENT (IOC);  Surgeon: Tonny Branch, MD;  Location: [facility];  Service: Ophthalmology;  Laterality: Right;  CDE:5.01  . CHOLECYSTECTOMY    . surgery  for broken right elbow Right   . TOTAL KNEE ARTHROPLASTY  11/03/2011   Procedure: TOTAL KNEE ARTHROPLASTY;  Surgeon: Gearlean Alf, MD;  Location: [facility];  Service: Orthopedics;  Laterality: Right;  . TUBAL LIGATION      There were no vitals filed for this visit.  Subjective Assessment - 12/07/17 1614    Subjective  Patient arrives to [facility] for her appointment. She reports some back pain but states her Rt thigh is doing much better and denies pain there today. She reports he Lt knee still bothers her.    Pertinent History  fall ~ 6 weeks ago resulting in Rt radius fracture and torn/strained muscles of Rt hip/thigh    How long can you sit comfortably?  sit as long as want (prefers legs to be elevated)    How long can you stand comfortably?  about 10 minutes max    How long can you walk comfortably?  about 10 minutes max    Patient Stated Goals  to be able to walk more normally again without pain    Currently in Pain?  Yes    Pain Score  4     Pain Location  Back    Pain Orientation  Lower    Pain Descriptors / Indicators  Aching    Pain Type  Chronic pain  Pain Onset  More than a month ago    Pain Frequency  Constant    Aggravating Factors   standing, walking, prolonged activity    Pain Relieving Factors  ice, medicine    Effect of Pain on Daily Activities  moderate limitation        Adult Aquatic Therapy - 12/07/17 1622      Aquatic Therapy Subjective   Subjective  Patient arrives to [facility] for her appointment. She reports some back pain but states her Rt thigh is doing much better and denies pain there today. She reports he Lt knee still bothers her.       Treatment   Gait  Forward walking, 2 laps. Side step along lane line, 3 RT.     Specific Exercises  Hip/Low Back    Hip/Low Back  Bicycle kicks with bil UE support on wall, 2x 1 minute. Pilates single leg stretch with noodle in front and floatation belt: 1x 10 reps Bil LE. Leg pull back with 2 HHA at wall: 1x 15 reps bil LE.  Leg press with noodle: 1x 15 reps bil LE. Side kick with 1 HHA at wall, forward/abduction to extension to adduction, 1x 15 reps bil LE. "Saw" lumbar rotation with oblique activation, 1x 10 reps bil. LAQ in standing with 1 HHA at wall, 1x 15 reps bil LE. Squat with heel raise combination, 1x 15 reps, 2 HHA. 2x 30 seconds gastroc stretch/runner stretch at wall bil LE. 1x 30 seconds quad stretch with noodle under fot, bil LE.         PT Education - 12/07/17 1616    Education Details  Educated on aquatic exercise this session and on delayed onset muscle sorenss which should improve about 2 days after exercsies but that she may be sore tomorrow from therapy session.    Person(s) Educated  Patient    Methods  Explanation;Demonstration;Verbal cues    Comprehension  Verbalized understanding;Returned demonstration       PT Short Term Goals - 11/24/17 1750      PT SHORT TERM GOAL #1   Title  Patient will be independent with HEP, updated PRN, to improve balance and LE strength as well as reduce pain and improve mobility.     Time  2    Period  Weeks    Status  New    Target Date  12/08/17      PT SHORT TERM GOAL #2   Title  Patient will improve MMT for limited groups by 1/2 grade to demosntrate improved functional LE strength for more normalized gait and mobility.    Time  3    Period  Weeks    Status  New    Target Date  12/15/17      PT SHORT TERM GOAL #3   Title  Patient will improve TUG by 3 seconds to perform in 12 seconds or less with LRAD and normal step length/width indicating reduced fall risk with gait.    Time  3    Period  Weeks    Status  New        PT Long Term Goals - 11/24/17 1806      PT LONG TERM GOAL #1   Title  Patient will improve MMT for limited groups by 1 grade to demosntrate improved functional LE strength for more normalized gait and mobility.    Time  6    Period  Weeks    Status  New  Target Date  01/05/18      PT LONG TERM GOAL #2   Title  Patient  will improve 5x sit to stand by 4 seconds to demonstrate significant improvement in functional LE strength.    Time  6    Period  Weeks    Status  New      PT LONG TERM GOAL #3   Title  Patient will perform 2MWT with LRAD and ambulate 100' farther than evaluation to demonstrate significant improvement in endurance and activity tolerance with gait to improve community access and reduce fall risk.    Time  6    Period  Weeks    Status  New        Plan - 12/07/17 1621    Clinical Impression Statement  Initiated aquatic therapy this session. Patient arrived with 4/10 pain and reported reduced soreness at EOS. She performed exercises with focus on LE strengthening for hips and quadriceps as well as core strengthening through Pilates based movements. She required verbal cues throughout session to slow down movements and focus on quality rather than completing the exercise as quickly as possible. LAQ irritated her Lt knee some but she denied discomfort with all other exercises. She will continue to benefit from skilled PT interventions on land and in water to address impairments and progress towards goals to improve mobility and reduce pain.    Rehab Potential  Good    PT Frequency  2x / week    PT Duration  6 weeks    PT Treatment/Interventions  Aquatic Therapy;ADLs/Self Care Home Management;Cryotherapy;Electrical Stimulation;Moist Heat;DME Instruction;Gait training;Stair training;Functional mobility training;Therapeutic activities;Therapeutic exercise;Balance training;Neuromuscular re-education;Patient/family education;Manual techniques;Passive range of motion    PT Next Visit Plan  Review compliance with HEP. Continue hip strengthening (standing 4-way hip, supine bridges, clamshell, and balance training. Continue with hip strengthening in aquatic therapy, incorporate Pilates, and balance exercises.     PT Home Exercise Plan  11/26/17: bridge, clamshell.    Consulted and Agree with Plan of Care   Patient       Patient will benefit from skilled therapeutic intervention in order to improve the following deficits and impairments:  Abnormal gait, Increased fascial restricitons, Improper body mechanics, Pain, Postural dysfunction, Obesity, Increased muscle spasms, Decreased mobility, Decreased activity tolerance, Decreased endurance, Decreased strength, Increased edema, Difficulty walking, Decreased balance  Visit Diagnosis: Pain in right thigh  Other abnormalities of gait and mobility  Muscle weakness (generalized)     Problem List Patient Active Problem List   Diagnosis Date Noted  . Atrial fibrillation ([place])   . Varicose veins of lower extremities with other complications 99/37/1696  . Chronic venous insufficiency 12/17/2012  . Postop Hyponatremia 11/05/2011  . OA (osteoarthritis) of knee 11/03/2011    Kipp Brood, PT, DPT Physical Therapist with [facility]  12/07/2017 4:48 PM    [place], [place], [address] Phone: [phone]   Fax:  [phone]  Name: Brooke Luna MRN: [id number] Date of Birth: [birth year]

## 2017-12-09 ENCOUNTER — Ambulatory Visit (HOSPITAL_COMMUNITY): Payer: Medicare Other

## 2017-12-09 ENCOUNTER — Other Ambulatory Visit (HOSPITAL_COMMUNITY): Payer: Self-pay | Admitting: Cardiology

## 2017-12-09 ENCOUNTER — Encounter (HOSPITAL_COMMUNITY): Payer: Self-pay

## 2017-12-09 DIAGNOSIS — R2689 Other abnormalities of gait and mobility: Secondary | ICD-10-CM

## 2017-12-09 DIAGNOSIS — M79651 Pain in right thigh: Secondary | ICD-10-CM | POA: Diagnosis not present

## 2017-12-09 DIAGNOSIS — M6281 Muscle weakness (generalized): Secondary | ICD-10-CM | POA: Diagnosis not present

## 2017-12-09 NOTE — Therapy (Signed)
[place], [place], [address] Phone: [phone]   Fax:  [phone]  Physical Therapy Treatment  Patient Details  Name: Brooke Luna MRN: [id number] Date of Birth: [birth year] Referring Provider: Nicholes Stairs, MD   Encounter Date: 12/09/2017  PT End of Session - 12/09/17 1659    Visit Number  4    Number of Visits  13    Date for PT Re-Evaluation  01/05/18    Authorization Type  [employer] Part A and B (secondary - [employer] [facility], tertiary - [place] healthcare) No auth required, 60 visit limit PT/OT/SLP - requires med reveiw after 30th visit    Authorization Time Period  11/24/17-01/08/18    Authorization - Visit Number  4    Authorization - Number of Visits  60    PT Start Time  2595    PT Stop Time  1729    PT Time Calculation (min)  38 min    Activity Tolerance  Patient tolerated treatment well    Behavior During Therapy  [facility] for tasks assessed/performed       Past Medical History:  Diagnosis Date  . Arthritis    pain and oa  right knee and pain in rt leg and foot--also pain left knee-but right is worse  . Atrial fibrillation ([place])   . Back pain    lumbar bulging disk  . Contact dermatitis    underside of left leg--always in the same spots--comes and goes  . Diverticulosis   . GERD (gastroesophageal reflux disease)    diet control  . Hemorrhoids     Past Surgical History:  Procedure Laterality Date  . CATARACT EXTRACTION W/PHACO Left 02/22/2015   Procedure: CATARACT EXTRACTION PHACO AND INTRAOCULAR LENS PLACEMENT LEFT EYE CDE=5.41;  Surgeon: Tonny Branch, MD;  Location: [facility];  Service: Ophthalmology;  Laterality: Left;  . CATARACT EXTRACTION W/PHACO Right 03/26/2015   Procedure: CATARACT EXTRACTION PHACO AND INTRAOCULAR LENS PLACEMENT (IOC);  Surgeon: Tonny Branch, MD;  Location: [facility];  Service: Ophthalmology;  Laterality: Right;  CDE:5.01  . CHOLECYSTECTOMY    . surgery for  broken right elbow Right   . TOTAL KNEE ARTHROPLASTY  11/03/2011   Procedure: TOTAL KNEE ARTHROPLASTY;  Surgeon: Gearlean Alf, MD;  Location: [facility];  Service: Orthopedics;  Laterality: Right;  . TUBAL LIGATION      There were no vitals filed for this visit.  Subjective Assessment - 12/09/17 1656    Subjective  Pt arrived without AD, reoprts increased swelling for Lt LE, no real pain just discomfort wiht weight bearing.    Pertinent History  fall ~ 6 weeks ago resulting in Rt radius fracture and torn/strained muscles of Rt hip/thigh    Patient Stated Goals  to be able to walk more normally again without pain    Currently in Pain?  No/denies    Pain Location  Leg    Pain Orientation  Left    Pain Descriptors / Indicators  Discomfort   swelling   Pain Type  Chronic pain    Pain Onset  More than a month ago    Pain Frequency  Constant    Aggravating Factors   standing, walking, prolonged activity    Pain Relieving Factors  ice, medicine    Effect of Pain on Daily Activities  moderate limation                    [facility] Adult  PT Treatment/Exercise - 12/09/17 0001      Exercises   Exercises  Knee/Hip      Knee/Hip Exercises: Standing   Hip Flexion  10 reps;Knee bent    Hip Flexion Limitations  marching alternating with HHA    Hip Abduction  Both;10 reps;Knee straight      Knee/Hip Exercises: Seated   Long Arc Quad  15 reps    Clamshell with TheraBand  Red   15x 3" holds each LE   Sit to Sand  10 reps;without UE support   eccentric control     Knee/Hip Exercises: Supine   Bridges  2 sets;10 reps      Manual Therapy   Manual Therapy  Edema management    Manual therapy comments  Manual complete separate than rest of tx    Edema Management  Retrograde massage for edema control wiht LE elevated massage to Lt LE               PT Short Term Goals - 11/24/17 1750      PT SHORT TERM GOAL #1   Title  Patient will be independent with HEP, updated PRN, to  improve balance and LE strength as well as reduce pain and improve mobility.     Time  2    Period  Weeks    Status  New    Target Date  12/08/17      PT SHORT TERM GOAL #2   Title  Patient will improve MMT for limited groups by 1/2 grade to demosntrate improved functional LE strength for more normalized gait and mobility.    Time  3    Period  Weeks    Status  New    Target Date  12/15/17      PT SHORT TERM GOAL #3   Title  Patient will improve TUG by 3 seconds to perform in 12 seconds or less with LRAD and normal step length/width indicating reduced fall risk with gait.    Time  3    Period  Weeks    Status  New        PT Long Term Goals - 11/24/17 1806      PT LONG TERM GOAL #1   Title  Patient will improve MMT for limited groups by 1 grade to demosntrate improved functional LE strength for more normalized gait and mobility.    Time  6    Period  Weeks    Status  New    Target Date  01/05/18      PT LONG TERM GOAL #2   Title  Patient will improve 5x sit to stand by 4 seconds to demonstrate significant improvement in functional LE strength.    Time  6    Period  Weeks    Status  New      PT LONG TERM GOAL #3   Title  Patient will perform 2MWT with LRAD and ambulate 100' farther than evaluation to demonstrate significant improvement in endurance and activity tolerance with gait to improve community access and reduce fall risk.    Time  6    Period  Weeks    Status  New            Plan - 12/09/17 1728    Clinical Impression Statement  Pt called prior session and arrived to session with concern of Lt LE edema.  Wells clinical prediction rule used to complete screening and ruled out DVT possibilities with score  of 0; no calf pain, no heat or redness just edema present proximal knee and foot based Wells clinical predection rule, pt does not have DVT.  Continued session focus for LE strengthening per PT POC.  Pt with reports of increased pain opposite LE with SLS  activities (standing abduction and extension) so regressed to seated exercises with improve tolerance to exercise.  EOS with manual retro grade massage to assist with edema control for improved tolerance with weight bearing activities and gait.    Rehab Potential  Good    PT Frequency  2x / week    PT Duration  6 weeks    PT Treatment/Interventions  Aquatic Therapy;ADLs/Self Care Home Management;Cryotherapy;Electrical Stimulation;Moist Heat;DME Instruction;Gait training;Stair training;Functional mobility training;Therapeutic activities;Therapeutic exercise;Balance training;Neuromuscular re-education;Patient/family education;Manual techniques;Passive range of motion    PT Next Visit Plan  Review compliance with HEP. Continue hip strengthening (standing 4-way hip, supine bridges, clamshell, and balance training. Continue with hip strengthening in aquatic therapy, incorporate Pilates, and balance exercises.     PT Home Exercise Plan  11/26/17: bridge, clamshell.       Patient will benefit from skilled therapeutic intervention in order to improve the following deficits and impairments:  Abnormal gait, Increased fascial restricitons, Improper body mechanics, Pain, Postural dysfunction, Obesity, Increased muscle spasms, Decreased mobility, Decreased activity tolerance, Decreased endurance, Decreased strength, Increased edema, Difficulty walking, Decreased balance  Visit Diagnosis: Pain in right thigh  Other abnormalities of gait and mobility  Muscle weakness (generalized)     Problem List Patient Active Problem List   Diagnosis Date Noted  . Atrial fibrillation ([place])   . Varicose veins of lower extremities with other complications 53/97/6734  . Chronic venous insufficiency 12/17/2012  . Postop Hyponatremia 11/05/2011  . OA (osteoarthritis) of knee 11/03/2011   Ihor Austin, LPTA; CBIS [phone]  Aldona Lento 12/09/2017, 6:35 PM  [facility] [address] [place], [place], [address] Phone: [phone]   Fax:  [phone]  Name: Brooke Luna MRN: [id number] Date of Birth: Aug 28, 1945

## 2017-12-14 ENCOUNTER — Telehealth (HOSPITAL_COMMUNITY): Payer: Self-pay | Admitting: Internal Medicine

## 2017-12-14 NOTE — Telephone Encounter (Signed)
12/14/17  pt called and wanted to change this appt to 8/22 instead

## 2017-12-16 ENCOUNTER — Encounter (HOSPITAL_COMMUNITY): Payer: Medicare Other

## 2017-12-17 ENCOUNTER — Encounter (HOSPITAL_COMMUNITY): Payer: Self-pay

## 2017-12-17 ENCOUNTER — Ambulatory Visit (HOSPITAL_COMMUNITY): Payer: Medicare Other

## 2017-12-17 ENCOUNTER — Telehealth (HOSPITAL_COMMUNITY): Payer: Self-pay | Admitting: Internal Medicine

## 2017-12-17 DIAGNOSIS — M79651 Pain in right thigh: Secondary | ICD-10-CM

## 2017-12-17 DIAGNOSIS — M6281 Muscle weakness (generalized): Secondary | ICD-10-CM | POA: Diagnosis not present

## 2017-12-17 DIAGNOSIS — R2689 Other abnormalities of gait and mobility: Secondary | ICD-10-CM

## 2017-12-17 NOTE — Telephone Encounter (Signed)
12/17/17  pt left a message that she got sick and hope it isn't to late to cx appt

## 2017-12-17 NOTE — Therapy (Signed)
[facility] [address] [place], [place], [address] Phone: [phone]   Fax:  [phone]  Physical Therapy Treatment  Patient Details  Name: Brooke Luna MRN: [id number] Date of Birth: 11/19/1945 Referring Provider: Nicholes Stairs, MD   Encounter Date: 12/17/2017  PT End of Session - 12/17/17 1405    Visit Number  5    Number of Visits  13    Date for PT Re-Evaluation  01/05/18    Authorization Type  [employer] Part A and [employer] (secondary - [employer] [facility], tertiary - [place] healthcare) No auth required, 60 visit limit PT/OT/SLP - requires med reveiw after 30th visit    Authorization Time Period  11/24/17-01/08/18    Authorization - Visit Number  5    Authorization - Number of Visits  60    PT Start Time  9528    PT Stop Time  1433    PT Time Calculation (min)  38 min    Activity Tolerance  Patient tolerated treatment well    Behavior During Therapy  [facility] for tasks assessed/performed       Past Medical History:  Diagnosis Date  . Arthritis    pain and oa  right knee and pain in rt leg and foot--also pain left knee-but right is worse  . Atrial fibrillation ([place])   . Back pain    lumbar bulging disk  . Contact dermatitis    underside of left leg--always in the same spots--comes and goes  . Diverticulosis   . GERD (gastroesophageal reflux disease)    diet control  . Hemorrhoids     Past Surgical History:  Procedure Laterality Date  . CATARACT EXTRACTION W/PHACO Left 02/22/2015   Procedure: CATARACT EXTRACTION PHACO AND INTRAOCULAR LENS PLACEMENT LEFT EYE CDE=5.41;  Surgeon: Tonny Branch, MD;  Location: [facility];  Service: Ophthalmology;  Laterality: Left;  . CATARACT EXTRACTION W/PHACO Right 03/26/2015   Procedure: CATARACT EXTRACTION PHACO AND INTRAOCULAR LENS PLACEMENT (IOC);  Surgeon: Tonny Branch, MD;  Location: [facility];  Service: Ophthalmology;  Laterality: Right;  CDE:5.01  . CHOLECYSTECTOMY    . surgery for  broken right elbow Right   . TOTAL KNEE ARTHROPLASTY  11/03/2011   Procedure: TOTAL KNEE ARTHROPLASTY;  Surgeon: Gearlean Alf, MD;  Location: [facility];  Service: Orthopedics;  Laterality: Right;  . TUBAL LIGATION      There were no vitals filed for this visit.  Subjective Assessment - 12/17/17 1402    Subjective  Pt stated BLE are swollen today, no real pain.  Pt with headache on frontal aspect of head, pain scale 2/10.      Patient Stated Goals  to be able to walk more normally again without pain    Currently in Pain?  Yes    Pain Score  2     Pain Location  Head    Pain Orientation  --   frontal aspect of head   Pain Descriptors / Indicators  Aching    Pain Onset  More than a month ago    Pain Frequency  Constant    Aggravating Factors   standing, walking, prolonged activity1    Pain Relieving Factors  ice, medicine    Effect of Pain on Daily Activities  moderate limation                       [facility] Adult PT Treatment/Exercise - 12/17/17 0001      Exercises  Exercises  Knee/Hip      Knee/Hip Exercises: Stretches   Gastroc Stretch  2 reps;30 seconds      Knee/Hip Exercises: Standing   Hip Flexion  10 reps;Knee bent    Hip Flexion Limitations  marching alternating with HHA    Hip Abduction  Both;10 reps;Knee straight    Other Standing Knee Exercises  tandem stance 2x 30" on solid ground      Knee/Hip Exercises: Supine   Bridges  15 reps    Other Supine Knee/Hip Exercises  ankle pumps       Manual Therapy   Manual Therapy  Edema management    Manual therapy comments  Manual complete separate than rest of tx    Edema Management  Retrograde massage for edema control wiht LE elevated massage to Lt LE               PT Short Term Goals - 11/24/17 1750      PT SHORT TERM GOAL #1   Title  Patient will be independent with HEP, updated PRN, to improve balance and LE strength as well as reduce pain and improve mobility.     Time  2    Period  Weeks     Status  New    Target Date  12/08/17      PT SHORT TERM GOAL #2   Title  Patient will improve MMT for limited groups by 1/2 grade to demosntrate improved functional LE strength for more normalized gait and mobility.    Time  3    Period  Weeks    Status  New    Target Date  12/15/17      PT SHORT TERM GOAL #3   Title  Patient will improve TUG by 3 seconds to perform in 12 seconds or less with LRAD and normal step length/width indicating reduced fall risk with gait.    Time  3    Period  Weeks    Status  New        PT Long Term Goals - 11/24/17 1806      PT LONG TERM GOAL #1   Title  Patient will improve MMT for limited groups by 1 grade to demosntrate improved functional LE strength for more normalized gait and mobility.    Time  6    Period  Weeks    Status  New    Target Date  01/05/18      PT LONG TERM GOAL #2   Title  Patient will improve 5x sit to stand by 4 seconds to demonstrate significant improvement in functional LE strength.    Time  6    Period  Weeks    Status  New      PT LONG TERM GOAL #3   Title  Patient will perform 2MWT with LRAD and ambulate 100' farther than evaluation to demonstrate significant improvement in endurance and activity tolerance with gait to improve community access and reduce fall risk.    Time  6    Period  Weeks    Status  New            Plan - 12/17/17 1617    Clinical Impression Statement  Pt arrived wiht c/o headache and Bil Lt>Rt LE swelling present.  Session focus on hip strengthening and additional dynamic balance activities.  Min A and HHA required with tandem stance on foam, cueing to improve focal awareness to assist with balance activities.  EOS with  manual retro massage for edema control, reports of improved ankle mobility with gait.      Rehab Potential  Good    PT Frequency  2x / week    PT Duration  6 weeks    PT Treatment/Interventions  Aquatic Therapy;ADLs/Self Care Home Management;Cryotherapy;Electrical  Stimulation;Moist Heat;DME Instruction;Gait training;Stair training;Functional mobility training;Therapeutic activities;Therapeutic exercise;Balance training;Neuromuscular re-education;Patient/family education;Manual techniques;Passive range of motion    PT Next Visit Plan  Continue hip strengthening (standing 4-way hip, supine bridges, clamshell, and balance training. Continue with hip strengthening in aquatic therapy, incorporate Pilates, and balance exercises.        Patient will benefit from skilled therapeutic intervention in order to improve the following deficits and impairments:  Abnormal gait, Increased fascial restricitons, Improper body mechanics, Pain, Postural dysfunction, Obesity, Increased muscle spasms, Decreased mobility, Decreased activity tolerance, Decreased endurance, Decreased strength, Increased edema, Difficulty walking, Decreased balance  Visit Diagnosis: Pain in right thigh  Other abnormalities of gait and mobility  Muscle weakness (generalized)     Problem List Patient Active Problem List   Diagnosis Date Noted  . Atrial fibrillation ([place])   . Varicose veins of lower extremities with other complications 75/08/1831  . Chronic venous insufficiency 12/17/2012  . Postop Hyponatremia 11/05/2011  . OA (osteoarthritis) of knee 11/03/2011   Ihor Austin, LPTA; CBIS [phone]  Aldona Lento 12/17/2017, 4:23 PM  [facility] [address] [place], [place], [address] Phone: [phone]   Fax:  [phone]  Name: Brooke Luna MRN: [id number] Date of Birth: 1946/02/25

## 2017-12-21 ENCOUNTER — Ambulatory Visit (HOSPITAL_COMMUNITY): Payer: Medicare Other

## 2017-12-21 ENCOUNTER — Encounter (HOSPITAL_COMMUNITY): Payer: Self-pay

## 2017-12-21 ENCOUNTER — Other Ambulatory Visit: Payer: Self-pay

## 2017-12-21 DIAGNOSIS — R2689 Other abnormalities of gait and mobility: Secondary | ICD-10-CM

## 2017-12-21 DIAGNOSIS — M6281 Muscle weakness (generalized): Secondary | ICD-10-CM | POA: Diagnosis not present

## 2017-12-21 DIAGNOSIS — M79651 Pain in right thigh: Secondary | ICD-10-CM | POA: Diagnosis not present

## 2017-12-21 NOTE — Therapy (Signed)
[place], [place], [address] Phone: [phone]   Fax:  [phone]  Physical Therapy Aquatic Treatment  Patient Details  Name: Brooke Luna MRN: [id number] Date of Birth: [birth year] Referring Provider: Nicholes Stairs, MD   Encounter Date: 12/21/2017  PT End of Session - 12/21/17 1546    Visit Number  6    Number of Visits  13    Date for PT Re-Evaluation  01/05/18    Authorization Type  [employer] Part A and B (secondary - [facility], tertiary - [place] [employer]) No auth required, 60 visit limit PT/OT/SLP - requires med reveiw after 30th visit    Authorization Time Period  11/24/17-01/08/18    Authorization - Visit Number  6    Authorization - Number of Visits  60    PT Start Time  1329    PT Stop Time  1412    PT Time Calculation (min)  43 min    Activity Tolerance  Patient tolerated treatment well    Behavior During Therapy  [facility] for tasks assessed/performed       Past Medical History:  Diagnosis Date  . Arthritis    pain and oa  right knee and pain in rt leg and foot--also pain left knee-but right is worse  . Atrial fibrillation ([place])   . Back pain    lumbar bulging disk  . Contact dermatitis    underside of left leg--always in the same spots--comes and goes  . Diverticulosis   . GERD (gastroesophageal reflux disease)    diet control  . Hemorrhoids     Past Surgical History:  Procedure Laterality Date  . CATARACT EXTRACTION W/PHACO Left 02/22/2015   Procedure: CATARACT EXTRACTION PHACO AND INTRAOCULAR LENS PLACEMENT LEFT EYE CDE=5.41;  Surgeon: Tonny Branch, MD;  Location: [facility];  Service: Ophthalmology;  Laterality: Left;  . CATARACT EXTRACTION W/PHACO Right 03/26/2015   Procedure: CATARACT EXTRACTION PHACO AND INTRAOCULAR LENS PLACEMENT (IOC);  Surgeon: Tonny Branch, MD;  Location: [facility];  Service: Ophthalmology;  Laterality: Right;  CDE:5.01  . CHOLECYSTECTOMY    . surgery  for broken right elbow Right   . TOTAL KNEE ARTHROPLASTY  11/03/2011   Procedure: TOTAL KNEE ARTHROPLASTY;  Surgeon: Gearlean Alf, MD;  Location: [facility];  Service: Orthopedics;  Laterality: Right;  . TUBAL LIGATION      There were no vitals filed for this visit.  Subjective Assessment - 12/21/17 1541    Subjective  Patient reports her legs are mroe swollen today and she has made an appointment with her PCP to talk to them about the swelling. She reports her Lt hip is bothering her now but that her right leg is feeling a little better.     Pertinent History  fall ~ 6 weeks ago resulting in Rt radius fracture and torn/strained muscles of Rt hip/thigh    Limitations  Walking    How long can you sit comfortably?  sit as long as want (prefers legs to be elevated)    How long can you stand comfortably?  about 10 minutes max    How long can you walk comfortably?  about 10 minutes max    Patient Stated Goals  to be able to walk more normally again without pain    Currently in Pain?  Yes    Pain Score  3     Pain Location  Leg    Pain Orientation  Left  Pain Descriptors / Indicators  Aching;Sore    Pain Type  Chronic pain    Pain Onset  More than a month ago    Pain Frequency  Intermittent    Aggravating Factors   prolonged activity    Pain Relieving Factors  medicine, ice         Adult Aquatic Therapy - 12/21/17 1556      Aquatic Therapy Subjective   Subjective  Patient reports her legs are mroe swollen today and she has made an appointment with her PCP to talk to them about the swelling. She reports her Lt hip is bothering her now but that her right leg is feeling a little better.       Treatment   Gait   Forward walking, 3 laps. Side step along lane line, 2 RT. (full length of pool area this date)    Exercises   Deep Water (XL floatation belt on): Bicycle kicks 2x 2 minutes; 2x 1 minute vertical scissors kicks, 2x 1 minute abd/add jumping jack kicks.    Specific Exercises  Hip/Low  Back    Hip/Low Back  Leg pull back with 2 HHA at wall: 1x 15 reps bil LE (8lbs on bil LE). Leg press with noodle: 1x 15 reps bil LE. Side kick with 1 HHA at wall, forward/abduction to extension to adduction, 1x 15 reps bil LE. "Saw" lumbar rotation with oblique activation, 1x 10 reps bil. LAQ in standing with 1 HHA at wall, 1x 15 reps bil LE. Squat with heel raise combination, 1x 15 reps, 2 HHA. 2x 30 seconds gastroc stretch/runner stretch at wall bil LE. 2x 30 seconds knee flexion stretch on 2nd step (Rt LE), 2x 30 seconds hamstring stretch on 2nd step (Rt LE).         PT Education - 12/21/17 1546    Education Details  educated on aquatic deep water exercises. Discussed possible source of lymphedema for bil LE swellign as patient Lt LE is notably greater than Rt LE in edema and she reports her daughter has lymphedema.     Person(s) Educated  Patient    Methods  Explanation    Comprehension  Verbalized understanding       PT Short Term Goals - 12/21/17 1555      PT SHORT TERM GOAL #1   Title  Patient will be independent with HEP, updated PRN, to improve balance and LE strength as well as reduce pain and improve mobility.     Time  2    Period  Weeks    Status  On-going      PT SHORT TERM GOAL #2   Title  Patient will improve MMT for limited groups by 1/2 grade to demosntrate improved functional LE strength for more normalized gait and mobility.    Time  3    Period  Weeks    Status  On-going      PT SHORT TERM GOAL #3   Title  Patient will improve TUG by 3 seconds to perform in 12 seconds or less with LRAD and normal step length/width indicating reduced fall risk with gait.    Time  3    Period  Weeks    Status  On-going        PT Long Term Goals - 12/21/17 1556      PT LONG TERM GOAL #1   Title  Patient will improve MMT for limited groups by 1 grade to demosntrate improved functional LE strength for  more normalized gait and mobility.    Time  6    Period  Weeks    Status   On-going      PT LONG TERM GOAL #2   Title  Patient will improve 5x sit to stand by 4 seconds to demonstrate significant improvement in functional LE strength.    Time  6    Period  Weeks    Status  On-going      PT LONG TERM GOAL #3   Title  Patient will perform 2MWT with LRAD and ambulate 100' farther than evaluation to demonstrate significant improvement in endurance and activity tolerance with gait to improve community access and reduce fall risk.    Time  6    Period  Weeks    Status  On-going        Plan - 12/21/17 1548    Clinical Impression Statement  Continued with aquatic therapy this session. Patient arrived with 3/10 pain and reported reduced soreness at EOS. She performed exercises with focus on LE strengthening for hips and quadriceps as well as core strengthening. Deep water exercises were performed today with floatation belt for LE strength and endurance training. Patient had improved ability to maintain upright position and engage abdominals to perform bicycles, scissors, and jumping jack kicks in deep water. She continues to require verbal cues throughout session to slow down movements and focus on quality. LAQ continues to aggravate Rt knee some but patient reports minimal discomfort. She will continue to benefit from skilled PT interventions on land and in water to address impairments and progress towards goals to improve mobility and reduce pain.    Rehab Potential  Good    PT Frequency  2x / week    PT Duration  6 weeks    PT Treatment/Interventions  Aquatic Therapy;ADLs/Self Care Home Management;Cryotherapy;Electrical Stimulation;Moist Heat;DME Instruction;Gait training;Stair training;Functional mobility training;Therapeutic activities;Therapeutic exercise;Balance training;Neuromuscular re-education;Patient/family education;Manual techniques;Passive range of motion    PT Next Visit Plan  Continue hip strengthening (standing 4-way hip, supine bridges, clamshell, and  balance training. Continue with hip strengthening in aquatic therapy, incorporate Pilates, and balance exercises. Discuss/educate on lymphedema and on benefit of compression garment to address swelling.    PT Home Exercise Plan  11/26/17: bridge, clamshell.    Consulted and Agree with Plan of Care  Patient       Patient will benefit from skilled therapeutic intervention in order to improve the following deficits and impairments:  Abnormal gait, Increased fascial restricitons, Improper body mechanics, Pain, Postural dysfunction, Obesity, Increased muscle spasms, Decreased mobility, Decreased activity tolerance, Decreased endurance, Decreased strength, Increased edema, Difficulty walking, Decreased balance  Visit Diagnosis: Pain in right thigh  Other abnormalities of gait and mobility  Muscle weakness (generalized)     Problem List Patient Active Problem List   Diagnosis Date Noted  . Atrial fibrillation ([place])   . Varicose veins of lower extremities with other complications 95/18/8416  . Chronic venous insufficiency 12/17/2012  . Postop Hyponatremia 11/05/2011  . OA (osteoarthritis) of knee 11/03/2011    Kipp Brood, PT, DPT Physical Therapist with [facility]  12/21/2017 4:01 PM    [place] [address] [place], [place], [address] Phone: [phone]   Fax:  [phone]  Name: Brooke Luna MRN: [id number] Date of Birth: 04-14-1946

## 2017-12-23 ENCOUNTER — Ambulatory Visit (HOSPITAL_COMMUNITY): Payer: Medicare Other

## 2017-12-23 ENCOUNTER — Encounter (HOSPITAL_COMMUNITY): Payer: Self-pay

## 2017-12-23 DIAGNOSIS — M6281 Muscle weakness (generalized): Secondary | ICD-10-CM

## 2017-12-23 DIAGNOSIS — M79651 Pain in right thigh: Secondary | ICD-10-CM | POA: Diagnosis not present

## 2017-12-23 DIAGNOSIS — R2689 Other abnormalities of gait and mobility: Secondary | ICD-10-CM | POA: Diagnosis not present

## 2017-12-23 NOTE — Therapy (Addendum)
[place], [place], [address] Phone: [phone]   Fax:  [phone]  Physical Therapy Treatment  Patient Details  Name: Brooke Luna MRN: [id number] Date of Birth: 07-08-1945 Referring Provider: Nicholes Stairs, MD   Encounter Date: 12/23/2017  PT End of Session - 12/23/17 1520    Visit Number  7    Number of Visits  13    Date for PT Re-Evaluation  01/05/18    Authorization Type  [employer] Part A and B (secondary - [employer] [facility], tertiary - [place] [employer]) No auth required, 60 visit limit PT/OT/SLP - requires med reveiw after 30th visit    Authorization Time Period  11/24/17-01/08/18    Authorization - Visit Number  7    Authorization - Number of Visits  60    PT Start Time  1324    PT Stop Time  1518    PT Time Calculation (min)  42 min    Activity Tolerance  Patient tolerated treatment well;Patient limited by fatigue;No increased pain    Behavior During Therapy  WFL for tasks assessed/performed       Past Medical History:  Diagnosis Date  . Arthritis    pain and oa  right knee and pain in rt leg and foot--also pain left knee-but right is worse  . Atrial fibrillation ([place])   . Back pain    lumbar bulging disk  . Contact dermatitis    underside of left leg--always in the same spots--comes and goes  . Diverticulosis   . GERD (gastroesophageal reflux disease)    diet control  . Hemorrhoids     Past Surgical History:  Procedure Laterality Date  . CATARACT EXTRACTION W/PHACO Left 02/22/2015   Procedure: CATARACT EXTRACTION PHACO AND INTRAOCULAR LENS PLACEMENT LEFT EYE CDE=5.41;  Surgeon: Tonny Branch, MD;  Location: [facility];  Service: Ophthalmology;  Laterality: Left;  . CATARACT EXTRACTION W/PHACO Right 03/26/2015   Procedure: CATARACT EXTRACTION PHACO AND INTRAOCULAR LENS PLACEMENT (IOC);  Surgeon: Tonny Branch, MD;  Location: [facility];  Service: Ophthalmology;  Laterality: Right;   CDE:5.01  . CHOLECYSTECTOMY    . surgery for broken right elbow Right   . TOTAL KNEE ARTHROPLASTY  11/03/2011   Procedure: TOTAL KNEE ARTHROPLASTY;  Surgeon: Gearlean Alf, MD;  Location: [facility];  Service: Orthopedics;  Laterality: Right;  . TUBAL LIGATION      There were no vitals filed for this visit.  Subjective Assessment - 12/23/17 1439    Subjective  Lt stated she continues to have swelling Lt >Rt knees to feet, pain scale 3/10.  Pt plans to discuss the swelling with PCP on Friday.      How long can you sit comfortably?  sit as long as want (prefers legs to be elevated)    How long can you stand comfortably?  able to stand for 10 minutes prior need to sit (was about 10 minutes max)    How long can you walk comfortably?  able to walk about 10 minutes max (was about 10 minutes max)    Patient Stated Goals  to be able to walk more normally again without pain    Currently in Pain?  Yes    Pain Score  3     Pain Location  Leg    Pain Orientation  Left;Right;Distal    Pain Descriptors / Indicators  Aching;Sore    Pain Type  Chronic pain    Pain Onset  More than a month ago    Pain Frequency  Intermittent    Aggravating Factors   prolonged activity    Pain Relieving Factors  medicine, ice     Effect of Pain on Daily Activities  moderate limation         [facility] PT Assessment - 12/23/17 0001      Assessment   Medical Diagnosis  Strain of Rt Thigh Muscles    Referring Provider  Nicholes Stairs, MD    Onset Date/Surgical Date  --   approximately 6 weeks ago   Next MD Visit  8/30    Prior Therapy  ~ 5 years ago for TKA on Rt LE      Precautions   Precautions  None    Precaution Comments  Pt has a hairline fracture of Rt radius, but declined splinting by MD, was not informed of WB restrictions      Functional Tests   Functional tests  Single leg stance      Single Leg Stance   Comments  SLS Lt 3", Rt 4" max of 5   was 0" BLE     Posture/Postural Control    Posture/Postural Control  Postural limitations    Postural Limitations  Rounded Shoulders;Forward head;Increased thoracic kyphosis      ROM / Strength   AROM / PROM / Strength  Strength      Strength   Strength Assessment Site  Hip;Knee;Ankle    Right Hip Flexion  4-/5   was 4-   Right Hip Extension  4/5   was 4/5   Right Hip ABduction  4/5   was 4-/5   Left Hip Flexion  4-/5   was 4/5   Left Hip Extension  4/5   was 4/5   Left Hip ABduction  4+/5   was 4/5   Right/Left Knee  Right;Left    Right Knee Flexion  4+/5   was 4/5   Right Knee Extension  4+/5   was 4/5   Left Knee Flexion  4/5   was 4-/5   Left Knee Extension  4+/5   was 4-/5   Right Ankle Dorsiflexion  4+/5   was 4+   Left Ankle Dorsiflexion  4+/5   was 4+     Ambulation/Gait   Ambulation/Gait  Yes    Ambulation/Gait Assistance  7: Independent    Ambulation Distance (Feet)  500 Feet   2MWT without AD was 286 with SPC,    Assistive device  None    Gait Pattern  Step-through pattern;Decreased step length - right;Decreased stance time - right;Decreased stride length;Decreased weight shift to right;Trendelenburg;Antalgic    Ambulation Surface  Level;Indoor    Gait velocity  1.27 m/s   was .7 m/s     Standardized Balance Assessment   Standardized Balance Assessment  Timed Up and Go Test      Timed Up and Go Test   TUG  Normal TUG    Normal TUG (seconds)  9.52   was 15.03   TUG Comments  without AD, no HHA                   [facility] Adult PT Treatment/Exercise - 12/23/17 0001      Transfers   Five time sit to stand comments   5 STS 10.78"   was 18.03" without HHA     Knee/Hip Exercises: Standing   Hip Flexion  10 reps;Knee bent    Hip Flexion  Limitations  marching alternating with HHA    Hip Abduction  Both;10 reps;Knee straight   RTB with 3" holds   Hip Extension  Both;10 reps;Knee straight   RTB with 3" holds   Gait Training  SLS Lt 3", Rt 4" max of 5    Other Standing Knee  Exercises  tandem stance 2x 30" on solid ground      Knee/Hip Exercises: Seated   Sit to Sand  5 reps;without UE support   5 STS 10.78"     Manual Therapy   Manual Therapy  Edema management    Manual therapy comments  Manual complete separate than rest of tx    Edema Management  Retrograde massage for edema control wiht LE elevated massage to Lt LE               PT Short Term Goals - 12/23/17 1444      PT SHORT TERM GOAL #1   Title  Patient will be independent with HEP, updated PRN, to improve balance and LE strength as well as reduce pain and improve mobility.     Baseline  8/28:  reports compliance iwth some of the exercises daily    Status  Achieved      PT SHORT TERM GOAL #2   Title  Patient will improve MMT for limited groups by 1/2 grade to demosntrate improved functional LE strength for more normalized gait and mobility.    Baseline  8/28: see MMT    Status  On-going      PT SHORT TERM GOAL #3   Title  Patient will improve TUG by 3 seconds to perform in 12 seconds or less with LRAD and normal step length/width indicating reduced fall risk with gait.    Baseline  8/28: TUG 9.52" (was 15.03")    Status  Achieved        PT Long Term Goals - 12/23/17 1444      PT LONG TERM GOAL #1   Title  Patient will improve MMT for limited groups by 1 grade to demosntrate improved functional LE strength for more normalized gait and mobility.    Baseline  see MMT    Status  On-going      PT LONG TERM GOAL #2   Title  Patient will improve 5x sit to stand by 4 seconds to demonstrate significant improvement in functional LE strength.    Baseline  12/23/17: 5STS 10.78" (was 18.03")    Status  Achieved      PT LONG TERM GOAL #3   Title  Patient will perform 2MWT with LRAD and ambulate 100' farther than evaluation to demonstrate significant improvement in endurance and activity tolerance with gait to improve community access and reduce fall risk.    Baseline  12/23/17: 2MWT 561ft  without AD (was 286 ft with [facility])    Status  Achieved            Plan - 12/23/17 1530    Clinical Impression Statement  Reviewed goals prior MD apt scheduled on Friday pertaining to edema BLE.  Pt progressing well with functional abilities including ambulating at increased velocity wiht no AD, improvements wiht TUG and 5 STS.  Pt does continue to exhibit gluteal weakness and decreased balance that increase rick of fall.  Pt also continues to be limited by pain and edema presented Bil LE.  Education and discussion about benefits of compression hose to assist with edema control, pt concerned wiht abilities to place  hose onto LE, may benefit from compression garmet that are donned via velcro.  EOS with manual retrograde massage for edema and pain control with reports of relief following.      Rehab Potential  Good    PT Frequency  2x / week    PT Duration  6 weeks    PT Treatment/Interventions  Aquatic Therapy;ADLs/Self Care Home Management;Cryotherapy;Electrical Stimulation;Moist Heat;DME Instruction;Gait training;Stair training;Functional mobility training;Therapeutic activities;Therapeutic exercise;Balance training;Neuromuscular re-education;Patient/family education;Manual techniques;Passive range of motion    PT Next Visit Plan  F/U with MD.  Continue hip strengthening (standing 4-way hip, supine bridges, clamshell, and balance training. Continue with hip strengthening in aquatic therapy, incorporate Pilates, and balance exercises. Discuss/educate on lymphedema and on benefit of compression garment to address swelling.    PT Home Exercise Plan  11/26/17: bridge, clamshell.       Patient will benefit from skilled therapeutic intervention in order to improve the following deficits and impairments:  Abnormal gait, Increased fascial restricitons, Improper body mechanics, Pain, Postural dysfunction, Obesity, Increased muscle spasms, Decreased mobility, Decreased activity tolerance, Decreased endurance,  Decreased strength, Increased edema, Difficulty walking, Decreased balance  Visit Diagnosis: Pain in right thigh  Other abnormalities of gait and mobility  Muscle weakness (generalized)     Problem List Patient Active Problem List   Diagnosis Date Noted  . Atrial fibrillation ([place])   . Varicose veins of lower extremities with other complications 06/00/4599  . Chronic venous insufficiency 12/17/2012  . Postop Hyponatremia 11/05/2011  . OA (osteoarthritis) of knee 11/03/2011   Ihor Austin, LPTA; CBIS [phone]  Aldona Lento 12/23/2017, 6:54 PM  [facility] [address] [place], [place], [address] Phone: [phone]   Fax:  [phone]  Name: Brooke Luna MRN: [id number] Date of Birth: Jun 03, 1945

## 2017-12-24 DIAGNOSIS — R5383 Other fatigue: Secondary | ICD-10-CM | POA: Diagnosis not present

## 2017-12-24 DIAGNOSIS — Z0000 Encounter for general adult medical examination without abnormal findings: Secondary | ICD-10-CM | POA: Diagnosis not present

## 2017-12-24 DIAGNOSIS — Z299 Encounter for prophylactic measures, unspecified: Secondary | ICD-10-CM | POA: Diagnosis not present

## 2017-12-24 DIAGNOSIS — Z79899 Other long term (current) drug therapy: Secondary | ICD-10-CM | POA: Diagnosis not present

## 2017-12-24 DIAGNOSIS — Z1211 Encounter for screening for malignant neoplasm of colon: Secondary | ICD-10-CM | POA: Diagnosis not present

## 2017-12-24 DIAGNOSIS — Z1331 Encounter for screening for depression: Secondary | ICD-10-CM | POA: Diagnosis not present

## 2017-12-24 DIAGNOSIS — E7800 Pure hypercholesterolemia, unspecified: Secondary | ICD-10-CM | POA: Diagnosis not present

## 2017-12-24 DIAGNOSIS — Z1339 Encounter for screening examination for other mental health and behavioral disorders: Secondary | ICD-10-CM | POA: Diagnosis not present

## 2017-12-24 DIAGNOSIS — Z7189 Other specified counseling: Secondary | ICD-10-CM | POA: Diagnosis not present

## 2017-12-24 DIAGNOSIS — E559 Vitamin D deficiency, unspecified: Secondary | ICD-10-CM | POA: Diagnosis not present

## 2017-12-24 DIAGNOSIS — Z6841 Body Mass Index (BMI) 40.0 and over, adult: Secondary | ICD-10-CM | POA: Diagnosis not present

## 2017-12-24 DIAGNOSIS — E039 Hypothyroidism, unspecified: Secondary | ICD-10-CM | POA: Diagnosis not present

## 2017-12-25 ENCOUNTER — Other Ambulatory Visit (HOSPITAL_COMMUNITY): Payer: Self-pay | Admitting: Orthopedic Surgery

## 2017-12-25 DIAGNOSIS — R2242 Localized swelling, mass and lump, left lower limb: Secondary | ICD-10-CM | POA: Diagnosis not present

## 2017-12-25 DIAGNOSIS — S76911D Strain of unspecified muscles, fascia and tendons at thigh level, right thigh, subsequent encounter: Secondary | ICD-10-CM | POA: Diagnosis not present

## 2017-12-25 DIAGNOSIS — M7989 Other specified soft tissue disorders: Secondary | ICD-10-CM

## 2017-12-29 ENCOUNTER — Telehealth (HOSPITAL_COMMUNITY): Payer: Self-pay | Admitting: Internal Medicine

## 2017-12-29 NOTE — Telephone Encounter (Signed)
12/29/17  pt called to cx said that her left leg was bothering her so bad she didn't think she would be able to do therapy

## 2017-12-30 ENCOUNTER — Ambulatory Visit (HOSPITAL_COMMUNITY)
Admission: RE | Admit: 2017-12-30 | Discharge: 2017-12-30 | Disposition: A | Discharge status: Home / Self Care | Payer: Medicare Other | Source: Ambulatory Visit | Attending: Orthopedic Surgery | Admitting: Orthopedic Surgery

## 2017-12-30 ENCOUNTER — Ambulatory Visit (HOSPITAL_COMMUNITY): Payer: Medicare Other

## 2017-12-30 DIAGNOSIS — R2242 Localized swelling, mass and lump, left lower limb: Secondary | ICD-10-CM | POA: Insufficient documentation

## 2017-12-30 DIAGNOSIS — M7989 Other specified soft tissue disorders: Secondary | ICD-10-CM

## 2017-12-30 DIAGNOSIS — R600 Localized edema: Secondary | ICD-10-CM | POA: Diagnosis not present

## 2018-01-04 ENCOUNTER — Other Ambulatory Visit: Payer: Self-pay

## 2018-01-04 ENCOUNTER — Encounter (HOSPITAL_COMMUNITY): Payer: Self-pay

## 2018-01-04 ENCOUNTER — Ambulatory Visit (HOSPITAL_COMMUNITY): Payer: Medicare Other | Attending: Orthopedic Surgery

## 2018-01-04 DIAGNOSIS — R2689 Other abnormalities of gait and mobility: Secondary | ICD-10-CM | POA: Insufficient documentation

## 2018-01-04 DIAGNOSIS — M6281 Muscle weakness (generalized): Secondary | ICD-10-CM | POA: Diagnosis not present

## 2018-01-04 DIAGNOSIS — M79651 Pain in right thigh: Secondary | ICD-10-CM | POA: Diagnosis not present

## 2018-01-04 NOTE — Therapy (Signed)
[place], [place], [address] Phone: [phone]   Fax:  [phone]  Physical Therapy Aquatic Treatment Discharge Summary  Patient Details  Name: Brooke Luna MRN: [id number] Date of Birth: 05-May-1945 Referring Provider: Nicholes Stairs, MD   Encounter Date: 01/04/2018  PHYSICAL THERAPY DISCHARGE SUMMARY  Visits from Start of Care: 10  Current functional level related to goals / functional outcomes: Patient participated in aquatic therapy session this date and was educated on advanced aquatic exercise program to continue strengthening independently. I reviewed her progress towards her goals and readiness to discharge from therapy. Patient reports feeling confident she can continue independently with tools provided by therapist. She demonstrated independence with achieving correct exercises form with aquatic exercise and required minimal cuing. Patient reports she is going to have a follow up with her physician regarding her Lt LE swelling and that this is the only thing bothering her at this time. She will be discharged from therapy after this session. Objective measures from 12/23/17 were included to show her improvements since evaluation.    Remaining deficits: See below.   Education / Equipment: Educated on readiness for discharge as discussed at last re-evaluation and provided instruction on pool exercises. Edcuated on aquatic HEP for patient to perform if she chooses to become a member at her local fitness facility.   Plan: Patient agrees to discharge.  Patient goals were partially met. Patient is being discharged due to meeting the stated rehab goals.  ?????      PT End of Session - 01/04/18 1648    Visit Number  8    Number of Visits  13    Date for PT Re-Evaluation  01/05/18    Authorization Type  [employer] Part A and B (secondary - [employer], tertiary - [place] healthcare) No auth  required, 60 visit limit PT/OT/SLP - requires med reveiw after 30th visit    Authorization Time Period  11/24/17-01/08/18    Authorization - Visit Number  8    Authorization - Number of Visits  60    PT Start Time  2707    PT Stop Time  1418    PT Time Calculation (min)  43 min    Activity Tolerance  Patient tolerated treatment well    Behavior During Therapy  WFL for tasks assessed/performed       Past Medical History:  Diagnosis Date  . Arthritis    pain and oa  right knee and pain in rt leg and foot--also pain left knee-but right is worse  . Atrial fibrillation ([place])   . Back pain    lumbar bulging disk  . Contact dermatitis    underside of left leg--always in the same spots--comes and goes  . Diverticulosis   . GERD (gastroesophageal reflux disease)    diet control  . Hemorrhoids     Past Surgical History:  Procedure Laterality Date  . CATARACT EXTRACTION W/PHACO Left 02/22/2015   Procedure: CATARACT EXTRACTION PHACO AND INTRAOCULAR LENS PLACEMENT LEFT EYE CDE=5.41;  Surgeon: Tonny Branch, MD;  Location: [facility];  Service: Ophthalmology;  Laterality: Left;  . CATARACT EXTRACTION W/PHACO Right 03/26/2015   Procedure: CATARACT EXTRACTION PHACO AND INTRAOCULAR LENS PLACEMENT (IOC);  Surgeon: Tonny Branch, MD;  Location: [facility];  Service: Ophthalmology;  Laterality: Right;  CDE:5.01  . CHOLECYSTECTOMY    . surgery for broken right elbow Right   . TOTAL KNEE ARTHROPLASTY  11/03/2011   Procedure: TOTAL  KNEE ARTHROPLASTY;  Surgeon: Gearlean Alf, MD;  Location: [facility];  Service: Orthopedics;  Laterality: Right;  . TUBAL LIGATION      There were no vitals filed for this visit.  Subjective Assessment - 01/04/18 1630    Subjective  Patient reports her Rt thigh/hip pain has resolved and she denies pain today. She reports her MD is pleased with her progress and that she discussed her Lt LE swelling with them. She states they did an ultrasound on her Lt veins and she is waiting for results  and to hear from the surgeon but she is not sure what the plan will be.     Pertinent History  fall ~ 6 weeks ago resulting in Rt radius fracture and torn/strained muscles of Rt hip/thigh    Limitations  Walking    How long can you sit comfortably?  sit as long as want (prefers legs to be elevated)    How long can you stand comfortably?  able to stand for 10 minutes prior need to sit (was about 10 minutes max)    How long can you walk comfortably?  able to walk about 10 minutes max (was about 10 minutes max)    Patient Stated Goals  to be able to walk more normally again without pain    Currently in Pain?  No/denies        Adult Aquatic Therapy - 01/04/18 1632      Aquatic Therapy Subjective   Subjective  Patient reports her Rt thigh/hip pain has resolved and she denies pain today. She reports her MD is pleased with her progress and that she discussed her Lt LE swelling with them. She states they did an ultrasound on her Lt veins and she is waiting for results and to hear from the surgeon but she is not sure what the plan will be.       Treatment   Gait   Forward walking, 3 laps. Side step along lane line, 2 RT. (full length of pool area this date)    Specific Exercises  Hip/Low Back    Hip/Low Back  Leg pull back with 2 HHA at wall: 1x 15 reps bil LE (8lbs on bil LE). Leg press with noodle: 1x 15 reps bil LE. Side kick with 1 HHA at wall, forward/abduction to extension to adduction, 1x 15 reps bil LE. "Saw" lumbar rotation with oblique activation, 1x 10 reps bil. Squat with heel raise combination, 1x 15 reps, 2 HHA. Noodle pendulum for bil LE, 1x 15 reps with knee bent. Single leg stretch, 2x10 reps, bil LE with 2 noodles for support under arms.        PT Education - 01/04/18 1646    Education Details  Educated on readiness for discharge as discussed at last re-evaluation and provided instruction on pool exercises. Edcuated on aquatic HEP for patient to perform if she chooses to become a  member at her local fitness facility.    Person(s) Educated  Patient    Methods  Explanation;Handout    Comprehension  Verbalized understanding       [facility] PT Assessment - 12/23/17 0001    Functional Tests   Functional tests  Single leg stance        Single Leg Stance   Comments  SLS Lt 3", Rt 4" max of 5   was 0" BLE       Posture/Postural Control   Posture/Postural Control  Postural limitations    Postural Limitations  Rounded Shoulders;Forward head;Increased thoracic kyphosis        ROM / Strength   AROM / PROM / Strength  Strength        Strength   Strength Assessment Site  Hip;Knee;Ankle    Right Hip Flexion  4-/5   was 4-   Right Hip Extension  4/5   was 4/5   Right Hip ABduction  4/5   was 4-/5   Left Hip Flexion  4-/5   was 4/5   Left Hip Extension  4/5   was 4/5   Left Hip ABduction  4+/5   was 4/5   Right/Left Knee  Right;Left    Right Knee Flexion  4+/5   was 4/5   Right Knee Extension  4+/5   was 4/5   Left Knee Flexion  4/5   was 4-/5   Left Knee Extension  4+/5   was 4-/5   Right Ankle Dorsiflexion  4+/5   was 4+   Left Ankle Dorsiflexion  4+/5   was 4+       Ambulation/Gait   Ambulation/Gait  Yes    Ambulation/Gait Assistance  7: Independent    Ambulation Distance (Feet)  500 Feet   2MWT without AD was 286 with SPC,    Assistive device  None    Gait Pattern  Step-through pattern;Decreased step length - right;Decreased stance time - right;Decreased stride length;Decreased weight shift to right;Trendelenburg;Antalgic    Ambulation Surface  Level;Indoor    Gait velocity  1.27 m/s   was .7 m/s       Standardized Balance Assessment   Standardized Balance Assessment  Timed Up and Go Test        Timed Up and Go Test   TUG  Normal TUG    Normal TUG (seconds)  9.52   was 15.03   TUG Comments  without AD, no HHA     PT Short Term Goals - 12/23/17 1444      PT SHORT TERM GOAL #1   Title  Patient  will be independent with HEP, updated PRN, to improve balance and LE strength as well as reduce pain and improve mobility.     Baseline  8/28:  reports compliance iwth some of the exercises daily    Status  Achieved      PT SHORT TERM GOAL #2   Title  Patient will improve MMT for limited groups by 1/2 grade to demosntrate improved functional LE strength for more normalized gait and mobility.    Baseline  8/28: see MMT    Status  On-going      PT SHORT TERM GOAL #3   Title  Patient will improve TUG by 3 seconds to perform in 12 seconds or less with LRAD and normal step length/width indicating reduced fall risk with gait.    Baseline  8/28: TUG 9.52" (was 15.03")    Status  Achieved        PT Long Term Goals - 12/23/17 1444      PT LONG TERM GOAL #1   Title  Patient will improve MMT for limited groups by 1 grade to demosntrate improved functional LE strength for more normalized gait and mobility.    Baseline  see MMT    Status  On-going      PT LONG TERM GOAL #2   Title  Patient will improve 5x sit to stand by 4 seconds to demonstrate significant improvement in functional LE strength.    Baseline  12/23/17: 5STS 10.78" (was 18.03")    Status  Achieved      PT LONG TERM GOAL #3   Title  Patient will perform 2MWT with LRAD and ambulate 100' farther than evaluation to demonstrate significant improvement in endurance and activity tolerance with gait to improve community access and reduce fall risk.    Baseline  12/23/17: 2MWT 5109f without AD ([facility]    Status  Achieved        Plan - 01/04/18 1648    Clinical Impression Statement  Patient participated in aquatic therapy session this date and was educated on advanced aquatic exercise program to continue strengthening independently. I reviewed her progress towards her goals and readiness to discharge from therapy. Patient reports feeling confident she can continue independently with tools provided by therapist. She  demonstrated independence with achieving correct exercises form with aquatic exercise and required minimal cuing. Patient reports she is going to have a follow up with her physician regarding her Lt LE swelling and that this is the only thing bothering her at this time. She will be discharged from therapy after this session. Objective measures from 12/23/17 were included to show her improvements since evaluation.     Rehab Potential  Good    PT Frequency  2x / week    PT Duration  6 weeks    PT Treatment/Interventions  Aquatic Therapy;ADLs/Self Care Home Management;Cryotherapy;Electrical Stimulation;Moist Heat;DME Instruction;Gait training;Stair training;Functional mobility training;Therapeutic activities;Therapeutic exercise;Balance training;Neuromuscular re-education;Patient/family education;Manual techniques;Passive range of motion    PT Next Visit Plan  discharge following this session.    PT Home Exercise Plan  11/26/17: bridge, clamshell. Aquatic HEP    Consulted and Agree with Plan of Care  Patient       Patient will benefit from skilled therapeutic intervention in order to improve the following deficits and impairments:  Abnormal gait, Increased fascial restricitons, Improper body mechanics, Pain, Postural dysfunction, Obesity, Increased muscle spasms, Decreased mobility, Decreased activity tolerance, Decreased endurance, Decreased strength, Increased edema, Difficulty walking, Decreased balance  Visit Diagnosis: Pain in right thigh  Other abnormalities of gait and mobility  Muscle weakness (generalized)     Problem List Patient Active Problem List   Diagnosis Date Noted  . [place]rial fibrillation ([place]   . Varicose veins of lower extremities with other complications 027/78/2423 . Chronic venous insufficiency 12/17/2012  . Postop Hyponatremia 11/05/2011  . OA (osteoarthritis) of knee 11/03/2011    RKipp Brood PT, DPT Physical Therap[facility] 01/04/2018 4:49 PM    [place] [place] [address]Phone: [phone]  Fax:  [phone] Name: BShayna EblenMRN: [id number] of Birth: [birth year]

## 2018-01-22 ENCOUNTER — Encounter: Payer: Self-pay | Admitting: Cardiology

## 2018-01-22 ENCOUNTER — Encounter

## 2018-01-22 ENCOUNTER — Encounter: Payer: Self-pay | Admitting: *Deleted

## 2018-01-22 ENCOUNTER — Ambulatory Visit (INDEPENDENT_AMBULATORY_CARE_PROVIDER_SITE_OTHER): Payer: Medicare Other | Admitting: Cardiology

## 2018-01-22 VITALS — BP 137/79 | HR 67 | Ht 65.0 in | Wt 263.4 lb

## 2018-01-22 DIAGNOSIS — I480 Paroxysmal atrial fibrillation: Secondary | ICD-10-CM | POA: Diagnosis not present

## 2018-01-22 DIAGNOSIS — I2510 Atherosclerotic heart disease of native coronary artery without angina pectoris: Secondary | ICD-10-CM | POA: Diagnosis not present

## 2018-01-22 DIAGNOSIS — I10 Essential (primary) hypertension: Secondary | ICD-10-CM

## 2018-01-22 MED ORDER — DILTIAZEM HCL 30 MG PO TABS: ORAL_TABLET | ORAL | 1 refills | Status: DC

## 2018-01-22 MED ORDER — DILTIAZEM HCL ER COATED BEADS 240 MG PO CP24: 240.0000 mg | ORAL_CAPSULE | Freq: Every day | ORAL | 1 refills | Status: DC

## 2018-01-22 NOTE — Patient Instructions (Signed)
Your physician wants you to follow-up in: [place] will receive a reminder letter in the mail two months in advance. If you don't receive a letter, please call our office to schedule the follow-up appointment.  Your physician has recommended you make the following change in your medication:   START DILTIAZEM SHORT ACTING 30 MG TAKE 1 TABLET EVERY 8 HOURS AS NEEDED FOR PALPITATIONS   Thank you for choosing [facility]!!

## 2018-01-22 NOTE — Progress Notes (Signed)
Clinical Summary Brooke Luna is a 72 y.o.female  seen today for follow up of the following medical problems.  1. PAF - diagnosed 12/2014 during hospital admission at [facility] - echo at that time per clinic notes 55-60%.  - CHADS2Vasc score 2, she was started on xarelto at that time. - followed in afib clinic., Continued rate control, from last clinic note if recurrence of afib could consider antiarrhythmics   - last visit in 09/2017 she was back in afib. We had arranged cardiversion, on preprocedure EKG she had converted back to NSR and procedure was cancelled - isolated episode since last visit. Overall doing well     2. HTN - have not taken meds yet today  3. CAD - nonobstructive disease by cath 2006according to prior clinic notes - admit 09/[facility] to [facility] with chest painprimarily related to palpitations.   Past Medical History:  Diagnosis Date  . Arthritis    pain and oa  right knee and pain in rt leg and foot--also pain left knee-but right is worse  . Atrial fibrillation ([place])   . Back pain    lumbar bulging disk  . Contact dermatitis    underside of left leg--always in the same spots--comes and goes  . Diverticulosis   . GERD (gastroesophageal reflux disease)    diet control  . Hemorrhoids      Allergies  Allergen Reactions  . Codeine Shortness Of Breath and Itching  . Meloxicam Other (See Comments)    Stomach iritation Stomach iritation     Current Outpatient Medications  Medication Sig Dispense Refill  . citalopram (CELEXA) 20 MG tablet Take 1 tablet by mouth daily.    Marland Kitchen diltiazem (CARDIZEM CD) 240 MG 24 hr capsule TAKE 1 CAPSULE BY MOUTH EVERY DAY 90 capsule 1  . fluticasone (FLONASE) 50 MCG/ACT nasal spray Place 1-2 sprays into both nostrils daily as needed for allergies.    . furosemide (LASIX) 20 MG tablet TAKE 1 TABLET BY MOUTH EVERY DAY 30 tablet 3  . HYDROcodone-acetaminophen (NORCO/VICODIN) 5-325 MG tablet Take 1 tablet by  mouth every 4 (four) hours as needed. 20 tablet 0  . methocarbamol (ROBAXIN) 500 MG tablet Take 500 mg by mouth 3 (three) times daily as needed for muscle spasms.    . rivaroxaban (XARELTO) 20 MG TABS tablet Take 20 mg by mouth daily with supper.     No current facility-administered medications for this visit.      Past Surgical History:  Procedure Laterality Date  . CATARACT EXTRACTION W/PHACO Left 02/22/2015   Procedure: CATARACT EXTRACTION PHACO AND INTRAOCULAR LENS PLACEMENT LEFT EYE CDE=5.41;  Surgeon: Tonny Chante Mayson, MD;  Location: [facility];  Service: Ophthalmology;  Laterality: Left;  . CATARACT EXTRACTION W/PHACO Right 03/26/2015   Procedure: CATARACT EXTRACTION PHACO AND INTRAOCULAR LENS PLACEMENT (IOC);  Surgeon: Tonny Janice Seales, MD;  Location: [facility];  Service: Ophthalmology;  Laterality: Right;  CDE:5.01  . CHOLECYSTECTOMY    . surgery for broken right elbow Right   . TOTAL KNEE ARTHROPLASTY  11/03/2011   Procedure: TOTAL KNEE ARTHROPLASTY;  Surgeon: Gearlean Alf, MD;  Location: [facility];  Service: Orthopedics;  Laterality: Right;  . TUBAL LIGATION       Allergies  Allergen Reactions  . Codeine Shortness Of Breath and Itching  . Meloxicam Other (See Comments)    Stomach iritation Stomach iritation      Family History  Problem Relation Age of Onset  . Diabetes Mother   .  Stroke Mother   . Stroke Father      Social History [name] reports that she quit smoking about 21 years ago. She started smoking about 60 years ago. She has a 38.00 pack-year smoking history. She has never used smokeless tobacco. [name] reports that she does not drink alcohol.   Review of Systems CONSTITUTIONAL: No weight loss, fever, chills, weakness or fatigue.  HEENT: Eyes: No visual loss, blurred vision, double vision or yellow sclerae.No hearing loss, sneezing, congestion, runny nose or sore throat.  SKIN: No rash or itching.  CARDIOVASCULAR: per hpi RESPIRATORY: No shortness of breath,  cough or sputum.  GASTROINTESTINAL: No anorexia, nausea, vomiting or diarrhea. No abdominal pain or blood.  GENITOURINARY: No burning on urination, no polyuria NEUROLOGICAL: No headache, dizziness, syncope, paralysis, ataxia, numbness or tingling in the extremities. No change in bowel or bladder control.  MUSCULOSKELETAL: No muscle, back pain, joint pain or stiffness.  LYMPHATICS: No enlarged nodes. No history of splenectomy.  PSYCHIATRIC: No history of depression or anxiety.  ENDOCRINOLOGIC: No reports of sweating, cold or heat intolerance. No polyuria or polydipsia.  Marland Kitchen   Physical Examination Vitals:   01/22/18 1250  BP: 137/79  Pulse: 67   Vitals:   01/22/18 1250  Weight: 263 lb 6.4 oz (119.5 kg)  Height: 5\' 5"  (1.651 m)    Gen: resting comfortably, no acute distress HEENT: no scleral icterus, pupils equal round and reactive, no palptable cervical adenopathy,  CV: RRR, no m/rg, no jvd Resp: Clear to auscultation bilaterally GI: abdomen is soft, non-tender, non-distended, normal bowel sounds, no hepatosplenomegaly MSK: extremities are warm, no edema.  Skin: warm, no rash Neuro:  no focal deficits Psych: appropriate affect   Diagnostic Studies 12/2016 sleep study IMPRESSIONS - Minimal obstructive sleep apnea occurred during this study (AHI = 5.5/h) occurring primarily during supine sleep. - Mild central sleep apnea occurred during this study (CAI = 5.5/h). - The patient had minimal or no oxygen desaturation during the study (Min O2 = 100.00%) - The patient snored with Loud snoring volume. - EKG findings include Atrial Fibrillation. - Mild periodic limb movements of sleep occurred during the study. Associated arousals were significant.  DIAGNOSIS - Obstructive Sleep Apnea (327.23 [G47.33 ICD-10])  RECOMMENDATIONS - Very mild obstructive sleep apnea occurring primarily in supine position. - Recommend avoiding sleeping supine.  - Avoid alcohol, sedatives and other CNS  depressants that may worsen sleep apnea and disrupt normal sleep architecture. - Sleep hygiene should be reviewed to assess factors that may improve sleep quality. - Weight management and regular exercise should be initiated or continued if appropriate. - Consider referral to ENT for evaluation of possible surgical causes of snoring.  11/2016 echo Study Conclusions  - Left ventricle: The cavity size was normal. Wall thickness was normal. Systolic function was normal. The estimated ejection fraction was in the range of 60% to 65%. Left ventricular diastolic function parameters were normal. - Left atrium: The atrium was mildly to moderately dilated.    Assessment and Plan  1. PAF -overall doing well, in general she does not tolerate even rate controlled afib well - continue current meds, if recurrent episodes would have her f/u back with afib clinic to consider antiarrhythmic options - continue anticoagulation. Will give Rx for dilt short acting 30mg  prn as needed, continue her daily long acting diltiazem - EKG today shows normal sinus rhythm   2. HTN - mildly elevated, has not taken meds yet today. COntinue to monitor.  3. CAD - history of nonobstructive CAD - no symptoms, continue to monitor.    F/u 6 months  Arnoldo Lenis, M.D

## 2018-02-15 ENCOUNTER — Other Ambulatory Visit: Payer: Self-pay | Admitting: Cardiology

## 2018-02-22 ENCOUNTER — Other Ambulatory Visit: Payer: Self-pay | Admitting: Cardiology

## 2018-02-22 MED ORDER — DILTIAZEM HCL 30 MG PO TABS: ORAL_TABLET | ORAL | 1 refills | Status: DC

## 2018-02-22 NOTE — Telephone Encounter (Signed)
dilt 30 mg refilled to [facility]

## 2018-03-17 ENCOUNTER — Other Ambulatory Visit: Payer: Self-pay | Admitting: Cardiology

## 2018-04-02 ENCOUNTER — Other Ambulatory Visit (HOSPITAL_COMMUNITY): Payer: Self-pay | Admitting: Cardiology

## 2018-04-05 DIAGNOSIS — I4891 Unspecified atrial fibrillation: Secondary | ICD-10-CM | POA: Diagnosis not present

## 2018-04-05 DIAGNOSIS — I10 Essential (primary) hypertension: Secondary | ICD-10-CM | POA: Diagnosis not present

## 2018-04-05 DIAGNOSIS — Z6841 Body Mass Index (BMI) 40.0 and over, adult: Secondary | ICD-10-CM | POA: Diagnosis not present

## 2018-04-05 DIAGNOSIS — Z87891 Personal history of nicotine dependence: Secondary | ICD-10-CM | POA: Diagnosis not present

## 2018-04-05 DIAGNOSIS — Z2821 Immunization not carried out because of patient refusal: Secondary | ICD-10-CM | POA: Diagnosis not present

## 2018-04-05 DIAGNOSIS — J069 Acute upper respiratory infection, unspecified: Secondary | ICD-10-CM | POA: Diagnosis not present

## 2018-04-06 ENCOUNTER — Ambulatory Visit: Payer: Medicare Other | Admitting: Cardiology

## 2018-10-08 ENCOUNTER — Other Ambulatory Visit: Payer: Self-pay | Admitting: Cardiology

## 2018-11-29 DIAGNOSIS — D2262 Melanocytic nevi of left upper limb, including shoulder: Secondary | ICD-10-CM | POA: Diagnosis not present

## 2018-11-29 DIAGNOSIS — D239 Other benign neoplasm of skin, unspecified: Secondary | ICD-10-CM | POA: Diagnosis not present

## 2018-11-29 DIAGNOSIS — L570 Actinic keratosis: Secondary | ICD-10-CM | POA: Diagnosis not present

## 2018-11-29 DIAGNOSIS — D485 Neoplasm of uncertain behavior of skin: Secondary | ICD-10-CM | POA: Diagnosis not present

## 2018-11-29 DIAGNOSIS — D2239 Melanocytic nevi of other parts of face: Secondary | ICD-10-CM | POA: Diagnosis not present

## 2018-11-29 DIAGNOSIS — L568 Other specified acute skin changes due to ultraviolet radiation: Secondary | ICD-10-CM | POA: Diagnosis not present

## 2018-12-30 DIAGNOSIS — E039 Hypothyroidism, unspecified: Secondary | ICD-10-CM | POA: Diagnosis not present

## 2018-12-30 DIAGNOSIS — R0602 Shortness of breath: Secondary | ICD-10-CM | POA: Diagnosis not present

## 2018-12-30 DIAGNOSIS — Z79899 Other long term (current) drug therapy: Secondary | ICD-10-CM | POA: Diagnosis not present

## 2018-12-30 DIAGNOSIS — E7800 Pure hypercholesterolemia, unspecified: Secondary | ICD-10-CM | POA: Diagnosis not present

## 2018-12-30 DIAGNOSIS — Z299 Encounter for prophylactic measures, unspecified: Secondary | ICD-10-CM | POA: Diagnosis not present

## 2018-12-30 DIAGNOSIS — Z1211 Encounter for screening for malignant neoplasm of colon: Secondary | ICD-10-CM | POA: Diagnosis not present

## 2018-12-30 DIAGNOSIS — Z7189 Other specified counseling: Secondary | ICD-10-CM | POA: Diagnosis not present

## 2018-12-30 DIAGNOSIS — Z1339 Encounter for screening examination for other mental health and behavioral disorders: Secondary | ICD-10-CM | POA: Diagnosis not present

## 2018-12-30 DIAGNOSIS — Z0000 Encounter for general adult medical examination without abnormal findings: Secondary | ICD-10-CM | POA: Diagnosis not present

## 2018-12-30 DIAGNOSIS — Z1331 Encounter for screening for depression: Secondary | ICD-10-CM | POA: Diagnosis not present

## 2018-12-30 DIAGNOSIS — E559 Vitamin D deficiency, unspecified: Secondary | ICD-10-CM | POA: Diagnosis not present

## 2018-12-30 DIAGNOSIS — I480 Paroxysmal atrial fibrillation: Secondary | ICD-10-CM | POA: Diagnosis not present

## 2018-12-30 DIAGNOSIS — R002 Palpitations: Secondary | ICD-10-CM | POA: Diagnosis not present

## 2018-12-30 DIAGNOSIS — R5383 Other fatigue: Secondary | ICD-10-CM | POA: Diagnosis not present

## 2019-01-07 DIAGNOSIS — R002 Palpitations: Secondary | ICD-10-CM | POA: Diagnosis not present

## 2019-01-17 DIAGNOSIS — E2839 Other primary ovarian failure: Secondary | ICD-10-CM | POA: Diagnosis not present

## 2019-01-17 DIAGNOSIS — R0601 Orthopnea: Secondary | ICD-10-CM | POA: Diagnosis not present

## 2019-01-27 DIAGNOSIS — R002 Palpitations: Secondary | ICD-10-CM | POA: Diagnosis not present

## 2019-01-31 DIAGNOSIS — I10 Essential (primary) hypertension: Secondary | ICD-10-CM | POA: Diagnosis not present

## 2019-01-31 DIAGNOSIS — Z6841 Body Mass Index (BMI) 40.0 and over, adult: Secondary | ICD-10-CM | POA: Diagnosis not present

## 2019-01-31 DIAGNOSIS — Z299 Encounter for prophylactic measures, unspecified: Secondary | ICD-10-CM | POA: Diagnosis not present

## 2019-01-31 DIAGNOSIS — R002 Palpitations: Secondary | ICD-10-CM | POA: Diagnosis not present

## 2019-01-31 DIAGNOSIS — I480 Paroxysmal atrial fibrillation: Secondary | ICD-10-CM | POA: Diagnosis not present

## 2019-02-17 DIAGNOSIS — Z471 Aftercare following joint replacement surgery: Secondary | ICD-10-CM | POA: Diagnosis not present

## 2019-02-17 DIAGNOSIS — Z96651 Presence of right artificial knee joint: Secondary | ICD-10-CM | POA: Diagnosis not present

## 2019-02-17 DIAGNOSIS — M25562 Pain in left knee: Secondary | ICD-10-CM | POA: Diagnosis not present

## 2019-02-17 DIAGNOSIS — M1712 Unilateral primary osteoarthritis, left knee: Secondary | ICD-10-CM | POA: Diagnosis not present

## 2019-02-21 ENCOUNTER — Telehealth: Payer: Self-pay | Admitting: Cardiology

## 2019-02-21 NOTE — Telephone Encounter (Signed)
Pt states she's having problems w/ her Afib

## 2019-02-22 NOTE — Telephone Encounter (Signed)
Scheduled pt with Afib clinic tomorrow at 10:30 - pt aware of entrance and parking code - pt will take prn dilt 4 times daily for now and appreciative

## 2019-02-22 NOTE — Telephone Encounter (Signed)
Zio monitor shows some afib, we need to get her back into afib clinic for f/u. Can take her prn dilt up to 4 times daily for now along with her daily diltiazem. Lets try for the earliest afib clinic appt available   Zandra Abts MD

## 2019-02-22 NOTE — Telephone Encounter (Signed)
Pt says since Wednesday night started with some chest pain off and on lasting only a few seconds at the time - denies chest pain since last Wednesday - also has been having headaches and weak/sleepy - BP today 118/69 says max HR was 175 - HR on averages has been 85-101 - thinks she is in afib - pt checked HR again while on the phone and HR was 135 then went to 65 - has upcoming appt 11/20 with Dr Harl Bowie - has been taking prn diltiazem 30 mg 8 hours for the last few days - recently had echo and zio monitor per pcp (scanned into Epic) - will forward to provider

## 2019-02-22 NOTE — Telephone Encounter (Signed)
Patient called [facility] stating that she has not received a call back.from yesterday.  States that she is still in A Fib and her chest feels tight.

## 2019-02-23 ENCOUNTER — Telehealth: Payer: Self-pay | Admitting: Pharmacist

## 2019-02-23 ENCOUNTER — Ambulatory Visit (HOSPITAL_COMMUNITY)
Admission: RE | Admit: 2019-02-23 | Discharge: 2019-02-23 | Disposition: A | Discharge status: Home / Self Care | Payer: Medicare Other | Source: Ambulatory Visit | Attending: Nurse Practitioner | Admitting: Nurse Practitioner

## 2019-02-23 ENCOUNTER — Encounter (HOSPITAL_COMMUNITY): Payer: Self-pay | Admitting: Nurse Practitioner

## 2019-02-23 ENCOUNTER — Other Ambulatory Visit: Payer: Self-pay

## 2019-02-23 VITALS — BP 132/78 | HR 106 | Ht 65.0 in | Wt 268.0 lb

## 2019-02-23 DIAGNOSIS — Z79899 Other long term (current) drug therapy: Secondary | ICD-10-CM | POA: Insufficient documentation

## 2019-02-23 DIAGNOSIS — I2510 Atherosclerotic heart disease of native coronary artery without angina pectoris: Secondary | ICD-10-CM | POA: Diagnosis not present

## 2019-02-23 DIAGNOSIS — Z7901 Long term (current) use of anticoagulants: Secondary | ICD-10-CM | POA: Diagnosis not present

## 2019-02-23 DIAGNOSIS — I4819 Other persistent atrial fibrillation: Secondary | ICD-10-CM

## 2019-02-23 DIAGNOSIS — E669 Obesity, unspecified: Secondary | ICD-10-CM | POA: Insufficient documentation

## 2019-02-23 DIAGNOSIS — M1711 Unilateral primary osteoarthritis, right knee: Secondary | ICD-10-CM | POA: Insufficient documentation

## 2019-02-23 DIAGNOSIS — Z96651 Presence of right artificial knee joint: Secondary | ICD-10-CM | POA: Insufficient documentation

## 2019-02-23 DIAGNOSIS — Z886 Allergy status to analgesic agent status: Secondary | ICD-10-CM | POA: Insufficient documentation

## 2019-02-23 DIAGNOSIS — Z9109 Other allergy status, other than to drugs and biological substances: Secondary | ICD-10-CM | POA: Diagnosis not present

## 2019-02-23 DIAGNOSIS — Z823 Family history of stroke: Secondary | ICD-10-CM | POA: Diagnosis not present

## 2019-02-23 DIAGNOSIS — I480 Paroxysmal atrial fibrillation: Secondary | ICD-10-CM | POA: Insufficient documentation

## 2019-02-23 DIAGNOSIS — Z833 Family history of diabetes mellitus: Secondary | ICD-10-CM | POA: Diagnosis not present

## 2019-02-23 DIAGNOSIS — Z885 Allergy status to narcotic agent status: Secondary | ICD-10-CM | POA: Insufficient documentation

## 2019-02-23 DIAGNOSIS — Z9049 Acquired absence of other specified parts of digestive tract: Secondary | ICD-10-CM | POA: Insufficient documentation

## 2019-02-23 DIAGNOSIS — Z87891 Personal history of nicotine dependence: Secondary | ICD-10-CM | POA: Insufficient documentation

## 2019-02-23 DIAGNOSIS — Z6841 Body Mass Index (BMI) 40.0 and over, adult: Secondary | ICD-10-CM | POA: Insufficient documentation

## 2019-02-23 DIAGNOSIS — R9431 Abnormal electrocardiogram [ECG] [EKG]: Secondary | ICD-10-CM | POA: Diagnosis not present

## 2019-02-23 NOTE — Progress Notes (Signed)
Primary Care Physician: Monico Blitz, MD Referring Physician: [facility] triage Cardiologist: Dr. Doran Durand Brooke Luna is a 73 y.o. female with a h/o HTN, CAD, obesity as well as  paroxsymal afib since 2016. She has been scheduled for a few cardioversions but always returns to [place] before the procedure.   She is on xarelto 20 mg qd for a chadsvasc score of at least 4.   She does not smoke or drink alcohol, minimum caffeine. She does snore. She states that she sleeps in a recliner because of her back and she doesn't breath well on her back.   She in in the afib clinic today as her afib burden has increased to the point that sh is now persistent x 3 weeks and wants options to explore how to mange going forward.   Today, she denies symptoms of palpitations, chest pain, shortness of breath, orthopnea, PND, lower extremity edema, dizziness, presyncope, syncope, or neurologic sequela.+ for fatigue , dyspnea, mild LLE. The patient is tolerating medications without difficulties and is otherwise without complaint today.   Past Medical History:  Diagnosis Date  . Arthritis    pain and oa  right knee and pain in rt leg and foot--also pain left knee-but right is worse  . Atrial fibrillation ([place])   . Back pain    lumbar bulging disk  . Contact dermatitis    underside of left leg--always in the same spots--comes and goes  . Diverticulosis   . GERD (gastroesophageal reflux disease)    diet control  . Hemorrhoids    Past Surgical History:  Procedure Laterality Date  . CATARACT EXTRACTION W/PHACO Left 02/22/2015   Procedure: CATARACT EXTRACTION PHACO AND INTRAOCULAR LENS PLACEMENT LEFT EYE CDE=5.41;  Surgeon: Tonny Branch, MD;  Location: [facility];  Service: Ophthalmology;  Laterality: Left;  . CATARACT EXTRACTION W/PHACO Right 03/26/2015   Procedure: CATARACT EXTRACTION PHACO AND INTRAOCULAR LENS PLACEMENT (IOC);  Surgeon: Tonny Branch, MD;  Location: [facility];  Service:  Ophthalmology;  Laterality: Right;  CDE:5.01  . CHOLECYSTECTOMY    . surgery for broken right elbow Right   . TOTAL KNEE ARTHROPLASTY  11/03/2011   Procedure: TOTAL KNEE ARTHROPLASTY;  Surgeon: Gearlean Alf, MD;  Location: [facility];  Service: Orthopedics;  Laterality: Right;  . TUBAL LIGATION      Current Outpatient Medications  Medication Sig Dispense Refill  . citalopram (CELEXA) 20 MG tablet Take 1 tablet by mouth daily.    . clonazePAM (KLONOPIN) 0.5 MG tablet clonazepam 0.5 mg tablet  TAKE 1 TABLET BY MOUTH TWICE A DAY AS NEEDED    . diltiazem (CARDIZEM CD) 240 MG 24 hr capsule TAKE 1 CAPSULE BY MOUTH EVERY DAY 90 capsule 3  . diltiazem (CARDIZEM) 30 MG tablet TAKE 1 TABLET EVERY 8 HOURS AS NEEDED FOR PALPITATIONS 90 tablet 1  . furosemide (LASIX) 20 MG tablet TAKE 1 TABLET BY MOUTH EVERY DAY 90 tablet 3  . rivaroxaban (XARELTO) 20 MG TABS tablet Take 20 mg by mouth daily with supper.     No current facility-administered medications for this encounter.     Allergies  Allergen Reactions  . Codeine Shortness Of Breath and Itching  . Meloxicam Other (See Comments)    Stomach iritation Stomach iritation  . Other     Social History   Socioeconomic History  . Marital status: Married    Spouse name: Not on file  . Number of children: Not on file  . Years  of education: Not on file  . Highest education level: Not on file  Occupational History  . Not on file  Social Needs  . Financial resource strain: Not on file  . Food insecurity    Worry: Not on file    Inability: Not on file  . Transportation needs    Medical: Not on file    Non-medical: Not on file  Tobacco Use  . Smoking status: Former Smoker    Packs/day: 1.00    Years: 38.00    Pack years: 38.00    Start date: 06/25/1957    Quit date: 04/28/1996    Years since quitting: 22.8  . Smokeless tobacco: Never Used  . Tobacco comment: quit smoking 1998  Substance and Sexual Activity  . Alcohol use: Never     Frequency: Never  . Drug use: No  . Sexual activity: Not on file  Lifestyle  . Physical activity    Days per week: Not on file    Minutes per session: Not on file  . Stress: Not on file  Relationships  . Social Herbalist on phone: Not on file    Gets together: Not on file    Attends religious service: Not on file    Active member of club or organization: Not on file    Attends meetings of clubs or organizations: Not on file    Relationship status: Not on file  . Intimate partner violence    Fear of current or ex partner: Not on file    Emotionally abused: Not on file    Physically abused: Not on file    Forced sexual activity: Not on file  Other Topics Concern  . Not on file  Social History Narrative  . Not on file    Family History  Problem Relation Age of Onset  . Diabetes Mother   . Stroke Mother   . Stroke Father     ROS- All systems are reviewed and negative except as per the HPI above  Physical Exam: Vitals:   02/23/19 1038  BP: 132/78  Pulse: (!) 106  Weight: 121.6 kg  Height: 5\' 5"  (1.651 m)   Wt Readings from Last 3 Encounters:  02/23/19 121.6 kg  01/22/18 119.5 kg  10/28/17 123.8 kg    Labs: Lab Results  Component Value Date   NA 140 10/11/2017   K 3.8 10/11/2017   CL 104 10/11/2017   CO2 28 10/11/2017   GLUCOSE 129 (H) 10/11/2017   BUN 9 10/11/2017   CREATININE 0.93 10/11/2017   CALCIUM 8.8 (L) 10/11/2017   Lab Results  Component Value Date   INR 0.95 10/23/2011   No results found for: CHOL, HDL, LDLCALC, TRIG   GEN- The patient is well appearing, alert and oriented x 3 today.   Head- normocephalic, atraumatic Eyes-  Sclera clear, conjunctiva pink Ears- hearing intact Oropharynx- clear Neck- supple, no JVP Lymph- no cervical lymphadenopathy Lungs- Clear to ausculation bilaterally, normal work of breathing Heart- regular rate and rhythm, no murmurs, rubs or gallops, PMI not laterally displaced GI- soft, NT, ND, + BS  Extremities- no clubbing, cyanosis, left lower extremity edema worse than right(1+/trace)  MS- no significant deformity or atrophy Skin- no rash or lesion Psych- euthymic mood, full affect Neuro- strength and sensation are intact  EKG- NSR at 106 bpm,  qrs int 84 ms, qtc 470 ms Epic records reviewed Echo- Study Conclusions 01/17/19- EF abnormal at 45-50%  Assessment and Plan: 1. Paroxysmal afib  Increase in burden over several weeks to the point of of being persistent x 3 weeks Discussed antiarrythmic's with pt I feel Multaq  is not an option with reduced EF Flecainide not an option for this as well as well as h/o CAD I feel that she is relatively  young for amiodarone with potential side effects That leaves tikosyn or sotalol  Pt does not know if she wants to commit to this option now She is also on celexa and with qtc prolonging potential with tikosyn may need to be weaned off if she decides on this option PharmD to review drugs Patient also wants to consider living in afib  Another solo dccv was offered without AAD's as well, but doubt that she will stay in rhythm for the long run  Continue xarelto for chadsvasc score of 4 Continue diltiazem 240 mg daily If she wants to live in afib she will need BB for additional rate control qtc today is 470 ms, a little long to start Tikosyn, possible will shorten if celexa is stopped    She will check on the price of tikosyn and get back to me on Monday   Butch Penny C. Lindberg Zenon, [facility] [address] [place], [place] [address] [phone]

## 2019-02-23 NOTE — Telephone Encounter (Signed)
Medication list reviewed in anticipation of upcoming Tikosyn initiation. Patient is taking one QTc prolonging medications.  Patient will need to talk with her doctor about an alternative to citalopram, which is contraindicated with Tikosyn due to prolonged QTc. Would consider switching to a SNRI like venlafaxine. This should be discussed early on as it may require a cross-titration.  Patient is anticoagulated on Xarelto on the appropriate dose. Please ensure that patient has not missed any anticoagulation doses in the 3 weeks prior to Tikosyn initiation.   Patient will need to be counseled to avoid use of Benadryl while on Tikosyn and in the 2-3 days prior to Tikosyn initiation.  Patient's K should be greater than or equal to 4 and Mg should be greater than or equal to 2 prior to starting Tikosyn especially since patient is on furosemide.

## 2019-02-28 ENCOUNTER — Telehealth (HOSPITAL_COMMUNITY): Payer: Self-pay | Admitting: *Deleted

## 2019-02-28 NOTE — Telephone Encounter (Signed)
Patient called in stating she has converted back into NSR in the 70s. BP 136/74. Feeling improved. At this point she would prefer more conservative approach if AF should return with standalone cardioversion before committing to AAD. Pt has follow up with Dr. Harl Bowie mid-November. Pt to call if further issues.

## 2019-03-18 ENCOUNTER — Encounter: Payer: Self-pay | Admitting: Cardiology

## 2019-03-18 ENCOUNTER — Telehealth (INDEPENDENT_AMBULATORY_CARE_PROVIDER_SITE_OTHER): Payer: Medicare Other | Admitting: Cardiology

## 2019-03-18 VITALS — BP 141/62 | HR 77 | Ht 64.0 in | Wt 265.0 lb

## 2019-03-18 DIAGNOSIS — I10 Essential (primary) hypertension: Secondary | ICD-10-CM

## 2019-03-18 DIAGNOSIS — I2510 Atherosclerotic heart disease of native coronary artery without angina pectoris: Secondary | ICD-10-CM | POA: Diagnosis not present

## 2019-03-18 DIAGNOSIS — I480 Paroxysmal atrial fibrillation: Secondary | ICD-10-CM | POA: Diagnosis not present

## 2019-03-18 NOTE — Patient Instructions (Signed)

## 2019-03-18 NOTE — Progress Notes (Signed)
Virtual Visit via Telephone Note   This visit type was conducted due to national recommendations for restrictions regarding the COVID-19 Pandemic (e.g. social distancing) in an effort to limit this patient's exposure and mitigate transmission in our community.  Due to her co-morbid illnesses, this patient is at least at moderate risk for complications without adequate follow up.  This format is felt to be most appropriate for this patient at this time.  The patient did not have access to video technology/had technical difficulties with video requiring transitioning to audio format only (telephone).  All issues noted in this document were discussed and addressed.  No physical exam could be performed with this format.  Please refer to the patient's chart for her  consent to telehealth for [facility].   Date:  03/18/2019   ID:  Brooke Luna, Brooke Luna [birth year], MRN [id number]  Patient Location: Home Provider Location: Office  PCP:  Monico Blitz, MD  Cardiologist:  Carlyle Dolly, MD  Electrophysiologist:  None   Evaluation Performed:  Follow-Up Visit  Chief Complaint:  Follow up  History of Present Illness:    Brooke Luna is a 73 y.o. female seen today for follow up of the following medical problems.  1. PAF - diagnosed 12/2014 during hospital admission at [facility] - echo at that time per clinic notes 55-60%.  - CHADS2Vasc score 2, she was started on xarelto at that time. - followed in afib clinic., Continued rate control, from last clinic note if recurrence of afib could consider antiarrhythmics   - last visit in 09/2017 she was back in afib. We had arranged cardiversion, on preprocedure EKG she had converted back to NSR and procedure was cancelled - isolated episode since last visit. Overall doing well   - followed in afib clinic. They would consider tiksasyn if recurrent afib.  - she is would actually favor an ablation and reports she has  communicated this to afib clinic - no recent palpitations - no bleeding on xarelto  2. HTN - home bp's typically 120s/60s   3. CAD - nonobstructive disease by cath 2006according to prior clinic notes - admit 09/[facility] to [facility] with chest painprimarily related to palpitations.  - no recent chest pain   The patient does not have symptoms concerning for COVID-19 infection (fever, chills, cough, or new shortness of breath).    Past Medical History:  Diagnosis Date  . Arthritis    pain and oa  right knee and pain in rt leg and foot--also pain left knee-but right is worse  . Atrial fibrillation ([place])   . Back pain    lumbar bulging disk  . Contact dermatitis    underside of left leg--always in the same spots--comes and goes  . Diverticulosis   . GERD (gastroesophageal reflux disease)    diet control  . Hemorrhoids    Past Surgical History:  Procedure Laterality Date  . CATARACT EXTRACTION W/PHACO Left 02/22/2015   Procedure: CATARACT EXTRACTION PHACO AND INTRAOCULAR LENS PLACEMENT LEFT EYE CDE=5.41;  Surgeon: Tonny Tavaras Goody, MD;  Location: [facility];  Service: Ophthalmology;  Laterality: Left;  . CATARACT EXTRACTION W/PHACO Right 03/26/2015   Procedure: CATARACT EXTRACTION PHACO AND INTRAOCULAR LENS PLACEMENT (IOC);  Surgeon: Tonny Adeline Petitfrere, MD;  Location: [facility];  Service: Ophthalmology;  Laterality: Right;  CDE:5.01  . CHOLECYSTECTOMY    . surgery for broken right elbow Right   . TOTAL KNEE ARTHROPLASTY  11/03/2011   Procedure: TOTAL KNEE ARTHROPLASTY;  Surgeon: Dione Plover  Aluisio, MD;  Location: [facility];  Service: Orthopedics;  Laterality: Right;  . TUBAL LIGATION       Current Meds  Medication Sig  . citalopram (CELEXA) 20 MG tablet Take 1 tablet by mouth daily.  . clonazePAM (KLONOPIN) 0.5 MG tablet clonazepam 0.5 mg tablet  TAKE 1 TABLET BY MOUTH TWICE A DAY AS NEEDED  . diltiazem (CARDIZEM CD) 240 MG 24 hr capsule TAKE 1 CAPSULE BY MOUTH EVERY DAY  . diltiazem (CARDIZEM) 30 MG  tablet TAKE 1 TABLET EVERY 8 HOURS AS NEEDED FOR PALPITATIONS  . furosemide (LASIX) 20 MG tablet TAKE 1 TABLET BY MOUTH EVERY DAY  . rivaroxaban (XARELTO) 20 MG TABS tablet Take 20 mg by mouth daily with supper.     Allergies:   Codeine, Meloxicam, and Other   Social History   Tobacco Use  . Smoking status: Former Smoker    Packs/day: 1.00    Years: 38.00    Pack years: 38.00    Start date: 06/25/1957    Quit date: 04/28/1996    Years since quitting: 22.9  . Smokeless tobacco: Never Used  . Tobacco comment: quit smoking 1998  Substance Use Topics  . Alcohol use: Never    Frequency: Never  . Drug use: No     Family Hx: The patient's family history includes Diabetes in her mother; Stroke in her father and mother.  ROS:   Please see the history of present illness.     All other systems reviewed and are negative.   Prior CV studies:   The following studies were reviewed today:  12/2016 sleep study IMPRESSIONS - Minimal obstructive sleep apnea occurred during this study (AHI = 5.5/h) occurring primarily during supine sleep. - Mild central sleep apnea occurred during this study (CAI = 5.5/h). - The patient had minimal or no oxygen desaturation during the study (Min O2 = 100.00%) - The patient snored with Loud snoring volume. - EKG findings include Atrial Fibrillation. - Mild periodic limb movements of sleep occurred during the study. Associated arousals were significant.  DIAGNOSIS - Obstructive Sleep Apnea (327.23 [G47.33 ICD-10])  RECOMMENDATIONS - Very mild obstructive sleep apnea occurring primarily in supine position. - Recommend avoiding sleeping supine.  - Avoid alcohol, sedatives and other CNS depressants that may worsen sleep apnea and disrupt normal sleep architecture. - Sleep hygiene should be reviewed to assess factors that may improve sleep quality. - Weight management and regular exercise should be initiated or continued if appropriate. - Consider referral  to ENT for evaluation of possible surgical causes of snoring.  11/2016 echo Study Conclusions  - Left ventricle: The cavity size was normal. Wall thickness was normal. Systolic function was normal. The estimated ejection fraction was in the range of 60% to 65%. Left ventricular diastolic function parameters were normal. - Left atrium: The atrium was mildly to moderately dilated.  Labs/Other Tests and Data Reviewed:    EKG:  No ECG reviewed.  Recent Labs: No results found for requested labs within last 8760 hours.   Recent Lipid Panel No results found for: CHOL, TRIG, HDL, CHOLHDL, LDLCALC, LDLDIRECT  Wt Readings from Last 3 Encounters:  03/18/19 265 lb (120.2 kg)  02/23/19 268 lb (121.6 kg)  01/22/18 263 lb 6.4 oz (119.5 kg)     Objective:    Vital Signs:  BP (!) 141/62   Pulse 77   Ht 5\' 4"  (1.626 m)   Wt 265 lb (120.2 kg)   BMI 45.49 kg/m  Normal affect. Normal speech pattern and tone. Comfortable, no apparent distress. No audible signs of SOB or wheezing.   ASSESSMENT & PLAN:    1. PAF -recently doing well without symptoms - followed in afib clinic. If recurrence would need to consider ablation vs tikasyn.   2. HTN - elevated today but overall home numbers are at goal, was at goal, at recent afib clinic visit. Continue current meds  3. CAD - history of nonobstructive CAD -no recent symptoms, continue current meds   F/u 6 months  COVID-19 Education: The signs and symptoms of COVID-19 were discussed with the patient and how to seek care for testing (follow up with PCP or arrange E-visit).  The importance of social distancing was discussed today.  Time:   Today, I have spent 14 minutes with the patient with telehealth technology discussing the above problems.     Medication Adjustments/Labs and Tests Ordered: Current medicines are reviewed at length with the patient today.  Concerns regarding medicines are outlined above.   Tests Ordered:  No orders of the defined types were placed in this encounter.   Medication Changes: No orders of the defined types were placed in this encounter.   Follow Up:  In Person in 6 month(s)  Signed, Carlyle Dolly, MD  03/18/2019 2:56 PM    [place]

## 2019-04-19 ENCOUNTER — Other Ambulatory Visit (HOSPITAL_COMMUNITY): Payer: Self-pay | Admitting: Cardiology

## 2019-05-03 ENCOUNTER — Other Ambulatory Visit: Payer: Self-pay

## 2019-05-03 ENCOUNTER — Ambulatory Visit (HOSPITAL_COMMUNITY)
Admission: RE | Admit: 2019-05-03 | Discharge: 2019-05-03 | Disposition: A | Discharge status: Home / Self Care | Payer: Medicare Other | Source: Ambulatory Visit | Attending: Nurse Practitioner | Admitting: Nurse Practitioner

## 2019-05-03 VITALS — BP 124/70 | HR 100 | Ht 64.0 in | Wt 262.0 lb

## 2019-05-03 DIAGNOSIS — I10 Essential (primary) hypertension: Secondary | ICD-10-CM | POA: Diagnosis not present

## 2019-05-03 DIAGNOSIS — Z79899 Other long term (current) drug therapy: Secondary | ICD-10-CM | POA: Diagnosis not present

## 2019-05-03 DIAGNOSIS — D6869 Other thrombophilia: Secondary | ICD-10-CM | POA: Diagnosis not present

## 2019-05-03 DIAGNOSIS — I4892 Unspecified atrial flutter: Secondary | ICD-10-CM | POA: Insufficient documentation

## 2019-05-03 DIAGNOSIS — Z87891 Personal history of nicotine dependence: Secondary | ICD-10-CM | POA: Insufficient documentation

## 2019-05-03 DIAGNOSIS — I2510 Atherosclerotic heart disease of native coronary artery without angina pectoris: Secondary | ICD-10-CM | POA: Insufficient documentation

## 2019-05-03 DIAGNOSIS — I480 Paroxysmal atrial fibrillation: Secondary | ICD-10-CM | POA: Diagnosis not present

## 2019-05-03 LAB — CBC
HCT: 45 % (ref 36.0–46.0)
Hemoglobin: 14.4 g/dL (ref 12.0–15.0)
MCH: 28.2 pg (ref 26.0–34.0)
MCHC: 32 g/dL (ref 30.0–36.0)
MCV: 88.2 fL (ref 80.0–100.0)
Platelets: 386 10*3/uL (ref 150–400)
RBC: 5.1 MIL/uL (ref 3.87–5.11)
RDW: 13.9 % (ref 11.5–15.5)
WBC: 12.5 10*3/uL — ABNORMAL HIGH (ref 4.0–10.5)
nRBC: 0 % (ref 0.0–0.2)

## 2019-05-03 LAB — BASIC METABOLIC PANEL
Anion gap: 11 (ref 5–15)
BUN: 9 mg/dL (ref 8–23)
CO2: 26 mmol/L (ref 22–32)
Calcium: 8.8 mg/dL — ABNORMAL LOW (ref 8.9–10.3)
Chloride: 103 mmol/L (ref 98–111)
Creatinine, Ser: 0.84 mg/dL (ref 0.44–1.00)
GFR calc Af Amer: >60 mL/min (ref >60)
GFR calc non Af Amer: >60 mL/min (ref >60)
Glucose, Bld: 98 mg/dL (ref 70–99)
Potassium: 4.1 mmol/L (ref 3.5–5.1)
Sodium: 140 mmol/L (ref 135–145)

## 2019-05-03 NOTE — Patient Instructions (Addendum)
Cardioversion scheduled for January. 11th 2021  - Arrive at the [facility] and go to admitting at 10:00am  -Do not eat or drink anything after midnight the night prior to your procedure.  - Take all your morning medications with a sip of water prior to arrival.  - You will not be able to drive home after your procedure.

## 2019-05-04 ENCOUNTER — Encounter (HOSPITAL_COMMUNITY): Payer: Self-pay | Admitting: Nurse Practitioner

## 2019-05-04 ENCOUNTER — Other Ambulatory Visit: Payer: Self-pay

## 2019-05-04 NOTE — Progress Notes (Addendum)
Primary Care Physician: Monico Blitz, MD Referring Physician: [facility] triage Cardiologist: Dr. Doran Durand Brooke Luna is a 74 y.o. female with a h/o HTN, CAD, obesity as well as  paroxsymal afib since 2016. She has been scheduled for a few cardioversions but always returns to [place] before the procedure.   She is on xarelto 20 mg qd for a chadsvasc score of at least 4.   She does not smoke or drink alcohol, minimum caffeine. She does snore. She states that she sleeps in a recliner because of her back and she doesn't breath well on her back.   She in in the afib clinic today as her afib burden has increased to the point that sh is now persistent x 3 weeks and wants options to explore how to mange going forward. AAD's were discussed but pt did not want to purse.  F/u in afib clinic, 05/03/19. She called to be seen for afib for the last 4-5 days. She has increased shortness of breath, and fatigue. She  would want an ablation. This would not be by guidelines to use AAD's first. She would not be an candidate for flecainide or Multaq  because of reduced  EF. She does not like the SE profile of amiodarone and Celexa may cause an issue with  qtc with Tikosyn or  Sotalol. She is not very willing to consider changing Celexa.  For the time being I will schedule cardioversion. She would like to skip antiarrythmic's and be considered for ablation. I will get her an appointment with EP after cardioversion. She is using 30 mg Cardizem every 4 hours to control v rates. She continues on xarelto for CHA2DS2VASc score of 4.   Today, she denies symptoms of palpitations, chest pain, shortness of breath, orthopnea, PND, lower extremity edema, dizziness, presyncope, syncope, or neurologic sequela.+ for fatigue , dyspnea, mild LLE. The patient is tolerating medications without difficulties and is otherwise without complaint today.   Past Medical History:  Diagnosis Date  . Arthritis    pain and oa   right knee and pain in rt leg and foot--also pain left knee-but right is worse  . Atrial fibrillation ([place])   . Back pain    lumbar bulging disk  . Contact dermatitis    underside of left leg--always in the same spots--comes and goes  . Diverticulosis   . GERD (gastroesophageal reflux disease)    diet control  . Hemorrhoids    Past Surgical History:  Procedure Laterality Date  . CATARACT EXTRACTION W/PHACO Left 02/22/2015   Procedure: CATARACT EXTRACTION PHACO AND INTRAOCULAR LENS PLACEMENT LEFT EYE CDE=5.41;  Surgeon: Tonny Branch, MD;  Location: [facility];  Service: Ophthalmology;  Laterality: Left;  . CATARACT EXTRACTION W/PHACO Right 03/26/2015   Procedure: CATARACT EXTRACTION PHACO AND INTRAOCULAR LENS PLACEMENT (IOC);  Surgeon: Tonny Branch, MD;  Location: [facility];  Service: Ophthalmology;  Laterality: Right;  CDE:5.01  . CHOLECYSTECTOMY    . surgery for broken right elbow Right   . TOTAL KNEE ARTHROPLASTY  11/03/2011   Procedure: TOTAL KNEE ARTHROPLASTY;  Surgeon: Gearlean Alf, MD;  Location: [facility];  Service: Orthopedics;  Laterality: Right;  . TUBAL LIGATION      Current Outpatient Medications  Medication Sig Dispense Refill  . citalopram (CELEXA) 20 MG tablet Take 1 tablet by mouth daily.    . clonazePAM (KLONOPIN) 0.5 MG tablet clonazepam 0.5 mg tablet  TAKE 1 TABLET BY MOUTH TWICE A DAY AS NEEDED    .  diltiazem (CARDIZEM CD) 240 MG 24 hr capsule TAKE 1 CAPSULE BY MOUTH EVERY DAY 90 capsule 3  . diltiazem (CARDIZEM) 30 MG tablet TAKE 1 TABLET EVERY 8 HOURS AS NEEDED FOR PALPITATIONS (Patient taking differently: TAKE 1 TABLET EVERY 4 HOURS AS NEEDED FOR PALPITATIONS) 90 tablet 1  . furosemide (LASIX) 20 MG tablet TAKE 1 TABLET BY MOUTH EVERY DAY 90 tablet 3  . rivaroxaban (XARELTO) 20 MG TABS tablet Take 20 mg by mouth daily with supper.     No current facility-administered medications for this encounter.    Allergies  Allergen Reactions  . Codeine Shortness Of Breath and  Itching  . Meloxicam Other (See Comments)    Stomach iritation Stomach iritation  . Other     Social History   Socioeconomic History  . Marital status: Married    Spouse name: Not on file  . Number of children: Not on file  . Years of education: Not on file  . Highest education level: Not on file  Occupational History  . Not on file  Tobacco Use  . Smoking status: Former Smoker    Packs/day: 1.00    Years: 38.00    Pack years: 38.00    Start date: 06/25/1957    Quit date: 04/28/1996    Years since quitting: 23.0  . Smokeless tobacco: Never Used  . Tobacco comment: quit smoking 1998  Substance and Sexual Activity  . Alcohol use: Never  . Drug use: No  . Sexual activity: Not on file  Other Topics Concern  . Not on file  Social History Narrative  . Not on file   Social Determinants of Health   Financial Resource Strain:   . Difficulty of Paying Living Expenses: Not on file  Food Insecurity:   . Worried About Charity fundraiser in the Last Year: Not on file  . Ran Out of Food in the Last Year: Not on file  Transportation Needs:   . Lack of Transportation (Medical): Not on file  . Lack of Transportation (Non-Medical): Not on file  Physical Activity:   . Days of Exercise per Week: Not on file  . Minutes of Exercise per Session: Not on file  Stress:   . Feeling of Stress : Not on file  Social Connections:   . Frequency of Communication with Friends and Family: Not on file  . Frequency of Social Gatherings with Friends and Family: Not on file  . Attends Religious Services: Not on file  . Active Member of Clubs or Organizations: Not on file  . Attends Archivist Meetings: Not on file  . Marital Status: Not on file  Intimate Partner Violence:   . Fear of Current or Ex-Partner: Not on file  . Emotionally Abused: Not on file  . Physically Abused: Not on file  . Sexually Abused: Not on file    Family History  Problem Relation Age of Onset  . Diabetes  Mother   . Stroke Mother   . Stroke Father     ROS- All systems are reviewed and negative except as per the HPI above  Physical Exam: Vitals:   05/03/19 1423  BP: 124/70  Pulse: 100  SpO2: 94%  Weight: 118.8 kg  Height: 5\' 4"  (1.626 m)   Wt Readings from Last 3 Encounters:  05/03/19 118.8 kg  03/18/19 120.2 kg  02/23/19 121.6 kg    Labs: Lab Results  Component Value Date   NA 140 05/03/2019   K  4.1 05/03/2019   CL 103 05/03/2019   CO2 26 05/03/2019   GLUCOSE 98 05/03/2019   BUN 9 05/03/2019   CREATININE 0.84 05/03/2019   CALCIUM 8.8 (L) 05/03/2019   Lab Results  Component Value Date   INR 0.95 10/23/2011   No results found for: CHOL, HDL, LDLCALC, TRIG   GEN- The patient is well appearing, alert and oriented x 3 today.   Head- normocephalic, atraumatic Eyes-  Sclera clear, conjunctiva pink Ears- hearing intact Oropharynx- clear Neck- supple, no JVP Lymph- no cervical lymphadenopathy Lungs- Clear to ausculation bilaterally, normal work of breathing Heart- irregular rate and rhythm, no murmurs, rubs or gallops, PMI not laterally displaced GI- soft, NT, ND, + BS Extremities- no clubbing, cyanosis, left lower extremity edema worse than right(1+/trace)  MS- no significant deformity or atrophy Skin- no rash or lesion Psych- euthymic mood, full affect Neuro- strength and sensation are intact  EKG- afib at 100 bpm, qrs int 88 ms, qtc 477 ms Epic records reviewed Echo- Study Conclusions 01/17/19- EF abnormal at 45-50%, LA chamber is 43 cm     Assessment and Plan: 1. Paroxysmal afib  Back in afib x 4 days  Discussed antiarrythmic's with pt I feel Multaq  is not an option with reduced EF Flecainide not an option for this as well as well as h/o CAD I feel that she is relatively  young for amiodarone with potential side effects That leaves tikosyn or sotalol  Pt does not know if she wants to commit to this option   She is also on celexa and with qtc  prolonging potential with tikosyn may need to be weaned off if she decides on this option She prefers not to be weaned off as her mental status is stable Patient also wants to  be considerd for front line ablation I will request appointment but I tried to set expectations that she may not be considered a candidate, weight is also contributing to her not  being and excellent candidate  In the interim we will set up for cardioversion Continue xarelto for chadsvasc score of 4 Continue diltiazem 240 mg daily and 30 mg as needed to control v rates  Cbc/bmet tody F/u here in afib clinic one week after  cardioversion   Butch Penny C. Eugen Jeansonne, [facility] [address] [place], [place] [address] [phone]

## 2019-05-04 NOTE — H&P (View-Only) (Signed)
Primary Care Physician: Monico Blitz, MD Referring Physician: [facility] triage Cardiologist: Dr. Doran Durand Mazzie Brooke Luna is a 74 y.o. female with a h/o HTN, CAD, obesity as well as  paroxsymal afib since 2016. She has been scheduled for a few cardioversions but always returns to [place] before the procedure.   She is on xarelto 20 mg qd for a chadsvasc score of at least 4.   She does not smoke or drink alcohol, minimum caffeine. She does snore. She states that she sleeps in a recliner because of her back and she doesn't breath well on her back.   She in in the afib clinic today as her afib burden has increased to the point that sh is now persistent x 3 weeks and wants options to explore how to mange going forward. AAD's were discussed but pt did not want to purse.  F/u in afib clinic, 05/03/19. She called to be seen for afib for the last 4-5 days. She has increased shortness of breath, and fatigue. She  would want an ablation. This would not be by guidelines to use AAD's first. She would not be an candidate for flecainide or Multaq  because of reduced  EF. She does not like the SE profile of amiodarone and Celexa may cause an issue with  qtc with Tikosyn or  Sotalol. She is not very willing to consider changing Celexa.  For the time being I will schedule cardioversion. She would like to skip antiarrythmic's and be considered for ablation. I will get her an appointment with EP after cardioversion. She is using 30 mg Cardizem every 4 hours to control v rates. She continues on xarelto for CHA2DS2VASc score of 4.   Today, she denies symptoms of palpitations, chest pain, shortness of breath, orthopnea, PND, lower extremity edema, dizziness, presyncope, syncope, or neurologic sequela.+ for fatigue , dyspnea, mild LLE. The patient is tolerating medications without difficulties and is otherwise without complaint today.   Past Medical History:  Diagnosis Date  . Arthritis    pain and oa   right knee and pain in rt leg and foot--also pain left knee-but right is worse  . Atrial fibrillation ([place])   . Back pain    lumbar bulging disk  . Contact dermatitis    underside of left leg--always in the same spots--comes and goes  . Diverticulosis   . GERD (gastroesophageal reflux disease)    diet control  . Hemorrhoids    Past Surgical History:  Procedure Laterality Date  . CATARACT EXTRACTION W/PHACO Left 02/22/2015   Procedure: CATARACT EXTRACTION PHACO AND INTRAOCULAR LENS PLACEMENT LEFT EYE CDE=5.41;  Surgeon: Tonny Branch, MD;  Location: [facility];  Service: Ophthalmology;  Laterality: Left;  . CATARACT EXTRACTION W/PHACO Right 03/26/2015   Procedure: CATARACT EXTRACTION PHACO AND INTRAOCULAR LENS PLACEMENT (IOC);  Surgeon: Tonny Branch, MD;  Location: [facility];  Service: Ophthalmology;  Laterality: Right;  CDE:5.01  . CHOLECYSTECTOMY    . surgery for broken right elbow Right   . TOTAL KNEE ARTHROPLASTY  11/03/2011   Procedure: TOTAL KNEE ARTHROPLASTY;  Surgeon: Gearlean Alf, MD;  Location: [facility];  Service: Orthopedics;  Laterality: Right;  . TUBAL LIGATION      Current Outpatient Medications  Medication Sig Dispense Refill  . citalopram (CELEXA) 20 MG tablet Take 1 tablet by mouth daily.    . clonazePAM (KLONOPIN) 0.5 MG tablet clonazepam 0.5 mg tablet  TAKE 1 TABLET BY MOUTH TWICE A DAY AS NEEDED    .  diltiazem (CARDIZEM CD) 240 MG 24 hr capsule TAKE 1 CAPSULE BY MOUTH EVERY DAY 90 capsule 3  . diltiazem (CARDIZEM) 30 MG tablet TAKE 1 TABLET EVERY 8 HOURS AS NEEDED FOR PALPITATIONS (Patient taking differently: TAKE 1 TABLET EVERY 4 HOURS AS NEEDED FOR PALPITATIONS) 90 tablet 1  . furosemide (LASIX) 20 MG tablet TAKE 1 TABLET BY MOUTH EVERY DAY 90 tablet 3  . rivaroxaban (XARELTO) 20 MG TABS tablet Take 20 mg by mouth daily with supper.     No current facility-administered medications for this encounter.    Allergies  Allergen Reactions  . Codeine Shortness Of Breath and  Itching  . Meloxicam Other (See Comments)    Stomach iritation Stomach iritation  . Other     Social History   Socioeconomic History  . Marital status: Married    Spouse name: Not on file  . Number of children: Not on file  . Years of education: Not on file  . Highest education level: Not on file  Occupational History  . Not on file  Tobacco Use  . Smoking status: Former Smoker    Packs/day: 1.00    Years: 38.00    Pack years: 38.00    Start date: 06/25/1957    Quit date: 04/28/1996    Years since quitting: 23.0  . Smokeless tobacco: Never Used  . Tobacco comment: quit smoking 1998  Substance and Sexual Activity  . Alcohol use: Never  . Drug use: No  . Sexual activity: Not on file  Other Topics Concern  . Not on file  Social History Narrative  . Not on file   Social Determinants of Health   Financial Resource Strain:   . Difficulty of Paying Living Expenses: Not on file  Food Insecurity:   . Worried About Charity fundraiser in the Last Year: Not on file  . Ran Out of Food in the Last Year: Not on file  Transportation Needs:   . Lack of Transportation (Medical): Not on file  . Lack of Transportation (Non-Medical): Not on file  Physical Activity:   . Days of Exercise per Week: Not on file  . Minutes of Exercise per Session: Not on file  Stress:   . Feeling of Stress : Not on file  Social Connections:   . Frequency of Communication with Friends and Family: Not on file  . Frequency of Social Gatherings with Friends and Family: Not on file  . Attends Religious Services: Not on file  . Active Member of Clubs or Organizations: Not on file  . Attends Archivist Meetings: Not on file  . Marital Status: Not on file  Intimate Partner Violence:   . Fear of Current or Ex-Partner: Not on file  . Emotionally Abused: Not on file  . Physically Abused: Not on file  . Sexually Abused: Not on file    Family History  Problem Relation Age of Onset  . Diabetes  Mother   . Stroke Mother   . Stroke Father     ROS- All systems are reviewed and negative except as per the HPI above  Physical Exam: Vitals:   05/03/19 1423  BP: 124/70  Pulse: 100  SpO2: 94%  Weight: 118.8 kg  Height: 5\' 4"  (1.626 m)   Wt Readings from Last 3 Encounters:  05/03/19 118.8 kg  03/18/19 120.2 kg  02/23/19 121.6 kg    Labs: Lab Results  Component Value Date   NA 140 05/03/2019   K  4.1 05/03/2019   CL 103 05/03/2019   CO2 26 05/03/2019   GLUCOSE 98 05/03/2019   BUN 9 05/03/2019   CREATININE 0.84 05/03/2019   CALCIUM 8.8 (L) 05/03/2019   Lab Results  Component Value Date   INR 0.95 10/23/2011   No results found for: CHOL, HDL, LDLCALC, TRIG   GEN- The patient is well appearing, alert and oriented x 3 today.   Head- normocephalic, atraumatic Eyes-  Sclera clear, conjunctiva pink Ears- hearing intact Oropharynx- clear Neck- supple, no JVP Lymph- no cervical lymphadenopathy Lungs- Clear to ausculation bilaterally, normal work of breathing Heart- irregular rate and rhythm, no murmurs, rubs or gallops, PMI not laterally displaced GI- soft, NT, ND, + BS Extremities- no clubbing, cyanosis, left lower extremity edema worse than right(1+/trace)  MS- no significant deformity or atrophy Skin- no rash or lesion Psych- euthymic mood, full affect Neuro- strength and sensation are intact  EKG- afib at 100 bpm, qrs int 88 ms, qtc 477 ms Epic records reviewed Echo- Study Conclusions 01/17/19- EF abnormal at 45-50%, LA chamber is 43 cm     Assessment and Plan: 1. Paroxysmal afib  Back in afib x 4 days  Discussed antiarrythmic's with pt I feel Multaq  is not an option with reduced EF Flecainide not an option for this as well as well as h/o CAD I feel that she is relatively  young for amiodarone with potential side effects That leaves tikosyn or sotalol  Pt does not know if she wants to commit to this option   She is also on celexa and with qtc  prolonging potential with tikosyn may need to be weaned off if she decides on this option She prefers not to be weaned off as her mental status is stable Patient also wants to  be considerd for front line ablation I will request appointment but I tried to set expectations that she may not be considered a candidate, weight is also contributing to her not  being and excellent candidate  In the interim we will set up for cardioversion Continue xarelto for chadsvasc score of 4 Continue diltiazem 240 mg daily and 30 mg as needed to control v rates  Cbc/bmet tody F/u here in afib clinic one week after ablation   Brooke Luna, [facility] [address] [place], [place] [address] [phone]

## 2019-05-05 ENCOUNTER — Other Ambulatory Visit (HOSPITAL_COMMUNITY)
Admission: RE | Admit: 2019-05-05 | Discharge: 2019-05-05 | Disposition: A | Discharge status: Home / Self Care | Payer: Medicare Other | Source: Ambulatory Visit | Attending: Cardiology | Admitting: Cardiology

## 2019-05-05 ENCOUNTER — Other Ambulatory Visit: Payer: Self-pay

## 2019-05-05 DIAGNOSIS — Z01812 Encounter for preprocedural laboratory examination: Secondary | ICD-10-CM | POA: Diagnosis not present

## 2019-05-05 DIAGNOSIS — Z20822 Contact with and (suspected) exposure to covid-19: Secondary | ICD-10-CM | POA: Diagnosis not present

## 2019-05-05 LAB — SARS CORONAVIRUS 2 (TAT 6-24 HRS): SARS Coronavirus 2: NEGATIVE

## 2019-05-06 NOTE — Progress Notes (Signed)
Pre-op call done, patient states she will remain quarantine over weekend. Verified has not missed any doses of Xarelto and will take it evening before arrival to hospital. All questions addressed. Patient to arrive by 1000 05/09/19.

## 2019-05-09 ENCOUNTER — Other Ambulatory Visit: Payer: Self-pay

## 2019-05-09 ENCOUNTER — Ambulatory Visit (HOSPITAL_COMMUNITY)
Admission: RE | Admit: 2019-05-09 | Discharge: 2019-05-09 | Disposition: A | Discharge status: Home / Self Care | Payer: Medicare Other | Attending: Cardiology | Admitting: Cardiology

## 2019-05-09 ENCOUNTER — Ambulatory Visit (HOSPITAL_COMMUNITY)
Admission: RE | Admit: 2019-05-09 | Discharge: 2019-05-09 | Disposition: A | Discharge status: Home / Self Care | Payer: Medicare Other | Source: Ambulatory Visit | Attending: Nurse Practitioner | Admitting: Nurse Practitioner

## 2019-05-09 ENCOUNTER — Encounter (HOSPITAL_COMMUNITY)
Admission: RE | Disposition: A | Discharge status: Home / Self Care | Payer: Self-pay | Source: Home / Self Care | Attending: Cardiology

## 2019-05-09 ENCOUNTER — Ambulatory Visit (HOSPITAL_COMMUNITY): Payer: Medicare Other | Admitting: Certified Registered Nurse Anesthetist

## 2019-05-09 ENCOUNTER — Encounter (HOSPITAL_COMMUNITY): Payer: Self-pay | Admitting: Cardiology

## 2019-05-09 DIAGNOSIS — I2510 Atherosclerotic heart disease of native coronary artery without angina pectoris: Secondary | ICD-10-CM | POA: Diagnosis not present

## 2019-05-09 DIAGNOSIS — K219 Gastro-esophageal reflux disease without esophagitis: Secondary | ICD-10-CM | POA: Diagnosis not present

## 2019-05-09 DIAGNOSIS — Z7901 Long term (current) use of anticoagulants: Secondary | ICD-10-CM | POA: Diagnosis not present

## 2019-05-09 DIAGNOSIS — I83899 Varicose veins of unspecified lower extremities with other complications: Secondary | ICD-10-CM | POA: Diagnosis not present

## 2019-05-09 DIAGNOSIS — I10 Essential (primary) hypertension: Secondary | ICD-10-CM | POA: Diagnosis not present

## 2019-05-09 DIAGNOSIS — I480 Paroxysmal atrial fibrillation: Secondary | ICD-10-CM

## 2019-05-09 DIAGNOSIS — Z87891 Personal history of nicotine dependence: Secondary | ICD-10-CM | POA: Diagnosis not present

## 2019-05-09 DIAGNOSIS — E669 Obesity, unspecified: Secondary | ICD-10-CM | POA: Diagnosis not present

## 2019-05-09 DIAGNOSIS — Z79899 Other long term (current) drug therapy: Secondary | ICD-10-CM | POA: Diagnosis not present

## 2019-05-09 DIAGNOSIS — M179 Osteoarthritis of knee, unspecified: Secondary | ICD-10-CM | POA: Diagnosis not present

## 2019-05-09 DIAGNOSIS — I4891 Unspecified atrial fibrillation: Secondary | ICD-10-CM | POA: Insufficient documentation

## 2019-05-09 HISTORY — PX: CARDIOVERSION: SHX1299

## 2019-05-09 SURGERY — CARDIOVERSION
Anesthesia: General

## 2019-05-09 MED ORDER — SODIUM CHLORIDE 0.9 % IV SOLN: INTRAVENOUS | Status: DC

## 2019-05-09 MED ORDER — LIDOCAINE 2% (20 MG/ML) 5 ML SYRINGE
INTRAMUSCULAR | Status: DC | PRN
  Administered 2019-05-09: 60 mg via INTRAVENOUS

## 2019-05-09 MED ORDER — PROPOFOL 10 MG/ML IV BOLUS
INTRAVENOUS | Status: DC | PRN
  Administered 2019-05-09: 70 mg via INTRAVENOUS
  Administered 2019-05-09: 30 mg via INTRAVENOUS

## 2019-05-09 NOTE — Anesthesia Procedure Notes (Signed)
Procedure Name: General with mask airway Date/Time: 05/09/2019 10:19 AM Performed by: Janace Litten, CRNA Pre-anesthesia Checklist: Patient identified, Emergency Drugs available, Suction available and Patient being monitored Patient Re-evaluated:Patient Re-evaluated prior to induction Oxygen Delivery Method: Ambu bag Preoxygenation: Pre-oxygenation with 100% oxygen Induction Type: IV induction

## 2019-05-09 NOTE — Anesthesia Preprocedure Evaluation (Signed)
Anesthesia Evaluation  Patient identified by MRN, date of birth, ID band Patient awake    Reviewed: Allergy & Precautions, H&P , NPO status , Patient's Chart, lab work & pertinent test results  Airway Mallampati: II   Neck ROM: full    Dental   Pulmonary former smoker,    breath sounds clear to auscultation       Cardiovascular + dysrhythmias Atrial Fibrillation  Rhythm:irregular Rate:Normal     Neuro/Psych    GI/Hepatic GERD  ,  Endo/Other    Renal/GU      Musculoskeletal  (+) Arthritis ,   Abdominal   Peds  Hematology   Anesthesia Other Findings   Reproductive/Obstetrics                             Anesthesia Physical Anesthesia Plan  ASA: III  Anesthesia Plan: General   Post-op Pain Management:    Induction: Intravenous  PONV Risk Score and Plan: 3 and Propofol infusion and Treatment may vary due to age or medical condition  Airway Management Planned: Mask  Additional Equipment:   Intra-op Plan:   Post-operative Plan:   Informed Consent: I have reviewed the patients History and Physical, chart, labs and discussed the procedure including the risks, benefits and alternatives for the proposed anesthesia with the patient or authorized representative who has indicated his/her understanding and acceptance.       Plan Discussed with: CRNA, Anesthesiologist and Surgeon  Anesthesia Plan Comments:         Anesthesia Quick Evaluation

## 2019-05-09 NOTE — CV Procedure (Signed)
Procedure:   DCCV  Indication:  Symptomatic atrial fibrillation  Procedure Note:  The patient signed informed consent.  They have had had therapeutic anticoagulation with rivaroxaban greater than 3 weeks.  Anesthesia was administered by Dr. Marcie Bal.  Adequate airway was maintained throughout and vital followed per protocol.  They were cardioverted x 1 with 150J of biphasic synchronized energy.  They converted to NSR.  There were no apparent complications.  The patient had normal neuro status and respiratory status post procedure with vitals stable as recorded elsewhere.    Follow up:  They will continue on current medical therapy and follow up with cardiology as scheduled.  Buford Dresser, MD PhD 05/09/2019 10:26 AM

## 2019-05-09 NOTE — Transfer of Care (Signed)
Immediate Anesthesia Transfer of Care Note  Patient: Brooke Luna  Procedure(s) Performed: CARDIOVERSION (N/A )  Patient Location: Endoscopy Unit  Anesthesia Type:General  Level of Consciousness: drowsy and responds to stimulation  Airway & Oxygen Therapy: Patient Spontanous Breathing  Post-op Assessment: Report given to RN and Post -op Vital signs reviewed and stable  Post vital signs: Reviewed and stable  Last Vitals:  Vitals Value Taken Time  BP    Temp    Pulse    Resp    SpO2      Last Pain:  Vitals:   05/09/19 0953  TempSrc: Oral  PainSc: 3          Complications: No apparent anesthesia complications

## 2019-05-09 NOTE — Progress Notes (Signed)
Pt in for EKG prior to DCCV as she will often turn  up in NSR at time of DCCV as in the past. She remains in afib at 89 bpm. She will continue with plans for cardioversion this am. To endo.

## 2019-05-09 NOTE — Interval H&P Note (Signed)
History and Physical Interval Note:  05/09/2019 9:51 AM  Brooke Luna  has presented today for surgery, with the diagnosis of AFIB.  The various methods of treatment have been discussed with the patient and family. After consideration of risks, benefits and other options for treatment, the patient has consented to  Procedure(s): CARDIOVERSION (N/A) as a surgical intervention.  The patient's history has been reviewed, patient examined, no change in status, stable for surgery.  I have reviewed the patient's chart and labs.  Questions were answered to the patient's satisfaction.     Jondavid Schreier Harrell Gave

## 2019-05-09 NOTE — Discharge Instructions (Signed)
Cardioversion, Care After This sheet gives you information about how to care for yourself after your procedure. Your health care provider may also give you more specific instructions. If you have problems or questions, contact your health care provider. What can I expect after the procedure? After the procedure, it is common to have:  Redness on chest Soreness where shocks were delivered FOLLOW UP:  If any biopsies were taken you will be contacted by phone or by letter within the next 1-3 weeks. Call [phone]  if you have not heard about the biopsies in 3 weeks.  Please also call with any specific questions about appointments or follow up tests.   Follow these instructions at home: Medicines  Take over-the-counter and prescription medicines only as told by your health care provider. You may need to take blood thinners (anticoagulants) or medicines to control your heart rhythm.  If you are taking blood thinners: ? Talk with your health care provider before you take any medicines that contain aspirin or NSAIDs, such as ibuprofen. These medicines increase your risk for dangerous bleeding. ? Take your medicine exactly as told, at the same time every day. ? Avoid activities that could cause injury or bruising, and follow instructions about how to prevent falls. ? Wear a medical alert bracelet or carry a card that lists what medicines you take. Eating and drinking   Follow instructions from your health care provider about eating and drinking restrictions. You may have to follow a low-salt (low-sodium), low-fat, and low-cholesterol diet.  Drink enough fluid to keep your urine pale yellow.  Do not drink alcohol if: ? Your health care provider tells you not to drink. ? You are pregnant, may be pregnant, or are planning to become pregnant.  If you drink alcohol: ? Limit how much you use to:  0-1 drink a day for women.  0-2 drinks a day for men. ? Be aware of how much alcohol is in  your drink. In the [place] one drink equals one 12 oz bottle of beer (355 mL), one 5 oz glass of wine (148 mL), or one 1 oz glass of hard liquor (44 mL). Activity  Ask your health care provider what activities are safe for you.  Do not drive for 24 hours if you were given a sedative during your procedure. General instructions  Do not use any products that contain nicotine or tobacco, such as cigarettes, e-cigarettes, and chewing tobacco. If you need help quitting, ask your health care provider.  Keep all follow-up visits as told by your health care provider. This is important. Contact a health care provider if you have:  A fever.  Severe pain, and medicines do not help.  Problems taking your medicines.  Irregular heartbeats. Get help right away if:   Your heartbeat becomes very fast or slow.  You have chest pain or shortness of breath.  You feel dizzy.  You faint.  You have any symptoms of a stroke. "BE FAST" is an easy way to remember the main warning signs of a stroke: ? B - Balance. Signs are dizziness, sudden trouble walking, or loss of balance. ? E - Eyes. Signs are trouble seeing or a sudden change in vision. ? F - Face. Signs are sudden weakness or numbness of the face, or the face or eyelid drooping on one side. ? A - Arms. Signs are weakness or numbness in an arm. This happens suddenly and usually on one side of the body. ? S - Speech.  Signs are sudden trouble speaking, slurred speech, or trouble understanding what people say. ? T - Time. Time to call emergency services. Write down what time symptoms started.  You have other signs of a stroke, such as: ? A sudden, severe headache with no known cause. ? Nausea or vomiting. ? Seizure. These symptoms may represent a serious problem that is an emergency. Do not wait to see if the symptoms will go away. Get medical help right away. Call your local emergency services (911 in the [place] Do not drive yourself to the  hospital. Summary  Some fatigue is common after this procedure.  You may have to take blood thinners (anticoagulants) or medicines to control your heart rhythm.  If you have symptoms of a stroke, get help right away. "BE FAST" is an easy way to remember the main warning signs of a stroke. This information is not intended to replace advice given to you by your health care provider. Make sure you discuss any questions you have with your health care provider. Document Revised: 11/05/2018 Document Reviewed: 11/05/2018 Elsevier Patient Education  [place].

## 2019-05-10 NOTE — Anesthesia Postprocedure Evaluation (Signed)
Anesthesia Post Note  Patient: Brooke Luna  Procedure(s) Performed: CARDIOVERSION (N/A )     Patient location during evaluation: Endoscopy Anesthesia Type: General Level of consciousness: awake and alert Pain management: pain level controlled Vital Signs Assessment: post-procedure vital signs reviewed and stable Respiratory status: spontaneous breathing, nonlabored ventilation, respiratory function stable and patient connected to nasal cannula oxygen Cardiovascular status: blood pressure returned to baseline and stable Postop Assessment: no apparent nausea or vomiting Anesthetic complications: no    Last Vitals:  Vitals:   05/09/19 1050 05/09/19 1100  BP:  102/63  Pulse: 63 63  Resp: 16 19  Temp:    SpO2: 95% 96%    Last Pain:  Vitals:   05/09/19 1100  TempSrc:   PainSc: 0-No pain                 Chyla Schlender S

## 2019-05-11 ENCOUNTER — Encounter: Payer: Self-pay | Admitting: *Deleted

## 2019-05-16 ENCOUNTER — Encounter (HOSPITAL_COMMUNITY): Payer: Self-pay | Admitting: Nurse Practitioner

## 2019-05-16 ENCOUNTER — Other Ambulatory Visit: Payer: Self-pay

## 2019-05-16 ENCOUNTER — Ambulatory Visit (HOSPITAL_COMMUNITY)
Admission: RE | Admit: 2019-05-16 | Discharge: 2019-05-16 | Disposition: A | Discharge status: Home / Self Care | Payer: Medicare Other | Source: Ambulatory Visit | Attending: Nurse Practitioner | Admitting: Nurse Practitioner

## 2019-05-16 VITALS — BP 124/68 | HR 60 | Ht 64.0 in | Wt 260.6 lb

## 2019-05-16 DIAGNOSIS — Z9842 Cataract extraction status, left eye: Secondary | ICD-10-CM | POA: Diagnosis not present

## 2019-05-16 DIAGNOSIS — D6869 Other thrombophilia: Secondary | ICD-10-CM | POA: Diagnosis not present

## 2019-05-16 DIAGNOSIS — Z87891 Personal history of nicotine dependence: Secondary | ICD-10-CM | POA: Diagnosis not present

## 2019-05-16 DIAGNOSIS — I10 Essential (primary) hypertension: Secondary | ICD-10-CM | POA: Diagnosis not present

## 2019-05-16 DIAGNOSIS — E669 Obesity, unspecified: Secondary | ICD-10-CM | POA: Insufficient documentation

## 2019-05-16 DIAGNOSIS — Z885 Allergy status to narcotic agent status: Secondary | ICD-10-CM | POA: Diagnosis not present

## 2019-05-16 DIAGNOSIS — Z79899 Other long term (current) drug therapy: Secondary | ICD-10-CM | POA: Diagnosis not present

## 2019-05-16 DIAGNOSIS — Z833 Family history of diabetes mellitus: Secondary | ICD-10-CM | POA: Diagnosis not present

## 2019-05-16 DIAGNOSIS — Z961 Presence of intraocular lens: Secondary | ICD-10-CM | POA: Diagnosis not present

## 2019-05-16 DIAGNOSIS — Z886 Allergy status to analgesic agent status: Secondary | ICD-10-CM | POA: Diagnosis not present

## 2019-05-16 DIAGNOSIS — M1990 Unspecified osteoarthritis, unspecified site: Secondary | ICD-10-CM | POA: Insufficient documentation

## 2019-05-16 DIAGNOSIS — Z6841 Body Mass Index (BMI) 40.0 and over, adult: Secondary | ICD-10-CM | POA: Insufficient documentation

## 2019-05-16 DIAGNOSIS — Z9841 Cataract extraction status, right eye: Secondary | ICD-10-CM | POA: Diagnosis not present

## 2019-05-16 DIAGNOSIS — Z96651 Presence of right artificial knee joint: Secondary | ICD-10-CM | POA: Diagnosis not present

## 2019-05-16 DIAGNOSIS — Z7901 Long term (current) use of anticoagulants: Secondary | ICD-10-CM | POA: Diagnosis not present

## 2019-05-16 DIAGNOSIS — K219 Gastro-esophageal reflux disease without esophagitis: Secondary | ICD-10-CM | POA: Diagnosis not present

## 2019-05-16 DIAGNOSIS — I480 Paroxysmal atrial fibrillation: Secondary | ICD-10-CM | POA: Diagnosis not present

## 2019-05-16 DIAGNOSIS — Z823 Family history of stroke: Secondary | ICD-10-CM | POA: Diagnosis not present

## 2019-05-16 DIAGNOSIS — I2510 Atherosclerotic heart disease of native coronary artery without angina pectoris: Secondary | ICD-10-CM | POA: Diagnosis not present

## 2019-05-16 NOTE — Addendum Note (Signed)
Encounter addended by: Sherran Needs, NP on: 05/16/2019 2:18 PM  Actions taken: Clinical Note Signed

## 2019-05-16 NOTE — Progress Notes (Signed)
Primary Care Physician: Monico Blitz, MD Referring Physician: [facility] triage Cardiologist: Dr. Doran Durand Brooke Luna is a 74 y.o. female with a h/o HTN, CAD, obesity as well as  paroxsymal afib since 2016. She has been scheduled for a few cardioversions but always returns to [place] before the procedure.   She is on xarelto 20 mg qd for a chadsvasc score of at least 4.   She does not smoke or drink alcohol, minimum caffeine. She does snore. She states that she sleeps in a recliner because of her back and she doesn't breath well on her back.   She in in the afib clinic today as her afib burden has increased to the point that sh is now persistent x 3 weeks and wants options to explore how to mange going forward. AAD's were discussed but pt did not want to purse.  F/u in afib clinic, 05/03/19. She called to be seen for afib for the last 4-5 days. She has increased shortness of breath, and fatigue. She  would want an ablation. This would not be by guidelines to use AAD's first. She would not be an candidate for flecainide or Multaq  because of reduced  EF. She does not like the SE profile of amiodarone and Celexa may cause an issue with  qtc with Tikosyn or  Sotalol. She is not very willing to consider changing Celexa.  For the time being I will schedule cardioversion. She would like to skip antiarrythmic's and be considered for ablation. I will get her an appointment with EP after cardioversion. She is using 30 mg Cardizem every 4 hours to control v rates. She continues on xarelto for CHA2DS2VASc score of 4.   F/u in afib clinic,05/16/19. She had a successful cardioversion 05/09/19. She continues in SR and feels improved.   Today, she denies symptoms of palpitations, chest pain, shortness of breath, orthopnea, PND, lower extremity edema, dizziness, presyncope, syncope, or neurologic sequela.+ for fatigue , dyspnea, mild LLE. The patient is tolerating medications without difficulties  and is otherwise without complaint today.   Past Medical History:  Diagnosis Date  . Arthritis    pain and oa  right knee and pain in rt leg and foot--also pain left knee-but right is worse  . Atrial fibrillation ([place])   . Back pain    lumbar bulging disk  . Contact dermatitis    underside of left leg--always in the same spots--comes and goes  . Diverticulosis   . GERD (gastroesophageal reflux disease)    diet control  . Hemorrhoids    Past Surgical History:  Procedure Laterality Date  . CARDIOVERSION N/A 05/09/2019   Procedure: CARDIOVERSION;  Surgeon: Buford Dresser, MD;  Location: [facility] ENDOSCOPY;  Service: Cardiovascular;  Laterality: N/A;  . CATARACT EXTRACTION W/PHACO Left 02/22/2015   Procedure: CATARACT EXTRACTION PHACO AND INTRAOCULAR LENS PLACEMENT LEFT EYE CDE=5.41;  Surgeon: Tonny Branch, MD;  Location: [facility] ORS;  Service: Ophthalmology;  Laterality: Left;  . CATARACT EXTRACTION W/PHACO Right 03/26/2015   Procedure: CATARACT EXTRACTION PHACO AND INTRAOCULAR LENS PLACEMENT (IOC);  Surgeon: Tonny Branch, MD;  Location: [facility] ORS;  Service: Ophthalmology;  Laterality: Right;  CDE:5.01  . CHOLECYSTECTOMY    . surgery for broken right elbow Right   . TOTAL KNEE ARTHROPLASTY  11/03/2011   Procedure: TOTAL KNEE ARTHROPLASTY;  Surgeon: Gearlean Alf, MD;  Location: [facility] ORS;  Service: Orthopedics;  Laterality: Right;  . TUBAL LIGATION      Current Outpatient  Medications  Medication Sig Dispense Refill  . citalopram (CELEXA) 20 MG tablet Take 20 mg by mouth at bedtime.     . clonazePAM (KLONOPIN) 0.5 MG tablet Take 0.5 mg by mouth 2 (two) times daily as needed for anxiety.     Marland Kitchen diltiazem (CARDIZEM CD) 240 MG 24 hr capsule TAKE 1 CAPSULE BY MOUTH EVERY DAY (Patient taking differently: Take 240 mg by mouth every evening. ) 90 capsule 3  . diltiazem (CARDIZEM) 30 MG tablet TAKE 1 TABLET EVERY 8 HOURS AS NEEDED FOR PALPITATIONS (Patient taking differently: Take 30 mg by mouth every 4  (four) hours as needed (palpitations). ) 90 tablet 1  . diphenhydrAMINE (BENADRYL) 25 MG tablet Take 25 mg by mouth daily as needed for allergies.    . diphenhydrAMINE-APAP, sleep, (EXCEDRIN PM) 38-500 MG TABS Take 1 tablet by mouth at bedtime as needed (sleep/pain).    . furosemide (LASIX) 20 MG tablet TAKE 1 TABLET BY MOUTH EVERY DAY (Patient taking differently: Take 20 mg by mouth every evening. ) 90 tablet 3  . Multiple Vitamin (MULTIVITAMIN WITH MINERALS) TABS tablet Take 1 tablet by mouth once a week.    . rivaroxaban (XARELTO) 20 MG TABS tablet Take 20 mg by mouth daily with supper.     No current facility-administered medications for this encounter.    Allergies  Allergen Reactions  . Codeine Shortness Of Breath and Itching  . Meloxicam Other (See Comments)    Stomach iritation     Social History   Socioeconomic History  . Marital status: Married    Spouse name: Not on file  . Number of children: Not on file  . Years of education: Not on file  . Highest education level: Not on file  Occupational History  . Not on file  Tobacco Use  . Smoking status: Former Smoker    Packs/day: 1.00    Years: 38.00    Pack years: 38.00    Start date: 06/25/1957    Quit date: 04/28/1996    Years since quitting: 23.0  . Smokeless tobacco: Never Used  . Tobacco comment: quit smoking 1998  Substance and Sexual Activity  . Alcohol use: Yes    Alcohol/week: 1.0 standard drinks    Types: 1 Cans of beer per week  . Drug use: No  . Sexual activity: Not on file  Other Topics Concern  . Not on file  Social History Narrative  . Not on file   Social Determinants of Health   Financial Resource Strain:   . Difficulty of Paying Living Expenses: Not on file  Food Insecurity:   . Worried About Charity fundraiser in the Last Year: Not on file  . Ran Out of Food in the Last Year: Not on file  Transportation Needs:   . Lack of Transportation (Medical): Not on file  . Lack of Transportation  (Non-Medical): Not on file  Physical Activity:   . Days of Exercise per Week: Not on file  . Minutes of Exercise per Session: Not on file  Stress:   . Feeling of Stress : Not on file  Social Connections:   . Frequency of Communication with Friends and Family: Not on file  . Frequency of Social Gatherings with Friends and Family: Not on file  . Attends Religious Services: Not on file  . Active Member of Clubs or Organizations: Not on file  . Attends Archivist Meetings: Not on file  . Marital Status: Not on  file  Intimate Partner Violence:   . Fear of Current or Ex-Partner: Not on file  . Emotionally Abused: Not on file  . Physically Abused: Not on file  . Sexually Abused: Not on file    Family History  Problem Relation Age of Onset  . Diabetes Mother   . Stroke Mother   . Stroke Father     ROS- All systems are reviewed and negative except as per the HPI above  Physical Exam: Vitals:   05/16/19 1410  BP: 124/68  Pulse: 60  Weight: 118.2 kg  Height: 5\' 4"  (1.626 m)   Wt Readings from Last 3 Encounters:  05/16/19 118.2 kg  05/03/19 118.8 kg  03/18/19 120.2 kg    Labs: Lab Results  Component Value Date   NA 140 05/03/2019   K 4.1 05/03/2019   CL 103 05/03/2019   CO2 26 05/03/2019   GLUCOSE 98 05/03/2019   BUN 9 05/03/2019   CREATININE 0.84 05/03/2019   CALCIUM 8.8 (L) 05/03/2019   Lab Results  Component Value Date   INR 0.95 10/23/2011   No results found for: CHOL, HDL, LDLCALC, TRIG   GEN- The patient is well appearing, alert and oriented x 3 today.   Head- normocephalic, atraumatic Eyes-  Sclera clear, conjunctiva pink Ears- hearing intact Oropharynx- clear Neck- supple, no JVP Lymph- no cervical lymphadenopathy Lungs- Clear to ausculation bilaterally, normal work of breathing Heart- regular rate and rhythm, no murmurs, rubs or gallops, PMI not laterally displaced GI- soft, NT, ND, + BS Extremities- no clubbing, cyanosis, left lower  extremity edema worse than right(1+/trace)  MS- no significant deformity or atrophy Skin- no rash or lesion Psych- euthymic mood, full affect Neuro- strength and sensation are intact  EKG-  NSR at 60 bpm, pr int 166 ms, qrs int 90 ms,s sqtc 446 ms Epic records reviewed Echo- Study Conclusions 01/17/19- EF abnormal at 45-50%, LA chamber is 43 cm     Assessment and Plan: 1. Paroxysmal afib  Back in afib x 4 days  Successful cardioversion Discussed antiarrythmic's with pt I feel Multaq  is not an option with reduced EF Flecainide not an option for this as well as well as h/o CAD I feel that she is relatively  young for amiodarone with potential side effects That leaves tikosyn or sotalol  Pt does not know if she wants to commit to this option   She is also on celexa and with qtc prolonging potential with tikosyn may need to be weaned off if she decides on this   She prefers not to be weaned off as her mental status is stable Patient also wants to  be considerd for front line ablation I will request appointment but I tried to set expectations that she may not be considered a good  candidate, weight is also contributing to afib  Continue xarelto for chadsvasc score of 4 Continue diltiazem 240 mg daily and 30 mg as needed to control v rates   Will request appointment with Dr. Lawrence Marseilles C. Azarah Dacy, [facility] [address] [place] [employer] [place], [place] [address] [phone]

## 2019-05-23 ENCOUNTER — Telehealth (INDEPENDENT_AMBULATORY_CARE_PROVIDER_SITE_OTHER): Payer: Medicare Other | Admitting: Internal Medicine

## 2019-05-23 ENCOUNTER — Other Ambulatory Visit: Payer: Self-pay

## 2019-05-23 ENCOUNTER — Encounter: Payer: Self-pay | Admitting: Internal Medicine

## 2019-05-23 VITALS — BP 125/60 | HR 59 | Ht 64.0 in | Wt 259.0 lb

## 2019-05-23 DIAGNOSIS — D6869 Other thrombophilia: Secondary | ICD-10-CM | POA: Diagnosis not present

## 2019-05-23 DIAGNOSIS — I4819 Other persistent atrial fibrillation: Secondary | ICD-10-CM

## 2019-05-23 NOTE — Progress Notes (Signed)
Electrophysiology TeleHealth Note   Due to national recommendations of social distancing due to [place] 19, Audio telehealth visit is felt to be most appropriate for this patient at this time.  See MyChart message from today for patient consent regarding telehealth for [facility]   Date:  05/23/2019   ID:  Brooke Luna [place], DOB Jun 13, 1945, MRN [id number]  Location: home  Provider location: [place] Evaluation Performed: New patient consult  PCP:  Monico Blitz, MD  Cardiologist:  Carlyle Dolly, MD  Electrophysiologist:  None   Chief Complaint:  afib  History of Present Illness:    Brooke Luna is a 74 y.o. female who presents via audio/video conferencing for a telehealth visit today.   The patient is referred for new consultation regarding afib by Dr Harl Bowie and the AF clinic.  She reports having afib for about 2.5 years.  Episodes have increased in frequency and duration.  She reports SOB and decreased exercise tolerance with her afib.  Episodes typically last several days.  She has required cardioversion recently.  She has had episodes lasting 10-15 days.  She snores.  She had a sleep study previously at [facility] which (per patient) did not show sleep apnea.  Today, she denies symptoms of chest pain, shortness of breath, orthopnea, PND, lower extremity edema, claudication, dizziness, presyncope, syncope, bleeding, or neurologic sequela. The patient is tolerating medications without difficulties and is otherwise without complaint today.    Past Medical History:  Diagnosis Date  . Arthritis    pain and oa  right knee and pain in rt leg and foot--also pain left knee-but right is worse  . Back pain    lumbar bulging disk  . Contact dermatitis    underside of left leg--always in the same spots--comes and goes  . Diverticulosis   . GERD (gastroesophageal reflux disease)    diet control  . Hemorrhoids   . Obesity   . Persistent atrial  fibrillation [facility])     Past Surgical History:  Procedure Laterality Date  . CARDIOVERSION N/A 05/09/2019   Procedure: CARDIOVERSION;  Surgeon: Buford Dresser, MD;  Location: [facility] ENDOSCOPY;  Service: Cardiovascular;  Laterality: N/A;  . CATARACT EXTRACTION W/PHACO Left 02/22/2015   Procedure: CATARACT EXTRACTION PHACO AND INTRAOCULAR LENS PLACEMENT LEFT EYE CDE=5.41;  Surgeon: Tonny Branch, MD;  Location: [facility] ORS;  Service: Ophthalmology;  Laterality: Left;  . CATARACT EXTRACTION W/PHACO Right 03/26/2015   Procedure: CATARACT EXTRACTION PHACO AND INTRAOCULAR LENS PLACEMENT (IOC);  Surgeon: Tonny Branch, MD;  Location: [facility] ORS;  Service: Ophthalmology;  Laterality: Right;  CDE:5.01  . CHOLECYSTECTOMY    . surgery for broken right elbow Right   . TOTAL KNEE ARTHROPLASTY  11/03/2011   Procedure: TOTAL KNEE ARTHROPLASTY;  Surgeon: Gearlean Alf, MD;  Location: [facility] ORS;  Service: Orthopedics;  Laterality: Right;  . TUBAL LIGATION      Current Outpatient Medications  Medication Sig Dispense Refill  . citalopram (CELEXA) 20 MG tablet Take 20 mg by mouth at bedtime.     . clonazePAM (KLONOPIN) 0.5 MG tablet Take 0.5 mg by mouth 2 (two) times daily as needed for anxiety.     Marland Kitchen diltiazem (CARDIZEM CD) 240 MG 24 hr capsule TAKE 1 CAPSULE BY MOUTH EVERY DAY 90 capsule 3  . diltiazem (CARDIZEM) 30 MG tablet TAKE 1 TABLET EVERY 8 HOURS AS NEEDED FOR PALPITATIONS 90 tablet 1  . diphenhydrAMINE (BENADRYL) 25 MG tablet Take 25 mg by mouth daily as needed  for allergies.    . diphenhydrAMINE-APAP, sleep, (EXCEDRIN PM) 38-500 MG TABS Take 1 tablet by mouth at bedtime as needed (sleep/pain).    . furosemide (LASIX) 20 MG tablet TAKE 1 TABLET BY MOUTH EVERY DAY 90 tablet 3  . Multiple Vitamin (MULTIVITAMIN WITH MINERALS) TABS tablet Take 1 tablet by mouth once a week.    . rivaroxaban (XARELTO) 20 MG TABS tablet Take 20 mg by mouth daily with supper.     No current facility-administered medications for this  visit.    Allergies:   Codeine and Meloxicam   Social History:  The patient  reports that she quit smoking about 23 years ago. She started smoking about 61 years ago. She has a 38.00 pack-year smoking history. She has never used smokeless tobacco. She reports current alcohol use of about 1.0 standard drinks of alcohol per week. She reports that she does not use drugs.   Family History:  The patient's family history includes Diabetes in her mother; Stroke in her father and mother.    ROS:  Please see the history of present illness.   All other systems are personally reviewed and negative.    Exam:    Vital Signs:  BP 125/60   Pulse (!) 59   Ht 5\' 4"  (1.626 m)   Wt 259 lb (117.5 kg)   BMI 44.46 kg/m    Well sounding, alert and conversant    Labs/Other Tests and Data Reviewed:    Recent Labs: 05/03/2019: BUN 9; Creatinine, Ser 0.84; Hemoglobin 14.4; Platelets 386; Potassium 4.1; Sodium 140   Wt Readings from Last 3 Encounters:  05/23/19 259 lb (117.5 kg)  05/16/19 260 lb 9.6 oz (118.2 kg)  05/03/19 262 lb (118.8 kg)     Other studies personally reviewed: Additional studies/ records that were reviewed today include: AF clinic notes,  Dr Harl Bowie notes,  Echo,   Review of the above records today demonstrates: as above ASSESSMENT & PLAN:    1.  Persistent atrial fibrillation  The patient has symptomatic, recurrent atrial fibrillation.  Her paroxysmal atrial fibrillation has become persistent. she has failed medical therapy with diltiazem Chads2vasc score is at least 2.  she is anticoagulated with xarelto. Therapeutic strategies for afib including medicine and ablation were discussed in detail with the patient today. Risk, benefits, and alternatives to EP study and radiofrequency ablation for afib were also discussed in detail today. These risks include but are not limited to stroke, bleeding, vascular damage, tamponade, perforation, damage to the esophagus, lungs, and other  structures, pulmonary vein stenosis, worsening renal function, and death. The patient understands these risk and wishes to proceed.  We will therefore proceed with catheter ablation at the next available time.  Carto, ICE, anesthesia are requested for the procedure.  Will also obtain cardiac CT prior to the procedure to exclude LAA thrombus and further evaluate atrial anatomy.  I have reviewed the patients BMI and decreased success rates with ablation at length today.  Weight loss is strongly advised.  Per Guijian et al (PACE 2013; [id number]), patients with BMI 25-29.9 (obese) have a 27% increase in AF recurrence post ablation.  Patients with BMI >30 have a 31% increase in AF recurrence post ablation when compared to those with BMI <25.  2. Morbid obesity Body mass index is 44.46 kg/m. Lifestyle modification encouraged    Current medicines are reviewed at length with the patient today.   The patient does not have concerns regarding her medicines.  The  following changes were made today:  none  Labs/ tests ordered today include:  No orders of the defined types were placed in this encounter.   Patient Risk:  after full review of this patients clinical status, I feel that they are at high risk at this time.   Today, I have spent 20 minutes with the patient with telehealth technology discussing afib .    Signed, Thompson Grayer MD, [facility] 05/23/2019 2:31 PM   [place] [address] [phone] (office) [phone] (fax)

## 2019-05-23 NOTE — H&P (View-Only) (Signed)
Electrophysiology TeleHealth Note   Due to national recommendations of social distancing due to [place] 19, Audio telehealth visit is felt to be most appropriate for this patient at this time.  See MyChart message from today for patient consent regarding telehealth for [name].   Date:  05/23/2019   ID:  Brooke Luna, DOB [birth year], MRN [id number]  Location: home  Provider location: [place] Evaluation Performed: New patient consult  PCP:  Monico Blitz, MD  Cardiologist:  Carlyle Dolly, MD  Electrophysiologist:  None   Chief Complaint:  afib  History of Present Illness:    Brooke Luna is a 74 y.o. female who presents via audio/video conferencing for a telehealth visit today.   The patient is referred for new consultation regarding afib by Dr Harl Bowie and the AF clinic.  She reports having afib for about 2.5 years.  Episodes have increased in frequency and duration.  She reports SOB and decreased exercise tolerance with her afib.  Episodes typically last several days.  She has required cardioversion recently.  She has had episodes lasting 10-15 days.  She snores.  She had a sleep study previously at [facility] which (per patient) did not show sleep apnea.  Today, she denies symptoms of chest pain, shortness of breath, orthopnea, PND, lower extremity edema, claudication, dizziness, presyncope, syncope, bleeding, or neurologic sequela. The patient is tolerating medications without difficulties and is otherwise without complaint today.    Past Medical History:  Diagnosis Date  . Arthritis    pain and oa  right knee and pain in rt leg and foot--also pain left knee-but right is worse  . Back pain    lumbar bulging disk  . Contact dermatitis    underside of left leg--always in the same spots--comes and goes  . Diverticulosis   . GERD (gastroesophageal reflux disease)    diet control  . Hemorrhoids   . Obesity   . Persistent atrial  fibrillation [facility])     Past Surgical History:  Procedure Laterality Date  . CARDIOVERSION N/A 05/09/2019   Procedure: CARDIOVERSION;  Surgeon: Buford Dresser, MD;  Location: [facility] ENDOSCOPY;  Service: Cardiovascular;  Laterality: N/A;  . CATARACT EXTRACTION W/PHACO Left 02/22/2015   Procedure: CATARACT EXTRACTION PHACO AND INTRAOCULAR LENS PLACEMENT LEFT EYE CDE=5.41;  Surgeon: Tonny Branch, MD;  Location: [facility] ORS;  Service: Ophthalmology;  Laterality: Left;  . CATARACT EXTRACTION W/PHACO Right 03/26/2015   Procedure: CATARACT EXTRACTION PHACO AND INTRAOCULAR LENS PLACEMENT (IOC);  Surgeon: Tonny Branch, MD;  Location: [facility] ORS;  Service: Ophthalmology;  Laterality: Right;  CDE:5.01  . CHOLECYSTECTOMY    . surgery for broken right elbow Right   . TOTAL KNEE ARTHROPLASTY  11/03/2011   Procedure: TOTAL KNEE ARTHROPLASTY;  Surgeon: Gearlean Alf, MD;  Location: [facility] ORS;  Service: Orthopedics;  Laterality: Right;  . TUBAL LIGATION      Current Outpatient Medications  Medication Sig Dispense Refill  . citalopram (CELEXA) 20 MG tablet Take 20 mg by mouth at bedtime.     . clonazePAM (KLONOPIN) 0.5 MG tablet Take 0.5 mg by mouth 2 (two) times daily as needed for anxiety.     Marland Kitchen diltiazem (CARDIZEM CD) 240 MG 24 hr capsule TAKE 1 CAPSULE BY MOUTH EVERY DAY 90 capsule 3  . diltiazem (CARDIZEM) 30 MG tablet TAKE 1 TABLET EVERY 8 HOURS AS NEEDED FOR PALPITATIONS 90 tablet 1  . diphenhydrAMINE (BENADRYL) 25 MG tablet Take 25 mg by mouth daily as needed  for allergies.    . diphenhydrAMINE-APAP, sleep, (EXCEDRIN PM) 38-500 MG TABS Take 1 tablet by mouth at bedtime as needed (sleep/pain).    . furosemide (LASIX) 20 MG tablet TAKE 1 TABLET BY MOUTH EVERY DAY 90 tablet 3  . Multiple Vitamin (MULTIVITAMIN WITH MINERALS) TABS tablet Take 1 tablet by mouth once a week.    . rivaroxaban (XARELTO) 20 MG TABS tablet Take 20 mg by mouth daily with supper.     No current facility-administered medications for this  visit.    Allergies:   Codeine and Meloxicam   Social History:  The patient  reports that she quit smoking about 23 years ago. She started smoking about 61 years ago. She has a 38.00 pack-year smoking history. She has never used smokeless tobacco. She reports current alcohol use of about 1.0 standard drinks of alcohol per week. She reports that she does not use drugs.   Family History:  The patient's family history includes Diabetes in her mother; Stroke in her father and mother.    ROS:  Please see the history of present illness.   All other systems are personally reviewed and negative.    Exam:    Vital Signs:  BP 125/60   Pulse (!) 59   Ht 5\' 4"  (1.626 m)   Wt 259 lb (117.5 kg)   BMI 44.46 kg/m    Well sounding, alert and conversant    Labs/Other Tests and Data Reviewed:    Recent Labs: 05/03/2019: BUN 9; Creatinine, Ser 0.84; Hemoglobin 14.4; Platelets 386; Potassium 4.1; Sodium 140   Wt Readings from Last 3 Encounters:  05/23/19 259 lb (117.5 kg)  05/16/19 260 lb 9.6 oz (118.2 kg)  05/03/19 262 lb (118.8 kg)     Other studies personally reviewed: Additional studies/ records that were reviewed today include: AF clinic notes,  Dr Harl Bowie notes,  Echo,   Review of the above records today demonstrates: as above ASSESSMENT & PLAN:    1.  Persistent atrial fibrillation  The patient has symptomatic, recurrent atrial fibrillation.  Her paroxysmal atrial fibrillation has become persistent. she has failed medical therapy with diltiazem Chads2vasc score is at least 2.  she is anticoagulated with xarelto. Therapeutic strategies for afib including medicine and ablation were discussed in detail with the patient today. Risk, benefits, and alternatives to EP study and radiofrequency ablation for afib were also discussed in detail today. These risks include but are not limited to stroke, bleeding, vascular damage, tamponade, perforation, damage to the esophagus, lungs, and other  structures, pulmonary vein stenosis, worsening renal function, and death. The patient understands these risk and wishes to proceed.  We will therefore proceed with catheter ablation at the next available time.  Carto, ICE, anesthesia are requested for the procedure.  Will also obtain cardiac CT prior to the procedure to exclude LAA thrombus and further evaluate atrial anatomy.  I have reviewed the patients BMI and decreased success rates with ablation at length today.  Weight loss is strongly advised.  Per Guijian et al (PACE 2013; [id number]), patients with BMI 25-29.9 (obese) have a 27% increase in AF recurrence post ablation.  Patients with BMI >30 have a 31% increase in AF recurrence post ablation when compared to those with BMI <25.  2. Morbid obesity Body mass index is 44.46 kg/m. Lifestyle modification encouraged    Current medicines are reviewed at length with the patient today.   The patient does not have concerns regarding her medicines.  The  following changes were made today:  none  Labs/ tests ordered today include:  No orders of the defined types were placed in this encounter.   Patient Risk:  after full review of this patients clinical status, I feel that they are at high risk at this time.   Today, I have spent 20 minutes with the patient with telehealth technology discussing afib .    Signed, Thompson Grayer MD, [facility] 05/23/2019 2:31 PM   [place] [address] [phone] (office) [phone] (fax)

## 2019-05-27 ENCOUNTER — Telehealth: Payer: Self-pay

## 2019-05-27 ENCOUNTER — Other Ambulatory Visit (HOSPITAL_COMMUNITY)
Admission: RE | Admit: 2019-05-27 | Discharge: 2019-05-27 | Disposition: A | Discharge status: Home / Self Care | Payer: Medicare Other | Source: Ambulatory Visit | Attending: Internal Medicine | Admitting: Internal Medicine

## 2019-05-27 ENCOUNTER — Other Ambulatory Visit: Payer: Self-pay | Admitting: *Deleted

## 2019-05-27 ENCOUNTER — Other Ambulatory Visit: Payer: Self-pay

## 2019-05-27 DIAGNOSIS — I4891 Unspecified atrial fibrillation: Secondary | ICD-10-CM

## 2019-05-27 MED ORDER — METOPROLOL TARTRATE 50 MG PO TABS: ORAL_TABLET | ORAL | 0 refills | Status: DC

## 2019-05-27 NOTE — Telephone Encounter (Signed)
Outreach made to Pt to schedule afib ablation  Pt would prefer February 9 for procedure.  Pt has had recent eye surgery, and requires follow up on Monday 2/1 to discuss another surgery.  Advised Pt that if she is going to need to hold her blood thinner she would have to delay her afib ablation.  Pt will discuss with eye doctor on Monday and confirm she would not need to hold her blood thinner for any eye surgery she may need coming up.  Preemptively scheduled all upcoming tests for afib ablation.  Notified Pt would confirm on Monday that ablation is ok to proceed and will give final direction.

## 2019-05-30 DIAGNOSIS — H26493 Other secondary cataract, bilateral: Secondary | ICD-10-CM | POA: Diagnosis not present

## 2019-05-30 DIAGNOSIS — Z961 Presence of intraocular lens: Secondary | ICD-10-CM | POA: Diagnosis not present

## 2019-05-30 NOTE — Telephone Encounter (Signed)
Outreach made to Pt.  Pt had eye procedure today and she is ok to proceed with ablation.  Did not miss any blood thinner doses.  Gave verbal instructions.  Will call again on Wednesday to ensure she has received her instructions.

## 2019-06-01 ENCOUNTER — Telehealth (HOSPITAL_COMMUNITY): Payer: Self-pay | Admitting: Emergency Medicine

## 2019-06-01 NOTE — Telephone Encounter (Signed)
Unable to leave VM

## 2019-06-01 NOTE — Telephone Encounter (Signed)
Call received from Pt.  Pt has not received her mailed instructions yet.  Gave Pt's instructions verbally.  Pt indicates understanding.  Will fax her labs to [facility] tomorrow.

## 2019-06-02 ENCOUNTER — Other Ambulatory Visit: Payer: Self-pay

## 2019-06-02 ENCOUNTER — Ambulatory Visit (HOSPITAL_COMMUNITY)
Admission: RE | Admit: 2019-06-02 | Discharge: 2019-06-02 | Disposition: A | Discharge status: Home / Self Care | Payer: Medicare Other | Source: Ambulatory Visit | Attending: Internal Medicine | Admitting: Internal Medicine

## 2019-06-02 DIAGNOSIS — I4891 Unspecified atrial fibrillation: Secondary | ICD-10-CM | POA: Diagnosis not present

## 2019-06-02 MED ORDER — IOHEXOL 350 MG/ML SOLN
80.0000 mL | Freq: Once | INTRAVENOUS | Status: AC | PRN
  Administered 2019-06-02: 14:00:00 80 mL via INTRAVENOUS

## 2019-06-02 NOTE — Telephone Encounter (Signed)
Faxed lab orders to [facility] lab.  Fax # [phone] per lab technician in lab

## 2019-06-03 ENCOUNTER — Other Ambulatory Visit (HOSPITAL_COMMUNITY)
Admission: RE | Admit: 2019-06-03 | Discharge: 2019-06-03 | Disposition: A | Discharge status: Home / Self Care | Payer: Medicare Other | Source: Ambulatory Visit | Attending: Internal Medicine | Admitting: Internal Medicine

## 2019-06-03 DIAGNOSIS — Z01812 Encounter for preprocedural laboratory examination: Secondary | ICD-10-CM | POA: Insufficient documentation

## 2019-06-03 DIAGNOSIS — Z20822 Contact with and (suspected) exposure to covid-19: Secondary | ICD-10-CM | POA: Diagnosis not present

## 2019-06-03 LAB — CBC WITH DIFFERENTIAL/PLATELET
Abs Immature Granulocytes: 0.03 10*3/uL (ref 0.00–0.07)
Basophils Absolute: 0.1 10*3/uL (ref 0.0–0.1)
Basophils Relative: 1 %
Eosinophils Absolute: 0.5 10*3/uL (ref 0.0–0.5)
Eosinophils Relative: 6 %
HCT: 45.6 % (ref 36.0–46.0)
Hemoglobin: 14.1 g/dL (ref 12.0–15.0)
Immature Granulocytes: 0 %
Lymphocytes Relative: 17 %
Lymphs Abs: 1.5 10*3/uL (ref 0.7–4.0)
MCH: 27.6 pg (ref 26.0–34.0)
MCHC: 30.9 g/dL (ref 30.0–36.0)
MCV: 89.2 fL (ref 80.0–100.0)
Monocytes Absolute: 0.4 10*3/uL (ref 0.1–1.0)
Monocytes Relative: 5 %
Neutro Abs: 6.2 10*3/uL (ref 1.7–7.7)
Neutrophils Relative %: 71 %
Platelets: 373 10*3/uL (ref 150–400)
RBC: 5.11 MIL/uL (ref 3.87–5.11)
RDW: 13.7 % (ref 11.5–15.5)
WBC: 8.8 10*3/uL (ref 4.0–10.5)
nRBC: 0 % (ref 0.0–0.2)

## 2019-06-03 LAB — BASIC METABOLIC PANEL
Anion gap: 9 (ref 5–15)
BUN: 11 mg/dL (ref 8–23)
CO2: 29 mmol/L (ref 22–32)
Calcium: 8.6 mg/dL — ABNORMAL LOW (ref 8.9–10.3)
Chloride: 102 mmol/L (ref 98–111)
Creatinine, Ser: 0.82 mg/dL (ref 0.44–1.00)
GFR calc Af Amer: >60 mL/min (ref >60)
GFR calc non Af Amer: >60 mL/min (ref >60)
Glucose, Bld: 128 mg/dL — ABNORMAL HIGH (ref 70–99)
Potassium: 3.9 mmol/L (ref 3.5–5.1)
Sodium: 140 mmol/L (ref 135–145)

## 2019-06-03 LAB — SARS CORONAVIRUS 2 (TAT 6-24 HRS): SARS Coronavirus 2: NEGATIVE

## 2019-06-06 NOTE — Progress Notes (Signed)
Instructed patient on the following items: Arrival time 0830 Nothing to eat or drink after midnight No meds AM of procedure Responsible person to drive you home and stay with you for 24 hrs  Have you missed any doses of anti-coagulant no

## 2019-06-07 ENCOUNTER — Ambulatory Visit (HOSPITAL_COMMUNITY): Payer: Medicare Other | Admitting: Certified Registered Nurse Anesthetist

## 2019-06-07 ENCOUNTER — Ambulatory Visit (HOSPITAL_COMMUNITY)
Admission: RE | Admit: 2019-06-07 | Discharge: 2019-06-07 | Disposition: A | Discharge status: Home / Self Care | Payer: Medicare Other | Attending: Internal Medicine | Admitting: Internal Medicine

## 2019-06-07 ENCOUNTER — Other Ambulatory Visit: Payer: Self-pay

## 2019-06-07 ENCOUNTER — Encounter (HOSPITAL_COMMUNITY)
Admission: RE | Disposition: A | Discharge status: Home / Self Care | Payer: Medicare Other | Source: Home / Self Care | Attending: Internal Medicine

## 2019-06-07 DIAGNOSIS — Z885 Allergy status to narcotic agent status: Secondary | ICD-10-CM | POA: Insufficient documentation

## 2019-06-07 DIAGNOSIS — Z7901 Long term (current) use of anticoagulants: Secondary | ICD-10-CM | POA: Diagnosis not present

## 2019-06-07 DIAGNOSIS — Z87891 Personal history of nicotine dependence: Secondary | ICD-10-CM | POA: Insufficient documentation

## 2019-06-07 DIAGNOSIS — K219 Gastro-esophageal reflux disease without esophagitis: Secondary | ICD-10-CM | POA: Diagnosis not present

## 2019-06-07 DIAGNOSIS — M1990 Unspecified osteoarthritis, unspecified site: Secondary | ICD-10-CM | POA: Diagnosis not present

## 2019-06-07 DIAGNOSIS — I4819 Other persistent atrial fibrillation: Secondary | ICD-10-CM | POA: Diagnosis not present

## 2019-06-07 DIAGNOSIS — Z888 Allergy status to other drugs, medicaments and biological substances status: Secondary | ICD-10-CM | POA: Diagnosis not present

## 2019-06-07 DIAGNOSIS — Z6841 Body Mass Index (BMI) 40.0 and over, adult: Secondary | ICD-10-CM | POA: Insufficient documentation

## 2019-06-07 DIAGNOSIS — I480 Paroxysmal atrial fibrillation: Secondary | ICD-10-CM | POA: Insufficient documentation

## 2019-06-07 DIAGNOSIS — Z79899 Other long term (current) drug therapy: Secondary | ICD-10-CM | POA: Diagnosis not present

## 2019-06-07 DIAGNOSIS — M1710 Unilateral primary osteoarthritis, unspecified knee: Secondary | ICD-10-CM | POA: Diagnosis not present

## 2019-06-07 DIAGNOSIS — I872 Venous insufficiency (chronic) (peripheral): Secondary | ICD-10-CM | POA: Diagnosis not present

## 2019-06-07 HISTORY — PX: ATRIAL FIBRILLATION ABLATION: EP1191

## 2019-06-07 LAB — POCT ACTIVATED CLOTTING TIME: Activated Clotting Time: 279 seconds

## 2019-06-07 SURGERY — ATRIAL FIBRILLATION ABLATION
Anesthesia: General

## 2019-06-07 MED ORDER — SODIUM CHLORIDE 0.9 % IV SOLN: INTRAVENOUS | Status: DC

## 2019-06-07 MED ORDER — HEPARIN SODIUM (PORCINE) 1000 UNIT/ML IJ SOLN
INTRAMUSCULAR | Status: AC
  Filled 2019-06-07: qty 1

## 2019-06-07 MED ORDER — ISOPROTERENOL HCL 0.2 MG/ML IJ SOLN
INTRAVENOUS | Status: DC | PRN
  Administered 2019-06-07: 12:00:00 20 ug/min via INTRAVENOUS

## 2019-06-07 MED ORDER — HEPARIN (PORCINE) IN NACL 1000-0.9 UT/500ML-% IV SOLN
INTRAVENOUS | Status: DC | PRN
  Administered 2019-06-07: 500 mL

## 2019-06-07 MED ORDER — ONDANSETRON HCL 4 MG/2ML IJ SOLN: 4.0000 mg | Freq: Four times a day (QID) | INTRAMUSCULAR | Status: DC | PRN

## 2019-06-07 MED ORDER — ACETAMINOPHEN 325 MG PO TABS: 650.0000 mg | ORAL_TABLET | ORAL | Status: DC | PRN

## 2019-06-07 MED ORDER — HYDROCODONE-ACETAMINOPHEN 5-325 MG PO TABS: 1.0000 | ORAL_TABLET | ORAL | Status: DC | PRN

## 2019-06-07 MED ORDER — ISOPROTERENOL HCL 0.2 MG/ML IJ SOLN
INTRAMUSCULAR | Status: AC
  Filled 2019-06-07: qty 5

## 2019-06-07 MED ORDER — HEPARIN SODIUM (PORCINE) 1000 UNIT/ML IJ SOLN
INTRAMUSCULAR | Status: DC | PRN
  Administered 2019-06-07: 1000 [IU] via INTRAVENOUS

## 2019-06-07 MED ORDER — ROCURONIUM BROMIDE 50 MG/5ML IV SOSY
PREFILLED_SYRINGE | INTRAVENOUS | Status: DC | PRN
  Administered 2019-06-07: 50 mg via INTRAVENOUS

## 2019-06-07 MED ORDER — PHENYLEPHRINE HCL (PRESSORS) 10 MG/ML IV SOLN
INTRAVENOUS | Status: DC | PRN
  Administered 2019-06-07: 120 ug via INTRAVENOUS

## 2019-06-07 MED ORDER — PANTOPRAZOLE SODIUM 40 MG PO TBEC: 40.0000 mg | DELAYED_RELEASE_TABLET | Freq: Every day | ORAL | 0 refills | Status: DC

## 2019-06-07 MED ORDER — FENTANYL CITRATE (PF) 100 MCG/2ML IJ SOLN
INTRAMUSCULAR | Status: AC
  Filled 2019-06-07: qty 2

## 2019-06-07 MED ORDER — FENTANYL CITRATE (PF) 250 MCG/5ML IJ SOLN
INTRAMUSCULAR | Status: DC | PRN
  Administered 2019-06-07: 50 ug via INTRAVENOUS

## 2019-06-07 MED ORDER — SODIUM CHLORIDE 0.9 % IV SOLN: 250.0000 mL | INTRAVENOUS | Status: DC | PRN

## 2019-06-07 MED ORDER — HEPARIN SODIUM (PORCINE) 1000 UNIT/ML IJ SOLN
INTRAMUSCULAR | Status: DC | PRN
  Administered 2019-06-07: 14000 [IU] via INTRAVENOUS
  Administered 2019-06-07: 5000 [IU] via INTRAVENOUS

## 2019-06-07 MED ORDER — PROPOFOL 10 MG/ML IV BOLUS
INTRAVENOUS | Status: DC | PRN
  Administered 2019-06-07: 150 mg via INTRAVENOUS

## 2019-06-07 MED ORDER — FENTANYL CITRATE (PF) 100 MCG/2ML IJ SOLN
25.0000 ug | INTRAMUSCULAR | Status: DC | PRN
  Administered 2019-06-07: 13:00:00 25 ug via INTRAVENOUS
  Administered 2019-06-07: 13:00:00 50 ug via INTRAVENOUS
  Administered 2019-06-07: 13:00:00 25 ug via INTRAVENOUS

## 2019-06-07 MED ORDER — SODIUM CHLORIDE 0.9% FLUSH: 3.0000 mL | Freq: Two times a day (BID) | INTRAVENOUS | Status: DC

## 2019-06-07 MED ORDER — PROTAMINE SULFATE 10 MG/ML IV SOLN
INTRAVENOUS | Status: DC | PRN
  Administered 2019-06-07: 10 mg via INTRAVENOUS
  Administered 2019-06-07: 20 mg via INTRAVENOUS
  Administered 2019-06-07: 10 mg via INTRAVENOUS

## 2019-06-07 MED ORDER — ONDANSETRON HCL 4 MG/2ML IJ SOLN
INTRAMUSCULAR | Status: DC | PRN
  Administered 2019-06-07: 4 mg via INTRAVENOUS

## 2019-06-07 MED ORDER — SUCCINYLCHOLINE CHLORIDE 200 MG/10ML IV SOSY
PREFILLED_SYRINGE | INTRAVENOUS | Status: DC | PRN
  Administered 2019-06-07: 140 mg via INTRAVENOUS

## 2019-06-07 MED ORDER — SUGAMMADEX SODIUM 200 MG/2ML IV SOLN
INTRAVENOUS | Status: DC | PRN
  Administered 2019-06-07: 200 mg via INTRAVENOUS

## 2019-06-07 MED ORDER — SODIUM CHLORIDE 0.9% FLUSH: 3.0000 mL | INTRAVENOUS | Status: DC | PRN

## 2019-06-07 MED ORDER — LIDOCAINE 2% (20 MG/ML) 5 ML SYRINGE
INTRAMUSCULAR | Status: DC | PRN
  Administered 2019-06-07: 80 mg via INTRAVENOUS

## 2019-06-07 SURGICAL SUPPLY — 19 items
BLANKET WARM UNDERBOD FULL ACC (MISCELLANEOUS) ×3 IMPLANT
CATH MAPPNG PENTARAY F 2-6-2MM (CATHETERS) ×1 IMPLANT
CATH SMTCH THERMOCOOL SF DF (CATHETERS) ×3 IMPLANT
CATH SOUNDSTAR ECO 8FR (CATHETERS) ×3 IMPLANT
CATH WEBSTER BI DIR CS D-F CRV (CATHETERS) ×3 IMPLANT
COVER SWIFTLINK CONNECTOR (BAG) ×3 IMPLANT
DEVICE CLOSURE PERCLS PRGLD 6F (VASCULAR PRODUCTS) ×3 IMPLANT
NEEDLE BAYLIS TRANSSEPTAL 71CM (NEEDLE) ×3 IMPLANT
PACK EP LATEX FREE (CUSTOM PROCEDURE TRAY) ×2
PACK EP LF (CUSTOM PROCEDURE TRAY) ×1 IMPLANT
PAD PRO RADIOLUCENT 2001M-C (PAD) ×3 IMPLANT
PATCH CARTO3 (PAD) ×3 IMPLANT
PENTARAY F 2-6-2MM (CATHETERS) ×3
PERCLOSE PROGLIDE 6F (VASCULAR PRODUCTS) ×9
SHEATH PINNACLE 7F 10CM (SHEATH) ×6 IMPLANT
SHEATH PINNACLE 9F 10CM (SHEATH) ×3 IMPLANT
SHEATH PROBE COVER 6X72 (BAG) ×3 IMPLANT
SHEATH SWARTZ TS SL2 63CM 8.5F (SHEATH) ×3 IMPLANT
TUBING SMART ABLATE COOLFLOW (TUBING) ×3 IMPLANT

## 2019-06-07 NOTE — Discharge Instructions (Signed)
[place] THIS EVENING   Femoral Site Care This sheet gives you information about how to care for yourself after your procedure. Your health care provider may also give you more specific instructions. If you have problems or questions, contact your health care provider. What can I expect after the procedure? After the procedure, it is common to have:  Bruising that usually fades within 1-2 weeks.  Tenderness at the site. Follow these instructions at home: Wound care  Follow instructions from your health care provider about how to take care of your insertion site. Make sure you: ? Wash your hands with soap and water before you change your bandage (dressing). If soap and water are not available, use hand sanitizer. ? Change your dressing as told by your health care provider. ? Leave stitches (sutures), skin glue, or adhesive strips in place. These skin closures may need to stay in place for 2 weeks or longer. If adhesive strip edges start to loosen and curl up, you may trim the loose edges. Do not remove adhesive strips completely unless your health care provider tells you to do that.  Do not take baths, swim, or use a hot tub until your health care provider approves.  You may shower 24-48 hours after the procedure or as told by your health care provider. ? Gently wash the site with plain soap and water. ? Pat the area dry with a clean towel. ? Do not rub the site. This may cause bleeding.  Do not apply powder or lotion to the site. Keep the site clean and dry.  Check your femoral site every day for signs of infection. Check for: ? Redness, swelling, or pain. ? Fluid or blood. ? Warmth. ? Pus or a bad smell. Activity  For the first 2-3 days after your procedure, or as long as directed: ? Avoid climbing stairs as much as possible. ? Do not squat.  Do not lift anything that is heavier than 10 lb (4.5 kg), or the limit that you are told, until your health care provider says  that it is safe.  Rest as directed. ? Avoid sitting for a long time without moving. Get up to take short walks every 1-2 hours.  Do not drive for 24 hours if you were given a medicine to help you relax (sedative). General instructions  Take over-the-counter and prescription medicines only as told by your health care provider.  Keep all follow-up visits as told by your health care provider. This is important. Contact a health care provider if you have:  A fever or chills.  You have redness, swelling, or pain around your insertion site. Get help right away if:  The catheter insertion area swells very fast.  You pass out.  You suddenly start to sweat or your skin gets clammy.  The catheter insertion area is bleeding, and the bleeding does not stop when you hold steady pressure on the area.  The area near or just beyond the catheter insertion site becomes pale, cool, tingly, or numb. These symptoms may represent a serious problem that is an emergency. Do not wait to see if the symptoms will go away. Get medical help right away. Call your local emergency services (911 in the [place] Do not drive yourself to the hospital. Summary  After the procedure, it is common to have bruising that usually fades within 1-2 weeks.  Check your femoral site every day for signs of infection.  Do not lift anything that is heavier than  10 lb (4.5 kg), or the limit that you are told, until your health care provider says that it is safe. This information is not intended to replace advice given to you by your health care provider. Make sure you discuss any questions you have with your health care provider. Document Revised: 04/27/2017 Document Reviewed: 04/27/2017 Elsevier Patient Education  2020 [employer].

## 2019-06-07 NOTE — Transfer of Care (Signed)
Immediate Anesthesia Transfer of Care Note  Patient: Brooke Luna  Procedure(s) Performed: ATRIAL FIBRILLATION ABLATION (N/A )  Patient Location: Cath Lab  Anesthesia Type:General  Level of Consciousness: awake, alert , oriented, patient cooperative and responds to stimulation  Airway & Oxygen Therapy: Patient Spontanous Breathing and Patient connected to face mask oxygen  Post-op Assessment: Report given to RN and Post -op Vital signs reviewed and stable  Post vital signs: Reviewed and stable  Last Vitals:  Vitals Value Taken Time  BP 129/50 06/07/19 1239  Temp 36.2 C 06/07/19 1237  Pulse 79 06/07/19 1243  Resp 18 06/07/19 1243  SpO2 96 % 06/07/19 1243  Vitals shown include unvalidated device data.  Last Pain:  Vitals:   06/07/19 1237  TempSrc: Temporal  PainSc: 7       Patients Stated Pain Goal: 7 ([id number])  Complications: No apparent anesthesia complications

## 2019-06-07 NOTE — Progress Notes (Signed)
Dr Rayann Heman in to see client and ok to d/c home at 1800

## 2019-06-07 NOTE — Anesthesia Preprocedure Evaluation (Addendum)
Anesthesia Evaluation  Patient identified by MRN, date of birth, ID band Patient awake    Reviewed: Allergy & Precautions, NPO status   Airway Mallampati: II  TM Distance: >3 FB     Dental   Pulmonary former smoker,    breath sounds clear to auscultation       Cardiovascular + dysrhythmias Atrial Fibrillation  Rhythm:Irregular Rate:Normal     Neuro/Psych    GI/Hepatic Neg liver ROS, GERD  ,  Endo/Other  negative endocrine ROS  Renal/GU negative Renal ROS     Musculoskeletal   Abdominal   Peds  Hematology   Anesthesia Other Findings   Reproductive/Obstetrics                             Anesthesia Physical Anesthesia Plan  ASA: III  Anesthesia Plan: General   Post-op Pain Management:    Induction: Intravenous  PONV Risk Score and Plan: 3 and Ondansetron, Dexamethasone and Midazolam  Airway Management Planned: Oral ETT  Additional Equipment:   Intra-op Plan:   Post-operative Plan: Possible Post-op intubation/ventilation  Informed Consent: I have reviewed the patients History and Physical, chart, labs and discussed the procedure including the risks, benefits and alternatives for the proposed anesthesia with the patient or authorized representative who has indicated his/her understanding and acceptance.     Dental advisory given  Plan Discussed with: CRNA, Anesthesiologist and Surgeon  Anesthesia Plan Comments:        Anesthesia Quick Evaluation

## 2019-06-07 NOTE — Progress Notes (Signed)
Up and walked and tolerated well; right groin stable, no bleeding or hematoma 

## 2019-06-07 NOTE — Interval H&P Note (Signed)
History and Physical Interval Note:  06/07/2019 9:59 AM  Brooke Luna  has presented today for surgery, with the diagnosis of Atrial Fibrillation.  The various methods of treatment have been discussed with the patient and family. After consideration of risks, benefits and other options for treatment, the patient has consented to  Procedure(s): ATRIAL FIBRILLATION ABLATION (N/A) as a surgical intervention.  The patient's history has been reviewed, patient examined, no change in status, stable for surgery.  I have reviewed the patient's chart and labs.  Questions were answered to the patient's satisfaction.     Cardiac CT reviewed in detail with patient today.  She reports compliance with Rafael Hernandez therapy without interruption.  Thompson Grayer

## 2019-06-07 NOTE — Anesthesia Procedure Notes (Signed)
Procedure Name: Intubation Date/Time: 06/07/2019 10:30 AM Performed by: Glynda Jaeger, CRNA Pre-anesthesia Checklist: Patient identified, Patient being monitored, Timeout performed, Emergency Drugs available and Suction available Patient Re-evaluated:Patient Re-evaluated prior to induction Oxygen Delivery Method: Circle System Utilized Preoxygenation: Pre-oxygenation with 100% oxygen Induction Type: IV induction Laryngoscope Size: Glidescope and 4 Grade View: Grade I Tube type: Oral Tube size: 7.0 mm Number of attempts: 1 Airway Equipment and Method: Video-laryngoscopy Placement Confirmation: ETT inserted through vocal cords under direct vision,  positive ETCO2 and breath sounds checked- equal and bilateral Secured at: 21 cm Tube secured with: Tape Dental Injury: Teeth and Oropharynx as per pre-operative assessment

## 2019-06-07 NOTE — Anesthesia Postprocedure Evaluation (Signed)
Anesthesia Post Note  Patient: [name]  Procedure(s) Performed: ATRIAL FIBRILLATION ABLATION (N/A )     Patient location during evaluation: Endoscopy Anesthesia Type: General Level of consciousness: awake Pain management: pain level controlled Vital Signs Assessment: post-procedure vital signs reviewed and stable Respiratory status: spontaneous breathing Cardiovascular status: stable Postop Assessment: no apparent nausea or vomiting Anesthetic complications: no    Last Vitals:  Vitals:   06/07/19 1310 06/07/19 1315  BP: (!) 130/52 (!) 143/53  Pulse: 82 84  Resp: 14 13  Temp:  (!) 36.2 C  SpO2: 94% 95%    Last Pain:  Vitals:   06/07/19 1315  TempSrc: Temporal  PainSc: 6                  Tevita Gomer

## 2019-06-23 DIAGNOSIS — Z23 Encounter for immunization: Secondary | ICD-10-CM | POA: Diagnosis not present

## 2019-07-05 ENCOUNTER — Other Ambulatory Visit: Payer: Self-pay

## 2019-07-05 ENCOUNTER — Encounter (HOSPITAL_COMMUNITY): Payer: Self-pay | Admitting: Nurse Practitioner

## 2019-07-05 ENCOUNTER — Ambulatory Visit (HOSPITAL_COMMUNITY)
Admission: RE | Admit: 2019-07-05 | Discharge: 2019-07-05 | Disposition: A | Discharge status: Home / Self Care | Payer: Medicare Other | Source: Ambulatory Visit | Attending: Nurse Practitioner | Admitting: Nurse Practitioner

## 2019-07-05 VITALS — BP 130/66 | HR 66 | Ht 64.0 in | Wt 259.8 lb

## 2019-07-05 DIAGNOSIS — E669 Obesity, unspecified: Secondary | ICD-10-CM | POA: Diagnosis not present

## 2019-07-05 DIAGNOSIS — I10 Essential (primary) hypertension: Secondary | ICD-10-CM | POA: Diagnosis not present

## 2019-07-05 DIAGNOSIS — Z79899 Other long term (current) drug therapy: Secondary | ICD-10-CM | POA: Diagnosis not present

## 2019-07-05 DIAGNOSIS — Z87891 Personal history of nicotine dependence: Secondary | ICD-10-CM | POA: Insufficient documentation

## 2019-07-05 DIAGNOSIS — I2510 Atherosclerotic heart disease of native coronary artery without angina pectoris: Secondary | ICD-10-CM | POA: Diagnosis not present

## 2019-07-05 DIAGNOSIS — I480 Paroxysmal atrial fibrillation: Secondary | ICD-10-CM

## 2019-07-05 DIAGNOSIS — K219 Gastro-esophageal reflux disease without esophagitis: Secondary | ICD-10-CM | POA: Insufficient documentation

## 2019-07-05 DIAGNOSIS — D6869 Other thrombophilia: Secondary | ICD-10-CM

## 2019-07-05 DIAGNOSIS — K5790 Diverticulosis of intestine, part unspecified, without perforation or abscess without bleeding: Secondary | ICD-10-CM | POA: Insufficient documentation

## 2019-07-05 DIAGNOSIS — Z7901 Long term (current) use of anticoagulants: Secondary | ICD-10-CM | POA: Insufficient documentation

## 2019-07-05 NOTE — Progress Notes (Signed)
Primary Care Physician: Monico Blitz, MD Referring Physician: [facility] triage Cardiologist: Dr. Doran Durand Brooke Luna is a 74 y.o. female with a h/o HTN, CAD, obesity as well as  paroxsymal afib since 2016. She has been scheduled for a few cardioversions but always returns to [place] before the procedure.   She is on xarelto 20 mg qd for a chadsvasc score of at least 4.   She does not smoke or drink alcohol, minimum caffeine. She does snore. She states that she sleeps in a recliner because of her back and she doesn't breath well on her back.   She in in the afib clinic today as her afib burden has increased to the point that sh is now persistent x 3 weeks and wants options to explore how to mange going forward. AAD's were discussed but pt did not want to purse.  F/u in afib clinic, 05/03/19. She called to be seen for afib for the last 4-5 days. She has increased shortness of breath, and fatigue. She  would want an ablation. This would not be by guidelines to use AAD's first. She would not be an candidate for flecainide or Multaq  because of reduced  EF. She does not like the SE profile of amiodarone and Celexa may cause an issue with  qtc with Tikosyn or  Sotalol. She is not very willing to consider changing Celexa.  For the time being I will schedule cardioversion. She would like to skip antiarrythmic's and be considered for ablation. I will get her an appointment with EP after cardioversion. She is using 30 mg Cardizem every 4 hours to control v rates. She continues on xarelto for CHA2DS2VASc score of 4.   F/u in afib clinic,05/16/19. She had a successful cardioversion 05/09/19. She continues in SR and feels improved.   F/u in afib clinic, 07/05/19. She is one month s/p ablation and feels great. No afib to report.EKG shows SR. No groin or swallowing issues. Continues on xarelto 20 mg qd without interruption with a CHA2DS2VASc of 2.  Today, she denies symptoms of palpitations, chest  pain, shortness of breath, orthopnea, PND, lower extremity edema, dizziness, presyncope, syncope, or neurologic sequela.+ for fatigue , dyspnea, mild LLE. The patient is tolerating medications without difficulties and is otherwise without complaint today.   Past Medical History:  Diagnosis Date  . Arthritis    pain and oa  right knee and pain in rt leg and foot--also pain left knee-but right is worse  . Back pain    lumbar bulging disk  . Contact dermatitis    underside of left leg--always in the same spots--comes and goes  . Diverticulosis   . GERD (gastroesophageal reflux disease)    diet control  . Hemorrhoids   . Obesity   . Persistent atrial fibrillation [facility])    Past Surgical History:  Procedure Laterality Date  . ATRIAL FIBRILLATION ABLATION N/A 06/07/2019   Procedure: ATRIAL FIBRILLATION ABLATION;  Surgeon: Thompson Grayer, MD;  Location: [place] CV LAB;  Service: Cardiovascular;  Laterality: N/A;  . CARDIOVERSION N/A 05/09/2019   Procedure: CARDIOVERSION;  Surgeon: Buford Dresser, MD;  Location: [facility] ENDOSCOPY;  Service: Cardiovascular;  Laterality: N/A;  . CATARACT EXTRACTION W/PHACO Left 02/22/2015   Procedure: CATARACT EXTRACTION PHACO AND INTRAOCULAR LENS PLACEMENT LEFT EYE CDE=5.41;  Surgeon: Tonny Branch, MD;  Location: [facility];  Service: Ophthalmology;  Laterality: Left;  . CATARACT EXTRACTION W/PHACO Right 03/26/2015   Procedure: CATARACT EXTRACTION PHACO AND INTRAOCULAR  LENS PLACEMENT (IOC);  Surgeon: Tonny Branch, MD;  Location: [facility];  Service: Ophthalmology;  Laterality: Right;  CDE:5.01  . CHOLECYSTECTOMY    . surgery for broken right elbow Right   . TOTAL KNEE ARTHROPLASTY  11/03/2011   Procedure: TOTAL KNEE ARTHROPLASTY;  Surgeon: Gearlean Alf, MD;  [facility]: [facility];  Service: Orthopedics;  Laterality: Right;  . TUBAL LIGATION      Current Outpatient Medications  Medication Sig Dispense Refill  . citalopram (CELEXA) 20 MG tablet Take 20 mg by mouth at  bedtime.     . clonazePAM (KLONOPIN) 0.5 MG tablet Take 0.5 mg by mouth 2 (two) times daily as needed for anxiety.     Marland Kitchen diltiazem (CARDIZEM CD) 240 MG 24 hr capsule TAKE 1 CAPSULE BY MOUTH EVERY DAY (Patient taking differently: Take 240 mg by mouth at bedtime. ) 90 capsule 3  . diltiazem (CARDIZEM) 30 MG tablet TAKE 1 TABLET EVERY 8 HOURS AS NEEDED FOR PALPITATIONS (Patient taking differently: Take 30 mg by mouth every 8 (eight) hours as needed (palpitations). ) 90 tablet 1  . diphenhydrAMINE (BENADRYL) 25 MG tablet Take 25 mg by mouth daily as needed for allergies or sleep.     . diphenhydrAMINE-APAP, sleep, (EXCEDRIN PM) 38-500 MG TABS Take 1 tablet by mouth at bedtime as needed (sleep/pain).    . furosemide (LASIX) 20 MG tablet TAKE 1 TABLET BY MOUTH EVERY DAY (Patient taking differently: Take 20 mg by mouth at bedtime. ) 90 tablet 3  . Multiple Vitamin (MULTIVITAMIN WITH MINERALS) TABS tablet Take 1 tablet by mouth once a week.    . pantoprazole (PROTONIX) 40 MG tablet Take 1 tablet (40 mg total) by mouth daily. 45 tablet 0  . rivaroxaban (XARELTO) 20 MG TABS tablet Take 20 mg by mouth daily with supper.     No current facility-administered medications for this encounter.    Allergies  Allergen Reactions  . Codeine Shortness Of Breath and Itching  . Meloxicam Other (See Comments)    Stomach iritation     Social History   Socioeconomic History  . Marital status: Married    Spouse name: Not on file  . Number of children: Not on file  . Years of education: Not on file  . Highest education level: Not on file  Occupational History  . Not on file  Tobacco Use  . Smoking status: Former Smoker    Packs/day: 1.00    Years: 38.00    Pack years: 38.00    Start date: 06/25/1957    Quit date: 04/28/1996    Years since quitting: 23.2  . Smokeless tobacco: Never Used  . Tobacco comment: quit smoking 1998  Substance and Sexual Activity  . Alcohol use: Yes    Alcohol/week: 1.0 standard  drinks    Types: 1 Cans of beer per week  . Drug use: No  . Sexual activity: Not on file  Other Topics Concern  . Not on file  Social History Narrative   Lives in [place] with spouse.   Retired.   Social Determinants of Health   Financial Resource Strain:   . Difficulty of Paying Living Expenses: Not on file  Food Insecurity:   . Worried About Charity fundraiser in the Last Year: Not on file  . Ran Out of Food in the Last Year: Not on file  Transportation Needs:   . Lack of Transportation (Medical): Not on file  . Lack of Transportation (Non-Medical): Not on  file  Physical Activity:   . Days of Exercise per Week: Not on file  . Minutes of Exercise per Session: Not on file  Stress:   . Feeling of Stress : Not on file  Social Connections:   . Frequency of Communication with Friends and Family: Not on file  . Frequency of Social Gatherings with Friends and Family: Not on file  . Attends Religious Services: Not on file  . Active Member of Clubs or Organizations: Not on file  . Attends Archivist Meetings: Not on file  . Marital Status: Not on file  Intimate Partner Violence:   . Fear of Current or Ex-Partner: Not on file  . Emotionally Abused: Not on file  . Physically Abused: Not on file  . Sexually Abused: Not on file    Family History  Problem Relation Age of Onset  . Diabetes Mother   . Stroke Mother   . Stroke Father     ROS- All systems are reviewed and negative except as per the HPI above  Physical Exam: Vitals:   07/05/19 1101  BP: 130/66  Pulse: 66  Weight: 117.8 kg  Height: 5\' 4"  (1.626 m)   Wt Readings from Last 3 Encounters:  07/05/19 117.8 kg  06/07/19 117 kg  05/23/19 117.5 kg    Labs: Lab Results  Component Value Date   NA 140 06/03/2019   K 3.9 06/03/2019   CL 102 06/03/2019   CO2 29 06/03/2019   GLUCOSE 128 (H) 06/03/2019   BUN 11 06/03/2019   CREATININE 0.82 06/03/2019   CALCIUM 8.6 (L) 06/03/2019   Lab Results   Component Value Date   INR 0.95 10/23/2011   No results found for: CHOL, HDL, LDLCALC, TRIG   GEN- The patient is well appearing, alert and oriented x 3 today.   Head- normocephalic, atraumatic Eyes-  Sclera clear, conjunctiva pink Ears- hearing intact Oropharynx- clear Neck- supple, no JVP Lymph- no cervical lymphadenopathy Lungs- Clear to ausculation bilaterally, normal work of breathing Heart- regular rate and rhythm, no murmurs, rubs or gallops, PMI not laterally displaced GI- soft, NT, ND, + BS Extremities- no clubbing, cyanosis, left lower extremity edema worse than right(1+/trace)  MS- no significant deformity or atrophy Skin- no rash or lesion Psych- euthymic mood, full affect Neuro- strength and sensation are intact  EKG-  NSR at 66 bpm, pr int 166 ms, qrs int 92 ms,s sqtc 452 ms Epic records reviewed Echo- Study Conclusions 01/17/19- EF abnormal at 45-50%, LA chamber is 43 cm     Assessment and Plan: 1. Paroxysmal afib  S/p one month for ablation  She is doing well maintaining SR Continue daily cardizem 240 mg   2.CHA2DS2VASc score of 2 Continue xarelto for chadsvasc score of 4, reminded not to interrupt for the 3 month healing period following ablation    F/u Dr. Rayann Heman 5/10  Geroge Baseman. Latoyia Tecson, [facility] [address] [place], [place] [address] [phone]

## 2019-07-15 ENCOUNTER — Other Ambulatory Visit: Payer: Self-pay | Admitting: Internal Medicine

## 2019-07-22 DIAGNOSIS — Z23 Encounter for immunization: Secondary | ICD-10-CM | POA: Diagnosis not present

## 2019-09-05 ENCOUNTER — Telehealth (INDEPENDENT_AMBULATORY_CARE_PROVIDER_SITE_OTHER): Payer: Medicare Other | Admitting: Internal Medicine

## 2019-09-05 ENCOUNTER — Other Ambulatory Visit: Payer: Self-pay

## 2019-09-05 DIAGNOSIS — I4819 Other persistent atrial fibrillation: Secondary | ICD-10-CM | POA: Diagnosis not present

## 2019-09-05 DIAGNOSIS — D6869 Other thrombophilia: Secondary | ICD-10-CM | POA: Diagnosis not present

## 2019-09-05 NOTE — Progress Notes (Signed)
Electrophysiology TeleHealth Note  Due to national recommendations of social distancing due to [place] 19, an audio telehealth visit is felt to be most appropriate for this patient at this time.  Verbal consent was obtained by me for the telehealth visit today.  The patient does not have capability for a virtual visit.  A phone visit is therefore required today.   Date:  09/05/2019   ID:  Brooke Luna Tribune, DOB 1946/03/16, MRN [id number]  Location: patient's home  Provider location:  [place]  Evaluation Performed: Follow-up visit  PCP:  Monico Blitz, MD   Electrophysiologist:  Dr Rayann Heman  Chief Complaint:  palpitations  History of Present Illness:    Brooke Luna is a 74 y.o. female who presents via telehealth conferencing today.  Since last being seen in our clinic, the patient reports doing very well. She feels that her afib has been well controlled post ablation.  Denies procedure related complications and is pleased with results.  She reports feeling "a whole lot better" post ablation.  Exercise tolerance is much improved.  Today, she denies symptoms of palpitations, chest pain, shortness of breath,  lower extremity edema, dizziness, presyncope, or syncope.  The patient is otherwise without complaint today.     Past Medical History:  Diagnosis Date  . Arthritis    pain and oa  right knee and pain in rt leg and foot--also pain left knee-but right is worse  . Back pain    lumbar bulging disk  . Contact dermatitis    underside of left leg--always in the same spots--comes and goes  . Diverticulosis   . GERD (gastroesophageal reflux disease)    diet control  . Hemorrhoids   . Obesity   . Persistent atrial fibrillation [facility])     Past Surgical History:  Procedure Laterality Date  . ATRIAL FIBRILLATION ABLATION N/A 06/07/2019   Procedure: ATRIAL FIBRILLATION ABLATION;  Surgeon: Thompson Grayer, MD;  Location: [place] CV LAB;  Service: Cardiovascular;   Laterality: N/A;  . CARDIOVERSION N/A 05/09/2019   Procedure: CARDIOVERSION;  Surgeon: Buford Dresser, MD;  Location: [facility] ENDOSCOPY;  Service: Cardiovascular;  Laterality: N/A;  . CATARACT EXTRACTION W/PHACO Left 02/22/2015   Procedure: CATARACT EXTRACTION PHACO AND INTRAOCULAR LENS PLACEMENT LEFT EYE CDE=5.41;  Surgeon: Tonny Branch, MD;  Location: [facility];  Service: Ophthalmology;  Laterality: Left;  . CATARACT EXTRACTION W/PHACO Right 03/26/2015   Procedure: CATARACT EXTRACTION PHACO AND INTRAOCULAR LENS PLACEMENT (IOC);  Surgeon: Tonny Branch, MD;  Location: [facility];  Service: Ophthalmology;  Laterality: Right;  CDE:5.01  . CHOLECYSTECTOMY    . surgery for broken right elbow Right   . TOTAL KNEE ARTHROPLASTY  11/03/2011   Procedure: TOTAL KNEE ARTHROPLASTY;  Surgeon: Gearlean Alf, MD;  Location: [facility];  Service: Orthopedics;  Laterality: Right;  . TUBAL LIGATION      Current Outpatient Medications  Medication Sig Dispense Refill  . citalopram (CELEXA) 20 MG tablet Take 20 mg by mouth at bedtime.     . clonazePAM (KLONOPIN) 0.5 MG tablet Take 0.5 mg by mouth 2 (two) times daily as needed for anxiety.     Marland Kitchen diltiazem (CARDIZEM CD) 240 MG 24 hr capsule TAKE 1 CAPSULE BY MOUTH EVERY DAY (Patient taking differently: Take 240 mg by mouth at bedtime. ) 90 capsule 3  . diltiazem (CARDIZEM) 30 MG tablet TAKE 1 TABLET EVERY 8 HOURS AS NEEDED FOR PALPITATIONS (Patient taking differently: Take 30 mg by mouth every 8 (eight)  hours as needed (palpitations). ) 90 tablet 1  . diphenhydrAMINE (BENADRYL) 25 MG tablet Take 25 mg by mouth daily as needed for allergies or sleep.     . diphenhydrAMINE-APAP, sleep, (EXCEDRIN PM) 38-500 MG TABS Take 1 tablet by mouth at bedtime as needed (sleep/pain).    . furosemide (LASIX) 20 MG tablet TAKE 1 TABLET BY MOUTH EVERY DAY (Patient taking differently: Take 20 mg by mouth at bedtime. ) 90 tablet 3  . Multiple Vitamin (MULTIVITAMIN WITH MINERALS) TABS tablet Take 1  tablet by mouth once a week.    . pantoprazole (PROTONIX) 40 MG tablet TAKE 1 TABLET BY MOUTH EVERY DAY 30 tablet 8  . rivaroxaban (XARELTO) 20 MG TABS tablet Take 20 mg by mouth daily with supper.     No current facility-administered medications for this visit.    Allergies:   Codeine and Meloxicam   Social History:  The patient  reports that she quit smoking about 23 years ago. She started smoking about 62 years ago. She has a 38.00 pack-year smoking history. She has never used smokeless tobacco. She reports current alcohol use of about 1.0 standard drinks of alcohol per week. She reports that she does not use drugs.   ROS:  Please see the history of present illness.   All other systems are personally reviewed and negative.    Exam:    Vital Signs:  There were no vitals taken for this visit.  Well sounding, alert and conversant   Labs/Other Tests and Data Reviewed:    Recent Labs: 06/03/2019: BUN 11; Creatinine, Ser 0.82; Hemoglobin 14.1; Platelets 373; Potassium 3.9; Sodium 140   Wt Readings from Last 3 Encounters:  07/05/19 259 lb 12.8 oz (117.8 kg)  06/07/19 258 lb (117 kg)  05/23/19 259 lb (117.5 kg)      ASSESSMENT & PLAN:    1.  Persistent afib Doing very well post ablation off AAD therapy chads2vas score is 2.  Continue xarelto Consider reducing diltiazem on return  2. Obesity We discussed lifestyle modification again today   Risks, benefits and potential toxicities for medications prescribed and/or refilled reviewed with patient today.   Follow-up:  3 months with me   Patient Risk:  after full review of this patients clinical status, I feel that they are at moderate risk at this time.  Today, I have spent 15 minutes with the patient with telehealth technology discussing arrhythmia management .    Army Fossa, MD  09/05/2019 11:32 AM     [facility] [address] [place] [address] [phone]  (office) [phone] (fax)

## 2019-09-29 ENCOUNTER — Other Ambulatory Visit: Payer: Self-pay | Admitting: Cardiology

## 2019-10-03 ENCOUNTER — Other Ambulatory Visit: Payer: Self-pay

## 2019-10-03 ENCOUNTER — Ambulatory Visit (INDEPENDENT_AMBULATORY_CARE_PROVIDER_SITE_OTHER): Payer: Medicare Other | Admitting: Cardiology

## 2019-10-03 ENCOUNTER — Encounter: Payer: Self-pay | Admitting: Cardiology

## 2019-10-03 VITALS — BP 122/70 | HR 68 | Ht 64.0 in | Wt 261.4 lb

## 2019-10-03 DIAGNOSIS — R0602 Shortness of breath: Secondary | ICD-10-CM | POA: Diagnosis not present

## 2019-10-03 DIAGNOSIS — I10 Essential (primary) hypertension: Secondary | ICD-10-CM

## 2019-10-03 DIAGNOSIS — I4891 Unspecified atrial fibrillation: Secondary | ICD-10-CM

## 2019-10-03 NOTE — Progress Notes (Signed)
Clinical Summary Ms. Frankland is a 74 y.o.female seen today for follow up of the following medical problems.  1. PAF - diagnosed 12/2014 during hospital admission at [facility] - followed by Dr Rayann Heman, she is s/p ablation 05/2019  - no recent palpitations - compliant with meds.   2. HTN - compliant withmeds   3. CAD - nonobstructive disease by cath 2006according to prior clinic notes - admit 09/2016 to [facility] with chest painprimarily related to palpitations.  - no recent chest pain  - 05/2019 CT cardiac prior to ablation, no significant CAD   4. SOB - chronic stable SOB.  - DOE walking in from parking lot.  - remote tobacco history age 57 to age 32.    SH: had covid vaccine x 2 Past Medical History:  Diagnosis Date  . Arthritis    pain and oa  right knee and pain in rt leg and foot--also pain left knee-but right is worse  . Back pain    lumbar bulging disk  . Contact dermatitis    underside of left leg--always in the same spots--comes and goes  . Diverticulosis   . GERD (gastroesophageal reflux disease)    diet control  . Hemorrhoids   . Obesity   . Persistent atrial fibrillation (HCC)      Allergies  Allergen Reactions  . Codeine Shortness Of Breath and Itching  . Meloxicam Other (See Comments)    Stomach iritation      Current Outpatient Medications  Medication Sig Dispense Refill  . citalopram (CELEXA) 20 MG tablet Take 20 mg by mouth at bedtime.     . clonazePAM (KLONOPIN) 0.5 MG tablet Take 0.5 mg by mouth 2 (two) times daily as needed for anxiety.     Marland Kitchen diltiazem (CARDIZEM CD) 240 MG 24 hr capsule Take 1 capsule (240 mg total) by mouth at bedtime. 90 capsule 1  . diltiazem (CARDIZEM) 30 MG tablet TAKE 1 TABLET EVERY 8 HOURS AS NEEDED FOR PALPITATIONS (Patient taking differently: Take 30 mg by mouth every 8 (eight) hours as needed (palpitations). ) 90 tablet 1  . diphenhydrAMINE (BENADRYL) 25 MG tablet Take 25 mg by mouth daily  as needed for allergies or sleep.     . diphenhydrAMINE-APAP, sleep, (EXCEDRIN PM) 38-500 MG TABS Take 1 tablet by mouth at bedtime as needed (sleep/pain).    . furosemide (LASIX) 20 MG tablet TAKE 1 TABLET BY MOUTH EVERY DAY (Patient taking differently: Take 20 mg by mouth at bedtime. ) 90 tablet 3  . Multiple Vitamin (MULTIVITAMIN WITH MINERALS) TABS tablet Take 1 tablet by mouth once a week.    . pantoprazole (PROTONIX) 40 MG tablet TAKE 1 TABLET BY MOUTH EVERY DAY 30 tablet 8  . rivaroxaban (XARELTO) 20 MG TABS tablet Take 20 mg by mouth daily with supper.     No current facility-administered medications for this visit.     Past Surgical History:  Procedure Laterality Date  . ATRIAL FIBRILLATION ABLATION N/A 06/07/2019   Procedure: ATRIAL FIBRILLATION ABLATION;  Surgeon: Thompson Grayer, MD;  Location: [place] CV LAB;  Service: Cardiovascular;  Laterality: N/A;  . CARDIOVERSION N/A 05/09/2019   Procedure: CARDIOVERSION;  Surgeon: Buford Dresser, MD;  Location: [facility] ENDOSCOPY;  Service: Cardiovascular;  Laterality: N/A;  . CATARACT EXTRACTION W/PHACO Left 02/22/2015   Procedure: CATARACT EXTRACTION PHACO AND INTRAOCULAR LENS PLACEMENT LEFT EYE CDE=5.41;  Surgeon: Tonny Cornelius Schuitema, MD;  Location[facility];  Service: Ophthalmology;  Laterality: Left;  .  CATARACT EXTRACTION W/PHACO Right 03/26/2015   Procedure: CATARACT EXTRACTION PHACO AND INTRAOCULAR LENS PLACEMENT (IOC);  Surgeon: Tonny Marvelyn Bouchillon, MD;  Location: [facility];  Service: Ophthalmology;  Laterality: Right;  CDE:5.01  . CHOLECYSTECTOMY    . surgery for broken right elbow Right   . TOTAL KNEE ARTHROPLASTY  11/03/2011   Procedure: TOTAL KNEE ARTHROPLASTY;  Surgeon: Gearlean Alf, MD;  [facility]: [facility];  Service: Orthopedics;  Laterality: Right;  . TUBAL LIGATION       Allergies  Allergen Reactions  . Codeine Shortness Of Breath and Itching  . Meloxicam Other (See Comments)    Stomach iritation       Family History  Problem  Relation Age of Onset  . Diabetes Mother   . Stroke Mother   . Stroke Father      Social History Ms. Diliberto reports that she quit smoking about 23 years ago. She started smoking about 62 years ago. She has a 38.00 pack-year smoking history. She has never used smokeless tobacco. Ms. Matlin reports current alcohol use of about 1.0 standard drinks of alcohol per week.   Review of Systems CONSTITUTIONAL: No weight loss, fever, chills, weakness or fatigue.  HEENT: Eyes: No visual loss, blurred vision, double vision or yellow sclerae.No hearing loss, sneezing, congestion, runny nose or sore throat.  SKIN: No rash or itching.  CARDIOVASCULAR: per hpi RESPIRATORY: per hpi GASTROINTESTINAL: No anorexia, nausea, vomiting or diarrhea. No abdominal pain or blood.  GENITOURINARY: No burning on urination, no polyuria NEUROLOGICAL: No headache, dizziness, syncope, paralysis, ataxia, numbness or tingling in the extremities. No change in bowel or bladder control.  MUSCULOSKELETAL: No muscle, back pain, joint pain or stiffness.  LYMPHATICS: No enlarged nodes. No history of splenectomy.  PSYCHIATRIC: No history of depression or anxiety.  ENDOCRINOLOGIC: No reports of sweating, cold or heat intolerance. No polyuria or polydipsia.  Marland Kitchen   Physical Examination Today's Vitals   10/03/19 1252  BP: 122/70  Pulse: 68  SpO2: 91%  Weight: 261 lb 6.4 oz (118.6 kg)  Height: 5\' 4"  (1.626 m)   Body mass index is 44.87 kg/m.  Gen: resting comfortably, no acute distress HEENT: no scleral icterus, pupils equal round and reactive, no palptable cervical adenopathy,  CV: RRR, no m/r/g, no jvd Resp: Clear to auscultation bilaterally GI: abdomen is soft, non-tender, non-distended, normal bowel sounds, no hepatosplenomegaly MSK: extremities are warm, no edema.  Skin: warm, no rash Neuro:  no focal deficits Psych: appropriate affect   Diagnostic Studies 12/2016 sleep study IMPRESSIONS - Minimal obstructive  sleep apnea occurred during this study (AHI = 5.5/h) occurring primarily during supine sleep. - Mild central sleep apnea occurred during this study (CAI = 5.5/h). - The patient had minimal or no oxygen desaturation during the study (Min O2 = 100.00%) - The patient snored with Loud snoring volume. - EKG findings include Atrial Fibrillation. - Mild periodic limb movements of sleep occurred during the study. Associated arousals were significant.  DIAGNOSIS - Obstructive Sleep Apnea (327.23 [G47.33 ICD-10])  RECOMMENDATIONS - Very mild obstructive sleep apnea occurring primarily in supine position. - Recommend avoiding sleeping supine.  - Avoid alcohol, sedatives and other CNS depressants that may worsen sleep apnea and disrupt normal sleep architecture. - Sleep hygiene should be reviewed to assess factors that may improve sleep quality. - Weight management and regular exercise should be initiated or continued if appropriate. - Consider referral to ENT for evaluation of possible surgical causes of snoring.  11/2016 echo Study Conclusions  -  Left ventricle: The cavity size was normal. Wall thickness was normal. Systolic function was normal. The estimated ejection fraction was in the range of 60% to 65%. Left ventricular diastolic function parameters were normal. - Left atrium: The atrium was mildly to moderately dilated.    Assessment and Plan  1. PAF -continues to do well s/p ablation - continue current med  2. HTN - at goal,continue current meds  3.SOB - last echo with pcp low normal to mildly decreased LVEF.  - repeat echo - recent cardiac CT for ablation showed no significant CAD - if benign echo, consider PFTs given prior smoking history, risks for obesity hypoventilation - there is a possibility symptoms alone are related to deconditioning and weight, but would look to exclude any other pathology  F/u 6 months    Arnoldo Lenis, M.D.

## 2019-10-03 NOTE — Patient Instructions (Signed)
Your physician wants you to follow-up in: 6 MONTHS WITH DR. BRANCH You will receive a reminder letter in the mail two months in advance. If you don't receive a letter, please call our office to schedule the follow-up appointment.  Your physician recommends that you continue on your current medications as directed. Please refer to the Current Medication list given to you today.  Your physician has requested that you have an echocardiogram. Echocardiography is a painless test that uses sound waves to create images of your heart. It provides your doctor with information about the size and shape of your heart and how well your heart's chambers and valves are working. This procedure takes approximately one hour. There are no restrictions for this procedure.  Thank you for choosing [facility]!!   

## 2019-10-25 ENCOUNTER — Ambulatory Visit (INDEPENDENT_AMBULATORY_CARE_PROVIDER_SITE_OTHER): Payer: Medicare Other

## 2019-10-25 DIAGNOSIS — I4891 Unspecified atrial fibrillation: Secondary | ICD-10-CM | POA: Diagnosis not present

## 2019-11-04 ENCOUNTER — Telehealth: Payer: Self-pay | Admitting: *Deleted

## 2019-11-04 DIAGNOSIS — R0602 Shortness of breath: Secondary | ICD-10-CM

## 2019-11-04 NOTE — Telephone Encounter (Signed)
-----   Message from Arnoldo Lenis, MD sent at 11/02/2019  9:39 AM EDT ----- Echo looks good, heart function has actually improved since last check and now is well within normal range. Can we order PFTs for her SOB  Zandra Abts MD

## 2019-11-04 NOTE — Telephone Encounter (Signed)
Pt aware and agreeable to PFTs - orders placed and will forward to schedulers

## 2019-11-10 DIAGNOSIS — I480 Paroxysmal atrial fibrillation: Secondary | ICD-10-CM | POA: Diagnosis not present

## 2019-11-10 DIAGNOSIS — J019 Acute sinusitis, unspecified: Secondary | ICD-10-CM | POA: Diagnosis not present

## 2019-11-10 DIAGNOSIS — I4891 Unspecified atrial fibrillation: Secondary | ICD-10-CM | POA: Diagnosis not present

## 2019-11-10 DIAGNOSIS — Z87891 Personal history of nicotine dependence: Secondary | ICD-10-CM | POA: Diagnosis not present

## 2019-11-10 DIAGNOSIS — Z6841 Body Mass Index (BMI) 40.0 and over, adult: Secondary | ICD-10-CM | POA: Diagnosis not present

## 2019-11-10 DIAGNOSIS — I10 Essential (primary) hypertension: Secondary | ICD-10-CM | POA: Diagnosis not present

## 2019-11-10 DIAGNOSIS — Z299 Encounter for prophylactic measures, unspecified: Secondary | ICD-10-CM | POA: Diagnosis not present

## 2019-12-15 ENCOUNTER — Ambulatory Visit: Payer: Medicare Other | Admitting: Internal Medicine

## 2019-12-16 ENCOUNTER — Encounter (HOSPITAL_COMMUNITY): Payer: Self-pay | Admitting: Emergency Medicine

## 2019-12-16 ENCOUNTER — Observation Stay (HOSPITAL_COMMUNITY)
Admission: EM | Admit: 2019-12-16 | Discharge: 2019-12-17 | Disposition: A | Discharge status: Home / Self Care | Payer: Medicare Other | Attending: Family Medicine | Admitting: Family Medicine

## 2019-12-16 ENCOUNTER — Emergency Department (HOSPITAL_COMMUNITY): Payer: Medicare Other

## 2019-12-16 ENCOUNTER — Other Ambulatory Visit: Payer: Self-pay

## 2019-12-16 ENCOUNTER — Telehealth: Payer: Self-pay | Admitting: Internal Medicine

## 2019-12-16 ENCOUNTER — Ambulatory Visit (INDEPENDENT_AMBULATORY_CARE_PROVIDER_SITE_OTHER): Payer: Medicare Other | Admitting: *Deleted

## 2019-12-16 VITALS — BP 86/50 | HR 166 | Ht 64.0 in | Wt 261.0 lb

## 2019-12-16 DIAGNOSIS — I4891 Unspecified atrial fibrillation: Secondary | ICD-10-CM

## 2019-12-16 DIAGNOSIS — U071 COVID-19: Principal | ICD-10-CM | POA: Insufficient documentation

## 2019-12-16 DIAGNOSIS — R002 Palpitations: Secondary | ICD-10-CM | POA: Diagnosis present

## 2019-12-16 DIAGNOSIS — Z96651 Presence of right artificial knee joint: Secondary | ICD-10-CM | POA: Insufficient documentation

## 2019-12-16 DIAGNOSIS — I2583 Coronary atherosclerosis due to lipid rich plaque: Secondary | ICD-10-CM | POA: Diagnosis not present

## 2019-12-16 DIAGNOSIS — Z87891 Personal history of nicotine dependence: Secondary | ICD-10-CM | POA: Diagnosis not present

## 2019-12-16 DIAGNOSIS — I872 Venous insufficiency (chronic) (peripheral): Secondary | ICD-10-CM | POA: Diagnosis not present

## 2019-12-16 DIAGNOSIS — I119 Hypertensive heart disease without heart failure: Secondary | ICD-10-CM | POA: Insufficient documentation

## 2019-12-16 DIAGNOSIS — I2510 Atherosclerotic heart disease of native coronary artery without angina pectoris: Secondary | ICD-10-CM | POA: Diagnosis not present

## 2019-12-16 DIAGNOSIS — Z79899 Other long term (current) drug therapy: Secondary | ICD-10-CM | POA: Diagnosis not present

## 2019-12-16 DIAGNOSIS — I10 Essential (primary) hypertension: Secondary | ICD-10-CM | POA: Diagnosis not present

## 2019-12-16 DIAGNOSIS — I4819 Other persistent atrial fibrillation: Secondary | ICD-10-CM | POA: Diagnosis present

## 2019-12-16 LAB — TROPONIN I (HIGH SENSITIVITY)
Troponin I (High Sensitivity): 14 ng/L (ref <18)
Troponin I (High Sensitivity): 14 ng/L (ref <18)

## 2019-12-16 LAB — BASIC METABOLIC PANEL
Anion gap: 10 (ref 5–15)
BUN: 11 mg/dL (ref 8–23)
CO2: 27 mmol/L (ref 22–32)
Calcium: 8.9 mg/dL (ref 8.9–10.3)
Chloride: 100 mmol/L (ref 98–111)
Creatinine, Ser: 0.85 mg/dL (ref 0.44–1.00)
GFR calc Af Amer: >60 mL/min (ref >60)
GFR calc non Af Amer: >60 mL/min (ref >60)
Glucose, Bld: 111 mg/dL — ABNORMAL HIGH (ref 70–99)
Potassium: 3.8 mmol/L (ref 3.5–5.1)
Sodium: 137 mmol/L (ref 135–145)

## 2019-12-16 LAB — CBC
HCT: 43.2 % (ref 36.0–46.0)
Hemoglobin: 13.5 g/dL (ref 12.0–15.0)
MCH: 27.7 pg (ref 26.0–34.0)
MCHC: 31.3 g/dL (ref 30.0–36.0)
MCV: 88.5 fL (ref 80.0–100.0)
Platelets: 433 10*3/uL — ABNORMAL HIGH (ref 150–400)
RBC: 4.88 MIL/uL (ref 3.87–5.11)
RDW: 15.4 % (ref 11.5–15.5)
WBC: 12.4 10*3/uL — ABNORMAL HIGH (ref 4.0–10.5)
nRBC: 0 % (ref 0.0–0.2)

## 2019-12-16 LAB — TSH: TSH: 4.207 u[IU]/mL (ref 0.350–4.500)

## 2019-12-16 LAB — MAGNESIUM: Magnesium: 2.1 mg/dL (ref 1.7–2.4)

## 2019-12-16 LAB — SARS CORONAVIRUS 2 BY RT PCR (HOSPITAL ORDER, PERFORMED IN ~~LOC~~ HOSPITAL LAB): SARS Coronavirus 2: POSITIVE — AB

## 2019-12-16 MED ORDER — CITALOPRAM HYDROBROMIDE 20 MG PO TABS
20.0000 mg | ORAL_TABLET | Freq: Every day | ORAL | Status: DC
  Administered 2019-12-16: 20 mg via ORAL
  Filled 2019-12-16 (×4): qty 1

## 2019-12-16 MED ORDER — CLONAZEPAM 0.5 MG PO TABS: 0.5000 mg | ORAL_TABLET | Freq: Two times a day (BID) | ORAL | Status: DC | PRN

## 2019-12-16 MED ORDER — DIPHENHYDRAMINE HCL 25 MG PO TABS
25.0000 mg | ORAL_TABLET | Freq: Every day | ORAL | Status: DC | PRN
  Filled 2019-12-16: qty 1

## 2019-12-16 MED ORDER — DILTIAZEM HCL 25 MG/5ML IV SOLN: 10.0000 mg | Freq: Once | INTRAVENOUS | Status: DC

## 2019-12-16 MED ORDER — SODIUM CHLORIDE 0.9 % IV BOLUS
1000.0000 mL | Freq: Once | INTRAVENOUS | Status: AC
  Administered 2019-12-16: 1000 mL via INTRAVENOUS

## 2019-12-16 MED ORDER — ADENOSINE 6 MG/2ML IV SOLN
INTRAVENOUS | Status: AC
  Filled 2019-12-16: qty 2

## 2019-12-16 MED ORDER — DIGOXIN 0.25 MG/ML IJ SOLN
0.2500 mg | Freq: Once | INTRAMUSCULAR | Status: AC
  Administered 2019-12-16: 0.25 mg via INTRAVENOUS

## 2019-12-16 MED ORDER — METOPROLOL TARTRATE 25 MG PO TABS
25.0000 mg | ORAL_TABLET | Freq: Four times a day (QID) | ORAL | Status: DC
  Administered 2019-12-16 – 2019-12-17 (×2): 25 mg via ORAL
  Filled 2019-12-16 (×2): qty 1

## 2019-12-16 MED ORDER — DILTIAZEM HCL-DEXTROSE 125-5 MG/125ML-% IV SOLN (PREMIX)
5.0000 mg/h | INTRAVENOUS | Status: AC
  Administered 2019-12-16: 5 mg/h via INTRAVENOUS
  Administered 2019-12-16: 10 mg/h via INTRAVENOUS
  Filled 2019-12-16: qty 125

## 2019-12-16 MED ORDER — POTASSIUM CHLORIDE CRYS ER 20 MEQ PO TBCR
40.0000 meq | EXTENDED_RELEASE_TABLET | Freq: Once | ORAL | Status: AC
  Administered 2019-12-16: 40 meq via ORAL
  Filled 2019-12-16: qty 2

## 2019-12-16 MED ORDER — DILTIAZEM HCL 25 MG/5ML IV SOLN
10.0000 mg | Freq: Once | INTRAVENOUS | Status: AC
  Administered 2019-12-16: 10 mg via INTRAVENOUS
  Filled 2019-12-16: qty 5

## 2019-12-16 MED ORDER — ADENOSINE 6 MG/2ML IV SOLN: 6.0000 mg | Freq: Once | INTRAVENOUS | Status: AC

## 2019-12-16 MED ORDER — RIVAROXABAN 20 MG PO TABS: 20.0000 mg | ORAL_TABLET | Freq: Every day | ORAL | Status: DC

## 2019-12-16 MED ORDER — ADULT MULTIVITAMIN W/MINERALS CH
1.0000 | ORAL_TABLET | ORAL | Status: DC
  Administered 2019-12-16: 1 via ORAL
  Filled 2019-12-16: qty 1

## 2019-12-16 MED ORDER — ADENOSINE 6 MG/2ML IV SOLN
INTRAVENOUS | Status: AC
  Administered 2019-12-16: 6 mg via INTRAVENOUS
  Filled 2019-12-16: qty 8

## 2019-12-16 NOTE — Progress Notes (Signed)
Presents to office for EKG per recent phone note with c/o elevated HR. EKG done and HR 164 by EKG. Vitals taken. Apical pulse fast. Took prn diltiazem 30 mg by mouth while in office. Reports taking 30 mg diltiazem x's 1 on yesterday and brought HR from 165 down to 120. Denies chest pain, dizziness or SOB. Denies palpitations or flutter sensation. Medications reviewed and reviewed by Dr. Harl Bowie.

## 2019-12-16 NOTE — Telephone Encounter (Signed)
New message     STAT if HR is under 50 or over 120 (normal HR is 60-100 beats per minute)  1) What is your heart rate? 150-167 for a week now   2) Do you have a log of your heart rate readings (document readings)? no  Do you have any other symptoms? She is having problems breathing

## 2019-12-16 NOTE — ED Provider Notes (Signed)
[facility] Provider Note   CSN: [id number] Arrival date & time: 12/16/19  1536     History Chief Complaint  Patient presents with  . Atrial Fibrillation    Brooke Luna is a 74 y.o. female.  Patient has been having palpitations for a week.  She has a history of atrial fib  The history is provided by the patient and medical records. No language interpreter was used.  Atrial Fibrillation This is a recurrent problem. The problem occurs constantly. The problem has not changed since onset.Pertinent negatives include no chest pain, no abdominal pain and no headaches. Nothing aggravates the symptoms. Nothing relieves the symptoms. She has tried nothing for the symptoms. The treatment provided no relief.       Past Medical History:  Diagnosis Date  . Arthritis    pain and oa  right knee and pain in rt leg and foot--also pain left knee-but right is worse  . Atrial fibrillation ([place])   . Back pain    lumbar bulging disk  . Contact dermatitis    underside of left leg--always in the same spots--comes and goes  . Diverticulosis   . GERD (gastroesophageal reflux disease)    diet control  . Hemorrhoids   . Obesity   . Persistent atrial fibrillation [name])     Patient Active Problem List   Diagnosis Date Noted  . Atrial fibrillation with RVR ([place]) 12/16/2019  . Atrial fibrillation ([place])   . Varicose veins of lower extremities with other complications 90/24/0973  . Chronic venous insufficiency 12/17/2012  . Postop Hyponatremia 11/05/2011  . OA (osteoarthritis) of knee 11/03/2011    Past Surgical History:  Procedure Laterality Date  . ATRIAL FIBRILLATION ABLATION N/A 06/07/2019   Procedure: ATRIAL FIBRILLATION ABLATION;  Surgeon: Thompson Grayer, MD;  Location: [facility] CV LAB;  Service: Cardiovascular;  Laterality: N/A;  . CARDIOVERSION N/A 05/09/2019   Procedure: CARDIOVERSION;  Surgeon: Buford Dresser, MD;  Location: [facility] ENDOSCOPY;   Service: Cardiovascular;  Laterality: N/A;  . CATARACT EXTRACTION W/PHACO Left 02/22/2015   Procedure: CATARACT EXTRACTION PHACO AND INTRAOCULAR LENS PLACEMENT LEFT EYE CDE=5.41;  Surgeon: Tonny Branch, MD;  Location: [facility] ORS;  Service: Ophthalmology;  Laterality: Left;  . CATARACT EXTRACTION W/PHACO Right 03/26/2015   Procedure: CATARACT EXTRACTION PHACO AND INTRAOCULAR LENS PLACEMENT (IOC);  Surgeon: Tonny Branch, MD;  Location: [facility] ORS;  Service: Ophthalmology;  Laterality: Right;  CDE:5.01  . CHOLECYSTECTOMY    . surgery for broken right elbow Right   . TOTAL KNEE ARTHROPLASTY  11/03/2011   Procedure: TOTAL KNEE ARTHROPLASTY;  Surgeon: Gearlean Alf, MD;  Location: [facility] ORS;  Service: Orthopedics;  Laterality: Right;  . TUBAL LIGATION       OB History   No obstetric history on file.     Family History  Problem Relation Age of Onset  . Diabetes Mother   . Stroke Mother   . Stroke Father     Social History   Tobacco Use  . Smoking status: Former Smoker    Packs/day: 1.00    Years: 38.00    Pack years: 38.00    Start date: 06/25/1957    Quit date: 04/28/1996    Years since quitting: 23.6  . Smokeless tobacco: Never Used  . Tobacco comment: quit smoking 1998  Vaping Use  . Vaping Use: Never used  Substance Use Topics  . Alcohol use: Yes    Alcohol/week: 1.0 standard drink    Types: 1 Cans of  beer per week  . Drug use: No    Home Medications Prior to Admission medications   Medication Sig Start Date End Date Taking? Authorizing Provider  citalopram (CELEXA) 20 MG tablet Take 20 mg by mouth at bedtime.  10/28/12   [provider]  clonazePAM (KLONOPIN) 0.5 MG tablet Take 0.5 mg by mouth 2 (two) times daily as needed for anxiety.     [provider]  diltiazem (CARDIZEM CD) 240 MG 24 hr capsule Take 1 capsule (240 mg total) by mouth at bedtime. 09/29/19   Arnoldo Lenis, MD  diltiazem (CARDIZEM) 30 MG tablet TAKE 1 TABLET EVERY 8 HOURS AS NEEDED FOR  PALPITATIONS Patient taking differently: Take 30 mg by mouth every 8 (eight) hours as needed (palpitations).  02/22/18   Arnoldo Lenis, MD  diphenhydrAMINE (BENADRYL) 25 MG tablet Take 25 mg by mouth daily as needed for allergies or sleep.     [provider]  diphenhydrAMINE-APAP, sleep, (EXCEDRIN PM) 38-500 MG TABS Take 1 tablet by mouth at bedtime as needed (sleep/pain).    [provider]  furosemide (LASIX) 20 MG tablet TAKE 1 TABLET BY MOUTH EVERY DAY Patient taking differently: Take 20 mg by mouth at bedtime.  04/19/19   Arnoldo Lenis, MD  Multiple Vitamin (MULTIVITAMIN WITH MINERALS) TABS tablet Take 1 tablet by mouth once a week.    [provider]  pantoprazole (PROTONIX) 40 MG tablet TAKE 1 TABLET BY MOUTH EVERY DAY 07/15/19   Allred, Jeneen Rinks, MD  rivaroxaban (XARELTO) 20 MG TABS tablet Take 20 mg by mouth daily with supper.    [provider]    Allergies    Codeine and Meloxicam  Review of Systems   Review of Systems  Constitutional: Negative for appetite change and fatigue.  HENT: Negative for congestion, ear discharge and sinus pressure.   Eyes: Negative for discharge.  Respiratory: Negative for cough.   Cardiovascular: Positive for palpitations. Negative for chest pain.  Gastrointestinal: Negative for abdominal pain and diarrhea.  Genitourinary: Negative for frequency and hematuria.  Musculoskeletal: Negative for back pain.  Skin: Negative for rash.  Neurological: Negative for seizures and headaches.  Psychiatric/Behavioral: Negative for hallucinations.    Physical Exam Updated Vital Signs BP 116/90   Pulse (!) 158   Temp 98.1 F (36.7 C) (Oral)   Resp (!) 21   Ht 5\' 5"  (1.651 m)   Wt 115.7 kg   SpO2 94%   BMI 42.43 kg/m   Physical Exam Vitals and nursing note reviewed.  Constitutional:      Appearance: She is well-developed.  HENT:     Head: Normocephalic.     Nose: Nose normal.  Eyes:     General: No  scleral icterus.    Conjunctiva/sclera: Conjunctivae normal.  Neck:     Thyroid: No thyromegaly.  Cardiovascular:     Rate and Rhythm: Tachycardia present. Rhythm irregular.     Heart sounds: No murmur heard.  No friction rub. No gallop.   Pulmonary:     Breath sounds: No stridor. No wheezing or rales.  Chest:     Chest wall: No tenderness.  Abdominal:     General: There is no distension.     Tenderness: There is no abdominal tenderness. There is no rebound.  Musculoskeletal:        General: Normal range of motion.     Cervical back: Neck supple.  Lymphadenopathy:     Cervical: No cervical adenopathy.  Skin:  Findings: No erythema or rash.  Neurological:     Mental Status: She is alert and oriented to person, place, and time.     Motor: No abnormal muscle tone.     Coordination: Coordination normal.  Psychiatric:        Behavior: Behavior normal.     ED Results / Procedures / Treatments   Labs (all labs ordered are listed, but only abnormal results are displayed) Labs Reviewed  BASIC METABOLIC PANEL - Abnormal; Notable for the following components:      Result Value   Glucose, Bld 111 (*)    All other components within normal limits  CBC - Abnormal; Notable for the following components:   WBC 12.4 (*)    Platelets 433 (*)    All other components within normal limits  SARS CORONAVIRUS 2 BY RT PCR (HOSPITAL ORDER, [facility])  TROPONIN I (HIGH SENSITIVITY)  TROPONIN I (HIGH SENSITIVITY)    EKG None  Radiology DG Chest Port 1 View  Result Date: 12/16/2019 CLINICAL DATA:  Atrial fibrillation with rapid ventricular response EXAM: PORTABLE CHEST 1 VIEW COMPARISON:  10/11/2017 FINDINGS: Single frontal view of the chest demonstrates an unremarkable cardiac silhouette. No airspace disease, effusion, or pneumothorax. No acute bony abnormalities. IMPRESSION: 1. No acute intrathoracic process. Electronically Signed   By: Randa Ngo M.D.   On:  12/16/2019 18:42    Procedures Procedures (including critical care time)  Medications Ordered in ED Medications  diltiazem (CARDIZEM) 125 mg in dextrose 5% 125 mL (1 mg/mL) infusion (10 mg/hr Intravenous Rate/Dose Change 12/16/19 1845)  adenosine (ADENOCARD) 6 MG/2ML injection 6 mg ( Intravenous Not Given 12/16/19 1755)  sodium chloride 0.9 % bolus 1,000 mL (0 mLs Intravenous Stopped 12/16/19 1854)  diltiazem (CARDIZEM) injection 10 mg (10 mg Intravenous Given 12/16/19 1819)    ED Course  I have reviewed the triage vital signs and the nursing notes.  Pertinent labs & imaging results that were available during my care of the patient were reviewed by me and considered in my medical decision making (see chart for details). Mar[facility] KitchenEDCR CRITICAL CARE Performed by: Milton Ferguson Total critical care time: 40 minutes Critical care time was exclusive of separately billable procedures and treating other patients. Critical care was necessary to treat or prevent imminent or life-threatening deterioration. Critical care was time spent personally by me on the following activities: development of treatment plan with patient and/or surrogate as well as nursing, discussions with consultants, evaluation of patient's response to treatment, examination of patient, obtaining history from patient or surrogate, ordering and performing treatments and interventions, ordering and review of laboratory studies, ordering and review of radiographic studies, pulse oximetry and re-evaluation of patient's condition.    MDM Rules/Calculators/A&P                         Patient with rapid atrial fib.  Initially she was given a adenosine because the rate looked somewhat regular.  Slowed her heart rate down for just couple seconds and it came back with a rapid A. fib.  Patient was put on Cardizem and a drip and her rate has continued A. fib around 120      This patient presents to the ED for concern of palpitations this  involves an extensive number of treatment options, and is a complaint that carries with it a high risk of complications and morbidity.  The differential diagnosis includes MI atrial failed  Lab Tests:   I Ordered, reviewed, and interpreted labs, which included CBC chemistries troponin showed elevated white count  Medicines ordered:   I ordered medication Cardizem  Imaging Studies ordered:   I ordered imaging studies which included chest x-ray  I independently visualized and interpreted imaging which showed no acute disease  Additional history obtained:   Additional history obtained from record  Previous records obtained and reviewed.  Consultations Obtained:   I consulted hospitalist and discussed lab and imaging findings  Reevaluation:  After the interventions stated above, I reevaluated the patient and found mild improvement  Critical Interventions:  .   Final Clinical Impression(s) / ED Diagnoses Final diagnoses:  None    Rx / DC Orders ED Discharge Orders    None       Milton Ferguson, MD 12/19/19 1037

## 2019-12-16 NOTE — ED Notes (Signed)
Date and time results received: 12/16/19 2120   Test: Covid Critical Value: Positive  Name of Provider Notified: Dr Trellis Moment  Orders Received? Or Actions Taken? See orders

## 2019-12-16 NOTE — Telephone Encounter (Signed)
Reports HR has been elevated since last Saturday 12/10/2019 checked by BP monitor and pulse oximeter ranging 120-170 and checked randomly. Reports that she does not feel symptoms of a fast heart rate when her monitors show elevated HR. Reports feeling heart racing some times but not all the time. Denies active chest pain or heart racing. Denies dizziness, Reports SOB that she's had for awhile and is unchanged is currently being evaluated for this by another provider. This morning BP 111/71 & HR 160. Reports she did not feel her heart beating fast this morning when BP monitor showed HR at 160. Advised that when you have an abnormal heart rhythm, the digital monitors don't always show an accurate reading for HR or BP. Advised to come to the office today for a nurse visit to have an ekg with her home digital monitors. Verbalized understanding of plan.

## 2019-12-16 NOTE — ED Triage Notes (Signed)
Pt reports she has been in A-Fib x 1 week; saw cardiologist in office today and was sent to ER

## 2019-12-16 NOTE — Telephone Encounter (Signed)
Use mobile number instead of home number

## 2019-12-16 NOTE — Progress Notes (Signed)
Per Dr. Harl Bowie, needs to go to ER, no meds that we could adjust with that low of blood pressure.  Discussed with patient that ER has been recommended by Dr. Harl Bowie but patient refuses stating she has to take care of her husband and has no one else to care for him. Says she is willing to be seen in the [facility]-Fib Clinic next week. Advised that ED would be the best option for treatment. Says she would think about going to [facility] ED but wanted to ask daughter if she could take care of husband while she's away. Offered non-emergency EMS transport but patient declined. A-fib Clinic contacted and appointment made for Monday, 12/19/19 @11 :30 am with Roderic Palau. Discussed with patient that if she develops symptoms, she needed to call EMS or go to the ED immediately. Verbalized understanding of plan.

## 2019-12-16 NOTE — Patient Instructions (Signed)
Advised to go to ED for an evaluation A-fib Clinic appointment scheduled for 12/19/19 @11 :30 am with Roderic Palau.

## 2019-12-16 NOTE — H&P (Addendum)
TRH H&P   Patient Demographics:    Brooke Luna, is a 74 y.o. female  MRN: [id number]   DOB - 09/19/1945  Admit Date - 12/16/2019  Outpatient Primary MD for the patient is Monico Blitz, MD  Referring MD/NP/PA: Dr Roderic Palau  Outpatient Specialists:  Cardiology Dr Harl Bowie  Patient coming from: Home  Chief Complaint  Patient presents with  . Atrial Fibrillation      HPI:    Brooke Luna  is a 74 y.o. female, history of paroxysmal A. fib, on Cardizem CD and Xarelto, status post ablation by Dr. Rayann Heman and February 2021, hypertension, CAD, patient presents to ED secondary to palpitation and accelerated heart rate, she was recommended by cardiology by phone to come to ED, patient reports she is compliant with her Cardizem CD, and has been taking as needed Cardizem as well, she has been feeling palpitation, reports averaging heart rate at home in the 160s, with blood pressure being in the 110's, she does report dyspnea at baseline, she denies any chest pain, lightheadedness or dizziness, but reports heart racing, and palpitation, she denies any caffeine use, patient has been hesitant to come given she is taking care of her sick husband at home, but symptoms has lasted for more than 1 week, and finally she followed cardiology recommendation and came to ED. - in ED initial evaluation showing a narrow complex QRS tachycardia unable to differentiate, for which she did receive IV adenosine, then it was clear she is in A. fib with RVR, for which she has been started on Cardizem drip, her potassium level was 3.8, magnesium still pending, EKG showing prolonged QTC of 529, [facility] hospitalist consulted to admit.    Review of systems:    In addition to the HPI above, No Fever-chills, No Headache, No changes with Vision or hearing, No problems swallowing food or Liquids, No Chest pain, Cough ,she  does report shortness of breath at baseline, she does report palpitation. No Abdominal pain, No Nausea or Vommitting, Bowel movements are regular, No Blood in stool or Urine, No dysuria, No new skin rashes or bruises, No new joints pains-aches,  No new weakness, tingling, numbness in any extremity, No recent weight gain or loss, No polyuria, polydypsia or polyphagia, No significant Mental Stressors.  A full 10 point Review of Systems was done, except as stated above, all other Review of Systems were negative.   With Past History of the following :    Past Medical History:  Diagnosis Date  . Arthritis    pain and oa  right knee and pain in rt leg and foot--also pain left knee-but right is worse  . Atrial fibrillation ([place])   . Back pain    lumbar bulging disk  . Contact dermatitis    underside of left leg--always in the same spots--comes and goes  . Diverticulosis   . GERD (gastroesophageal reflux disease)  diet control  . Hemorrhoids   . Obesity   . Persistent atrial fibrillation [facility])       Past Surgical History:  Procedure Laterality Date  . ATRIAL FIBRILLATION ABLATION N/A 06/07/2019   Procedure: ATRIAL FIBRILLATION ABLATION;  Surgeon: Thompson Grayer, MD;  Location: [place] [facility];  Service: Cardiovascular;  Laterality: N/A;  . CARDIOVERSION N/A 05/09/2019   Procedure: CARDIOVERSION;  Surgeon: Buford Dresser, MD;  Location: [facility] ENDOSCOPY;  Service: Cardiovascular;  Laterality: N/A;  . CATARACT EXTRACTION W/PHACO Left 02/22/2015   Procedure: CATARACT EXTRACTION PHACO AND INTRAOCULAR LENS PLACEMENT LEFT EYE CDE=5.41;  Surgeon: Tonny Branch, MD;  Location: [facility];  Service: Ophthalmology;  Laterality: Left;  . CATARACT EXTRACTION W/PHACO Right 03/26/2015   Procedure: CATARACT EXTRACTION PHACO AND INTRAOCULAR LENS PLACEMENT (IOC);  Surgeon: Tonny Branch, MD;  Location: [facility] OR[facility];  Service: Ophthalmology;  Laterality: Right;  CDE:5.01  . CHOLECYSTECTOMY    . surgery for  broken right elbow Right   . TOTAL KNEE ARTHROPLASTY  11/03/2011   Procedure: TOTAL KNEE ARTHROPLASTY;  Surgeon: Gearlean Alf, MD;  Location: [facility] OR[facility];  Service: Orthopedics;  Laterality: Right;  . TUBAL LIGATION        Social History:     Social History   Tobacco Use  . Smoking status: Former Smoker    Packs/day: 1.00    Years: 38.00    Pack years: 38.00    Start date: 06/25/1957    Quit date: 04/28/1996    Years since quitting: 23.6  . Smokeless tobacco: Never Used  . Tobacco comment: quit smoking 1998  Substance Use Topics  . Alcohol use: Yes    Alcohol/week: 1.0 standard drink    Types: 1 Cans of beer per week       Family History :     Family History  Problem Relation Age of Onset  . Diabetes Mother   . Stroke Mother   . Stroke Father       Home Medications:   Prior to Admission medications   Medication Sig Start Date End Date Taking? Authorizing Provider  citalopram (CELEXA) 20 MG tablet Take 20 mg by mouth at bedtime.  10/28/12   [provider]  clonazePAM (KLONOPIN) 0.5 MG tablet Take 0.5 mg by mouth 2 (two) times daily as needed for anxiety.     [provider]  diltiazem (CARDIZEM CD) 240 MG 24 hr capsule Take 1 capsule (240 mg total) by mouth at bedtime. 09/29/19   Arnoldo Lenis, MD  diltiazem (CARDIZEM) 30 MG tablet TAKE 1 TABLET EVERY 8 HOURS AS NEEDED FOR PALPITATIONS Patient taking differently: Take 30 mg by mouth every 8 (eight) hours as needed (palpitations).  02/22/18   Arnoldo Lenis, MD  diphenhydrAMINE (BENADRYL) 25 MG tablet Take 25 mg by mouth daily as needed for allergies or sleep.     [provider]  diphenhydrAMINE-APAP, sleep, (EXCEDRIN PM) 38-500 MG TABS Take 1 tablet by mouth at bedtime as needed (sleep/pain).    [provider]  furosemide (LASIX) 20 MG tablet TAKE 1 TABLET BY MOUTH EVERY DAY Patient taking differently: Take 20 mg by mouth at bedtime.  04/19/19   Arnoldo Lenis, MD  Multiple  Vitamin (MULTIVITAMIN WITH MINERALS) TABS tablet Take 1 tablet by mouth once a week.    [provider]  pantoprazole (PROTONIX) 40 MG tablet TAKE 1 TABLET BY MOUTH EVERY DAY 07/15/19   Allred, Jeneen Rinks, MD  rivaroxaban (XARELTO) 20 MG TABS tablet  Take 20 mg by mouth daily with supper.    [provider]     Allergies:     Allergies  Allergen Reactions  . Codeine Shortness Of Breath and Itching  . Meloxicam Other (See Comments)    Stomach iritation      Physical Exam:   Vitals  Blood pressure 116/90, pulse (!) 158, temperature 98.1 F (36.7 C), temperature source Oral, resp. rate (!) 21, height 5\' 5"  (1.651 m), weight 115.7 kg, SpO2 94 %.    1. General well up female, laying in bed, in no apparent distress  2. Normal affect and insight, Not Suicidal or Homicidal, Awake Alert, Oriented X 3.  3. No F.N deficits, ALL C.Nerves Intact, Strength 5/5 all 4 extremities, Sensation intact all 4 extremities, Plantars down going.  4. Ears and Eyes appear Normal, Conjunctivae clear, PERRLA. Moist Oral Mucosa.  5. Supple Neck, No JVD, No cervical lymphadenopathy appriciated, No Carotid Bruits.  6. Symmetrical Chest wall movement, Good air movement bilaterally, CTAB.  7.  Irregular irregular, No Gallops, Rubs or Murmurs, No Parasternal Heave.  +1 edema  8. Positive Bowel Sounds, Abdomen Soft, No tenderness, No organomegaly appriciated,No rebound -guarding or rigidity.  9.  No Cyanosis, Normal Skin Turgor, No Skin Rash or Bruise.  10. Good muscle tone,  joints appear normal , no effusions, Normal ROM.  11. No Palpable Lymph Nodes in Neck or Axillae     Data Review:    CBC Recent Labs  Lab 12/16/19 1731  WBC 12.4*  HGB 13.5  HCT 43.2  PLT 433*  MCV 88.5  MCH 27.7  MCHC 31.3  RDW 15.4   ------------------------------------------------------------------------------------------------------------------  Chemistries  Recent Labs  Lab 12/16/19 1731  NA 137    K 3.8  CL 100  CO2 27  GLUCOSE 111*  BUN 11  CREATININE 0.85  CALCIUM 8.9   ------------------------------------------------------------------------------------------------------------------ estimated creatinine clearance is 73.8 mL/min (by C-G formula based on SCr of 0.85 mg/dL). ------------------------------------------------------------------------------------------------------------------ No results for input(s): TSH, T4TOTAL, T3FREE, THYROIDAB in the last 72 hours.  Invalid input(s): FREET3  Coagulation profile No results for input(s): INR, PROTIME in the last 168 hours. ------------------------------------------------------------------------------------------------------------------- No results for input(s): DDIMER in the last 72 hours. -------------------------------------------------------------------------------------------------------------------  Cardiac Enzymes No results for input(s): CKMB, TROPONINI, MYOGLOBIN in the last 168 hours.  Invalid input(s): CK ------------------------------------------------------------------------------------------------------------------    Component Value Date/Time   BNP 92.0 10/11/2017 1103     ---------------------------------------------------------------------------------------------------------------  Urinalysis    Component Value Date/Time   COLORURINE YELLOW 10/23/2011 1340   APPEARANCEUR CLEAR 10/23/2011 1340   LABSPEC 1.021 10/23/2011 1340   PHURINE 6.0 10/23/2011 1340   GLUCOSEU NEGATIVE 10/23/2011 1340   HGBUR NEGATIVE 10/23/2011 1340   BILIRUBINUR NEGATIVE 10/23/2011 1340   KETONESUR NEGATIVE 10/23/2011 1340   PROTEINUR NEGATIVE 10/23/2011 1340   UROBILINOGEN 0.2 10/23/2011 1340   NITRITE NEGATIVE 10/23/2011 1340   LEUKOCYTESUR NEGATIVE 10/23/2011 1340    ----------------------------------------------------------------------------------------------------------------   Imaging Results:    DG Chest Port 1  View  Result Date: 12/16/2019 CLINICAL DATA:  Atrial fibrillation with rapid ventricular response EXAM: PORTABLE CHEST 1 VIEW COMPARISON:  10/11/2017 FINDINGS: Single frontal view of the chest demonstrates an unremarkable cardiac silhouette. No airspace disease, effusion, or pneumothorax. No acute bony abnormalities. IMPRESSION: 1. No acute intrathoracic process. Electronically Signed   By: Randa Ngo M.D.   On: 12/16/2019 18:42    My personal review of EKG: Rhythm A fib with RVR, Rate  146 /min, QTc 529 ,  no Acute ST changes   Assessment & Plan:    Active Problems:   Chronic venous insufficiency   Atrial fibrillation (HCC)   Atrial fibrillation with RVR (HCC)   A. fib with RVR -Heart rate is significantly uncontrolled in the 160s, she did require adenosine in ED, she is in A. fib with RVR, she is currently on Cardizem drip, home medication include Cardizem CD 240 mg oral daily, will continue with Cardizem drip for now, admit to stepdown, will check TSH we will start metoprolol 25 mg every 6 hours, and will give one dose of digoxin hoping for better heart rate control even her soft blood pressure, will repeat in 3 hours if needed, and hopefully transition back to her home dose Cardizem, with consolidating the beta-blockers as well. - followed by Dr Rayann Heman, she is s/p ablation 05/2019  Hypertension  -Her blood pressure is on the lower side actually, this remains a persistent issue, then start her on digoxin for heart rate control.  Prolonged QTC -We will monitor on telemetry, will replete potassium for goal> 4, will check magnesium, goal is> 2.  CAD - nonobstructive disease by cath 2006 per cardiology notes -She denies any chest pain currently - 05/2019 CT cardiac prior to ablation, no significant CAD   Addendum:  -COVID-19 infection: Patient COVID-19 test came back positive, no evidence of pneumonia on chest x-ray, she has no hypoxia, she is vaccinated, no active treatment  indicated currently.  DVT Prophylaxis >>xarelto  AM Labs Ordered, also please review Full Orders  Family Communication: Admission, patients condition and plan of care including tests being ordered have been discussed with the patient who indicate understanding and agree with the plan and Code Status.  Code Status Full  Likely DC to Home  Condition GUARDED    Consults called: None  Admission status: Observation  Time spent in minutes : 60 minutes   Phillips Climes M.D on 12/16/2019 at 7:40 PM   [facility] - Office  [phone]

## 2019-12-17 DIAGNOSIS — U071 COVID-19: Secondary | ICD-10-CM

## 2019-12-17 DIAGNOSIS — I872 Venous insufficiency (chronic) (peripheral): Secondary | ICD-10-CM | POA: Diagnosis not present

## 2019-12-17 DIAGNOSIS — I4891 Unspecified atrial fibrillation: Secondary | ICD-10-CM | POA: Diagnosis not present

## 2019-12-17 LAB — CBC
HCT: 37.8 % (ref 36.0–46.0)
Hemoglobin: 11.6 g/dL — ABNORMAL LOW (ref 12.0–15.0)
MCH: 27.3 pg (ref 26.0–34.0)
MCHC: 30.7 g/dL (ref 30.0–36.0)
MCV: 88.9 fL (ref 80.0–100.0)
Platelets: 384 10*3/uL (ref 150–400)
RBC: 4.25 MIL/uL (ref 3.87–5.11)
RDW: 15.4 % (ref 11.5–15.5)
WBC: 10.4 10*3/uL (ref 4.0–10.5)
nRBC: 0 % (ref 0.0–0.2)

## 2019-12-17 LAB — BASIC METABOLIC PANEL
Anion gap: 8 (ref 5–15)
BUN: 10 mg/dL (ref 8–23)
CO2: 27 mmol/L (ref 22–32)
Calcium: 8.3 mg/dL — ABNORMAL LOW (ref 8.9–10.3)
Chloride: 105 mmol/L (ref 98–111)
Creatinine, Ser: 0.75 mg/dL (ref 0.44–1.00)
GFR calc Af Amer: >60 mL/min (ref >60)
GFR calc non Af Amer: >60 mL/min (ref >60)
Glucose, Bld: 106 mg/dL — ABNORMAL HIGH (ref 70–99)
Potassium: 4.3 mmol/L (ref 3.5–5.1)
Sodium: 140 mmol/L (ref 135–145)

## 2019-12-17 LAB — MRSA PCR SCREENING: MRSA by PCR: NEGATIVE

## 2019-12-17 MED ORDER — METOPROLOL TARTRATE 5 MG/5ML IV SOLN: 5.0000 mg | Freq: Four times a day (QID) | INTRAVENOUS | Status: DC | PRN

## 2019-12-17 MED ORDER — DILTIAZEM HCL 30 MG PO TABS
30.0000 mg | ORAL_TABLET | Freq: Three times a day (TID) | ORAL | Status: DC | PRN
Start: 2019-12-17 — End: 2021-04-26

## 2019-12-17 MED ORDER — RIVAROXABAN 20 MG PO TABS
20.0000 mg | ORAL_TABLET | Freq: Every day | ORAL | Status: DC
  Administered 2019-12-17: 20 mg via ORAL
  Filled 2019-12-17: qty 1

## 2019-12-17 MED ORDER — CHLORHEXIDINE GLUCONATE CLOTH 2 % EX PADS
6.0000 | MEDICATED_PAD | Freq: Every day | CUTANEOUS | Status: DC
  Administered 2019-12-17: 6 via TOPICAL

## 2019-12-17 MED ORDER — FUROSEMIDE 20 MG PO TABS: 20.0000 mg | ORAL_TABLET | Freq: Every day | ORAL | Status: DC

## 2019-12-17 MED ORDER — DILTIAZEM HCL ER COATED BEADS 240 MG PO CP24
240.0000 mg | ORAL_CAPSULE | Freq: Every day | ORAL | Status: DC
  Administered 2019-12-17: 240 mg via ORAL
  Filled 2019-12-17: qty 1

## 2019-12-17 NOTE — Progress Notes (Deleted)
Physician Discharge Summary  Brooke Luna XNA:[id number] DOB: 04-04-1946 DOA: 12/16/2019  PCP: Monico Blitz, MD Cardiology: Dr. Rayann Heman and [facility] Admit date: 12/16/2019 Discharge date: 12/17/2019  Admitted From:  Home  Disposition:  Home   Recommendations for Outpatient Follow-up:  1. Follow up with cardiology in 2 weeks  Discharge Condition: STABLE   CODE STATUS: FULL    Brief Hospitalization Summary: Please see all hospital notes, images, labs for full details of the hospitalization. ADMISSION HPI:  Brooke Luna  is a 74 y.o. female, history of paroxysmal A. fib, on Cardizem CD and Xarelto, status post ablation by Dr. Rayann Heman and February 2021, hypertension, CAD, patient presents to ED secondary to palpitation and accelerated heart rate, she was recommended by cardiology by phone to come to ED, patient reports she is compliant with her Cardizem CD, and has been taking as needed Cardizem as well, she has been feeling palpitation, reports averaging heart rate at home in the 160s, with blood pressure being in the 110's, she does report dyspnea at baseline, she denies any chest pain, lightheadedness or dizziness, but reports heart racing, and palpitation, she denies any caffeine use, patient has been hesitant to come given she is taking care of her sick husband at home, but symptoms has lasted for more than 1 week, and finally she followed cardiology recommendation and came to ED.  In ED initial evaluation showing a narrow complex QRS tachycardia unable to differentiate, for which she did receive IV adenosine, then it was clear she is in A. fib with RVR, for which she has been started on Cardizem drip, her potassium level was 3.8, magnesium still pending, EKG showing prolonged QTC of 529, [facility] hospitalist consulted to admit.  Pt was admitted for observation for atrial fibrillation with RVR. She was briefly on an IV cardizem infusion with good results and then started on her home oral  diltiazem 240 mg daily and she also takes 30 gm dilitazem prn for palpitations.  She was incidentally found to be Covid 19 positive. She has been asymptomatic.  She declined to take the monoclonal antibody treatments.  She feels better and she really wants to get home to her husband.  She will discharge home with follow up with her cardiologist and PCP recommended.  Pt advised to return to ER if symptoms come back or new problems develop.  She verbalized understanding.    Discharge Diagnoses:  Active Problems:   Chronic venous insufficiency   Atrial fibrillation [facility])   Atrial fibrillation with RVR [facility])   Discharge Instructions: Discharge Instructions    Ambulatory referral to Cardiology   Complete by: As directed      Allergies as of 12/17/2019      Reactions   Codeine Shortness Of Breath, Itching   Meloxicam Other (See Comments)   Stomach iritation      Medication List    STOP taking these medications   pantoprazole 40 MG tablet Commonly known as: PROTONIX     TAKE these medications   citalopram 20 MG tablet Commonly known as: CELEXA Take 20 mg by mouth at bedtime.   clonazePAM 0.5 MG tablet Commonly known as: KLONOPIN Take 0.5 mg by mouth 2 (two) times daily as needed for anxiety.   diltiazem 240 MG 24 hr capsule Commonly known as: CARDIZEM CD Take 1 capsule (240 mg total) by mouth at bedtime.   diltiazem 30 MG tablet Commonly known as: Cardizem Take 1 tablet (30 mg total) by mouth every 8 (eight) hours  as needed (palpitations).   diphenhydrAMINE 25 MG tablet Commonly known as: BENADRYL Take 25 mg by mouth daily as needed for allergies or sleep.   Excedrin PM 38-500 MG Tabs Generic drug: diphenhydrAMINE-APAP (sleep) Take 1 tablet by mouth at bedtime as needed (sleep/pain).   furosemide 20 MG tablet Commonly known as: LASIX Take 1 tablet (20 mg total) by mouth at bedtime.   multivitamin with minerals Tabs tablet Take 1 tablet by mouth once a week.    rivaroxaban 20 MG Tabs tablet Commonly known as: XARELTO Take 20 mg by mouth daily with supper.       Follow-up Information    Monico Blitz, MD. Schedule an appointment as soon as possible for a visit in 2 week(s).   Specialty: Internal Medicine Contact information: [address]  [place] [address] [phone]        Arnoldo Lenis, MD. Schedule an appointment as soon as possible for a visit in 2 week(s).   Specialty: Cardiology Contact information: [place] [address] [phone]              Allergies  Allergen Reactions  . Codeine Shortness Of Breath and Itching  . Meloxicam Other (See Comments)    Stomach iritation    Allergies as of 12/17/2019      Reactions   Codeine Shortness Of Breath, Itching   Meloxicam Other (See Comments)   Stomach iritation      Medication List    STOP taking these medications   pantoprazole 40 MG tablet Commonly known as: PROTONIX     TAKE these medications   citalopram 20 MG tablet Commonly known as: CELEXA Take 20 mg by mouth at bedtime.   clonazePAM 0.5 MG tablet Commonly known as: KLONOPIN Take 0.5 mg by mouth 2 (two) times daily as needed for anxiety.   diltiazem 240 MG 24 hr capsule Commonly known as: CARDIZEM CD Take 1 capsule (240 mg total) by mouth at bedtime.   diltiazem 30 MG tablet Commonly known as: Cardizem Take 1 tablet (30 mg total) by mouth every 8 (eight) hours as needed (palpitations).   diphenhydrAMINE 25 MG tablet Commonly known as: BENADRYL Take 25 mg by mouth daily as needed for allergies or sleep.   Excedrin PM 38-500 MG Tabs Generic drug: diphenhydrAMINE-APAP (sleep) Take 1 tablet by mouth at bedtime as needed (sleep/pain).   furosemide 20 MG tablet Commonly known as: LASIX Take 1 tablet (20 mg total) by mouth at bedtime.   multivitamin with minerals Tabs tablet Take 1 tablet by mouth once a week.   rivaroxaban 20 MG Tabs tablet Commonly known as:  XARELTO Take 20 mg by mouth daily with supper.      Procedures/Studies: DG Chest Port 1 View  Result Date: 12/16/2019 CLINICAL DATA:  Atrial fibrillation with rapid ventricular response EXAM: PORTABLE CHEST 1 VIEW COMPARISON:  10/11/2017 FINDINGS: Single frontal view of the chest demonstrates an unremarkable cardiac silhouette. No airspace disease, effusion, or pneumothorax. No acute bony abnormalities. IMPRESSION: 1. No acute intrathoracic process. Electronically Signed   By: Randa Ngo M.D.   On: 12/16/2019 18:42      Subjective: Pt says she feels a lot better today.  She is off the cardizem infusion.  She really wants to get home. She denies SOB, chest pain and palpitations.  She says she will follow up with her cardiologists.  She is followed by EP cardiology and Dr. Harl Bowie.   Discharge Exam: Vitals:  12/17/19 0900 12/17/19 1000  BP: 117/68 133/65  Pulse: (!) 101 99  Resp: (!) 22 20  Temp:    SpO2: 90% 90%   Vitals:   12/17/19 0800 12/17/19 0817 12/17/19 0900 12/17/19 1000  BP: 120/68 (!) 139/59 117/68 133/65  Pulse: 69  (!) 101 99  Resp: (!) 22  (!) 22 20  Temp:      TempSrc:      SpO2: 96%  90% 90%  Weight:      Height:       General: Pt is alert, awake, not in acute distress Cardiovascular: irregularly irregular S1/S2 +, no rubs, no gallops Respiratory: no increased work of breathing  Abdominal: Soft, NT, ND, bowel sounds + Extremities: no edema, no cyanosis   The results of significant diagnostics from this hospitalization (including imaging, microbiology, ancillary and laboratory) are listed below for reference.     Microbiology: Recent Results (from the past 240 hour(s))  SARS Coronavirus 2 by RT PCR (hospital order, performed in [facility] hospital lab) Nasopharyngeal Nasopharyngeal Swab     Status: Abnormal   Collection Time: 12/16/19  5:34 PM   Specimen: Nasopharyngeal Swab  Result Value Ref Range Status   SARS Coronavirus 2 POSITIVE (A) NEGATIVE  Final    Comment: RESULT CALLED TO, READ BACK BY AND VERIFIED WITH: OAKLEY,B ON 12/16/19 AT 2100 BY LOY,C (NOTE) SARS-CoV-2 target nucleic acids are DETECTED  SARS-CoV-2 RNA is generally detectable in upper respiratory specimens  during the acute phase of infection.  Positive results are indicative  of the presence of the identified virus, but do not rule out bacterial infection or co-infection with other pathogens not detected by the test.  Clinical correlation with patient history and  other diagnostic information is necessary to determine patient infection status.  The expected result is negative.  Fact Sheet for Patients:   StrictlyIdeas.no   Fact Sheet for Healthcare Providers:   BankingDealers.co.za    This test is not yet approved or cleared by the [place] FDA and  has been authorized for detection and/or diagnosis of SARS-CoV-2 by FDA under an Emergency Use Authorization (EUA).  This EUA will remain in effect (meaning this  test can be used) for the duration of  the COVID-19 declaration under Section 564(b)(1) of the Act, 21 U.S.C. section 360-bbb-3(b)(1), unless the authorization is terminated or revoked sooner.  Performed at [facility], [address] [place], [place] [address]   MRSA PCR Screening     Status: None   Collection Time: 12/16/19 11:38 PM   Specimen: Nasal Mucosa; Nasopharyngeal  Result Value Ref Range Status   MRSA by PCR NEGATIVE NEGATIVE Final    Comment:        The GeneXpert MRSA Assay (FDA approved for NASAL specimens only), is one component of a comprehensive MRSA colonization surveillance program. It is not intended to diagnose MRSA infection nor to guide or monitor treatment for MRSA infections. Performed at [facility], [address] [place], [place] [address]      Labs: BNP (last 3 results) No results for input(s): BNP in the last 8760 hours. Basic Metabolic Panel: Recent Labs   Lab 12/16/19 1731 12/16/19 1937 12/17/19 0423  NA 137  --  140  K 3.8  --  4.3  CL 100  --  105  CO2 27  --  27  GLUCOSE 111*  --  106*  BUN 11  --  10  CREATININE 0.85  --  0.75  CALCIUM 8.9  --  8.3*  MG  --  2.1  --    Liver Function Tests: No results for input(s): AST, ALT, ALKPHOS, BILITOT, PROT, ALBUMIN in the last 168 hours. No results for input(s): LIPASE, AMYLASE in the last 168 hours. No results for input(s): AMMONIA in the last 168 hours. CBC: Recent Labs  Lab 12/16/19 1731 12/17/19 0423  WBC 12.4* 10.4  HGB 13.5 11.6*  HCT 43.2 37.8  MCV 88.5 88.9  PLT 433* 384   Cardiac Enzymes: No results for input(s): CKTOTAL, CKMB, CKMBINDEX, TROPONINI in the last 168 hours. BNP: Invalid input(s): POCBNP CBG: No results for input(s): GLUCAP in the last 168 hours. D-Dimer No results for input(s): DDIMER in the last 72 hours. Hgb A1c No results for input(s): HGBA1C in the last 72 hours. Lipid Profile No results for input(s): CHOL, HDL, LDLCALC, TRIG, CHOLHDL, LDLDIRECT in the last 72 hours. Thyroid function studies Recent Labs    12/16/19 1731  TSH 4.207   Anemia work up No results for input(s): VITAMINB12, FOLATE, FERRITIN, TIBC, IRON, RETICCTPCT in the last 72 hours. Urinalysis    Component Value Date/Time   COLORURINE YELLOW 10/23/2011 [place] 10/23/2011 1340   LABSPEC 1.021 10/23/2011 1340   PHURINE 6.0 10/23/2011 1340   GLUCOSEU NEGATIVE 10/23/2011 1340   HGBUR NEGATIVE 10/23/2011 1340   BILIRUBINUR NEGATIVE 10/23/2011 1340   KETONESUR NEGATIVE 10/23/2011 1340   PROTEINUR NEGATIVE 10/23/2011 1340   UROBILINOGEN 0.2 10/23/2011 1340   NITRITE NEGATIVE 10/23/2011 1340   LEUKOCYTESUR NEGATIVE 10/23/2011 1340   Sepsis Labs Invalid input(s): PROCALCITONIN,  WBC,  LACTICIDVEN Microbiology Recent Results (from the past 240 hour(s))  SARS Coronavirus 2 by RT PCR (hospital order, performed in [facility] hospital lab) Nasopharyngeal  Nasopharyngeal Swab     Status: Abnormal   Collection Time: 12/16/19  5:34 PM   Specimen: Nasopharyngeal Swab  Result Value Ref Range Status   SARS Coronavirus 2 POSITIVE (A) NEGATIVE Final    Comment: RESULT CALLED TO, READ BACK BY AND VERIFIED WITH: OAKLEY,B ON 12/16/19 AT 2100 BY LOY,C (NOTE) SARS-CoV-2 target nucleic acids are DETECTED  SARS-CoV-2 RNA is generally detectable in upper respiratory specimens  during the acute phase of infection.  Positive results are indicative  of the presence of the identified virus, but do not rule out bacterial infection or co-infection with other pathogens not detected by the test.  Clinical correlation with patient history and  other diagnostic information is necessary to determine patient infection status.  The expected result is negative.  Fact Sheet for Patients:   StrictlyIdeas.no   Fact Sheet for Healthcare Providers:   BankingDealers.co.za    This test is not yet approved or cleared by the [place] FDA and  has been authorized for detection and/or diagnosis of SARS-CoV-2 by FDA under an Emergency Use Authorization (EUA).  This EUA will remain in effect (meaning this  test can be used) for the duration of  the COVID-19 declaration under Section 564(b)(1) of the Act, 21 U.S.C. section 360-bbb-3(b)(1), unless the authorization is terminated or revoked sooner.  Performed at [facility], [address], [place], [place] [address]   MRSA PCR Screening     Status: None   Collection Time: 12/16/19 11:38 PM   Specimen: Nasal Mucosa; Nasopharyngeal  Result Value Ref Range Status   MRSA by PCR NEGATIVE NEGATIVE Final    Comment:        The GeneXpert MRSA Assay (FDA approved for  NASAL specimens only), is one component of a comprehensive MRSA colonization surveillance program. It is not intended to diagnose MRSA infection nor to guide or monitor treatment for MRSA infections. Performed  at [facility], [address], [place], [place] [address]    Time coordinating discharge:   SIGNED:  Irwin Brakeman, MD  [facility] 12/17/2019, 1:13 PM How to contact the [facility] Attending or Consulting provider [place] or covering provider during after hours [place], for this patient?  1. Check the care team in [facility] and look for a) attending/consulting [facility] provider listed and b) the [facility] team listed 2. Log into [email or web address] and use [place]'s universal password to access. If you do not have the password, please contact the hospital operator. 3. Locate the [facility] provider you are looking for under [facility] and page to a number that you can be directly reached. 4. If you still have difficulty reaching the provider, please page the [facility] (Director on Call) for the Hospitalists listed on amion for assistance.

## 2019-12-17 NOTE — Care Management (Signed)
12/17/2019 1:03 PM   I counseled with patient about receiving monoclonal antibody infusion for asymptomatic Covid infection and discussed benefits and possible risks  However patient has decided against taking it at this time.    Murvin Natal MD  How to contact the [facility] Attending or Consulting provider [place] or covering provider during after hours [place], for this patient?  1. Check the care team in [facility] and look for a) attending/consulting [facility] provider listed and b) the [facility] team listed 2. Log into [email or web address] and use [place]'s universal password to access. If you do not have the password, please contact the hospital operator. 3. Locate the [facility] provider you are looking for under [facility] and page to a number that you can be directly reached. 4. If you still have difficulty reaching the provider, please page the [facility] (Director on Call) for the Hospitalists listed on amion for assistance.

## 2019-12-17 NOTE — Discharge Summary (Signed)
Physician Discharge Summary  Brooke Luna WEX:[id number] DOB: 1946-03-11 DOA: 12/16/2019  PCP: Monico Blitz, MD Cardiology: Dr. Rayann Heman and Branch Admit date: 12/16/2019 Discharge date: 12/17/2019  Admitted From:  Home  Disposition:  Home   Recommendations for Outpatient Follow-up:  1. Follow up with cardiology in 2 weeks  Discharge Condition: STABLE   CODE STATUS: FULL    Brief Hospitalization Summary: Please see all hospital notes, images, labs for full details of the hospitalization. ADMISSION HPI:  Brooke Luna  is a 74 y.o. female, history of paroxysmal A. fib, on Cardizem CD and Xarelto, status post ablation by Dr. Rayann Heman and February 2021, hypertension, CAD, patient presents to ED secondary to palpitation and accelerated heart rate, she was recommended by cardiology by phone to come to ED, patient reports she is compliant with her Cardizem CD, and has been taking as needed Cardizem as well, she has been feeling palpitation, reports averaging heart rate at home in the 160s, with blood pressure being in the 110's, she does report dyspnea at baseline, she denies any chest pain, lightheadedness or dizziness, but reports heart racing, and palpitation, she denies any caffeine use, patient has been hesitant to come given she is taking care of her sick husband at home, but symptoms has lasted for more than 1 week, and finally she followed cardiology recommendation and came to ED.  In ED initial evaluation showing a narrow complex QRS tachycardia unable to differentiate, for which she did receive IV adenosine, then it was clear she is in A. fib with RVR, for which she has been started on Cardizem drip, her potassium level was 3.8, magnesium still pending, EKG showing prolonged QTC of 529, [facility] hospitalist consulted to admit.  Pt was admitted for observation for atrial fibrillation with RVR. She was briefly on an IV cardizem infusion with good results and then started on her home oral  diltiazem 240 mg daily and she also takes 30 gm dilitazem prn for palpitations.  She was incidentally found to be Covid 19 positive. She has been asymptomatic.  She declined to take the monoclonal antibody treatments.  She feels better and she really wants to get home to her husband.  She will discharge home with follow up with her cardiologist and PCP recommended.  Pt advised to return to ER if symptoms come back or new problems develop.  She verbalized understanding.    Discharge Diagnoses:  Active Problems:   Chronic venous insufficiency   Atrial fibrillation [facility])   Atrial fibrillation with RVR [facility])   Discharge Instructions: Discharge Instructions    Ambulatory referral to Cardiology   Complete by: As directed      Allergies as of 12/17/2019      Reactions   Codeine Shortness Of Breath, Itching   Meloxicam Other (See Comments)   Stomach iritation      Medication List    STOP taking these medications   pantoprazole 40 MG tablet Commonly known as: PROTONIX     TAKE these medications   citalopram 20 MG tablet Commonly known as: CELEXA Take 20 mg by mouth at bedtime.   clonazePAM 0.5 MG tablet Commonly known as: KLONOPIN Take 0.5 mg by mouth 2 (two) times daily as needed for anxiety.   diltiazem 240 MG 24 hr capsule Commonly known as: CARDIZEM CD Take 1 capsule (240 mg total) by mouth at bedtime.   diltiazem 30 MG tablet Commonly known as: Cardizem Take 1 tablet (30 mg total) by mouth every 8 (eight) hours  as needed (palpitations).   diphenhydrAMINE 25 MG tablet Commonly known as: BENADRYL Take 25 mg by mouth daily as needed for allergies or sleep.   Excedrin PM 38-500 MG Tabs Generic drug: diphenhydrAMINE-APAP (sleep) Take 1 tablet by mouth at bedtime as needed (sleep/pain).   furosemide 20 MG tablet Commonly known as: LASIX Take 1 tablet (20 mg total) by mouth at bedtime.   multivitamin with minerals Tabs tablet Take 1 tablet by mouth once a week.    rivaroxaban 20 MG Tabs tablet Commonly known as: XARELTO Take 20 mg by mouth daily with supper.       Follow-up Information    Monico Blitz, MD. Schedule an appointment as soon as possible for a visit in 2 week(s).   Specialty: Internal Medicine Contact information: [address]  [place] [address] [phone]        Arnoldo Lenis, MD. Schedule an appointment as soon as possible for a visit in 2 week(s).   Specialty: Cardiology Contact information: [place] [address] [phone]              Allergies  Allergen Reactions  . Codeine Shortness Of Breath and Itching  . Meloxicam Other (See Comments)    Stomach iritation    Allergies as of 12/17/2019      Reactions   Codeine Shortness Of Breath, Itching   Meloxicam Other (See Comments)   Stomach iritation      Medication List    STOP taking these medications   pantoprazole 40 MG tablet Commonly known as: PROTONIX     TAKE these medications   citalopram 20 MG tablet Commonly known as: CELEXA Take 20 mg by mouth at bedtime.   clonazePAM 0.5 MG tablet Commonly known as: KLONOPIN Take 0.5 mg by mouth 2 (two) times daily as needed for anxiety.   diltiazem 240 MG 24 hr capsule Commonly known as: CARDIZEM CD Take 1 capsule (240 mg total) by mouth at bedtime.   diltiazem 30 MG tablet Commonly known as: Cardizem Take 1 tablet (30 mg total) by mouth every 8 (eight) hours as needed (palpitations).   diphenhydrAMINE 25 MG tablet Commonly known as: BENADRYL Take 25 mg by mouth daily as needed for allergies or sleep.   Excedrin PM 38-500 MG Tabs Generic drug: diphenhydrAMINE-APAP (sleep) Take 1 tablet by mouth at bedtime as needed (sleep/pain).   furosemide 20 MG tablet Commonly known as: LASIX Take 1 tablet (20 mg total) by mouth at bedtime.   multivitamin with minerals Tabs tablet Take 1 tablet by mouth once a week.   rivaroxaban 20 MG Tabs tablet Commonly known as:  XARELTO Take 20 mg by mouth daily with supper.      Procedures/Studies: DG Chest Port 1 View  Result Date: 12/16/2019 CLINICAL DATA:  Atrial fibrillation with rapid ventricular response EXAM: PORTABLE CHEST 1 VIEW COMPARISON:  10/11/2017 FINDINGS: Single frontal view of the chest demonstrates an unremarkable cardiac silhouette. No airspace disease, effusion, or pneumothorax. No acute bony abnormalities. IMPRESSION: 1. No acute intrathoracic process. Electronically Signed   By: Randa Ngo M.D.   On: 12/16/2019 18:42      Subjective: Pt says she feels a lot better today.  She is off the cardizem infusion.  She really wants to get home. She denies SOB, chest pain and palpitations.  She says she will follow up with her cardiologists.  She is followed by EP cardiology and Dr. Harl Bowie.   Discharge Exam: Vitals:  12/17/19 0900 12/17/19 1000  BP: 117/68 133/65  Pulse: (!) 101 99  Resp: (!) 22 20  Temp:    SpO2: 90% 90%   Vitals:   12/17/19 0800 12/17/19 0817 12/17/19 0900 12/17/19 1000  BP: 120/68 (!) 139/59 117/68 133/65  Pulse: 69  (!) 101 99  Resp: (!) 22  (!) 22 20  Temp:      TempSrc:      SpO2: 96%  90% 90%  Weight:      Height:       General: Pt is alert, awake, not in acute distress Cardiovascular: irregularly irregular S1/S2 +, no rubs, no gallops Respiratory: no increased work of breathing  Abdominal: Soft, NT, ND, bowel sounds + Extremities: no edema, no cyanosis   The results of significant diagnostics from this hospitalization (including imaging, microbiology, ancillary and laboratory) are listed below for reference.     Microbiology: Recent Results (from the past 240 hour(s))  SARS Coronavirus 2 by RT PCR (hospital order, performed in [facility] hospital lab) Nasopharyngeal Nasopharyngeal Swab     Status: Abnormal   Collection Time: 12/16/19  5:34 PM   Specimen: Nasopharyngeal Swab  Result Value Ref Range Status   SARS Coronavirus 2 POSITIVE (A) NEGATIVE  Final    Comment: RESULT CALLED TO, READ BACK BY AND VERIFIED WITH: OAKLEY,B ON 12/16/19 AT 2100 BY LOY,C (NOTE) SARS-CoV-2 target nucleic acids are DETECTED  SARS-CoV-2 RNA is generally detectable in upper respiratory specimens  during the acute phase of infection.  Positive results are indicative  of the presence of the identified virus, but do not rule out bacterial infection or co-infection with other pathogens not detected by the test.  Clinical correlation with patient history and  other diagnostic information is necessary to determine patient infection status.  The expected result is negative.  Fact Sheet for Patients:   StrictlyIdeas.no   Fact Sheet for Healthcare Providers:   BankingDealers.co.za    This test is not yet approved or cleared by the [place] FDA and  has been authorized for detection and/or diagnosis of SARS-CoV-2 by FDA under an Emergency Use Authorization (EUA).  This EUA will remain in effect (meaning this  test can be used) for the duration of  the COVID-19 declaration under Section 564(b)(1) of the Act, 21 U.S.C. section 360-bbb-3(b)(1), unless the authorization is terminated or revoked sooner.  Performed at [facility], [address] [place], [place] [address]   MRSA PCR Screening     Status: None   Collection Time: 12/16/19 11:38 PM   Specimen: Nasal Mucosa; Nasopharyngeal  Result Value Ref Range Status   MRSA by PCR NEGATIVE NEGATIVE Final    Comment:        The GeneXpert MRSA Assay (FDA approved for NASAL specimens only), is one component of a comprehensive MRSA colonization surveillance program. It is not intended to diagnose MRSA infection nor to guide or monitor treatment for MRSA infections. Performed at [facility], [address] [place], [place] [address]      Labs: BNP (last 3 results) No results for input(s): BNP in the last 8760 hours. Basic Metabolic Panel: Recent Labs   Lab 12/16/19 1731 12/16/19 1937 12/17/19 0423  NA 137  --  140  K 3.8  --  4.3  CL 100  --  105  CO2 27  --  27  GLUCOSE 111*  --  106*  BUN 11  --  10  CREATININE 0.85  --  0.75  CALCIUM 8.9  --  8.3*  MG  --  2.1  --    Liver Function Tests: No results for input(s): AST, ALT, ALKPHOS, BILITOT, PROT, ALBUMIN in the last 168 hours. No results for input(s): LIPASE, AMYLASE in the last 168 hours. No results for input(s): AMMONIA in the last 168 hours. CBC: Recent Labs  Lab 12/16/19 1731 12/17/19 0423  WBC 12.4* 10.4  HGB 13.5 11.6*  HCT 43.2 37.8  MCV 88.5 88.9  PLT 433* 384   Cardiac Enzymes: No results for input(s): CKTOTAL, CKMB, CKMBINDEX, TROPONINI in the last 168 hours. BNP: Invalid input(s): POCBNP CBG: No results for input(s): GLUCAP in the last 168 hours. D-Dimer No results for input(s): DDIMER in the last 72 hours. Hgb A1c No results for input(s): HGBA1C in the last 72 hours. Lipid Profile No results for input(s): CHOL, HDL, LDLCALC, TRIG, CHOLHDL, LDLDIRECT in the last 72 hours. Thyroid function studies Recent Labs    12/16/19 1731  TSH 4.207   Anemia work up No results for input(s): VITAMINB12, FOLATE, FERRITIN, TIBC, IRON, RETICCTPCT in the last 72 hours. Urinalysis    Component Value Date/Time   COLORURINE YELLOW 10/23/2011 [place] 10/23/2011 1340   LABSPEC 1.021 10/23/2011 1340   PHURINE 6.0 10/23/2011 1340   GLUCOSEU NEGATIVE 10/23/2011 1340   HGBUR NEGATIVE 10/23/2011 1340   BILIRUBINUR NEGATIVE 10/23/2011 1340   KETONESUR NEGATIVE 10/23/2011 1340   PROTEINUR NEGATIVE 10/23/2011 1340   UROBILINOGEN 0.2 10/23/2011 1340   NITRITE NEGATIVE 10/23/2011 1340   LEUKOCYTESUR NEGATIVE 10/23/2011 1340   Sepsis Labs Invalid input(s): PROCALCITONIN,  WBC,  LACTICIDVEN Microbiology Recent Results (from the past 240 hour(s))  SARS Coronavirus 2 by RT PCR (hospital order, performed in [place] hospital lab) Nasopharyngeal  Nasopharyngeal Swab     Status: Abnormal   Collection Time: 12/16/19  5:34 PM   Specimen: Nasopharyngeal Swab  Result Value Ref Range Status   SARS Coronavirus 2 POSITIVE (A) NEGATIVE Final    Comment: RESULT CALLED TO, READ BACK BY AND VERIFIED WITH: OAKLEY,B ON 12/16/19 AT 2100 BY LOY,C (NOTE) SARS-CoV-2 target nucleic acids are DETECTED  SARS-CoV-2 RNA is generally detectable in upper respiratory specimens  during the acute phase of infection.  Positive results are indicative  of the presence of the identified virus, but do not rule out bacterial infection or co-infection with other pathogens not detected by the test.  Clinical correlation with patient history and  other diagnostic information is necessary to determine patient infection status.  The expected result is negative.  Fact Sheet for Patients:   StrictlyIdeas.no   Fact Sheet for Healthcare Providers:   BankingDealers.co.za    This test is not yet approved or cleared by the [place] FDA and  has been authorized for detection and/or diagnosis of SARS-CoV-2 by FDA under an Emergency Use Authorization (EUA).  This EUA will remain in effect (meaning this  test can be used) for the duration of  the COVID-19 declaration under Section 564(b)(1) of the Act, 21 U.S.C. section 360-bbb-3(b)(1), unless the authorization is terminated or revoked sooner.  Performed at [facility], [address] [place],  [address]   MRSA PCR Screening     Status: None   Collection Time: 12/16/19 11:38 PM   Specimen: Nasal Mucosa; Nasopharyngeal  Result Value Ref Range Status   MRSA by PCR NEGATIVE NEGATIVE Final    Comment:        The GeneXpert MRSA Assay (FDA approved for  NASAL specimens only), is one component of a comprehensive MRSA colonization surveillance program. It is not intended to diagnose MRSA infection nor to guide or monitor treatment for MRSA infections. Performed  at [facility], [address] [place], [place] [address]    Time coordinating discharge:   SIGNED:  Irwin Brakeman, MD  [facility] 12/17/2019, 1:13 PM How to contact the [facility] Attending or Consulting provider [place] or covering provider during after hours [place], for this patient?  1. Check the care team in [facility] and look for a) attending/consulting [facility] provider listed and b) the [facility] team listed 2. Log into [email or web address] and use [place]'s universal password to access. If you do not have the password, please contact the hospital operator. 3. Locate the [facility] provider you are looking for under [facility] and page to a number that you can be directly reached. 4. If you still have difficulty reaching the provider, please page the [facility] (Director on Call) for the Hospitalists listed on amion for assistance.

## 2019-12-19 ENCOUNTER — Ambulatory Visit (HOSPITAL_COMMUNITY): Payer: Medicare Other | Admitting: Nurse Practitioner

## 2019-12-28 NOTE — Progress Notes (Signed)
Called patient, asked her if she was having any symptoms, "no she never had any symptoms. Patient was diagnosed with Covid 12 days ago. Confirmed her covid test for Friday at 8:45. Nothing further needed.

## 2019-12-30 ENCOUNTER — Encounter: Payer: Self-pay | Admitting: Internal Medicine

## 2019-12-30 ENCOUNTER — Ambulatory Visit (INDEPENDENT_AMBULATORY_CARE_PROVIDER_SITE_OTHER): Payer: Medicare Other | Admitting: Internal Medicine

## 2019-12-30 ENCOUNTER — Other Ambulatory Visit (HOSPITAL_COMMUNITY)
Admission: RE | Admit: 2019-12-30 | Discharge: 2019-12-30 | Disposition: A | Discharge status: Home / Self Care | Payer: Medicare Other | Source: Ambulatory Visit | Attending: Cardiology | Admitting: Cardiology

## 2019-12-30 ENCOUNTER — Other Ambulatory Visit: Payer: Self-pay

## 2019-12-30 VITALS — BP 114/62 | HR 60 | Ht 65.0 in | Wt 258.6 lb

## 2019-12-30 DIAGNOSIS — D6869 Other thrombophilia: Secondary | ICD-10-CM | POA: Diagnosis not present

## 2019-12-30 DIAGNOSIS — Z01812 Encounter for preprocedural laboratory examination: Secondary | ICD-10-CM | POA: Diagnosis not present

## 2019-12-30 DIAGNOSIS — Z20822 Contact with and (suspected) exposure to covid-19: Secondary | ICD-10-CM | POA: Diagnosis not present

## 2019-12-30 DIAGNOSIS — I10 Essential (primary) hypertension: Secondary | ICD-10-CM

## 2019-12-30 DIAGNOSIS — I480 Paroxysmal atrial fibrillation: Secondary | ICD-10-CM

## 2019-12-30 LAB — SARS CORONAVIRUS 2 (TAT 6-24 HRS): SARS Coronavirus 2: NEGATIVE

## 2019-12-30 NOTE — Patient Instructions (Signed)
Medication Instructions:  Continue all current medications.  Labwork: none  Testing/Procedures: none  Follow-Up:  3 months - AFib clinic  6 months - [place]   Any Other Special Instructions Will Be Listed Below (If Applicable).  If you need a refill on your cardiac medications before your next appointment, please call your pharmacy.

## 2019-12-30 NOTE — Progress Notes (Signed)
PCP: Monico Blitz, MD Primary Cardiologist: Dr Harl Bowie  Primary EP: Dr Rayann Heman  Brooke Luna is a 74 y.o. female who presents today for routine electrophysiology followup.  Since last being seen in our clinic, the patient reports doing very well. She did have an episode of afib/ atrial flutter 12/16/19 (Ekgs reviewed) with an ED visit at that time. No further events.  Today, she denies symptoms of palpitations, chest pain, shortness of breath,  lower extremity edema, dizziness, presyncope, or syncope.  The patient is otherwise without complaint today.   Past Medical History:  Diagnosis Date  . Arthritis    pain and oa  right knee and pain in rt leg and foot--also pain left knee-but right is worse  . Atrial fibrillation ([place])   . Back pain    lumbar bulging disk  . Contact dermatitis    underside of left leg--always in the same spots--comes and goes  . Diverticulosis   . GERD (gastroesophageal reflux disease)    diet control  . Hemorrhoids   . Obesity   . Persistent atrial fibrillation [facility])    Past Surgical History:  Procedure Laterality Date  . ATRIAL FIBRILLATION ABLATION N/A 06/07/2019   Procedure: ATRIAL FIBRILLATION ABLATION;  Surgeon: Thompson Grayer, MD;  Location: [place] CV LAB;  Service: Cardiovascular;  Laterality: N/A;  . CARDIOVERSION N/A 05/09/2019   Procedure: CARDIOVERSION;  Surgeon: Buford Dresser, MD;  Location: [facility] ENDOSCOPY;  Service: Cardiovascular;  Laterality: N/A;  . CATARACT EXTRACTION W/PHACO Left 02/22/2015   Procedure: CATARACT EXTRACTION PHACO AND INTRAOCULAR LENS PLACEMENT LEFT EYE CDE=5.41;  Surgeon: Tonny Branch, MD;  Location: [facility] ORS;  Service: Ophthalmology;  Laterality: Left;  . CATARACT EXTRACTION W/PHACO Right 03/26/2015   Procedure: CATARACT EXTRACTION PHACO AND INTRAOCULAR LENS PLACEMENT (IOC);  Surgeon: Tonny Branch, MD;  Location: [facility] ORS;  Service: Ophthalmology;  Laterality: Right;  CDE:5.01  . CHOLECYSTECTOMY    . surgery for  broken right elbow Right   . TOTAL KNEE ARTHROPLASTY  11/03/2011   Procedure: TOTAL KNEE ARTHROPLASTY;  Surgeon: Gearlean Alf, MD;  Location: [facility] ORS;  Service: Orthopedics;  Laterality: Right;  . TUBAL LIGATION      ROS- all systems are reviewed and negatives except as per HPI above  Current Outpatient Medications  Medication Sig Dispense Refill  . citalopram (CELEXA) 20 MG tablet Take 20 mg by mouth at bedtime.     . clonazePAM (KLONOPIN) 0.5 MG tablet Take 0.5 mg by mouth 2 (two) times daily as needed for anxiety.     Marland Kitchen diltiazem (CARDIZEM CD) 240 MG 24 hr capsule Take 1 capsule (240 mg total) by mouth at bedtime. 90 capsule 1  . diltiazem (CARDIZEM) 30 MG tablet Take 1 tablet (30 mg total) by mouth every 8 (eight) hours as needed (palpitations).    . diphenhydrAMINE (BENADRYL) 25 MG tablet Take 25 mg by mouth daily as needed for allergies or sleep.     . diphenhydrAMINE-APAP, sleep, (EXCEDRIN PM) 38-500 MG TABS Take 1 tablet by mouth at bedtime as needed (sleep/pain).    . furosemide (LASIX) 20 MG tablet Take 1 tablet (20 mg total) by mouth at bedtime.    . Multiple Vitamin (MULTIVITAMIN WITH MINERALS) TABS tablet Take 1 tablet by mouth once a week.    . rivaroxaban (XARELTO) 20 MG TABS tablet Take 20 mg by mouth daily with supper.     No current facility-administered medications for this visit.    Physical Exam: Vitals:  12/30/19 1149  BP: 114/62  Pulse: 60  SpO2: 93%  Weight: 258 lb 9.6 oz (117.3 kg)  Height: 5\' 5"  (1.651 m)    GEN- The patient is well appearing, alert and oriented x 3 today.   Head- normocephalic, atraumatic Eyes-  Sclera clear, conjunctiva pink Ears- hearing intact Oropharynx- clear Lungs- Clear to ausculation bilaterally, normal work of breathing Heart- Regular rate and rhythm, no murmurs, rubs or gallops, PMI not laterally displaced GI- soft, NT, ND, + BS Extremities- no clubbing, cyanosis, or edema  Wt Readings from Last 3 Encounters:  12/30/19  258 lb 9.6 oz (117.3 kg)  12/16/19 262 lb 2 oz (118.9 kg)  12/16/19 261 lb (118.4 kg)    EKG tracing ordered today is personally reviewed and shows sinus  Assessment and Plan:  1. Persistent atrial fibrillation Doing very well post ablation off AAD therapy She did have an episode of afib/ atrial flutter 12/16/19 (Ekgs reviewed) with an ED visit at that time. chads2vasc score is 2.  She is on xarelto She has not been diligent with lifestyle modification.  We discussed at length today.  She has done suprisingly well post ablation given obesity  2. Obesity lifestyle modification is advised Body mass index is 43.03 kg/m.   3. HTN Stable No change required today   Risks, benefits and potential toxicities for medications prescribed and/or refilled reviewed with patient today.   AF clinic in 3 months Return to see me in 6 months  Thompson Grayer MD, [facility] 12/30/2019 12:20 PM

## 2020-01-03 ENCOUNTER — Ambulatory Visit (HOSPITAL_COMMUNITY)
Admission: RE | Admit: 2020-01-03 | Discharge: 2020-01-03 | Disposition: A | Discharge status: Home / Self Care | Payer: Medicare Other | Source: Ambulatory Visit | Attending: Cardiology | Admitting: Cardiology

## 2020-01-03 ENCOUNTER — Other Ambulatory Visit: Payer: Self-pay

## 2020-01-03 DIAGNOSIS — R0602 Shortness of breath: Secondary | ICD-10-CM | POA: Insufficient documentation

## 2020-01-03 LAB — PULMONARY FUNCTION TEST
DL/VA % pred: 122 %
DL/VA: 5.01 ml/min/mmHg/L
DLCO cor % pred: 76 %
DLCO cor: 15.08 ml/min/mmHg
DLCO unc % pred: 71 %
DLCO unc: 14.17 ml/min/mmHg
FEF 25-75 Post: 1.57 L/sec
FEF 25-75 Pre: 1.44 L/sec
FEF2575-%Change-Post: 9 %
FEF2575-%Pred-Post: 89 %
FEF2575-%Pred-Pre: 81 %
FEV1-%Change-Post: 9 %
FEV1-%Pred-Post: 67 %
FEV1-%Pred-Pre: 61 %
FEV1-Post: 1.51 L
FEV1-Pre: 1.38 L
FEV1FVC-%Change-Post: 0 %
FEV1FVC-%Pred-Pre: 107 %
FEV6-%Change-Post: 10 %
FEV6-%Pred-Post: 65 %
FEV6-%Pred-Pre: 59 %
FEV6-Post: 1.87 L
FEV6-Pre: 1.7 L
FEV6FVC-%Change-Post: 0 %
FEV6FVC-%Pred-Post: 104 %
FEV6FVC-%Pred-Pre: 104 %
FVC-%Change-Post: 8 %
FVC-%Pred-Post: 62 %
FVC-%Pred-Pre: 57 %
FVC-Post: 1.87 L
FVC-Pre: 1.72 L
Post FEV1/FVC ratio: 81 %
Post FEV6/FVC ratio: 100 %
Pre FEV1/FVC ratio: 81 %
Pre FEV6/FVC Ratio: 100 %
RV % pred: 111 %
RV: 2.58 L
TLC % pred: 85 %
TLC: 4.47 L

## 2020-01-03 MED ORDER — ALBUTEROL SULFATE (2.5 MG/3ML) 0.083% IN NEBU
2.5000 mg | INHALATION_SOLUTION | Freq: Once | RESPIRATORY_TRACT | Status: AC
  Administered 2020-01-03: 2.5 mg via RESPIRATORY_TRACT

## 2020-01-03 NOTE — Addendum Note (Signed)
Addended by: Merlene Laughter on: 01/03/2020 03:09 PM   Modules accepted: Orders

## 2020-01-06 DIAGNOSIS — Z1331 Encounter for screening for depression: Secondary | ICD-10-CM | POA: Diagnosis not present

## 2020-01-06 DIAGNOSIS — Z79899 Other long term (current) drug therapy: Secondary | ICD-10-CM | POA: Diagnosis not present

## 2020-01-06 DIAGNOSIS — Z7189 Other specified counseling: Secondary | ICD-10-CM | POA: Diagnosis not present

## 2020-01-06 DIAGNOSIS — Z1339 Encounter for screening examination for other mental health and behavioral disorders: Secondary | ICD-10-CM | POA: Diagnosis not present

## 2020-01-06 DIAGNOSIS — Z299 Encounter for prophylactic measures, unspecified: Secondary | ICD-10-CM | POA: Diagnosis not present

## 2020-01-06 DIAGNOSIS — Z6841 Body Mass Index (BMI) 40.0 and over, adult: Secondary | ICD-10-CM | POA: Diagnosis not present

## 2020-01-06 DIAGNOSIS — I10 Essential (primary) hypertension: Secondary | ICD-10-CM | POA: Diagnosis not present

## 2020-01-06 DIAGNOSIS — E559 Vitamin D deficiency, unspecified: Secondary | ICD-10-CM | POA: Diagnosis not present

## 2020-01-06 DIAGNOSIS — E7800 Pure hypercholesterolemia, unspecified: Secondary | ICD-10-CM | POA: Diagnosis not present

## 2020-01-06 DIAGNOSIS — Z0000 Encounter for general adult medical examination without abnormal findings: Secondary | ICD-10-CM | POA: Diagnosis not present

## 2020-01-06 DIAGNOSIS — R5383 Other fatigue: Secondary | ICD-10-CM | POA: Diagnosis not present

## 2020-01-31 ENCOUNTER — Telehealth: Payer: Self-pay | Admitting: *Deleted

## 2020-01-31 ENCOUNTER — Telehealth: Payer: Self-pay | Admitting: Cardiology

## 2020-01-31 NOTE — Telephone Encounter (Signed)
Pt voiced understanding - routed to pcp  

## 2020-01-31 NOTE — Telephone Encounter (Signed)
Difficulty with PFT report, discussed with Dr Melvyn Novas, findings as reported below.    Dx is moderate restriction with very low ERV c/w obesity/ minimal diffusion reduction and really very minimal obstruction

## 2020-01-31 NOTE — Telephone Encounter (Signed)
-----   Message from Arnoldo Lenis, MD sent at 01/31/2020 11:50 AM EDT ----- Breathing tests show some changes associated with being overight, something call restrictive lung disease. There is no significant evidence of COPD   Zandra Abts MD

## 2020-02-07 DIAGNOSIS — I781 Nevus, non-neoplastic: Secondary | ICD-10-CM | POA: Diagnosis not present

## 2020-02-07 DIAGNOSIS — L304 Erythema intertrigo: Secondary | ICD-10-CM | POA: Diagnosis not present

## 2020-02-07 DIAGNOSIS — L570 Actinic keratosis: Secondary | ICD-10-CM | POA: Diagnosis not present

## 2020-02-07 DIAGNOSIS — L821 Other seborrheic keratosis: Secondary | ICD-10-CM | POA: Diagnosis not present

## 2020-02-09 DIAGNOSIS — I10 Essential (primary) hypertension: Secondary | ICD-10-CM | POA: Diagnosis not present

## 2020-02-09 DIAGNOSIS — R0602 Shortness of breath: Secondary | ICD-10-CM | POA: Diagnosis not present

## 2020-02-09 DIAGNOSIS — Z6841 Body Mass Index (BMI) 40.0 and over, adult: Secondary | ICD-10-CM | POA: Diagnosis not present

## 2020-02-09 DIAGNOSIS — Z299 Encounter for prophylactic measures, unspecified: Secondary | ICD-10-CM | POA: Diagnosis not present

## 2020-02-09 DIAGNOSIS — I480 Paroxysmal atrial fibrillation: Secondary | ICD-10-CM | POA: Diagnosis not present

## 2020-02-10 DIAGNOSIS — K760 Fatty (change of) liver, not elsewhere classified: Secondary | ICD-10-CM | POA: Diagnosis not present

## 2020-02-10 DIAGNOSIS — I517 Cardiomegaly: Secondary | ICD-10-CM | POA: Diagnosis not present

## 2020-02-10 DIAGNOSIS — R590 Localized enlarged lymph nodes: Secondary | ICD-10-CM | POA: Diagnosis not present

## 2020-02-10 DIAGNOSIS — R0602 Shortness of breath: Secondary | ICD-10-CM | POA: Diagnosis not present

## 2020-02-10 DIAGNOSIS — Z9049 Acquired absence of other specified parts of digestive tract: Secondary | ICD-10-CM | POA: Diagnosis not present

## 2020-02-16 DIAGNOSIS — Z6841 Body Mass Index (BMI) 40.0 and over, adult: Secondary | ICD-10-CM | POA: Diagnosis not present

## 2020-02-16 DIAGNOSIS — I10 Essential (primary) hypertension: Secondary | ICD-10-CM | POA: Diagnosis not present

## 2020-02-16 DIAGNOSIS — R0602 Shortness of breath: Secondary | ICD-10-CM | POA: Diagnosis not present

## 2020-02-16 DIAGNOSIS — Z299 Encounter for prophylactic measures, unspecified: Secondary | ICD-10-CM | POA: Diagnosis not present

## 2020-03-26 ENCOUNTER — Other Ambulatory Visit: Payer: Self-pay | Admitting: Cardiology

## 2020-04-01 ENCOUNTER — Other Ambulatory Visit: Payer: Self-pay | Admitting: Internal Medicine

## 2020-04-04 ENCOUNTER — Other Ambulatory Visit (HOSPITAL_COMMUNITY): Payer: Self-pay | Admitting: Cardiology

## 2020-04-12 ENCOUNTER — Other Ambulatory Visit: Payer: Self-pay

## 2020-04-12 ENCOUNTER — Encounter (HOSPITAL_COMMUNITY): Payer: Self-pay | Admitting: Nurse Practitioner

## 2020-04-12 ENCOUNTER — Ambulatory Visit (HOSPITAL_COMMUNITY)
Admission: RE | Admit: 2020-04-12 | Discharge: 2020-04-12 | Disposition: A | Discharge status: Home / Self Care | Payer: Medicare Other | Source: Ambulatory Visit | Attending: Nurse Practitioner | Admitting: Nurse Practitioner

## 2020-04-12 VITALS — BP 136/76 | HR 67 | Ht 65.0 in | Wt 260.2 lb

## 2020-04-12 DIAGNOSIS — Z7901 Long term (current) use of anticoagulants: Secondary | ICD-10-CM | POA: Diagnosis not present

## 2020-04-12 DIAGNOSIS — Z87891 Personal history of nicotine dependence: Secondary | ICD-10-CM | POA: Insufficient documentation

## 2020-04-12 DIAGNOSIS — I10 Essential (primary) hypertension: Secondary | ICD-10-CM | POA: Insufficient documentation

## 2020-04-12 DIAGNOSIS — I2510 Atherosclerotic heart disease of native coronary artery without angina pectoris: Secondary | ICD-10-CM | POA: Insufficient documentation

## 2020-04-12 DIAGNOSIS — D6869 Other thrombophilia: Secondary | ICD-10-CM

## 2020-04-12 DIAGNOSIS — E669 Obesity, unspecified: Secondary | ICD-10-CM | POA: Diagnosis not present

## 2020-04-12 DIAGNOSIS — I480 Paroxysmal atrial fibrillation: Secondary | ICD-10-CM | POA: Diagnosis not present

## 2020-04-12 DIAGNOSIS — Z6841 Body Mass Index (BMI) 40.0 and over, adult: Secondary | ICD-10-CM | POA: Diagnosis not present

## 2020-04-12 NOTE — Progress Notes (Signed)
Primary Care Physician: Monico Blitz, MD Referring Physician: [facility] triage Cardiologist: Dr. Doran Durand Brooke Luna is a 74 y.o. female with a h/o HTN, CAD, obesity as well as  paroxsymal afib since 2016. She has been scheduled for a few cardioversions but always returns to [place] before the procedure.   She is on xarelto 20 mg qd for a chadsvasc score of at least 4.   She does not smoke or drink alcohol, minimum caffeine. She does snore. She states that she sleeps in a recliner because of her back and she doesn't breath well on her back.   She in in the afib clinic today as her afib burden has increased to the point that sh is now persistent x 3 weeks and wants options to explore how to mange going forward. AAD's were discussed but pt did not want to purse.  F/u in afib clinic, 05/03/19. She called to be seen for afib for the last 4-5 days. She has increased shortness of breath, and fatigue. She  would want an ablation. This would not be by guidelines to use AAD's first. She would not be an candidate for flecainide or Multaq  because of reduced  EF. She does not like the SE profile of amiodarone and Celexa may cause an issue with  qtc with Tikosyn or  Sotalol. She is not very willing to consider changing Celexa.  For the time being I will schedule cardioversion. She would like to skip antiarrythmic's and be considered for ablation. I will get her an appointment with EP after cardioversion. She is using 30 mg Cardizem every 4 hours to control v rates. She continues on xarelto for CHA2DS2VASc score of 4.   F/u in afib clinic,05/16/19. She had a successful cardioversion 05/09/19. She continues in SR and feels improved.   F/u in afib clinic, 07/05/19. She is one month s/p ablation and feels great. No afib to report.EKG shows SR. No groin or swallowing issues. Continues on xarelto 20 mg qd without interruption with a CHA2DS2VASc of 2.  F/u in afib clinic, 04/12/20. She had afib in  August. This was in the setting of her husband having covid and being hospitalized and having an argument with her husband's daughter, which made her very upset. She presented to the  ER with afib with  RVR and was admitted for observation. She tested positive for covid at that time but pt states she  did not have any symptoms. She is vaccinated. No afib since that time. She has not been able to loose weight and still has some shortness of breath with walking for a  long distance, probably related to weight and inactivity.   Today, she denies symptoms of palpitations, chest pain,+ shortness of breath,- orthopnea, PND, lower extremity edema, dizziness, presyncope, syncope, or neurologic sequela. The patient is tolerating medications without difficulties and is otherwise without complaint today.   Past Medical History:  Diagnosis Date  . Arthritis    pain and oa  right knee and pain in rt leg and foot--also pain left knee-but right is worse  . Atrial fibrillation ([place])   . Back pain    lumbar bulging disk  . Contact dermatitis    underside of left leg--always in the same spots--comes and goes  . Diverticulosis   . GERD (gastroesophageal reflux disease)    diet control  . Hemorrhoids   . Obesity   . Persistent atrial fibrillation [facility])    Past Surgical History:  Procedure Laterality Date  . ATRIAL FIBRILLATION ABLATION N/A 06/07/2019   Procedure: ATRIAL FIBRILLATION ABLATION;  Surgeon: Thompson Grayer, MD;  Location: [place] CV LAB;  Service: Cardiovascular;  Laterality: N/A;  . CARDIOVERSION N/A 05/09/2019   Procedure: CARDIOVERSION;  Surgeon: Buford Dresser, MD;  Location: [facility] ENDOSCOPY;  Service: Cardiovascular;  Laterality: N/A;  . CATARACT EXTRACTION W/PHACO Left 02/22/2015   Procedure: CATARACT EXTRACTION PHACO AND INTRAOCULAR LENS PLACEMENT LEFT EYE CDE=5.41;  Surgeon: Tonny Branch, MD;  Location: [facility];  Service: Ophthalmology;  Laterality: Left;  . CATARACT EXTRACTION W/PHACO  Right 03/26/2015   Procedure: CATARACT EXTRACTION PHACO AND INTRAOCULAR LENS PLACEMENT (IOC);  Surgeon: Tonny Branch, MD;  Location: [facility] ORS;  Service: Ophthalmology;  Laterality: Right;  CDE:5.01  . CHOLECYSTECTOMY    . surgery for broken right elbow Right   . TOTAL KNEE ARTHROPLASTY  11/03/2011   Procedure: TOTAL KNEE ARTHROPLASTY;  Surgeon: Gearlean Alf, MD;  Location: [facility] ORS;  Service: Orthopedics;  Laterality: Right;  . TUBAL LIGATION      Current Outpatient Medications  Medication Sig Dispense Refill  . albuterol (VENTOLIN HFA) 108 (90 Base) MCG/ACT inhaler Inhale 1-2 puffs into the lungs as needed.    . citalopram (CELEXA) 20 MG tablet Take 20 mg by mouth at bedtime.    Marland Kitchen diltiazem (CARDIZEM CD) 240 MG 24 hr capsule TAKE 1 CAPSULE (240 MG TOTAL) BY MOUTH AT BEDTIME. 90 capsule 1  . diltiazem (CARDIZEM) 30 MG tablet Take 1 tablet (30 mg total) by mouth every 8 (eight) hours as needed (palpitations).    . diphenhydrAMINE (BENADRYL) 25 MG tablet Take 25 mg by mouth daily as needed for allergies or sleep.     . diphenhydrAMINE-APAP, sleep, (EXCEDRIN PM) 38-500 MG TABS Take 1 tablet by mouth at bedtime as needed (sleep/pain).    . furosemide (LASIX) 20 MG tablet TAKE 1 TABLET BY MOUTH EVERY DAY 90 tablet 1  . Multiple Vitamin (MULTIVITAMIN WITH MINERALS) TABS tablet Take 1 tablet by mouth once a week.    . rivaroxaban (XARELTO) 20 MG TABS tablet Take 20 mg by mouth daily with supper.    . clonazePAM (KLONOPIN) 0.5 MG tablet Take 0.5 mg by mouth 2 (two) times daily as needed for anxiety.  (Patient not taking: Reported on 04/12/2020)     No current facility-administered medications for this encounter.    Allergies  Allergen Reactions  . Codeine Shortness Of Breath and Itching  . Meloxicam Other (See Comments)    Stomach iritation     Social History   Socioeconomic History  . Marital status: Married    Spouse name: Not on file  . Number of children: Not on file  . Years of  education: Not on file  . Highest education level: Not on file  Occupational History  . Not on file  Tobacco Use  . Smoking status: Former Smoker    Packs/day: 1.00    Years: 38.00    Pack years: 38.00    Start date: 06/25/1957    Quit date: 04/28/1996    Years since quitting: 23.9  . Smokeless tobacco: Never Used  . Tobacco comment: quit smoking 1998  Vaping Use  . Vaping Use: Never used  Substance and Sexual Activity  . Alcohol use: Yes    Alcohol/week: 1.0 standard drink    Types: 1 Cans of beer per week  . Drug use: No  . Sexual activity: Not on file  Other Topics Concern  . Not  on file  Social History Narrative   Lives in [place] with spouse.   Retired.   Social Determinants of Health   Financial Resource Strain: Not on file  Food Insecurity: Not on file  Transportation Needs: Not on file  Physical Activity: Not on file  Stress: Not on file  Social Connections: Not on file  Intimate Partner Violence: Not on file    Family History  Problem Relation Age of Onset  . Diabetes Mother   . Stroke Mother   . Stroke Father     ROS- All systems are reviewed and negative except as per the HPI above  Physical Exam: Vitals:   04/12/20 1346  Weight: 118 kg  Height: 5\' 5"  (1.651 m)   Wt Readings from Last 3 Encounters:  04/12/20 118 kg  12/30/19 117.3 kg  12/16/19 118.9 kg    Labs: Lab Results  Component Value Date   NA 140 12/17/2019   K 4.3 12/17/2019   CL 105 12/17/2019   CO2 27 12/17/2019   GLUCOSE 106 (H) 12/17/2019   BUN 10 12/17/2019   CREATININE 0.75 12/17/2019   CALCIUM 8.3 (L) 12/17/2019   MG 2.1 12/16/2019   Lab Results  Component Value Date   INR 0.95 10/23/2011   No results found for: CHOL, HDL, LDLCALC, TRIG   GEN- The patient is well appearing, alert and oriented x 3 today.   Head- normocephalic, atraumatic Eyes-  Sclera clear, conjunctiva pink Ears- hearing intact Oropharynx- clear Neck- supple, no JVP Lymph- no cervical  lymphadenopathy Lungs- Clear to ausculation bilaterally, normal work of breathing Heart- regular rate and rhythm, no murmurs, rubs or gallops, PMI not laterally displaced GI- soft, NT, ND, + BS Extremities- no clubbing, cyanosis, left lower extremity edema worse than right(1+/trace)  MS- no significant deformity or atrophy Skin- no rash or lesion Psych- euthymic mood, full affect Neuro- strength and sensation are intact  EKG-  NSR at 67 bpm, pr int 160 ms, qrs int 84 ms qtc 443 ms Epic records reviewed Echo- Study Conclusions 01/17/19- EF abnormal at 45-50%      Assessment and Plan: 1. Paroxysmal afib  S/p ablation 06/07/19 She is doing well maintaining SR Continue daily cardizem 240 mg   2.CHA2DS2VASc score of 2 Continue xarelto for chadsvasc score of 4    F/u Dr. Rayann Heman  06/30/19  Geroge Baseman. Graviel Payeur, [facility] [address] [place], [place] [address] [phone]

## 2020-04-17 ENCOUNTER — Ambulatory Visit: Payer: Medicare Other | Admitting: Cardiology

## 2020-04-19 DIAGNOSIS — J069 Acute upper respiratory infection, unspecified: Secondary | ICD-10-CM | POA: Diagnosis not present

## 2020-04-19 DIAGNOSIS — Z6841 Body Mass Index (BMI) 40.0 and over, adult: Secondary | ICD-10-CM | POA: Diagnosis not present

## 2020-04-19 DIAGNOSIS — Z299 Encounter for prophylactic measures, unspecified: Secondary | ICD-10-CM | POA: Diagnosis not present

## 2020-05-26 IMAGING — DX DG CHEST 2V
2 series · 2 of 2 positions shown · non-contrast
Comparison: 09/30/2016

CLINICAL DATA: Chest pain for 2 days

EXAM:
CHEST - 2 VIEW

[chest pa]
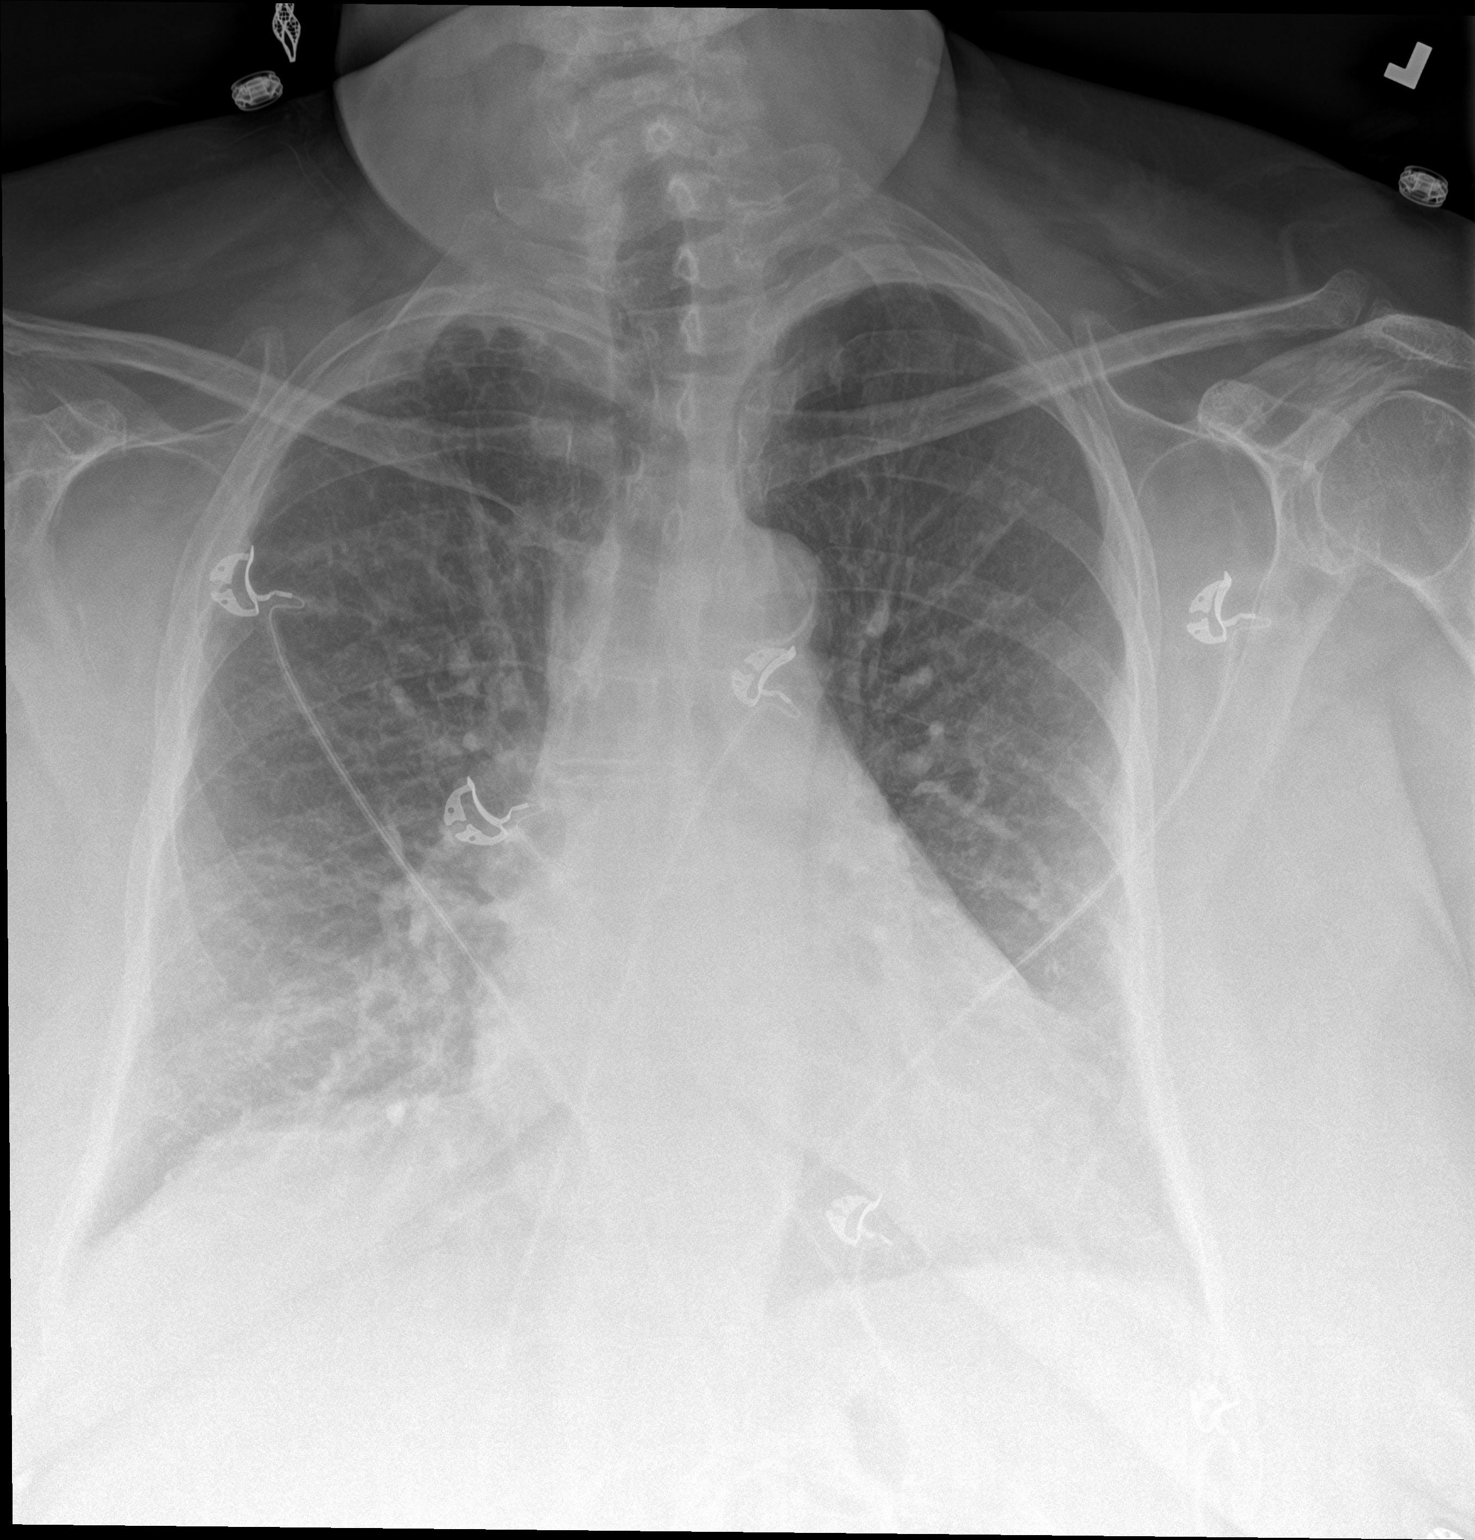

[chest lat]
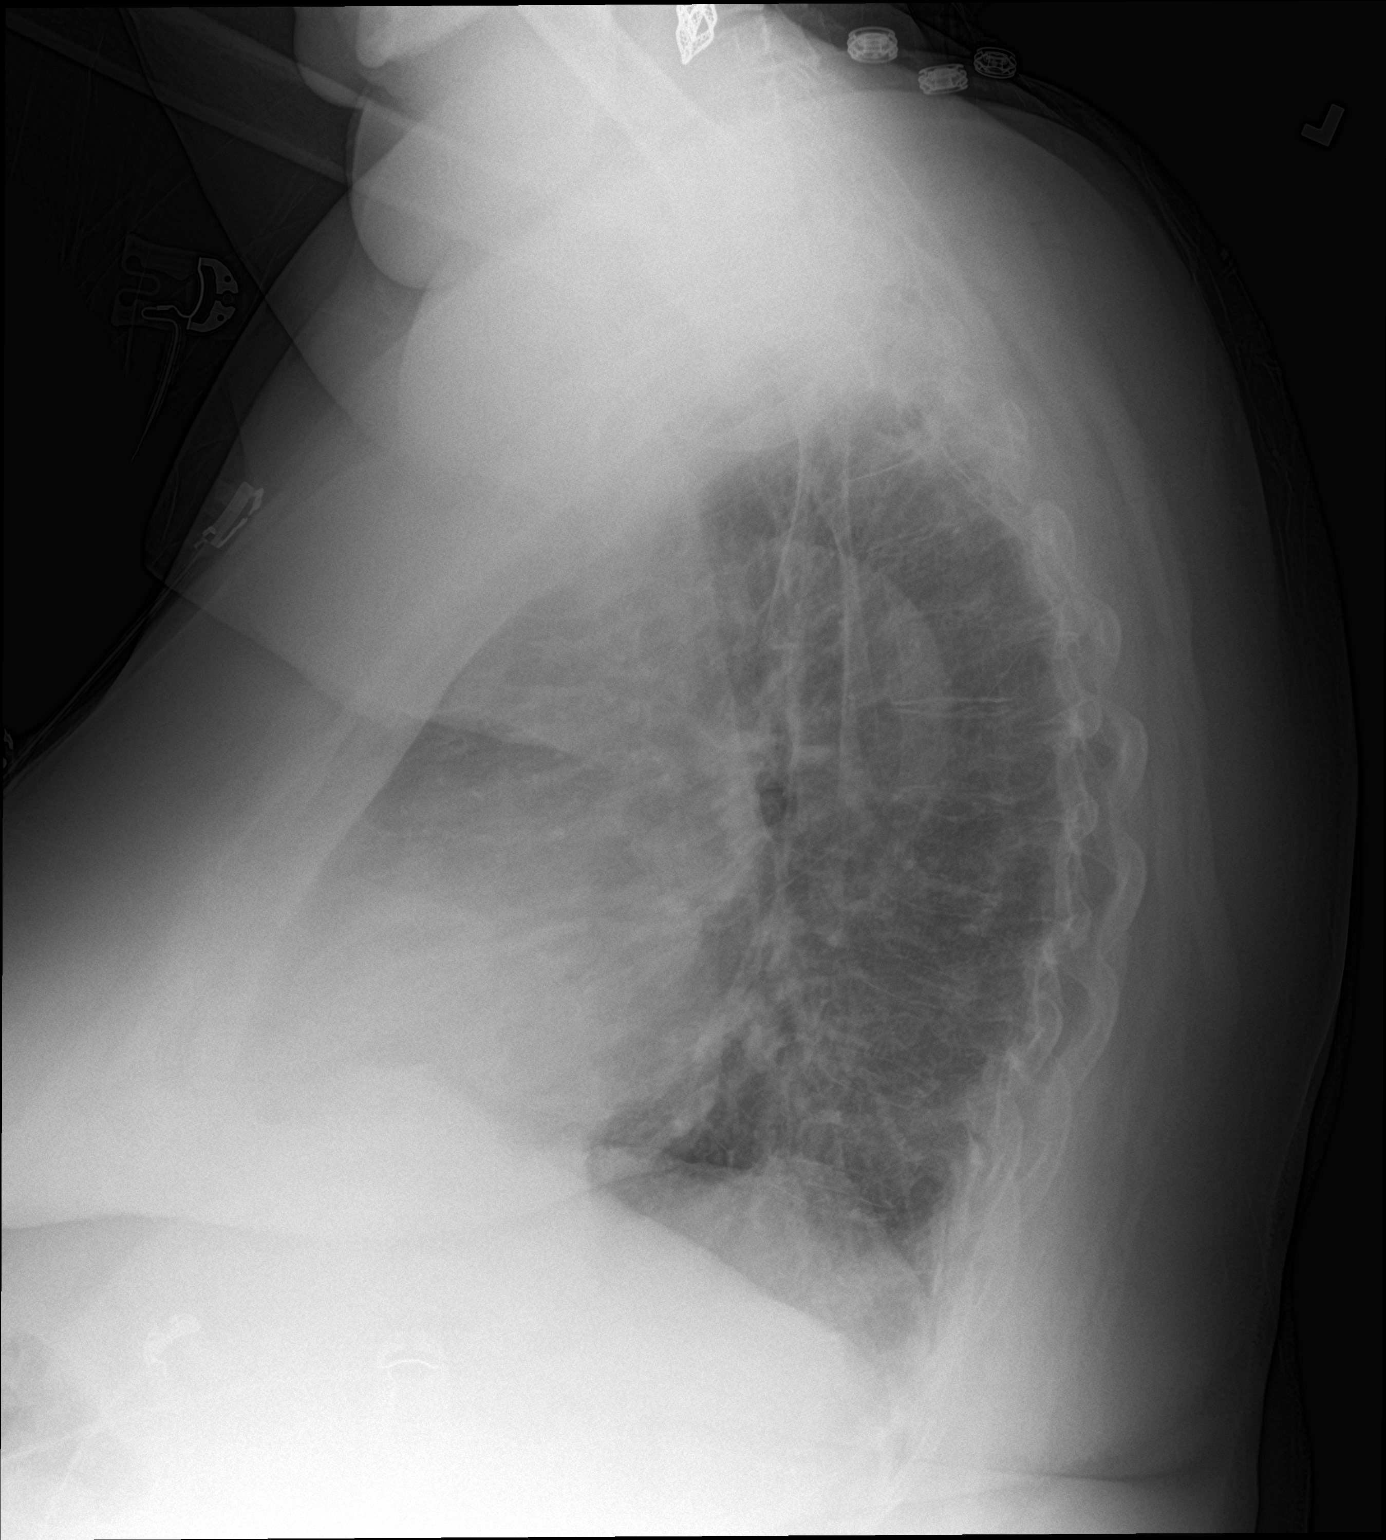

[2 of 2 positions shown; findings below may reference images not displayed]

FINDINGS: Mild cardiomegaly. No confluent airspace opacities, effusions or
edema. No acute bony abnormality.
IMPRESSION: Mild cardiomegaly.  No acute findings.

## 2020-06-01 ENCOUNTER — Ambulatory Visit (INDEPENDENT_AMBULATORY_CARE_PROVIDER_SITE_OTHER): Payer: Medicare Other | Admitting: Internal Medicine

## 2020-06-01 ENCOUNTER — Encounter: Payer: Self-pay | Admitting: Internal Medicine

## 2020-06-01 VITALS — BP 118/60 | HR 71 | Ht 65.0 in | Wt 264.0 lb

## 2020-06-01 DIAGNOSIS — D6869 Other thrombophilia: Secondary | ICD-10-CM

## 2020-06-01 DIAGNOSIS — I10 Essential (primary) hypertension: Secondary | ICD-10-CM

## 2020-06-01 DIAGNOSIS — I4891 Unspecified atrial fibrillation: Secondary | ICD-10-CM

## 2020-06-01 NOTE — Progress Notes (Signed)
PCP: Monico Blitz, MD   Primary EP: Dr Rayann Heman  Brooke Luna is a 75 y.o. female who presents today for routine electrophysiology followup.  Since last being seen in our clinic, the patient reports doing very well.  She has very rare palpitations.  SOB is stable.  She is not very active. Today, she denies symptoms of chest pain,   lower extremity edema, dizziness, presyncope, or syncope.  The patient is otherwise without complaint today.   Past Medical History:  Diagnosis Date  . Arthritis    pain and oa  right knee and pain in rt leg and foot--also pain left knee-but right is worse  . Atrial fibrillation ([place])   . Back pain    lumbar bulging disk  . Contact dermatitis    underside of left leg--always in the same spots--comes and goes  . Diverticulosis   . GERD (gastroesophageal reflux disease)    diet control  . Hemorrhoids   . Obesity   . Persistent atrial fibrillation [facility])    Past Surgical History:  Procedure Laterality Date  . ATRIAL FIBRILLATION ABLATION N/A 06/07/2019   Procedure: ATRIAL FIBRILLATION ABLATION;  Surgeon: Thompson Grayer, MD;  Location: [place] CV LAB;  Service: Cardiovascular;  Laterality: N/A;  . CARDIOVERSION N/A 05/09/2019   Procedure: CARDIOVERSION;  Surgeon: Buford Dresser, MD;  Location[facility] ENDOSCOPY;  Service: Cardiovascular;  Laterality: N/A;  . CATARACT EXTRACTION W/PHACO Left 02/22/2015   Procedure: CATARACT EXTRACTION PHACO AND INTRAOCULAR LENS PLACEMENT LEFT EYE CDE=5.41;  Surgeon: Tonny Branch, MD;  Location: [facility];  Service: Ophthalmology;  Laterality: Left;  . CATARACT EXTRACTION W/PHACO Right 03/26/2015   Procedure: CATARACT EXTRACTION PHACO AND INTRAOCULAR LENS PLACEMENT (IOC);  Surgeon: Tonny Branch, MD;  Location: [facility];  Service: Ophthalmology;  Laterality: Right;  CDE:5.01  . CHOLECYSTECTOMY    . surgery for broken right elbow Right   . TOTAL KNEE ARTHROPLASTY  11/03/2011   Procedure: TOTAL KNEE ARTHROPLASTY;  Surgeon:  Gearlean Alf, MD;  Location: [facility] ORS;  Service: Orthopedics;  Laterality: Right;  . TUBAL LIGATION      ROS- all systems are reviewed and negatives except as per HPI above  Current Outpatient Medications  Medication Sig Dispense Refill  . albuterol (VENTOLIN HFA) 108 (90 Base) MCG/ACT inhaler Inhale 1-2 puffs into the lungs as needed.    . citalopram (CELEXA) 20 MG tablet Take 20 mg by mouth at bedtime.    . clonazePAM (KLONOPIN) 0.5 MG tablet Take 0.5 mg by mouth 2 (two) times daily as needed for anxiety.    Marland Kitchen diltiazem (CARDIZEM CD) 240 MG 24 hr capsule TAKE 1 CAPSULE (240 MG TOTAL) BY MOUTH AT BEDTIME. 90 capsule 1  . diltiazem (CARDIZEM) 30 MG tablet Take 1 tablet (30 mg total) by mouth every 8 (eight) hours as needed (palpitations).    . diphenhydrAMINE (BENADRYL) 25 MG tablet Take 25 mg by mouth daily as needed for allergies or sleep.     . diphenhydrAMINE-APAP, sleep, (EXCEDRIN PM) 38-500 MG TABS Take 1 tablet by mouth at bedtime as needed (sleep/pain).    . furosemide (LASIX) 20 MG tablet TAKE 1 TABLET BY MOUTH EVERY DAY 90 tablet 1  . Multiple Vitamin (MULTIVITAMIN WITH MINERALS) TABS tablet Take 1 tablet by mouth once a week.    . rivaroxaban (XARELTO) 20 MG TABS tablet Take 20 mg by mouth daily with supper.     No current facility-administered medications for this visit.    Physical Exam:  Vitals:   06/01/20 0930  BP: 118/60  Pulse: 71  SpO2: 90%  Weight: 264 lb (119.7 kg)  Height: 5\' 5"  (1.651 m)    GEN- The patient is well appearing, alert and oriented x 3 today.   Head- normocephalic, atraumatic Eyes-  Sclera clear, conjunctiva pink Ears- hearing intact Oropharynx- clear Lungs- Clear to ausculation bilaterally, normal work of breathing Heart- Regular rate and rhythm, no murmurs, rubs or gallops, PMI not laterally displaced GI- soft, NT, ND, + BS Extremities- no clubbing, cyanosis, or edema  Wt Readings from Last 3 Encounters:  06/01/20 264 lb (119.7 kg)   04/12/20 260 lb 3.2 oz (118 kg)  12/30/19 258 lb 9.6 oz (117.3 kg)    EKG tracing ordered today is personally reviewed and shows sinus  Assessment and Plan:  1. Persistent atrial fibrillation Doing well post ablation off AAD therapy chads2vasc score is 4.  She is on xarelto [employer], cbc today  2. HTN Stable No change required today  3. Obesity Body mass index is 43.93 kg/m. Lifestyle modification is advised  Risks, benefits and potential toxicities for medications prescribed and/or refilled reviewed with patient today.   AF clinic in 6 months I will see in a year  Thompson Grayer MD, [facility] 06/01/2020 9:50 AM

## 2020-06-01 NOTE — Patient Instructions (Signed)
Medication Instructions:  Continue all current medications.  Labwork:  BMET, CBC - orders given today.   Office will contact with results via phone or letter.     Testing/Procedures: none  Follow-Up:  6 months - Afib clinic  1 year - [facility]   Any Other Special Instructions Will Be Listed Below (If Applicable).  If you need a refill on your cardiac medications before your next appointment, please call your pharmacy.

## 2020-06-08 ENCOUNTER — Other Ambulatory Visit: Payer: Self-pay | Admitting: Internal Medicine

## 2020-06-09 LAB — CBC WITH DIFFERENTIAL/PLATELET
Basophils Absolute: 0.1 10*3/uL (ref 0.0–0.2)
Basos: 1 %
EOS (ABSOLUTE): 0.4 10*3/uL (ref 0.0–0.4)
Eos: 5 %
Hematocrit: 42.6 % (ref 34.0–46.6)
Hemoglobin: 14.3 g/dL (ref 11.1–15.9)
Immature Grans (Abs): 0 10*3/uL (ref 0.0–0.1)
Immature Granulocytes: 0 %
Lymphocytes Absolute: 1.8 10*3/uL (ref 0.7–3.1)
Lymphs: 19 %
MCH: 27.9 pg (ref 26.6–33.0)
MCHC: 33.6 g/dL (ref 31.5–35.7)
MCV: 83 fL (ref 79–97)
Monocytes Absolute: 0.5 10*3/uL (ref 0.1–0.9)
Monocytes: 6 %
Neutrophils Absolute: 6.4 10*3/uL (ref 1.4–7.0)
Neutrophils: 69 %
Platelets: 336 10*3/uL (ref 150–450)
RBC: 5.13 x10E6/uL (ref 3.77–5.28)
RDW: 13.3 % (ref 11.7–15.4)
WBC: 9.2 10*3/uL (ref 3.4–10.8)

## 2020-06-09 LAB — BASIC METABOLIC PANEL (7)
BUN/Creatinine Ratio: 10 — ABNORMAL LOW (ref 12–28)
BUN: 8 mg/dL (ref 8–27)
CO2: 25 mmol/L (ref 20–29)
Chloride: 101 mmol/L (ref 96–106)
Creatinine, Ser: 0.82 mg/dL (ref 0.57–1.00)
GFR calc Af Amer: 82 mL/min/{1.73_m2} (ref >59)
GFR calc non Af Amer: 71 mL/min/{1.73_m2} (ref >59)
Glucose: 110 mg/dL — ABNORMAL HIGH (ref 65–99)
Potassium: 4.5 mmol/L (ref 3.5–5.2)
Sodium: 142 mmol/L (ref 134–144)

## 2020-06-29 ENCOUNTER — Ambulatory Visit: Payer: Medicare Other | Admitting: Internal Medicine

## 2020-07-16 ENCOUNTER — Other Ambulatory Visit: Payer: Self-pay

## 2020-07-16 ENCOUNTER — Emergency Department (HOSPITAL_COMMUNITY)
Admission: EM | Admit: 2020-07-16 | Discharge: 2020-07-17 | Disposition: A | Discharge status: Home / Self Care | Payer: Medicare Other | Attending: Emergency Medicine | Admitting: Emergency Medicine

## 2020-07-16 DIAGNOSIS — Z87891 Personal history of nicotine dependence: Secondary | ICD-10-CM | POA: Insufficient documentation

## 2020-07-16 DIAGNOSIS — R002 Palpitations: Secondary | ICD-10-CM | POA: Diagnosis present

## 2020-07-16 DIAGNOSIS — R0602 Shortness of breath: Secondary | ICD-10-CM | POA: Diagnosis not present

## 2020-07-16 DIAGNOSIS — Z96651 Presence of right artificial knee joint: Secondary | ICD-10-CM | POA: Diagnosis not present

## 2020-07-16 DIAGNOSIS — Z7901 Long term (current) use of anticoagulants: Secondary | ICD-10-CM | POA: Diagnosis not present

## 2020-07-16 DIAGNOSIS — I4819 Other persistent atrial fibrillation: Secondary | ICD-10-CM | POA: Diagnosis not present

## 2020-07-17 ENCOUNTER — Other Ambulatory Visit: Payer: Self-pay

## 2020-07-17 ENCOUNTER — Encounter (HOSPITAL_COMMUNITY): Payer: Self-pay | Admitting: Emergency Medicine

## 2020-07-17 ENCOUNTER — Emergency Department (HOSPITAL_COMMUNITY): Payer: Medicare Other

## 2020-07-17 LAB — TROPONIN I (HIGH SENSITIVITY)
Troponin I (High Sensitivity): 6 ng/L (ref <18)
Troponin I (High Sensitivity): 6 ng/L (ref <18)

## 2020-07-17 LAB — BRAIN NATRIURETIC PEPTIDE: B Natriuretic Peptide: 141 pg/mL — ABNORMAL HIGH (ref 0.0–100.0)

## 2020-07-17 LAB — CBC
HCT: 46.2 % — ABNORMAL HIGH (ref 36.0–46.0)
Hemoglobin: 14.7 g/dL (ref 12.0–15.0)
MCH: 28.3 pg (ref 26.0–34.0)
MCHC: 31.8 g/dL (ref 30.0–36.0)
MCV: 88.8 fL (ref 80.0–100.0)
Platelets: 449 10*3/uL — ABNORMAL HIGH (ref 150–400)
RBC: 5.2 MIL/uL — ABNORMAL HIGH (ref 3.87–5.11)
RDW: 13.7 % (ref 11.5–15.5)
WBC: 13.8 10*3/uL — ABNORMAL HIGH (ref 4.0–10.5)
nRBC: 0 % (ref 0.0–0.2)

## 2020-07-17 LAB — BASIC METABOLIC PANEL
Anion gap: 10 (ref 5–15)
BUN: 11 mg/dL (ref 8–23)
CO2: 26 mmol/L (ref 22–32)
Calcium: 8.6 mg/dL — ABNORMAL LOW (ref 8.9–10.3)
Chloride: 102 mmol/L (ref 98–111)
Creatinine, Ser: 0.95 mg/dL (ref 0.44–1.00)
GFR, Estimated: >60 mL/min (ref >60)
Glucose, Bld: 143 mg/dL — ABNORMAL HIGH (ref 70–99)
Potassium: 3.5 mmol/L (ref 3.5–5.1)
Sodium: 138 mmol/L (ref 135–145)

## 2020-07-17 MED ORDER — DILTIAZEM HCL 25 MG/5ML IV SOLN
15.0000 mg | Freq: Once | INTRAVENOUS | Status: DC
  Filled 2020-07-17: qty 5

## 2020-07-17 MED ORDER — SODIUM CHLORIDE 0.9% FLUSH
3.0000 mL | Freq: Once | INTRAVENOUS | Status: AC
  Administered 2020-07-17: 3 mL via INTRAVENOUS

## 2020-07-17 MED ORDER — DILTIAZEM HCL-DEXTROSE 125-5 MG/125ML-% IV SOLN (PREMIX): 5.0000 mg/h | INTRAVENOUS | Status: DC

## 2020-07-17 MED ORDER — DILTIAZEM HCL 25 MG/5ML IV SOLN
10.0000 mg | Freq: Once | INTRAVENOUS | Status: AC
  Administered 2020-07-17: 10 mg via INTRAVENOUS

## 2020-07-17 MED ORDER — DILTIAZEM HCL ER COATED BEADS 360 MG PO CP24: 360.0000 mg | ORAL_CAPSULE | Freq: Every day | ORAL | 0 refills | Status: DC

## 2020-07-17 NOTE — ED Notes (Signed)
When entering room to admin it is noted that pt is now in sinus rhythm at 80bpm.

## 2020-07-17 NOTE — ED Provider Notes (Signed)
[facility] EMERGENCY DEPARTMENT Provider Note   CSN: [id number] Arrival date & time: 07/16/20  2351     History Chief Complaint  Patient presents with  . Chest Pain    Brooke Luna is a 75 y.o. female.  Patient with history of atrial fibrillation, chronic pain, acid reflux disease here with palpitations for the past 3 days.  Believe that she is in atrial fibrillation again.  States she had constant palpitations for the past 3 days despite taking her medications occluding Cardizem 240 mg as well as Cardizem 30 mg as needed for palpitations.  She is taken for 5 of the as needed tablets today.  She gets occasional "twinges" of chest pain to her left side last for a few seconds and resolved.  Palpitations are associated with shortness of breath and nausea.  No vomiting, sweating or diaphoresis. No fever.  No abdominal pain. States compliance with all his medications and denies any missed Xarelto doses.   The history is provided by the patient.  Chest Pain Associated symptoms: palpitations   Associated symptoms: no abdominal pain, no cough, no dizziness, no fever, no headache, no nausea, no shortness of breath, no vomiting and no weakness        Past Medical History:  Diagnosis Date  . Arthritis    pain and oa  right knee and pain in rt leg and foot--also pain left knee-but right is worse  . Atrial fibrillation ([place])   . Back pain    lumbar bulging disk  . Contact dermatitis    underside of left leg--always in the same spots--comes and goes  . Diverticulosis   . GERD (gastroesophageal reflux disease)    diet control  . Hemorrhoids   . Obesity   . Persistent atrial fibrillation [facility])     Patient Active Problem List   Diagnosis Date Noted  . Atrial fibrillation with RVR ([place]) 12/16/2019  . Atrial fibrillation ([place])   . Varicose veins of lower extremities with other complications 40/98/1191  . Chronic venous insufficiency 12/17/2012  . Postop Hyponatremia  11/05/2011  . OA (osteoarthritis) of knee 11/03/2011    Past Surgical History:  Procedure Laterality Date  . ATRIAL FIBRILLATION ABLATION N/A 06/07/2019   Procedure: ATRIAL FIBRILLATION ABLATION;  Surgeon: Thompson Grayer, MD;  Location: [place] CV LAB;  Service: Cardiovascular;  Laterality: N/A;  . CARDIOVERSION N/A 05/09/2019   Procedure: CARDIOVERSION;  Surgeon: Buford Dresser, MD;  Location: [facility] ENDOSCOPY;  Service: Cardiovascular;  Laterality: N/A;  . CATARACT EXTRACTION W/PHACO Left 02/22/2015   Procedure: CATARACT EXTRACTION PHACO AND INTRAOCULAR LENS PLACEMENT LEFT EYE CDE=5.41;  Surgeon: Tonny Branch, MD;  Location: [facility];  Service: Ophthalmology;  Laterality: Left;  . CATARACT EXTRACTION W/PHACO Right 03/26/2015   Procedure: CATARACT EXTRACTION PHACO AND INTRAOCULAR LENS PLACEMENT (IOC);  Surgeon: Tonny Branch, MD;  Location: [facility] ORS;  Service: Ophthalmology;  Laterality: Right;  CDE:5.01  . CHOLECYSTECTOMY    . surgery for broken right elbow Right   . TOTAL KNEE ARTHROPLASTY  11/03/2011   Procedure: TOTAL KNEE ARTHROPLASTY;  Surgeon: Gearlean Alf, MD;  Location: [facility] ORS;  Service: Orthopedics;  Laterality: Right;  . TUBAL LIGATION       OB History   No obstetric history on file.     Family History  Problem Relation Age of Onset  . Diabetes Mother   . Stroke Mother   . Stroke Father     Social History   Tobacco Use  . Smoking status:  Former Smoker    Packs/day: 1.00    Years: 38.00    Pack years: 38.00    Start date: 06/25/1957    Quit date: 04/28/1996    Years since quitting: 24.2  . Smokeless tobacco: Never Used  . Tobacco comment: quit smoking 1998  Vaping Use  . Vaping Use: Never used  Substance Use Topics  . Alcohol use: Yes    Alcohol/week: 1.0 standard drink    Types: 1 Cans of beer per week  . Drug use: No    Home Medications Prior to Admission medications   Medication Sig Start Date End Date Taking? Authorizing Provider  albuterol (VENTOLIN  HFA) 108 (90 Base) MCG/ACT inhaler Inhale 1-2 puffs into the lungs as needed. 02/09/20   [provider]  citalopram (CELEXA) 20 MG tablet Take 20 mg by mouth at bedtime. 10/28/12   [provider]  clonazePAM (KLONOPIN) 0.5 MG tablet Take 0.5 mg by mouth 2 (two) times daily as needed for anxiety.    [provider]  diltiazem (CARDIZEM CD) 240 MG 24 hr capsule TAKE 1 CAPSULE (240 MG TOTAL) BY MOUTH AT BEDTIME. 03/27/20   Branch, Alphonse Guild, MD  diltiazem (CARDIZEM) 30 MG tablet Take 1 tablet (30 mg total) by mouth every 8 (eight) hours as needed (palpitations). 12/17/19   Johnson, Clanford L, MD  diphenhydrAMINE (BENADRYL) 25 MG tablet Take 25 mg by mouth daily as needed for allergies or sleep.     [provider]  diphenhydrAMINE-APAP, sleep, (EXCEDRIN PM) 38-500 MG TABS Take 1 tablet by mouth at bedtime as needed (sleep/pain).    [provider]  furosemide (LASIX) 20 MG tablet TAKE 1 TABLET BY MOUTH EVERY DAY 04/04/20   Arnoldo Lenis, MD  Multiple Vitamin (MULTIVITAMIN WITH MINERALS) TABS tablet Take 1 tablet by mouth once a week.    [provider]  rivaroxaban (XARELTO) 20 MG TABS tablet Take 20 mg by mouth daily with supper.    [provider]    Allergies    Codeine and Meloxicam  Review of Systems   Review of Systems  Constitutional: Negative for activity change, appetite change and fever.  HENT: Negative for congestion and rhinorrhea.   Respiratory: Positive for chest tightness. Negative for cough and shortness of breath.   Cardiovascular: Positive for chest pain and palpitations.  Gastrointestinal: Negative for abdominal pain, nausea and vomiting.  Genitourinary: Negative for dysuria and hematuria.  Musculoskeletal: Negative for arthralgias and myalgias.  Skin: Negative for rash.  Neurological: Positive for light-headedness. Negative for dizziness, weakness and headaches.   all other systems are negative except as  noted in the HPI and PMH.    Physical Exam Updated Vital Signs   Pulse (!) 167   Temp 97.9 F (36.6 C) (Oral)   Resp 18   Ht 5\' 5"  (1.651 m)   Wt 117.9 kg   SpO2 95%   BMI 43.27 kg/m   Physical Exam Vitals and nursing note reviewed.  Constitutional:      General: She is not in acute distress.    Appearance: She is well-developed. She is obese. She is not ill-appearing or toxic-appearing.  HENT:     Head: Normocephalic and atraumatic.     Mouth/Throat:     Pharynx: No oropharyngeal exudate.  Eyes:     Conjunctiva/sclera: Conjunctivae normal.     Pupils: Pupils are equal, round, and reactive to light.  Neck:     Comments: No meningismus. Cardiovascular:  Rate and Rhythm: Tachycardia present. Rhythm irregular.     Heart sounds: Normal heart sounds. No murmur heard.   Pulmonary:     Effort: Pulmonary effort is normal. No respiratory distress.     Breath sounds: Normal breath sounds.  Chest:     Chest wall: No tenderness.  Abdominal:     Palpations: Abdomen is soft.     Tenderness: There is no abdominal tenderness. There is no guarding or rebound.  Musculoskeletal:        General: No tenderness. Normal range of motion.     Cervical back: Normal range of motion and neck supple.  Skin:    General: Skin is warm.  Neurological:     Mental Status: She is alert and oriented to person, place, and time.     Cranial Nerves: No cranial nerve deficit.     Motor: No abnormal muscle tone.     Coordination: Coordination normal.     Comments:  5/5 strength throughout. CN 2-12 intact.Equal grip strength.   Psychiatric:        Behavior: Behavior normal.     ED Results / Procedures / Treatments   Labs (all labs ordered are listed, but only abnormal results are displayed) Labs Reviewed  BASIC METABOLIC PANEL - Abnormal; Notable for the following components:      Result Value   Glucose, Bld 143 (*)    Calcium 8.6 (*)    All other components within normal limits  CBC -  Abnormal; Notable for the following components:   WBC 13.8 (*)    RBC 5.20 (*)    HCT 46.2 (*)    Platelets 449 (*)    All other components within normal limits  BRAIN NATRIURETIC PEPTIDE - Abnormal; Notable for the following components:   B Natriuretic Peptide 141.0 (*)    All other components within normal limits  TROPONIN I (HIGH SENSITIVITY)  TROPONIN I (HIGH SENSITIVITY)    EKG EKG Interpretation  Date/Time:  Tuesday July 17 2020 00:39:54 EDT Ventricular Rate:  83 PR Interval:    QRS Duration: 102 QT Interval:  444 QTC Calculation: 522 R Axis:   54 Text Interpretation: Atrial flutter with varied AV block, Minimal ST depression, diffuse leads Prolonged QT interval sinus? intermittent atrial flutter Confirmed by Ezequiel Essex [phone]) on 07/17/2020 1:57:04 AM   Radiology DG Chest 2 View  Result Date: 07/17/2020 CLINICAL DATA:  Chest pain, irregular heartbeat EXAM: CHEST - 2 VIEW COMPARISON:  Radiograph 12/16/2019, CT 02/10/2020 FINDINGS: Chronically coarsened interstitial and bronchitic features are similar to comparison exam. Chronic hyperinflation flattening of the diaphragms. Redemonstrated scarring and pleural thickening in the left mid lung as well as biapical pleuroparenchymal scarring. No acute consolidative process or convincing features of edema. No pneumothorax or pleural effusion. Pulmonary vascularity is normally distributed. Stable cardiomediastinal contours with a calcified aorta. No acute osseous or soft tissue abnormality. Degenerative changes are present in the imaged spine and shoulders. Telemetry leads overlie the chest. IMPRESSION: 1. No acute cardiopulmonary disease. 2. Chronic interstitial and bronchitic features. Electronically Signed   By: Lovena Le M.D.   On: 07/17/2020 01:49    Procedures Procedures   Medications Ordered in ED Medications  diltiazem (CARDIZEM) injection 15 mg (has no administration in time range)  sodium chloride flush (NS) 0.9  % injection 3 mL (3 mLs Intravenous Given 07/17/20 0022)    ED Course  I have reviewed the triage vital signs and the nursing notes.  Pertinent labs & imaging  results that were available during my care of the patient were reviewed by me and considered in my medical decision making (see chart for details).    MDM Rules/Calculators/A&P                          Palpitations with twinges of chest pain.  She is  in   Rapid A. fib f as high as 150.    She denies any missed Xarelto doses. She has been taking her Cardizem at home without relief Risks and benefits of cardioversion d/w patient she is willing to proceed if necessary and denies any missed xarelto doses.  Before Cardizem could be initiated, patient's rate slowed into the 70s and 80s.  Repeat EKG shows apparent sinus rhythm with possible intermittent atrial flutter.  Patient feels improved. Cardiology records show that she has persistent atrial fibrillation but she does appear to be in sinus rhythm at times.  Labs are reassuring with negative troponin. Heart rate remains controlled in the 80s and 90s and is regular  EKGs is reviewed with Dr. Blossom Hoops of cardiology.  He favors sinus rhythm.  There is some underlying variability with possible PACs versus atrial flutter but rhythm is well controlled in the 70s and 80s.  Patient feels improved.  Dr. Blossom Hoops recommends increasing her Cardizem to 360 mg daily.  Rhythm remains well controlled.  Suspect sinus with likely underlying intermittent flutter. As discussed with cardiology, she is still safe for discharge either way.  Increase Cardizem. Low suspicion for ACS or pulmonary embolism. Troponin negative x2.  Follow-up with cardiology for atrial fibrillation clinic this week.  Return to the ED with difficulty breathing, recurrent chest pain, palpitations or other concerns.   Final Clinical Impression(s) / ED Diagnoses Final diagnoses:  Persistent atrial fibrillation [facility])  Palpitations     Rx / DC Orders ED Discharge Orders    None       Rancour, Annie Main, MD 07/17/20 [phone]

## 2020-07-17 NOTE — ED Triage Notes (Signed)
Pt c/o intermittent chest pain and states heart rate has been in the 160s.

## 2020-07-17 NOTE — Discharge Instructions (Signed)
Take the new Cardizem at 360 mg And stop taking 240 mg.  Follow-up with Dr. Harl Bowie for the atrial fibrillation clinic for an appointment this week.  Return to the ED with exertional chest pain, shortness of breath, increased palpitations or any other concerns.

## 2020-07-24 ENCOUNTER — Ambulatory Visit: Payer: Medicare Other | Admitting: Cardiology

## 2020-07-24 ENCOUNTER — Encounter: Payer: Self-pay | Admitting: Cardiology

## 2020-07-24 VITALS — BP 110/70 | HR 64 | Ht 64.0 in | Wt 265.2 lb

## 2020-07-24 DIAGNOSIS — I4892 Unspecified atrial flutter: Secondary | ICD-10-CM

## 2020-07-24 DIAGNOSIS — I4891 Unspecified atrial fibrillation: Secondary | ICD-10-CM | POA: Diagnosis not present

## 2020-07-24 DIAGNOSIS — I10 Essential (primary) hypertension: Secondary | ICD-10-CM | POA: Diagnosis not present

## 2020-07-24 MED ORDER — DILTIAZEM HCL ER COATED BEADS 300 MG PO CP24: 300.0000 mg | ORAL_CAPSULE | Freq: Every day | ORAL | 6 refills | Status: DC

## 2020-07-24 MED ORDER — DILTIAZEM HCL ER COATED BEADS 300 MG PO CP24: 300.0000 mg | ORAL_CAPSULE | Freq: Every day | ORAL | 2 refills | Status: DC

## 2020-07-24 NOTE — Patient Instructions (Addendum)
Medication Instructions:   Your physician has recommended you make the following change in your medication:   Stop Tiadylt 360 mg  Restart cardizem cd 300 mg by mouth daily  Continue other medications the same  Labwork:  none  Testing/Procedures:  none  Follow-Up:  Your physician recommends that you schedule a follow-up appointment in: 3 months,.  Any Other Special Instructions Will Be Listed Below (If Applicable).  If you need a refill on your cardiac medications before your next appointment, please call your pharmacy.

## 2020-07-24 NOTE — Progress Notes (Signed)
Clinical Summary Brooke Luna is a 75 y.o.female  seen today for follow up of the following medical problems.  1. PAF - diagnosed 12/2014 during hospital admission at [facility] - followed by Dr Rayann Heman, she is s/p ablation 05/2019  11/2019 aflutter admission - ER visit 07/17/20 with palpitations - EKG aflutter with variable conduction - rates were able to be controlled in ER, discharged - at home HRs back up to 160 x anouther 24 hours - no bleeding on xarelto  2. HTN - she is compliant with meds   3. CAD - nonobstructive disease by cath 2006according to prior clinic notes - admit 09/2016 to [facility] with chest painprimarily related to palpitations.  - no recent chest pain  - 05/2019 CT cardiac prior to ablation, no significant CAD   4. SOB - chronic stable SOB.  - DOE walking in from parking lot.  - remote tobacco history age 29 to age 61. - has upcoming appt with Dr Melvyn Novas. Prior PFTs showed some restrictive lung disease  5. Chronic LLE     Past Medical History:  Diagnosis Date  . Arthritis    pain and oa  right knee and pain in rt leg and foot--also pain left knee-but right is worse  . Atrial fibrillation ([place])   . Back pain    lumbar bulging disk  . Contact dermatitis    underside of left leg--always in the same spots--comes and goes  . Diverticulosis   . GERD (gastroesophageal reflux disease)    diet control  . Hemorrhoids   . Obesity   . Persistent atrial fibrillation ([facility])      Allergies  Allergen Reactions  . Codeine Shortness Of Breath and Itching  . Meloxicam Other (See Comments)    Stomach iritation      Current Outpatient Medications  Medication Sig Dispense Refill  . albuterol (VENTOLIN HFA) 108 (90 Base) MCG/ACT inhaler Inhale 1-2 puffs into the lungs as needed.    . citalopram (CELEXA) 20 MG tablet Take 20 mg by mouth at bedtime.    . clonazePAM (KLONOPIN) 0.5 MG tablet Take 0.5 mg by mouth 2 (two) times daily as  needed for anxiety.    Marland Kitchen diltiazem (CARDIZEM CD) 360 MG 24 hr capsule Take 1 capsule (360 mg total) by mouth daily. 30 capsule 0  . diltiazem (CARDIZEM) 30 MG tablet Take 1 tablet (30 mg total) by mouth every 8 (eight) hours as needed (palpitations).    . diphenhydrAMINE (BENADRYL) 25 MG tablet Take 25 mg by mouth daily as needed for allergies or sleep.     . diphenhydrAMINE-APAP, sleep, (EXCEDRIN PM) 38-500 MG TABS Take 1 tablet by mouth at bedtime as needed (sleep/pain).    . furosemide (LASIX) 20 MG tablet TAKE 1 TABLET BY MOUTH EVERY DAY 90 tablet 1  . Multiple Vitamin (MULTIVITAMIN WITH MINERALS) TABS tablet Take 1 tablet by mouth once a week.    . rivaroxaban (XARELTO) 20 MG TABS tablet Take 20 mg by mouth daily with supper.     No current facility-administered medications for this visit.     Past Surgical History:  Procedure Laterality Date  . ATRIAL FIBRILLATION ABLATION N/A 06/07/2019   Procedure: ATRIAL FIBRILLATION ABLATION;  Surgeon: Thompson Grayer, MD;  Location: [place] CV LAB;  Service: Cardiovascular;  Laterality: N/A;  . CARDIOVERSION N/A 05/09/2019   Procedure: CARDIOVERSION;  Surgeon: Buford Dresser, MD;  Location: [facility] ENDOSCOPY;  Service: Cardiovascular;  Laterality: N/A;  . CATARACT  EXTRACTION W/PHACO Left 02/22/2015   Procedure: CATARACT EXTRACTION PHACO AND INTRAOCULAR LENS PLACEMENT LEFT EYE CDE=5.41;  Surgeon: Tonny Alecxander Mainwaring, MD;  Location: [facility];  Service: Ophthalmology;  Laterality: Left;  . CATARACT EXTRACTION W/PHACO Right 03/26/2015   Procedure: CATARACT EXTRACTION PHACO AND INTRAOCULAR LENS PLACEMENT (IOC);  Surgeon: Tonny Shevon Sian, MD;  Location: [facility];  Service: Ophthalmology;  Laterality: Right;  CDE:5.01  . CHOLECYSTECTOMY    . surgery for broken right elbow Right   . TOTAL KNEE ARTHROPLASTY  11/03/2011   Procedure: TOTAL KNEE ARTHROPLASTY;  Surgeon: Gearlean Alf, MD;  Location: [facility];  Service: Orthopedics;  Laterality: Right;  . TUBAL LIGATION        Allergies  Allergen Reactions  . Codeine Shortness Of Breath and Itching  . Meloxicam Other (See Comments)    Stomach iritation       Family History  Problem Relation Age of Onset  . Diabetes Mother   . Stroke Mother   . Stroke Father      Social History [name] reports that she quit smoking about 24 years ago. She started smoking about 63 years ago. She has a 38.00 pack-year smoking history. She has never used smokeless tobacco. [name] reports current alcohol use of about 1.0 standard drink of alcohol per week.   Review of Systems CONSTITUTIONAL: No weight loss, fever, chills, weakness or fatigue.  HEENT: Eyes: No visual loss, blurred vision, double vision or yellow sclerae.No hearing loss, sneezing, congestion, runny nose or sore throat.  SKIN: No rash or itching.  CARDIOVASCULAR: per hpi RESPIRATORY: chronic SOB  GASTROINTESTINAL: No anorexia, nausea, vomiting or diarrhea. No abdominal pain or blood.  GENITOURINARY: No burning on urination, no polyuria NEUROLOGICAL: No headache, dizziness, syncope, paralysis, ataxia, numbness or tingling in the extremities. No change in bowel or bladder control.  MUSCULOSKELETAL: No muscle, back pain, joint pain or stiffness.  LYMPHATICS: No enlarged nodes. No history of splenectomy.  PSYCHIATRIC: No history of depression or anxiety.  ENDOCRINOLOGIC: No reports of sweating, cold or heat intolerance. No polyuria or polydipsia.  Marland Kitchen   Physical Examination Today's Vitals   07/24/20 1346  BP: 110/70  Pulse: 64  SpO2: 97%  Weight: 265 lb 3.2 oz (120.3 kg)  Height: 5\' 4"  (1.626 m)   Body mass index is 45.52 kg/m.  Gen: resting comfortably, no acute distress HEENT: no scleral icterus, pupils equal round and reactive, no palptable cervical adenopathy,  CV: RRR, no mr/g, no jvd Resp: Clear to auscultation bilaterally GI: abdomen is soft, non-tender, non-distended, normal bowel sounds, no hepatosplenomegaly MSK:  extremities are warm, no edema.  Skin: warm, no rash Neuro:  no focal deficits Psych: appropriate affect   Diagnostic Studies  12/2016 sleep study IMPRESSIONS - Minimal obstructive sleep apnea occurred during this study (AHI = 5.5/h) occurring primarily during supine sleep. - Mild central sleep apnea occurred during this study (CAI = 5.5/h). - The patient had minimal or no oxygen desaturation during the study (Min O2 = 100.00%) - The patient snored with Loud snoring volume. - EKG findings include Atrial Fibrillation. - Mild periodic limb movements of sleep occurred during the study. Associated arousals were significant.  DIAGNOSIS - Obstructive Sleep Apnea (327.23 [G47.33 ICD-10])  RECOMMENDATIONS - Very mild obstructive sleep apnea occurring primarily in supine position. - Recommend avoiding sleeping supine.  - Avoid alcohol, sedatives and other CNS depressants that may worsen sleep apnea and disrupt normal sleep architecture. - Sleep hygiene should be reviewed to assess factors  that may improve sleep quality. - Weight management and regular exercise should be initiated or continued if appropriate. - Consider referral to ENT for evaluation of possible surgical causes of snoring.  11/2016 echo Study Conclusions  - Left ventricle: The cavity size was normal. Wall thickness was normal. Systolic function was normal. The estimated ejection fraction was in the range of 60% to 65%. Left ventricular diastolic function parameters were normal. - Left atrium: The atrium was mildly to moderately dilated.   Assessment and Plan  1. PAF -has overall done well since ablation - 2 ER presentations with what looks like aflutter, episodes were 7 months apart - we will change her dilt to 300mg  daily, I think the increase to 360mg  is too much - EKG today shows she is back in SR at 58 - continue xarelto  - if recurrent issues with aflutter would have her f/u with EP. - her flutter  has been hard to rate control, if recurrent ER visit with symptoms would cardiovert if has not missed xarelto within 3 weeks.   2. HTN -she is at goal, continue current meds     F/u 3 months   Arnoldo Lenis, M.D.

## 2020-07-30 ENCOUNTER — Other Ambulatory Visit: Payer: Self-pay

## 2020-07-30 ENCOUNTER — Ambulatory Visit: Payer: No Typology Code available for payment source | Admitting: Internal Medicine

## 2020-07-30 ENCOUNTER — Encounter: Payer: Self-pay | Admitting: Internal Medicine

## 2020-07-30 ENCOUNTER — Ambulatory Visit: Payer: No Typology Code available for payment source | Admitting: Family Medicine

## 2020-07-30 DIAGNOSIS — R0609 Other forms of dyspnea: Secondary | ICD-10-CM | POA: Insufficient documentation

## 2020-07-30 DIAGNOSIS — R0600 Dyspnea, unspecified: Secondary | ICD-10-CM | POA: Diagnosis not present

## 2020-07-30 NOTE — Patient Instructions (Addendum)
To get the most out of exercise, you need to be continuously aware that you are short of breath, but never out of breath, for 30 minutes daily. As you improve, it will actually be easier for you to do the same amount of exercise  in  30 minutes so always push to the level where you are short of breath.   PACE YOURSELF   Make sure you check your oxygen saturation at your highest level of activity to be sure it stays over 90% and keep track of it at least once a week, more often if breathing getting worse, and let me know if losing ground.    Pantoprazole (protonix) 40 mg   Take  30-60 min before first meal of the day and Pepcid (famotidine)  20 mg one @  bedtime until return to office - this is the best way to tell whether stomach acid is contributing to your problem.    GERD (REFLUX)  is an extremely common cause of respiratory symptoms just like yours , many times with no obvious heartburn at all.    It can be treated with medication, but also with lifestyle changes including elevation of the head of your bed (ideally with 6 -8inch blocks under the headboard of your bed),  Smoking cessation, avoidance of late meals, excessive alcohol, and avoid fatty foods, chocolate, peppermint, colas, red wine, and acidic juices such as orange juice.  NO MINT OR MENTHOL PRODUCTS SO NO COUGH DROPS  USE SUGARLESS CANDY INSTEAD (Jolley ranchers or Stover's or Life Savers) or even ice chips will also do - the key is to swallow to prevent all throat clearing. NO OIL BASED VITAMINS - use powdered substitutes.  Avoid fish oil when coughing.   Please schedule a follow up office visit in 6 weeks, call sooner if needed

## 2020-07-30 NOTE — Assessment & Plan Note (Signed)
ERV 30% on PFTs  12/28/19 @ wt 255  Body mass index is 45.32 kg/m.  -    Lab Results  Component Value Date   TSH 4.207 12/16/2019     Contributing to gerd risk/ doe/reviewed the need and the process to achieve and maintain neg calorie balance > defer f/u primary care including intermittently monitoring thyroid status.  Each maintenance medication was reviewed in detail including emphasizing most importantly the difference between maintenance and prns and under what circumstances the prns are to be triggered using an action plan format where appropriate.  Total time for H and P, chart review, counseling,  directly observing portions of ambulatory 02 saturation study/ and generating customized AVS unique to this new pt office visit / same day charting = 56 min

## 2020-07-30 NOTE — Progress Notes (Signed)
Brooke Luna, female    DOB: January 24, 1946    MRN: [id number]   Brief patient profile:  74 yowf quit smoking 1998  @ 140 lb no resp problems until around 2019-2020 at around 230 lb  and gradually gained up to 264 with proportionate doe  >>>  self referred  to pulmonary clinic 07/30/2020   Onset of afib around 2018   Covid Pos  Aug 2021     History of Present Illness  07/30/2020  Pulmonary/ 1st office eval/ Melvyn Novas / [name]  Chief Complaint  Patient presents with  . Pulmonary Consult    Referred by Dr Monico Blitz. Pt c/o DOE for the past few years, esp worse over the past year. She gets winded walking room to room at home, taking out the trash, showering.   Dyspnea:  [employer] pushes buggy, uses HC, one aisle is the limit = MMRC3 = can't walk 100 yards even at a slow pace at a flat grade s stopping due to sob   Cough: for about a year /   Sporadic/dry  Sleep: wakes up most nights in 45 degrees  recliner due to breathing or coughng  X 2017  SABA use: not helping   No obvious day to day or daytime variability or assoc excess/ purulent sputum or mucus plugs or hemoptysis or cp or chest tightness, subjective wheeze or overt sinus or hb symptoms.   Sleeping as above without nocturnal  or early am exacerbation  of respiratory  c/o's or need for noct saba. Also denies any obvious fluctuation of symptoms with weather or environmental changes or other aggravating or alleviating factors except as outlined above   No unusual exposure hx or h/o childhood pna/ asthma or knowledge of premature birth.  Current Allergies, Complete Past Medical History, Past Surgical History, Family History, and Social History were reviewed in [employer] record.  ROS  The following are not active complaints unless bolded Hoarseness, sore throat, dysphagia, dental problems, itching, sneezing,  nasal congestion or discharge of excess mucus or purulent secretions, ear ache,   fever,  chills, sweats, unintended wt loss or wt gain, classically pleuritic or exertional cp,  orthopnea pnd or arm/hand swelling  or leg swelling, presyncope, palpitations, abdominal pain, anorexia, nausea, vomiting, diarrhea  or change in bowel habits or change in bladder habits, change in stools or change in urine, dysuria, hematuria,  rash, arthralgias, visual complaints, headache, numbness, weakness or ataxia or problems with walking or coordination,  change in mood or  memory.           Past Medical History:  Diagnosis Date  . Arthritis    pain and oa  right knee and pain in rt leg and foot--also pain left knee-but right is worse  . Atrial fibrillation ([place])   . Back pain    lumbar bulging disk  . Contact dermatitis    underside of left leg--always in the same spots--comes and goes  . Diverticulosis   . GERD (gastroesophageal reflux disease)    diet control  . Hemorrhoids   . Obesity   . Persistent atrial fibrillation [facility])     Outpatient Medications Prior to Visit  Medication Sig Dispense Refill  . albuterol (VENTOLIN HFA) 108 (90 Base) MCG/ACT inhaler Inhale 1-2 puffs into the lungs as needed.    . clonazePAM (KLONOPIN) 0.5 MG tablet Take 0.5 mg by mouth 2 (two) times daily as needed for anxiety.    Marland Kitchen diltiazem (CARDIZEM CD)  300 MG 24 hr capsule Take 1 capsule (300 mg total) by mouth daily. 90 capsule 2  . diltiazem (CARDIZEM) 30 MG tablet Take 1 tablet (30 mg total) by mouth every 8 (eight) hours as needed (palpitations).    . diphenhydrAMINE (BENADRYL) 25 MG tablet Take 25 mg by mouth daily as needed for allergies or sleep.     . diphenhydrAMINE-APAP, sleep, (EXCEDRIN PM) 38-500 MG TABS Take 1 tablet by mouth at bedtime as needed (sleep/pain).    . furosemide (LASIX) 20 MG tablet TAKE 1 TABLET BY MOUTH EVERY DAY 90 tablet 1  . Multiple Vitamin (MULTIVITAMIN WITH MINERALS) TABS tablet Take 1 tablet by mouth once a week.    . rivaroxaban (XARELTO) 20 MG TABS tablet Take 20 mg by mouth  daily with supper.    . citalopram (CELEXA) 20 MG tablet Take 20 mg by mouth at bedtime.     No facility-administered medications prior to visit.     Objective:     BP 124/68 (BP Location: Left Arm, Cuff Size: Normal)   Pulse 71   Temp (!) 97.5 F (36.4 C) (Temporal)   Ht 5\' 4"  (1.626 m)   Wt 264 lb (119.7 kg)   SpO2 94% Comment: on RA  BMI 45.32 kg/m   SpO2: 94 % (on RA)   Obese amb wf nad/ pseudowheeze resolves with plm    HEENT : pt wearing mask not removed for exam due to covid -19 concerns.    NECK :  without JVD/Nodes/TM/ nl carotid upstrokes bilaterally   LUNGS: no acc muscle use,  Nl contour chest which is clear to A and P bilaterally without cough on insp or exp maneuvers   CV:  RRR  no s3 or murmur or increase in P2, and no edema   ABD:  Quit obese soft and nontender withlimitd inspiratory excursion in the supine position. No bruits or organomegaly appreciated, bowel sounds nl  MS:  Nl gait/ ext warm without deformities, calf tenderness, cyanosis or clubbing No obvious joint restrictions   SKIN: warm and dry without lesions    NEURO:  alert, approp, nl sensorium with  no motor or cerebellar deficits apparent.     I personally reviewed images and agree with radiology impression as follows:  CXR:   07/17/20  1. No acute cardiopulmonary disease. 2. Chronic interstitial and bronchitic features.  Labs ordered/ reviewed:      Chemistry      Component Value Date/Time   NA 138 07/17/2020 0020   NA 142 06/08/2020 1401   K 3.5 07/17/2020 0020   CL 102 07/17/2020 0020   CO2 26 07/17/2020 0020   BUN 11 07/17/2020 0020   BUN 8 06/08/2020 1401   CREATININE 0.95 07/17/2020 0020      Component Value Date/Time   CALCIUM 8.6 (L) 07/17/2020 0020   ALKPHOS 75 10/23/2011 1410   AST 18 10/23/2011 1410   ALT 23 10/23/2011 1410   BILITOT 0.6 10/23/2011 1410        Lab Results  Component Value Date   WBC 13.8 (H) 07/17/2020   HGB 14.7 07/17/2020   HCT  46.2 (H) 07/17/2020   MCV 88.8 07/17/2020   PLT 449 (H) 07/17/2020     No results found for: DDIMER    Lab Results  Component Value Date   TSH 4.207 12/16/2019      BNP     07/17/20  = 141  Assessment   No problem-specific Assessment & Plan notes found for this encounter.     Christinia Gully, MD 07/30/2020

## 2020-07-30 NOTE — Assessment & Plan Note (Signed)
Onset was 2019-20 on a background of wt gain/ orthopnea since 2016-17 - Covid Pos aug 2021  - PFT's 12/28/19 nl ex ERV 30% with DLCO 14.17 (71%) and corrected to 5.01 (122%) for alv vol  -  07/30/2020   Walked RA  approx   300 ft  @ moderate pace  stopped due to  Sob with sats 93%     Symptoms are   disproportionate to objective findings and not clear to what extent this is actually a pulmonary  problem but pt does appear to have difficult to sort out respiratory symptoms of unknown origin for which  DDX  = almost all start with A and  include Adherence, Ace Inhibitors, Acid Reflux, Active Sinus Disease, Alpha 1 Antitripsin deficiency, Anxiety masquerading as Airways dz,  ABPA,  Allergy(esp in young), Aspiration (esp in elderly), Adverse effects of meds,  Active smoking or Vaping, A bunch of PE's/clot burden (a few small clots can't cause this syndrome unless there is already severe underlying pulm or vascular dz with poor reserve),  Anemia or thyroid disorder, plus two Bs  = Bronchiectasis and Beta blocker use..and one C= CHF   Adherence is always the initial "prime suspect" and is a multilayered concern that requires a "trust but verify" approach in every patient - starting with knowing how to use medications, especially inhalers, correctly, keeping up with refills and understanding the fundamental difference between maintenance and prns vs those medications only taken for a very short course and then stopped and not refilled.   ? Acid (or non-acid) GERD > always difficult to exclude as up to 75% of pts in some series report no assoc GI/ Heartburn symptoms but she does have unexplained cough > rec max (24h)  acid suppression and diet restrictions/ reviewed and instructions given in writing.   ? Allergy /asthma > no resp to saba/ no obst on pfts to suggest  ? Anemia/ thryoid dz > recently excluded   ? Anxiety/depression/ deconditioning assoc with wt gain  > usually at the bottom of this list of usual  suspects but note already on psychotropics and may interfere with adherence and also interpretation of response or lack thereof to symptom management which can be quite subjective.   ? Adverse drug effects > none of the usual suspects listed   ? A bunch of PEs > unlikely as already on DOAC  CHF > excluded by bnp

## 2020-09-12 ENCOUNTER — Ambulatory Visit: Payer: Medicare Other | Admitting: Internal Medicine

## 2020-09-24 ENCOUNTER — Other Ambulatory Visit: Payer: Self-pay | Admitting: Cardiology

## 2020-10-02 ENCOUNTER — Other Ambulatory Visit (HOSPITAL_COMMUNITY): Payer: Self-pay | Admitting: Cardiology

## 2020-10-25 ENCOUNTER — Encounter: Payer: Self-pay | Admitting: *Deleted

## 2020-10-25 ENCOUNTER — Ambulatory Visit: Payer: Medicare Other | Admitting: Cardiology

## 2020-10-25 ENCOUNTER — Encounter: Payer: Self-pay | Admitting: Cardiology

## 2020-10-25 VITALS — BP 134/70 | HR 69 | Ht 64.0 in | Wt 264.6 lb

## 2020-10-25 DIAGNOSIS — I4892 Unspecified atrial flutter: Secondary | ICD-10-CM | POA: Diagnosis not present

## 2020-10-25 DIAGNOSIS — I10 Essential (primary) hypertension: Secondary | ICD-10-CM | POA: Diagnosis not present

## 2020-10-25 DIAGNOSIS — I4891 Unspecified atrial fibrillation: Secondary | ICD-10-CM | POA: Diagnosis not present

## 2020-10-25 NOTE — Progress Notes (Signed)
Clinical Summary Brooke Luna is a 75 y.o.female seen today for follow up of the following medical problems.    1. PAF - diagnosed 12/2014 during hospital admission at [facility] - followed by Dr Rayann Heman, she is s/p ablation 05/2019   11/2019 aflutter admission - ER visit 07/17/20 with palpitations,EKG with aflutter with variable conduction - rates were able to be controlled in ER, discharged - at home HRs back up to 160 x anouther 24 hours - no bleeding on xarelto  - no recent symptoms.    2. HTN - compliantw ith meds     3. CAD - nonobstructive disease by cath 2006 according to prior clinic notes - admit 09/2016 to [facility] with chest pain primarily related to palpitations.    - no recent chest pain   - 05/2019 CT cardiac prior to ablation, no significant CAD - no recent chest pain     4. SOB - chronic stable SOB. - DOE walking in from parking lot. - remote tobacco history age 26 to age 68. - has upcoming appt with Dr Melvyn Novas. Prior PFTs showed some restrictive lung disease  - ongoing SOB/DOE unchanged.      5. Chronic LLE    Past Medical History:  Diagnosis Date   Arthritis    pain and oa  right knee and pain in rt leg and foot--also pain left knee-but right is worse   Atrial fibrillation ([facility])    Back pain    lumbar bulging disk   Contact dermatitis    underside of left leg--always in the same spots--comes and goes   Diverticulosis    GERD (gastroesophageal reflux disease)    diet control   Hemorrhoids    Obesity    Persistent atrial fibrillation ([facility])      Allergies  Allergen Reactions   Codeine Shortness Of Breath and Itching   Meloxicam Other (See Comments)    Stomach iritation      Current Outpatient Medications  Medication Sig Dispense Refill   albuterol (VENTOLIN HFA) 108 (90 Base) MCG/ACT inhaler Inhale 1-2 puffs into the lungs as needed.     citalopram (CELEXA) 20 MG tablet Take 20 mg by mouth at bedtime.     clonazePAM  (KLONOPIN) 0.5 MG tablet Take 0.5 mg by mouth 2 (two) times daily as needed for anxiety.     diltiazem (CARDIZEM CD) 300 MG 24 hr capsule Take 1 capsule (300 mg total) by mouth daily. 90 capsule 2   diltiazem (CARDIZEM) 30 MG tablet Take 1 tablet (30 mg total) by mouth every 8 (eight) hours as needed (palpitations).     diphenhydrAMINE (BENADRYL) 25 MG tablet Take 25 mg by mouth daily as needed for allergies or sleep.      diphenhydrAMINE-APAP, sleep, (EXCEDRIN PM) 38-500 MG TABS Take 1 tablet by mouth at bedtime as needed (sleep/pain).     furosemide (LASIX) 20 MG tablet TAKE 1 TABLET BY MOUTH EVERY DAY 90 tablet 1   Multiple Vitamin (MULTIVITAMIN WITH MINERALS) TABS tablet Take 1 tablet by mouth once a week.     rivaroxaban (XARELTO) 20 MG TABS tablet Take 20 mg by mouth daily with supper.     No current facility-administered medications for this visit.     Past Surgical History:  Procedure Laterality Date   ATRIAL FIBRILLATION ABLATION N/A 06/07/2019   Procedure: ATRIAL FIBRILLATION ABLATION;  Surgeon: Thompson Grayer, MD;  Location: [facility];  Service: Cardiovascular;  Laterality: N/A;  CARDIOVERSION N/A 05/09/2019   Procedure: CARDIOVERSION;  Surgeon: Buford Dresser, MD;  Location: [facility] ENDOSCOPY;  Service: Cardiovascular;  Laterality: N/A;   CATARACT EXTRACTION W/PHACO Left 02/22/2015   Procedure: CATARACT EXTRACTION PHACO AND INTRAOCULAR LENS PLACEMENT LEFT EYE CDE=5.41;  Surgeon: Tonny Leanah Kolander, MD;  Location: [facility];  Service: Ophthalmology;  Laterality: Left;   CATARACT EXTRACTION W/PHACO Right 03/26/2015   Procedure: CATARACT EXTRACTION PHACO AND INTRAOCULAR LENS PLACEMENT ([place]);  Surgeon: Tonny Jamien Casanova, MD;  Location: [facility];  Service: Ophthalmology;  Laterality: Right;  CDE:5.01   CHOLECYSTECTOMY     surgery for broken right elbow Right    TOTAL KNEE ARTHROPLASTY  11/03/2011   Procedure: TOTAL KNEE ARTHROPLASTY;  Surgeon: Gearlean Alf, MD;  Location: [facility];  Service:  Orthopedics;  Laterality: Right;   TUBAL LIGATION       Allergies  Allergen Reactions   Codeine Shortness Of Breath and Itching   Meloxicam Other (See Comments)    Stomach iritation       Family History  Problem Relation Age of Onset   Diabetes Mother    Stroke Mother    Stroke Father      Social History Brooke Luna reports that she quit smoking about 24 years ago. She started smoking about 63 years ago. She has a 38.00 pack-year smoking history. She has never used smokeless tobacco. Brooke Luna reports current alcohol use of about 1.0 standard drink of alcohol per week.   Review of Systems CONSTITUTIONAL: No weight loss, fever, chills, weakness or fatigue.  HEENT: Eyes: No visual loss, blurred vision, double vision or yellow sclerae.No hearing loss, sneezing, congestion, runny nose or sore throat.  SKIN: No rash or itching.  CARDIOVASCULAR: per hpi RESPIRATORY: per hpi GASTROINTESTINAL: No anorexia, nausea, vomiting or diarrhea. No abdominal pain or blood.  GENITOURINARY: No burning on urination, no polyuria NEUROLOGICAL: No headache, dizziness, syncope, paralysis, ataxia, numbness or tingling in the extremities. No change in bowel or bladder control.  MUSCULOSKELETAL: No muscle, back pain, joint pain or stiffness.  LYMPHATICS: No enlarged nodes. No history of splenectomy.  PSYCHIATRIC: No history of depression or anxiety.  ENDOCRINOLOGIC: No reports of sweating, cold or heat intolerance. No polyuria or polydipsia.  Marland Kitchen   Physical Examination Today's Vitals   10/25/20 1437  BP: 134/70  Pulse: 69  SpO2: 94%  Weight: 264 lb 9.6 oz (120 kg)  Height: 5\' 4"  (1.626 m)   Body mass index is 45.42 kg/m.  Gen: resting comfortably, no acute distress HEENT: no scleral icterus, pupils equal round and reactive, no palptable cervical adenopathy,  CV: RRR, no m/r/g, no jvd Resp: Clear to auscultation bilaterally GI: abdomen is soft, non-tender, non-distended, normal bowel  sounds, no hepatosplenomegaly MSK: extremities are warm, no edema.  Skin: warm, no rash Neuro:  no focal deficits Psych: appropriate affect   Diagnostic Studies 12/2016 sleep study IMPRESSIONS - Minimal obstructive sleep apnea occurred during this study (AHI = 5.5/h) occurring primarily during supine sleep. - Mild central sleep apnea occurred during this study (CAI = 5.5/h). - The patient had minimal or no oxygen desaturation during the study (Min O2 = 100.00%) - The patient snored with Loud snoring volume. - EKG findings include Atrial Fibrillation. - Mild periodic limb movements of sleep occurred during the study. Associated arousals were significant.   DIAGNOSIS - Obstructive Sleep Apnea (327.23 [G47.33 ICD-10])   RECOMMENDATIONS - Very mild obstructive sleep apnea occurring primarily in supine position. - Recommend avoiding sleeping supine. - Avoid alcohol,  sedatives and other CNS depressants that may worsen sleep apnea and disrupt normal sleep architecture. - Sleep hygiene should be reviewed to assess factors that may improve sleep quality. - Weight management and regular exercise should be initiated or continued if appropriate. - Consider referral to ENT for evaluation of possible surgical causes of snoring.    11/2016 echo Study Conclusions   - Left ventricle: The cavity size was normal. Wall thickness was   normal. Systolic function was normal. The estimated ejection   fraction was in the range of 60% to 65%. Left ventricular   diastolic function parameters were normal. - Left atrium: The atrium was mildly to moderately dilated.    Assessment and Plan  1. PAF/Aflutter - has overall done well since ablation - 2 ER presentations with what looks like aflutter, episodes were 7 months apart. No recent symptoms. Continue current meds - if recurrent issues with aflutter would have her f/u with EP. - her flutter has been hard to rate control, if recurrent ER visit with  symptoms would cardiovert if has not missed xarelto within 3 weeks.  - EKG today shows NSR   2. HTN - at goal, continue current meds      Arnoldo Lenis, M.D.,

## 2020-10-25 NOTE — Patient Instructions (Signed)
Medication Instructions:  Continue all current medications.   Labwork: none  Testing/Procedures: none  Follow-Up: 6 months   Any Other Special Instructions Will Be Listed Below (If Applicable).   If you need a refill on your cardiac medications before your next appointment, please call your pharmacy.  

## 2020-12-06 ENCOUNTER — Ambulatory Visit (HOSPITAL_COMMUNITY): Payer: No Typology Code available for payment source | Admitting: Nurse Practitioner

## 2021-02-15 ENCOUNTER — Ambulatory Visit: Payer: Medicare Other | Admitting: Cardiology

## 2021-02-15 ENCOUNTER — Encounter: Payer: Self-pay | Admitting: Cardiology

## 2021-02-15 ENCOUNTER — Other Ambulatory Visit: Payer: Self-pay

## 2021-02-15 VITALS — BP 120/76 | HR 115 | Ht 64.0 in | Wt 268.0 lb

## 2021-02-15 DIAGNOSIS — I480 Paroxysmal atrial fibrillation: Secondary | ICD-10-CM

## 2021-02-15 MED ORDER — METOPROLOL TARTRATE 37.5 MG PO TABS: 37.5000 mg | ORAL_TABLET | Freq: Two times a day (BID) | ORAL | 6 refills | Status: DC

## 2021-02-15 NOTE — Patient Instructions (Addendum)
Medication Instructions:  Increase Lopressor to 37.5mg  twice a day Continue all other medications.     Labwork: none  Testing/Procedures: none  Follow-Up: 3 months   Any Other Special Instructions Will Be Listed Below (If Applicable). Follow up with Dr. Rayann Heman in the next 3-4 weeks.  Nurse visit next Tuesday for vitals & ekg.    If you need a refill on your cardiac medications before your next appointment, please call your pharmacy.

## 2021-02-15 NOTE — Progress Notes (Signed)
Clinical Summary Ms. Fronek is a 75 y.o.femaleseen today for follow up of the following medical problems. This is a focused visit for recent admission with afib with RVR   1. PAF/Aflutter - diagnosed 12/2014 during hospital admission at [facility] - followed by Dr Rayann Heman, she is s/p afib ablation 05/2019   - 11/2019 had admission with atrial flutter. Incidetnal COVID + at the time. She was rate controlled, at follow ups had converted back to SR - ER visit 07/17/20 with palpitations,EKG with aflutter with variable conduction.  - rates were able to be controlled in ER, discharged  - admit to [facility] 01/2021 initially for unintentional overdose of her calcium channel blocker - noted to be in afib with RVR at the time. Found to be RSV+, had a cough at the time.   - symptoms started on Monday.  - missed dose of xarelto on Tuesday Oct 17  - was continued on dilt 300mg , lopressor 25mg  bid was added - since discharge feels fine at rest, has palpitations with activity   Past Medical History:  Diagnosis Date   Arthritis    pain and oa  right knee and pain in rt leg and foot--also pain left knee-but right is worse   Atrial fibrillation (HCC)    Back pain    lumbar bulging disk   Contact dermatitis    underside of left leg--always in the same spots--comes and goes   Diverticulosis    GERD (gastroesophageal reflux disease)    diet control   Hemorrhoids    Obesity    Persistent atrial fibrillation (HCC)      Allergies  Allergen Reactions   Codeine Shortness Of Breath and Itching   Meloxicam Other (See Comments)    Stomach iritation      Current Outpatient Medications  Medication Sig Dispense Refill   albuterol (VENTOLIN HFA) 108 (90 Base) MCG/ACT inhaler Inhale 1-2 puffs into the lungs as needed.     citalopram (CELEXA) 20 MG tablet Take 20 mg by mouth at bedtime.     clonazePAM (KLONOPIN) 0.5 MG tablet Take 0.5 mg by mouth 2 (two) times daily as needed for anxiety.      diltiazem (CARDIZEM CD) 300 MG 24 hr capsule Take 1 capsule (300 mg total) by mouth daily. 90 capsule 2   diltiazem (CARDIZEM) 30 MG tablet Take 1 tablet (30 mg total) by mouth every 8 (eight) hours as needed (palpitations).     diphenhydrAMINE (BENADRYL) 25 MG tablet Take 25 mg by mouth daily as needed for allergies or sleep.      diphenhydrAMINE-APAP, sleep, (EXCEDRIN PM) 38-500 MG TABS Take 1 tablet by mouth at bedtime as needed (sleep/pain).     furosemide (LASIX) 20 MG tablet TAKE 1 TABLET BY MOUTH EVERY DAY 90 tablet 1   Multiple Vitamin (MULTIVITAMIN WITH MINERALS) TABS tablet Take 1 tablet by mouth once a week.     rivaroxaban (XARELTO) 20 MG TABS tablet Take 20 mg by mouth daily with supper.     No current facility-administered medications for this visit.     Past Surgical History:  Procedure Laterality Date   ATRIAL FIBRILLATION ABLATION N/A 06/07/2019   Procedure: ATRIAL FIBRILLATION ABLATION;  Surgeon: Thompson Grayer, MD;  Location: [place] [facility];  Service: Cardiovascular;  Laterality: N/A;   CARDIOVERSION N/A 05/09/2019   Procedure: CARDIOVERSION;  Surgeon: Buford Dresser, MD;  Location: [place];  Service: Cardiovascular;  Laterality: N/A;   CATARACT EXTRACTION W/PHACO Left  02/22/2015   Procedure: CATARACT EXTRACTION PHACO AND INTRAOCULAR LENS PLACEMENT LEFT EYE CDE=5.41;  Surgeon: Tonny Kerry-Anne Mezo, MD;  Location: [facility];  Service: Ophthalmology;  Laterality: Left;   CATARACT EXTRACTION W/PHACO Right 03/26/2015   Procedure: CATARACT EXTRACTION PHACO AND INTRAOCULAR LENS PLACEMENT ([place]);  Surgeon: Tonny Rylen Hou, MD;  Location: [facility];  Service: Ophthalmology;  Laterality: Right;  CDE:5.01   CHOLECYSTECTOMY     surgery for broken right elbow Right    TOTAL KNEE ARTHROPLASTY  11/03/2011   Procedure: TOTAL KNEE ARTHROPLASTY;  Surgeon: Gearlean Alf, MD;  Location: [facility];  Service: Orthopedics;  Laterality: Right;   TUBAL LIGATION       Allergies  Allergen Reactions    Codeine Shortness Of Breath and Itching   Meloxicam Other (See Comments)    Stomach iritation       Family History  Problem Relation Age of Onset   Diabetes Mother    Stroke Mother    Stroke Father      Social History Ms. Giuliano reports that she quit smoking about 24 years ago. Her smoking use included cigarettes. She started smoking about 63 years ago. She has a 38.00 pack-year smoking history. She has never used smokeless tobacco. Ms. Burgner reports current alcohol use of about 1.0 standard drink per week.   Review of Systems CONSTITUTIONAL: No weight loss, fever, chills, weakness or fatigue.  HEENT: Eyes: No visual loss, blurred vision, double vision or yellow sclerae.No hearing loss, sneezing, congestion, runny nose or sore throat.  SKIN: No rash or itching.  CARDIOVASCULAR: per hpi RESPIRATORY: No shortness of breath, cough or sputum.  GASTROINTESTINAL: No anorexia, nausea, vomiting or diarrhea. No abdominal pain or blood.  GENITOURINARY: No burning on urination, no polyuria NEUROLOGICAL: No headache, dizziness, syncope, paralysis, ataxia, numbness or tingling in the extremities. No change in bowel or bladder control.  MUSCULOSKELETAL: No muscle, back pain, joint pain or stiffness.  LYMPHATICS: No enlarged nodes. No history of splenectomy.  PSYCHIATRIC: No history of depression or anxiety.  ENDOCRINOLOGIC: No reports of sweating, cold or heat intolerance. No polyuria or polydipsia.  Marland Kitchen   Physical Examination Today's Vitals   02/15/21 0824  BP: 120/76  Pulse: (!) 115  SpO2: 95%  Weight: 268 lb (121.6 kg)  Height: 5\' 4"  (1.626 m)   Body mass index is 46 kg/m.  Gen: resting comfortably, no acute distress HEENT: no scleral icterus, pupils equal round and reactive, no palptable cervical adenopathy,  CV: irreg, no m/r/g no jvd Resp: Clear to auscultation bilaterally GI: abdomen is soft, non-tender, non-distended, normal bowel sounds, no hepatosplenomegaly MSK:  extremities are warm, no edema.  Skin: warm, no rash Neuro:  no focal deficits Psych: appropriate affect   Diagnostic Studies 12/2016 sleep study IMPRESSIONS - Minimal obstructive sleep apnea occurred during this study (AHI = 5.5/h) occurring primarily during supine sleep. - Mild central sleep apnea occurred during this study (CAI = 5.5/h). - The patient had minimal or no oxygen desaturation during the study (Min O2 = 100.00%) - The patient snored with Loud snoring volume. - EKG findings include Atrial Fibrillation. - Mild periodic limb movements of sleep occurred during the study. Associated arousals were significant.   DIAGNOSIS - Obstructive Sleep Apnea (327.23 [G47.33 ICD-10])   RECOMMENDATIONS - Very mild obstructive sleep apnea occurring primarily in supine position. - Recommend avoiding sleeping supine. - Avoid alcohol, sedatives and other CNS depressants that may worsen sleep apnea and disrupt normal sleep architecture. - Sleep hygiene should be  reviewed to assess factors that may improve sleep quality. - Weight management and regular exercise should be initiated or continued if appropriate. - Consider referral to ENT for evaluation of possible surgical causes of snoring.    11/2016 echo Study Conclusions   - Left ventricle: The cavity size was normal. Wall thickness was   normal. Systolic function was normal. The estimated ejection   fraction was in the range of 60% to 65%. Left ventricular   diastolic function parameters were normal. - Left atrium: The atrium was mildly to moderately dilated.    Assessment and Plan  1. PAF/Aflutter - prior ablation - recent admission with afib with RVR, she was RSV+ - on dilt 300mg , lopressor 25mg  bid. Rates remain elevated, increase lopressor to 37.5mg  bid - come back Tuesday for nursing visit for vitals and EKG, may further titrate lopressor. - she missed a dose of xarelto of Oct 17, if consider DCCV would need TEE prior - have  her f/u with EP to reassess long term afib management options.       Arnoldo Lenis, M.D

## 2021-02-19 ENCOUNTER — Ambulatory Visit (INDEPENDENT_AMBULATORY_CARE_PROVIDER_SITE_OTHER): Payer: Medicare Other | Admitting: *Deleted

## 2021-02-19 VITALS — BP 118/68 | HR 58

## 2021-02-19 DIAGNOSIS — I480 Paroxysmal atrial fibrillation: Secondary | ICD-10-CM | POA: Diagnosis not present

## 2021-02-19 NOTE — Progress Notes (Signed)
Patient in office for EKG & vitals.    See ekg scanned into epic.    Recent increase on her Lopressor to 37.5mg  twice a day.   Seeing Dr. Rayann Heman on 03/01/2021

## 2021-02-26 ENCOUNTER — Telehealth: Payer: Self-pay | Admitting: *Deleted

## 2021-02-26 NOTE — Progress Notes (Signed)
Please see test results section.

## 2021-02-26 NOTE — Telephone Encounter (Signed)
-----   Message from Arnoldo Lenis, MD sent at 02/25/2021 10:19 AM EDT ----- EKG shows back in normal rhythm, would continue current meds   Zandra Abts MD

## 2021-02-26 NOTE — Telephone Encounter (Signed)
Laurine Blazer, LPN  50/08/[id number]  7:34 PM EDT Back to Top    Notified, copy to pcp.

## 2021-03-01 ENCOUNTER — Ambulatory Visit: Payer: Medicare Other | Admitting: Internal Medicine

## 2021-03-01 VITALS — BP 130/74 | HR 53 | Ht 64.0 in | Wt 267.0 lb

## 2021-03-01 DIAGNOSIS — I480 Paroxysmal atrial fibrillation: Secondary | ICD-10-CM

## 2021-03-01 DIAGNOSIS — D6869 Other thrombophilia: Secondary | ICD-10-CM | POA: Diagnosis not present

## 2021-03-01 DIAGNOSIS — R0602 Shortness of breath: Secondary | ICD-10-CM

## 2021-03-01 MED ORDER — METOPROLOL TARTRATE 25 MG PO TABS: 25.0000 mg | ORAL_TABLET | Freq: Two times a day (BID) | ORAL | 6 refills | Status: DC

## 2021-03-01 NOTE — Patient Instructions (Signed)
Medication Instructions:  Decrease Metoprolol to 25mg  twice a day   Continue all other medications.     Labwork: none  Testing/Procedures: none  Follow-Up: Afib clinic - 3 months   Any Other Special Instructions Will Be Listed Below (If Applicable).   If you need a refill on your cardiac medications before your next appointment, please call your pharmacy.

## 2021-03-01 NOTE — Progress Notes (Signed)
PCP: [name], MD Primary Cardiologist: Dr Harl Bowie Primary EP: Dr Regenia Skeeter Brooke Luna is a 75 y.o. female who presents today for routine electrophysiology followup.  Since last being seen in our clinic, the patient reports doing reasonably well.  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  Her afib terminated without cardioversion.  Unfortunately, she overdosed on diltiazem (accidentally) and had to go to [facility] ED.  She has done well since, without issues.   She is not very active.  + SOB with moderate activity.  Today, she denies symptoms of palpitations, chest pain,  lower extremity edema, dizziness, presyncope, or syncope.  The patient is otherwise without complaint today.   Past Medical History:  Diagnosis Date   Arthritis    pain and oa  right knee and pain in rt leg and foot--also pain left knee-but right is worse   Atrial fibrillation (HCC)    Back pain    lumbar bulging disk   Contact dermatitis    underside of left leg--always in the same spots--comes and goes   Diverticulosis    GERD (gastroesophageal reflux disease)    diet control   Hemorrhoids    Obesity    Persistent atrial fibrillation ([place])    Past Surgical History:  Procedure Laterality Date   ATRIAL FIBRILLATION ABLATION N/A 06/07/2019   Procedure: ATRIAL FIBRILLATION ABLATION;  Surgeon: Thompson Grayer, MD;  Location: [place] [facility];  Service: Cardiovascular;  Laterality: N/A;   CARDIOVERSION N/A 05/09/2019   Procedure: CARDIOVERSION;  Surgeon: Buford Dresser, MD;  Location: [facility];  Service: Cardiovascular;  Laterality: N/A;   CATARACT EXTRACTION W/PHACO Left 02/22/2015   Procedure: CATARACT EXTRACTION PHACO AND INTRAOCULAR LENS PLACEMENT LEFT EYE CDE=5.41;  Surgeon: Tonny Branch, MD;  Location: [facility];  Service: Ophthalmology;  Laterality: Left;   CATARACT EXTRACTION W/PHACO Right 03/26/2015   Procedure: CATARACT EXTRACTION PHACO AND INTRAOCULAR LENS  PLACEMENT ([place]);  Surgeon: Tonny Branch, MD;  Location: [facility];  Service: Ophthalmology;  Laterality: Right;  CDE:5.01   CHOLECYSTECTOMY     surgery for broken right elbow Right    TOTAL KNEE ARTHROPLASTY  11/03/2011   Procedure: TOTAL KNEE ARTHROPLASTY;  Surgeon: Gearlean Alf, MD;  Location: [facility];  Service: Orthopedics;  Laterality: Right;   TUBAL LIGATION      ROS- all systems are reviewed and negatives except as per HPI above  Current Outpatient Medications  Medication Sig Dispense Refill   albuterol (VENTOLIN HFA) 108 (90 Base) MCG/ACT inhaler Inhale 1-2 puffs into the lungs as needed.     amoxicillin-clavulanate (AUGMENTIN) 500-125 MG tablet Take 1 tablet by mouth 2 (two) times daily.     citalopram (CELEXA) 20 MG tablet Take 20 mg by mouth at bedtime.     clonazePAM (KLONOPIN) 0.5 MG tablet Take 0.5 mg by mouth 2 (two) times daily as needed for anxiety.     diltiazem (CARDIZEM CD) 300 MG 24 hr capsule Take 1 capsule (300 mg total) by mouth daily. 90 capsule 2   diltiazem (CARDIZEM) 30 MG tablet Take 1 tablet (30 mg total) by mouth every 8 (eight) hours as needed (palpitations).     diphenhydrAMINE (BENADRYL) 25 MG tablet Take 25 mg by mouth daily as needed for allergies or sleep.      diphenhydrAMINE-APAP, sleep, (EXCEDRIN PM) 38-500 MG TABS Take 1 tablet by mouth at bedtime as needed (sleep/pain).     furosemide (LASIX) 20 MG tablet TAKE  1 TABLET BY MOUTH EVERY DAY 90 tablet 1   metoprolol tartrate 37.5 MG TABS Take 37.5 mg by mouth 2 (two) times daily. 60 tablet 6   Multiple Vitamin (MULTIVITAMIN WITH MINERALS) TABS tablet Take 1 tablet by mouth once a week.     rivaroxaban (XARELTO) 20 MG TABS tablet Take 20 mg by mouth daily with supper.     No current facility-administered medications for this visit.    Physical Exam: Vitals:   03/01/21 1339  BP: 130/74  Pulse: (!) 53  SpO2: (!) 53%  Weight: 267 lb (121.1 kg)  Height: 5\' 4"  (1.626 m)    GEN- The patient is well  appearing, alert and oriented x 3 today.   Head- normocephalic, atraumatic Eyes-  Sclera clear, conjunctiva pink Ears- hearing intact Oropharynx- clear Lungs- Clear to ausculation bilaterally, normal work of breathing Heart- Regular rate and rhythm, no murmurs, rubs or gallops, PMI not laterally displaced GI- soft, NT, ND, + BS Extremities- no clubbing, cyanosis, or edema  Wt Readings from Last 3 Encounters:  03/01/21 267 lb (121.1 kg)  02/15/21 268 lb (121.6 kg)  10/25/20 264 lb 9.6 oz (120 kg)    EKG tracing from 02/15/21 reveals rate controlled afib,  EKG from 02/19/21 reveals sinus bradycardia  Assessment and Plan:  Paroxysmal atrial fibrillation Doing well post ablation off AAD therapy Chads2vasc score is 4.  She is on xarelto She recently had AF recurrence in the setting of RSV.  I would not advise any changes at this time to AADs.  Reduce metoprolol to 25mg  BID.  Consider stopping metoprolol if no AF on return I have advised lifestyle modification.   She will almost certainly have additional afib without substantial weight loss and exercise  2. HTN Stable No change required today  3. Obesity Body mass index is 45.83 kg/m. Lifestyle modification advised  4. SOB Likely due to deconditioning Echo reviewed We discussed importance of regular exercise at length today  Follow-up in AF clinic in 3 months  Thompson Grayer MD, [facility] 03/01/2021 1:56 PM

## 2021-04-22 ENCOUNTER — Encounter (HOSPITAL_COMMUNITY): Payer: Self-pay | Admitting: *Deleted

## 2021-04-22 ENCOUNTER — Inpatient Hospital Stay (HOSPITAL_COMMUNITY)
Admission: EM | Admit: 2021-04-22 | Discharge: 2021-04-26 | DRG: 291 | Disposition: A | Discharge status: Home Health | Payer: Medicare Other | Attending: Internal Medicine | Admitting: Internal Medicine

## 2021-04-22 ENCOUNTER — Other Ambulatory Visit: Payer: Self-pay

## 2021-04-22 ENCOUNTER — Emergency Department (HOSPITAL_COMMUNITY): Payer: Medicare Other

## 2021-04-22 DIAGNOSIS — I5031 Acute diastolic (congestive) heart failure: Secondary | ICD-10-CM

## 2021-04-22 DIAGNOSIS — J9601 Acute respiratory failure with hypoxia: Secondary | ICD-10-CM | POA: Diagnosis present

## 2021-04-22 DIAGNOSIS — R0902 Hypoxemia: Secondary | ICD-10-CM | POA: Diagnosis not present

## 2021-04-22 DIAGNOSIS — I5033 Acute on chronic diastolic (congestive) heart failure: Secondary | ICD-10-CM | POA: Diagnosis present

## 2021-04-22 DIAGNOSIS — J449 Chronic obstructive pulmonary disease, unspecified: Secondary | ICD-10-CM | POA: Diagnosis present

## 2021-04-22 DIAGNOSIS — K219 Gastro-esophageal reflux disease without esophagitis: Secondary | ICD-10-CM | POA: Diagnosis present

## 2021-04-22 DIAGNOSIS — Z87891 Personal history of nicotine dependence: Secondary | ICD-10-CM

## 2021-04-22 DIAGNOSIS — I4819 Other persistent atrial fibrillation: Secondary | ICD-10-CM | POA: Diagnosis present

## 2021-04-22 DIAGNOSIS — Z6841 Body Mass Index (BMI) 40.0 and over, adult: Secondary | ICD-10-CM | POA: Diagnosis not present

## 2021-04-22 DIAGNOSIS — Z833 Family history of diabetes mellitus: Secondary | ICD-10-CM

## 2021-04-22 DIAGNOSIS — Z7901 Long term (current) use of anticoagulants: Secondary | ICD-10-CM

## 2021-04-22 DIAGNOSIS — I509 Heart failure, unspecified: Secondary | ICD-10-CM | POA: Diagnosis not present

## 2021-04-22 DIAGNOSIS — Z79899 Other long term (current) drug therapy: Secondary | ICD-10-CM | POA: Diagnosis not present

## 2021-04-22 DIAGNOSIS — F32A Depression, unspecified: Secondary | ICD-10-CM | POA: Diagnosis present

## 2021-04-22 DIAGNOSIS — Z96651 Presence of right artificial knee joint: Secondary | ICD-10-CM | POA: Diagnosis present

## 2021-04-22 DIAGNOSIS — Z20822 Contact with and (suspected) exposure to covid-19: Secondary | ICD-10-CM | POA: Diagnosis present

## 2021-04-22 DIAGNOSIS — F411 Generalized anxiety disorder: Secondary | ICD-10-CM | POA: Diagnosis present

## 2021-04-22 DIAGNOSIS — F410 Panic disorder [episodic paroxysmal anxiety] without agoraphobia: Secondary | ICD-10-CM | POA: Diagnosis present

## 2021-04-22 DIAGNOSIS — I4891 Unspecified atrial fibrillation: Secondary | ICD-10-CM | POA: Diagnosis present

## 2021-04-22 DIAGNOSIS — E876 Hypokalemia: Secondary | ICD-10-CM | POA: Diagnosis present

## 2021-04-22 DIAGNOSIS — I110 Hypertensive heart disease with heart failure: Principal | ICD-10-CM | POA: Diagnosis present

## 2021-04-22 DIAGNOSIS — Z7951 Long term (current) use of inhaled steroids: Secondary | ICD-10-CM

## 2021-04-22 DIAGNOSIS — E662 Morbid (severe) obesity with alveolar hypoventilation: Secondary | ICD-10-CM | POA: Diagnosis present

## 2021-04-22 DIAGNOSIS — Z823 Family history of stroke: Secondary | ICD-10-CM

## 2021-04-22 LAB — PHOSPHORUS: Phosphorus: 3.4 mg/dL (ref 2.5–4.6)

## 2021-04-22 LAB — COMPREHENSIVE METABOLIC PANEL
ALT: 18 U/L (ref 0–44)
AST: 21 U/L (ref 15–41)
Albumin: 4 g/dL (ref 3.5–5.0)
Alkaline Phosphatase: 73 U/L (ref 38–126)
Anion gap: 11 (ref 5–15)
BUN: 9 mg/dL (ref 8–23)
CO2: 24 mmol/L (ref 22–32)
Calcium: 8.7 mg/dL — ABNORMAL LOW (ref 8.9–10.3)
Chloride: 99 mmol/L (ref 98–111)
Creatinine, Ser: 0.73 mg/dL (ref 0.44–1.00)
GFR, Estimated: >60 mL/min (ref >60)
Glucose, Bld: 135 mg/dL — ABNORMAL HIGH (ref 70–99)
Potassium: 4 mmol/L (ref 3.5–5.1)
Sodium: 134 mmol/L — ABNORMAL LOW (ref 135–145)
Total Bilirubin: 2.3 mg/dL — ABNORMAL HIGH (ref 0.3–1.2)
Total Protein: 7.3 g/dL (ref 6.5–8.1)

## 2021-04-22 LAB — CBC WITH DIFFERENTIAL/PLATELET
Abs Immature Granulocytes: 0.07 10*3/uL (ref 0.00–0.07)
Basophils Absolute: 0.1 10*3/uL (ref 0.0–0.1)
Basophils Relative: 0 %
Eosinophils Absolute: 0.1 10*3/uL (ref 0.0–0.5)
Eosinophils Relative: 1 %
HCT: 42 % (ref 36.0–46.0)
Hemoglobin: 13.4 g/dL (ref 12.0–15.0)
Immature Granulocytes: 1 %
Lymphocytes Relative: 6 %
Lymphs Abs: 0.8 10*3/uL (ref 0.7–4.0)
MCH: 29 pg (ref 26.0–34.0)
MCHC: 31.9 g/dL (ref 30.0–36.0)
MCV: 90.9 fL (ref 80.0–100.0)
Monocytes Absolute: 1 10*3/uL (ref 0.1–1.0)
Monocytes Relative: 7 %
Neutro Abs: 11.9 10*3/uL — ABNORMAL HIGH (ref 1.7–7.7)
Neutrophils Relative %: 85 %
Platelets: 368 10*3/uL (ref 150–400)
RBC: 4.62 MIL/uL (ref 3.87–5.11)
RDW: 14.2 % (ref 11.5–15.5)
WBC: 14 10*3/uL — ABNORMAL HIGH (ref 4.0–10.5)
nRBC: 0 % (ref 0.0–0.2)

## 2021-04-22 LAB — RESP PANEL BY RT-PCR (FLU A&B, COVID) ARPGX2
Influenza A by PCR: NEGATIVE
Influenza B by PCR: NEGATIVE
SARS Coronavirus 2 by RT PCR: NEGATIVE

## 2021-04-22 LAB — D-DIMER, QUANTITATIVE: D-Dimer, Quant: <0.27 ug/mL-FEU (ref 0.00–0.50)

## 2021-04-22 LAB — TSH: TSH: 5.085 u[IU]/mL — ABNORMAL HIGH (ref 0.350–4.500)

## 2021-04-22 LAB — BRAIN NATRIURETIC PEPTIDE: B Natriuretic Peptide: 217 pg/mL — ABNORMAL HIGH (ref 0.0–100.0)

## 2021-04-22 LAB — MAGNESIUM: Magnesium: 2.1 mg/dL (ref 1.7–2.4)

## 2021-04-22 MED ORDER — ACETAMINOPHEN 650 MG RE SUPP: 650.0000 mg | Freq: Four times a day (QID) | RECTAL | Status: DC | PRN

## 2021-04-22 MED ORDER — ONDANSETRON HCL 4 MG/2ML IJ SOLN: 4.0000 mg | Freq: Four times a day (QID) | INTRAMUSCULAR | Status: DC | PRN

## 2021-04-22 MED ORDER — METOLAZONE 5 MG PO TABS
2.5000 mg | ORAL_TABLET | Freq: Every day | ORAL | Status: AC
  Administered 2021-04-22 – 2021-04-24 (×3): 2.5 mg via ORAL
  Filled 2021-04-22 (×3): qty 1

## 2021-04-22 MED ORDER — SODIUM CHLORIDE 0.9 % IV SOLN: 250.0000 mL | INTRAVENOUS | Status: DC | PRN

## 2021-04-22 MED ORDER — ACETAMINOPHEN 325 MG PO TABS
650.0000 mg | ORAL_TABLET | Freq: Four times a day (QID) | ORAL | Status: DC | PRN
  Administered 2021-04-22 – 2021-04-24 (×2): 650 mg via ORAL
  Filled 2021-04-22 (×2): qty 2

## 2021-04-22 MED ORDER — CITALOPRAM HYDROBROMIDE 20 MG PO TABS
20.0000 mg | ORAL_TABLET | Freq: Every day | ORAL | Status: DC
  Administered 2021-04-23 – 2021-04-26 (×4): 20 mg via ORAL
  Filled 2021-04-22 (×4): qty 1

## 2021-04-22 MED ORDER — FUROSEMIDE 10 MG/ML IJ SOLN: 40.0000 mg | Freq: Two times a day (BID) | INTRAMUSCULAR | Status: DC

## 2021-04-22 MED ORDER — FUROSEMIDE 10 MG/ML IJ SOLN
40.0000 mg | Freq: Two times a day (BID) | INTRAMUSCULAR | Status: DC
  Administered 2021-04-22 – 2021-04-26 (×8): 40 mg via INTRAVENOUS
  Filled 2021-04-22 (×8): qty 4

## 2021-04-22 MED ORDER — RIVAROXABAN 20 MG PO TABS
20.0000 mg | ORAL_TABLET | Freq: Every day | ORAL | Status: DC
  Administered 2021-04-23 – 2021-04-26 (×4): 20 mg via ORAL
  Filled 2021-04-22 (×4): qty 1

## 2021-04-22 MED ORDER — METOPROLOL TARTRATE 25 MG PO TABS
25.0000 mg | ORAL_TABLET | Freq: Two times a day (BID) | ORAL | Status: DC
  Administered 2021-04-22 – 2021-04-26 (×8): 25 mg via ORAL
  Filled 2021-04-22 (×8): qty 1

## 2021-04-22 MED ORDER — CLONAZEPAM 0.5 MG PO TABS
0.5000 mg | ORAL_TABLET | Freq: Two times a day (BID) | ORAL | Status: DC | PRN
  Administered 2021-04-22 – 2021-04-23 (×2): 0.5 mg via ORAL
  Filled 2021-04-22 (×2): qty 1

## 2021-04-22 MED ORDER — PANTOPRAZOLE SODIUM 40 MG PO TBEC
40.0000 mg | DELAYED_RELEASE_TABLET | Freq: Every day | ORAL | Status: DC
  Administered 2021-04-22 – 2021-04-26 (×5): 40 mg via ORAL
  Filled 2021-04-22 (×5): qty 1

## 2021-04-22 MED ORDER — FUROSEMIDE 10 MG/ML IJ SOLN: 40.0000 mg | Freq: Once | INTRAMUSCULAR | Status: DC

## 2021-04-22 MED ORDER — SODIUM CHLORIDE 0.9% FLUSH
3.0000 mL | Freq: Two times a day (BID) | INTRAVENOUS | Status: DC
  Administered 2021-04-22 – 2021-04-26 (×8): 3 mL via INTRAVENOUS

## 2021-04-22 MED ORDER — FLUTICASONE FUROATE-VILANTEROL 200-25 MCG/ACT IN AEPB
1.0000 | INHALATION_SPRAY | Freq: Every day | RESPIRATORY_TRACT | Status: DC
  Filled 2021-04-22: qty 28

## 2021-04-22 MED ORDER — SODIUM CHLORIDE 0.9% FLUSH: 3.0000 mL | INTRAVENOUS | Status: DC | PRN

## 2021-04-22 MED ORDER — ONDANSETRON HCL 4 MG PO TABS: 4.0000 mg | ORAL_TABLET | Freq: Four times a day (QID) | ORAL | Status: DC | PRN

## 2021-04-22 NOTE — ED Triage Notes (Signed)
Pt c/o SOB and low oxygen readings while walking. Pt saw the lung doctor 6 months ago and was told she couldn't breathe because she was "fat". Pt's O2 sat 71% on RA this morning while walking.

## 2021-04-22 NOTE — ED Provider Notes (Signed)
[facility] EMERGENCY DEPARTMENT Provider Note   CSN: [id number] Arrival date & time: 04/22/21  1022     History Chief Complaint  Patient presents with   Shortness of Breath    Brooke Luna is a 75 y.o. female.  HPI She presents for evaluation of hypoxia, measured at home, apparently a recurrent problem.  She has previously had low oxygenation, was evaluated and told that she was having trouble oxygenating due to obesity.  Prior evaluation by pulmonology, April 2022, at that time she was having shortness of breath with dyspnea on exertion.  She was evaluated and was found to have nonspecific etiology for her symptoms.  She was prescribed a PPI.  She was advised on gentle exercise.  She states she did not take the PPI because she does "not have reflux."  She states she came in today because for the last week she has been having oxygen saturations in the 80s, when she ambulates.  Last evening EMS came to her home and she did not get transferred.  Today her oxygen saturation was 71% while she was ambulating at home.  She came here by private vehicle, on arrival her saturation was 79% on room air and improved to 95% with nasal cannula oxygen.  She denies chest pain.  She states she is having "panic attacks," when she walks.  She denies chest pain, focal weakness or paresthesia.  She is not coughing or producing sputum.  She feels short of breath even at rest.    Past Medical History:  Diagnosis Date   Arthritis    pain and oa  right knee and pain in rt leg and foot--also pain left knee-but right is worse   Atrial fibrillation (HCC)    Back pain    lumbar bulging disk   Contact dermatitis    underside of left leg--always in the same spots--comes and goes   Diverticulosis    GERD (gastroesophageal reflux disease)    diet control   Hemorrhoids    Obesity    Persistent atrial fibrillation [facility])     Patient Active Problem List   Diagnosis Date Noted   DOE (dyspnea on exertion)  07/30/2020   Morbid obesity due to excess calories ([place]) 07/30/2020   Atrial fibrillation with RVR ([place]) 12/16/2019   Atrial fibrillation ([place])    Varicose veins of lower extremities with other complications 29/51/8841   Chronic venous insufficiency 12/17/2012   Postop Hyponatremia 11/05/2011   OA (osteoarthritis) of knee 11/03/2011    Past Surgical History:  Procedure Laterality Date   ATRIAL FIBRILLATION ABLATION N/A 06/07/2019   Procedure: ATRIAL FIBRILLATION ABLATION;  Surgeon: Thompson Grayer, MD;  Location: [facility];  Service: Cardiovascular;  Laterality: N/A;   CARDIOVERSION N/A 05/09/2019   Procedure: CARDIOVERSION;  Surgeon: Buford Dresser, MD;  Location: [facility] ENDOSCOPY;  Service: Cardiovascular;  Laterality: N/A;   CATARACT EXTRACTION W/PHACO Left 02/22/2015   Procedure: CATARACT EXTRACTION PHACO AND INTRAOCULAR LENS PLACEMENT LEFT EYE CDE=5.41;  Surgeon: Tonny Branch, MD;  Location: [facility] ORS;  Service: Ophthalmology;  Laterality: Left;   CATARACT EXTRACTION W/PHACO Right 03/26/2015   Procedure: CATARACT EXTRACTION PHACO AND INTRAOCULAR LENS PLACEMENT ([place]);  Surgeon: Tonny Branch, MD;  Location: [facility] ORS;  Service: Ophthalmology;  Laterality: Right;  CDE:5.01   CHOLECYSTECTOMY     surgery for broken right elbow Right    TOTAL KNEE ARTHROPLASTY  11/03/2011   Procedure: TOTAL KNEE ARTHROPLASTY;  Surgeon: Gearlean Alf, MD;  Location: [facility] ORS;  Service:  Orthopedics;  Laterality: Right;   TUBAL LIGATION       OB History   No obstetric history on file.     Family History  Problem Relation Age of Onset   Diabetes Mother    Stroke Mother    Stroke Father     Social History   Tobacco Use   Smoking status: Former    Packs/day: 1.00    Years: 38.00    Pack years: 38.00    Types: Cigarettes    Start date: 06/25/1957    Quit date: 04/28/1996    Years since quitting: 25.0   Smokeless tobacco: Never   Tobacco comments:    quit smoking 1998  Vaping Use   Vaping Use:  Never used  Substance Use Topics   Alcohol use: Not Currently    Alcohol/week: 1.0 standard drink    Types: 1 Cans of beer per week   Drug use: No    Home Medications Prior to Admission medications   Medication Sig Start Date End Date Taking? Authorizing Provider  acetaminophen (TYLENOL) 325 MG tablet Take 650 mg by mouth every 6 (six) hours as needed for moderate pain (headache).   Yes [provider]  budesonide-formoterol (SYMBICORT) 160-4.5 MCG/ACT inhaler Inhale 2 puffs into the lungs 2 (two) times daily.   Yes [provider]  citalopram (CELEXA) 20 MG tablet Take 20 mg by mouth daily. 10/28/12  Yes [provider]  clonazePAM (KLONOPIN) 0.5 MG tablet Take 0.5 mg by mouth 2 (two) times daily as needed for anxiety.   Yes [provider]  diltiazem (CARDIZEM CD) 300 MG 24 hr capsule Take 1 capsule (300 mg total) by mouth daily. 07/24/20  Yes Branch, Alphonse Guild, MD  diltiazem (CARDIZEM) 30 MG tablet Take 1 tablet (30 mg total) by mouth every 8 (eight) hours as needed (palpitations). 12/17/19  Yes Johnson, Clanford L, MD  diltiazem (CARDIZEM) 30 MG tablet Take 30 mg by mouth daily as needed (afib).   Yes [provider]  diphenhydrAMINE (BENADRYL) 25 MG tablet Take 25 mg by mouth daily as needed for allergies or sleep.    Yes [provider]  diphenhydrAMINE-APAP, sleep, (EXCEDRIN PM) 38-500 MG TABS Take 1 tablet by mouth at bedtime as needed (sleep/pain).   Yes [provider]  furosemide (LASIX) 20 MG tablet TAKE 1 TABLET BY MOUTH EVERY DAY 10/02/20  Yes Branch, Alphonse Guild, MD  metoprolol tartrate (LOPRESSOR) 25 MG tablet Take 1 tablet (25 mg total) by mouth 2 (two) times daily. 03/01/21  Yes Allred, Jeneen Rinks, MD  rivaroxaban (XARELTO) 20 MG TABS tablet Take 20 mg by mouth daily.   Yes [provider]    Allergies    Codeine and Meloxicam  Review of Systems   Review of Systems  All other systems reviewed and are  negative.  Physical Exam Updated Vital Signs BP 140/86    Pulse 68    Temp 98.9 F (37.2 C) (Oral)    Resp (!) 26    Ht 5\' 5"  (1.651 m)    Wt 117.9 kg    SpO2 94%    BMI 43.27 kg/m   Physical Exam Vitals and nursing note reviewed.  Constitutional:      General: She is not in acute distress.    Appearance: She is well-developed. She is obese. She is not ill-appearing, toxic-appearing or diaphoretic.  HENT:     Head: Normocephalic and atraumatic.  Eyes:     Conjunctiva/sclera: Conjunctivae normal.  Pupils: Pupils are equal, round, and reactive to light.  Neck:     Trachea: Phonation normal.  Cardiovascular:     Rate and Rhythm: Normal rate and regular rhythm.     Comments: Cannot appreciate any JVD.  She has marked peripheral edema. Pulmonary:     Effort: Pulmonary effort is normal.     Breath sounds: Rales present.  Chest:     Chest wall: No tenderness.  Abdominal:     General: There is no distension.     Palpations: Abdomen is soft.     Tenderness: There is no abdominal tenderness. There is no guarding.  Musculoskeletal:        General: Swelling present. Normal range of motion.     Cervical back: Normal range of motion and neck supple.     Right lower leg: Edema present.     Left lower leg: Edema present.     Comments: Bilateral symmetric edema.  Skin:    General: Skin is warm and dry.  Neurological:     Mental Status: She is alert and oriented to person, place, and time.     Motor: No abnormal muscle tone.  Psychiatric:        Mood and Affect: Mood normal.        Behavior: Behavior normal.        Thought Content: Thought content normal.        Judgment: Judgment normal.    ED Results / Procedures / Treatments   Labs (all labs ordered are listed, but only abnormal results are displayed) Labs Reviewed  COMPREHENSIVE METABOLIC PANEL - Abnormal; Notable for the following components:      Result Value   Sodium 134 (*)    Glucose, Bld 135 (*)    Calcium 8.7 (*)     Total Bilirubin 2.3 (*)    All other components within normal limits  CBC WITH DIFFERENTIAL/PLATELET - Abnormal; Notable for the following components:   WBC 14.0 (*)    Neutro Abs 11.9 (*)    All other components within normal limits  BRAIN NATRIURETIC PEPTIDE - Abnormal; Notable for the following components:   B Natriuretic Peptide 217.0 (*)    All other components within normal limits  RESP PANEL BY RT-PCR (FLU A&B, COVID) [id number]  D-DIMER, QUANTITATIVE    EKG EKG Interpretation  Date/Time:  Monday April 22 2021 11:58:17 EST Ventricular Rate:  68 PR Interval:  170 QRS Duration: 101 QT Interval:  439 QTC Calculation: 467 R Axis:   31 Text Interpretation: Sinus rhythm Low voltage, precordial leads Nonspecific T abnormalities, anterior leads Since last tracing  in Sinus rhythm and QT has shortened Confirmed by Daleen Bo [phone]) on 04/22/2021 2:24:42 PM  Radiology DG Chest 2 View  Result Date: 04/22/2021 CLINICAL DATA:  Dyspnea, shortness of breath, hypoxemia, history atrial fibrillation, asthma EXAM: CHEST - 2 VIEW COMPARISON:  07/17/2020 FINDINGS: Enlargement of cardiac silhouette with pulmonary vascular congestion. Atherosclerotic calcification aorta. Interstitial infiltrates likely pulmonary edema and CHF. Tiny bibasilar effusions and minimal atelectasis. No pneumothorax. Bones demineralized. IMPRESSION: CHF. Aortic Atherosclerosis (ICD10-I70.0). Electronically Signed   By: Lavonia Dana M.D.   On: 04/22/2021 12:00    Procedures Procedures   Medications Ordered in ED Medications  furosemide (LASIX) injection 40 mg (has no administration in time range)    ED Course  I have reviewed the triage vital signs and the nursing notes.  Pertinent labs & imaging results that were available during my care of the  patient were reviewed by me and considered in my medical decision making (see chart for details).    MDM Rules/Calculators/A&P                          Patient  Vitals for the past 24 hrs:  BP Temp Temp src Pulse Resp SpO2 Height Weight  04/22/21 1400 140/86 -- -- 68 (!) 26 94 % -- --  04/22/21 1300 131/67 -- -- 66 15 93 % -- --  04/22/21 1200 (!) 135/54 -- -- 69 (!) 24 94 % -- --  04/22/21 1100 (!) 143/73 -- -- 72 19 95 % -- --  04/22/21 1058 (!) 157/73 -- -- -- -- -- -- --  04/22/21 1052 -- 98.9 F (37.2 C) Oral 74 (!) 26 95 % -- --  04/22/21 1049 -- -- -- -- -- -- 5\' 5"  (1.651 m) 117.9 kg  04/22/21 1048 -- -- -- -- -- (!) 79 % -- --    2:54 PM Reevaluation with update and discussion. After initial assessment and treatment, an updated evaluation reveals she remains comfortable sitting in chair with normal oxygen with oxygen supplementation.  She is agreeable to hospitalization. Daleen Bo   Medical Decision Making:  This patient is presenting for evaluation of shortness of breath with hypoxia, which does require a range of treatment options, and is a complaint that involves a high risk of morbidity and mortality. The differential diagnoses include ACS, PE, viral infection, chronic lung disease. I decided to review old records, and in summary elderly obese female with prior episodes of shortness of breath with dyspnea on exertion, that did not improve with prior treatment and evaluation by pulmonary.  Presenting now with very low oxygenation, documented 79% on room air at triage today..  I did not require additional historical information from anyone.  Clinical Laboratory Tests Ordered, included CBC, Metabolic panel, and BMP, viral panel . Review indicates normal except [place] high, BNP high, sodium low, glucose high, calcium low. Radiologic Tests Ordered, included chest x-ray.  I independently Visualized: Radiographic images, which show cardiomegaly and pulmonary vascular congestion  Cardiac Monitor Tracing which shows sinus rhythm   Critical Interventions-clinical evaluation, titration nasal cannula oxygen to maintain normal oxygen  saturation.  Laboratory testing, radiography, observation and reassessment  After These Interventions, the Patient was reevaluated and was found with hypoxia due to congestive heart failure, with elevated BNP from baseline.  Doubt ACS, metabolic disorder or impending vascular collapse.  She requires hospitalization for treatment with oxygen by nasal cannula, cardiac echo and diuresis.  Consider cardiology consultation as well.  CRITICAL CARE-no Performed by: Daleen Bo  Nursing Notes Reviewed/ Care Coordinated Applicable Imaging Reviewed Interpretation of Laboratory Data incorporated into ED treatment   2:33 PM-Consult complete with hospitalist. Patient case explained and discussed.  He agrees to admit patient for further evaluation and treatment. Call ended at 2:50 PM     Final Clinical Impression(s) / ED Diagnoses Final diagnoses:  Acute congestive heart failure, unspecified heart failure type ([place])  Hypoxia    Rx / DC Orders ED Discharge Orders     None        Daleen Bo, MD 04/22/21 1950

## 2021-04-22 NOTE — H&P (Signed)
History and Physical    Brooke Luna GUY:[id number] DOB: 1945/06/15 DOA: 04/22/2021  PCP: [name], MD   Patient coming from: home   I have personally briefly reviewed patient's old medical records in [place]  Chief Complaint: SOB, orthopnea, increase LE swelling  and hypoxia.  HPI: Brooke Luna is a 75 y.o. female with medical history significant of COPD, obesity, depression/anxiety, hypertension, atrial fibrillation on chronic anticoagulation (Xarelto) and GERD; who presented to the emergency department secondary to shortness of breath, dyspnea exertion, hypoxia and increased lower extremity swelling.  Patient reports symptom has been present for the last 1-2 weeks and worsening.  Patient was previously exterminated for similar complaints in the past (month ago) and at that time and work-up suggested a component of obesity hypoventilation syndrome.  Patient reports that she has not lost any significant weight since then and has recently noticed on top of this dyspnea on exertion also development of shortness of breath while at rest, difficulty laying down flat, increased lower extremity swelling and also hypoxia.  On the day of admission at the time the EMS arrived to her house patient was found with O2 sats in the mid 70s.  Patient is vaccinated against COVID (no booster); COVID PCR in the ED negative at time of admission.  ED Course: Chest x-ray demonstrating cardiomegaly and vascular congestion; elevated BNP and requirement of 3 L nasal cannula supplementation to improve hypoxia.  The rest of her vital signs and blood work unremarkable.  TRH has been called to place in the hospital for further evaluation and management of acute respiratory failure with hypoxia in the setting of CHF exacerbation.  Review of Systems: As per HPI otherwise all other systems reviewed and are negative.  Past Medical History:  Diagnosis Date   Arthritis    pain and oa  right knee and pain in  rt leg and foot--also pain left knee-but right is worse   Atrial fibrillation (HCC)    Back pain    lumbar bulging disk   Contact dermatitis    underside of left leg--always in the same spots--comes and goes   Diverticulosis    GERD (gastroesophageal reflux disease)    diet control   Hemorrhoids    Obesity    Persistent atrial fibrillation ([place])     Past Surgical History:  Procedure Laterality Date   ATRIAL FIBRILLATION ABLATION N/A 06/07/2019   Procedure: ATRIAL FIBRILLATION ABLATION;  Surgeon: Thompson Grayer, MD;  Location: [facility];  Service: Cardiovascular;  Laterality: N/A;   CARDIOVERSION N/A 05/09/2019   Procedure: CARDIOVERSION;  Surgeon: Buford Dresser, MD;  Location: [facility];  Service: Cardiovascular;  Laterality: N/A;   CATARACT EXTRACTION W/PHACO Left 02/22/2015   Procedure: CATARACT EXTRACTION PHACO AND INTRAOCULAR LENS PLACEMENT LEFT EYE CDE=5.41;  Surgeon: Tonny Branch, MD;  Location: [facility];  Service: Ophthalmology;  Laterality: Left;   CATARACT EXTRACTION W/PHACO Right 03/26/2015   Procedure: CATARACT EXTRACTION PHACO AND INTRAOCULAR LENS PLACEMENT ([place]);  Surgeon: Tonny Branch, MD;  Location: [facility];  Service: Ophthalmology;  Laterality: Right;  CDE:5.01   CHOLECYSTECTOMY     surgery for broken right elbow Right    TOTAL KNEE ARTHROPLASTY  11/03/2011   Procedure: TOTAL KNEE ARTHROPLASTY;  Surgeon: Gearlean Alf, MD;  Location: [facility];  Service: Orthopedics;  Laterality: Right;   TUBAL LIGATION      Social History  reports that she quit smoking about 25 years ago. Her smoking use included cigarettes. She started  smoking about 63 years ago. She has a 38.00 pack-year smoking history. She has never used smokeless tobacco. She reports that she does not currently use alcohol after a past usage of about 1.0 standard drink per week. She reports that she does not use drugs.  Allergies  Allergen Reactions   Codeine Shortness Of Breath and Itching    Meloxicam Other (See Comments)    Stomach iritation     Family History  Problem Relation Age of Onset   Diabetes Mother    Stroke Mother    Stroke Father     Prior to Admission medications   Medication Sig Start Date End Date Taking? Authorizing Provider  acetaminophen (TYLENOL) 325 MG tablet Take 650 mg by mouth every 6 (six) hours as needed for moderate pain (headache).   Yes [provider]  budesonide-formoterol (SYMBICORT) 160-4.5 MCG/ACT inhaler Inhale 2 puffs into the lungs 2 (two) times daily.   Yes [provider]  citalopram (CELEXA) 20 MG tablet Take 20 mg by mouth daily. 10/28/12  Yes [provider]  clonazePAM (KLONOPIN) 0.5 MG tablet Take 0.5 mg by mouth 2 (two) times daily as needed for anxiety.   Yes [provider]  diltiazem (CARDIZEM CD) 300 MG 24 hr capsule Take 1 capsule (300 mg total) by mouth daily. 07/24/20  Yes Branch, Alphonse Guild, MD  diltiazem (CARDIZEM) 30 MG tablet Take 1 tablet (30 mg total) by mouth every 8 (eight) hours as needed (palpitations). 12/17/19  Yes Johnson, Clanford L, MD  diltiazem (CARDIZEM) 30 MG tablet Take 30 mg by mouth daily as needed (afib).   Yes [provider]  diphenhydrAMINE (BENADRYL) 25 MG tablet Take 25 mg by mouth daily as needed for allergies or sleep.    Yes [provider]  diphenhydrAMINE-APAP, sleep, (EXCEDRIN PM) 38-500 MG TABS Take 1 tablet by mouth at bedtime as needed (sleep/pain).   Yes [provider]  furosemide (LASIX) 20 MG tablet TAKE 1 TABLET BY MOUTH EVERY DAY 10/02/20  Yes Branch, Alphonse Guild, MD  metoprolol tartrate (LOPRESSOR) 25 MG tablet Take 1 tablet (25 mg total) by mouth 2 (two) times daily. 03/01/21  Yes Allred, Jeneen Rinks, MD  rivaroxaban (XARELTO) 20 MG TABS tablet Take 20 mg by mouth daily.   Yes [provider]    Physical Exam: Vitals:   04/22/21 1100 04/22/21 1200 04/22/21 1300 04/22/21 1400  BP: (!) 143/73 (!) 135/54 131/67 140/86   Pulse: 72 69 66 68  Resp: 19 (!) 24 15 (!) 26  Temp:      TempSrc:      SpO2: 95% 94% 93% 94%  Weight:      Height:        Constitutional: Able to speak in full sentences and reporting no chest pain, nausea or vomiting.  Patient is afebrile Vitals:   04/22/21 1100 04/22/21 1200 04/22/21 1300 04/22/21 1400  BP: (!) 143/73 (!) 135/54 131/67 140/86  Pulse: 72 69 66 68  Resp: 19 (!) 24 15 (!) 26  Temp:      TempSrc:      SpO2: 95% 94% 93% 94%  Weight:      Height:       Eyes: PERRL, lids and conjunctivae normal, no icterus, no nystagmus. ENMT: Mucous membranes are moist. Posterior pharynx clear of any exudate or lesions. Neck: normal, supple, no masses, no thyromegaly; + JVD with body habitus. Respiratory: Crackles at the bases heard on examination; no using accessory muscles.  No  wheezing appreciated. Cardiovascular: Rate controlled, no rubs, no gallops; 1-2+ edema appreciated bilaterally. Abdomen: Obese, no tenderness, no masses palpated. No hepatosplenomegaly. Bowel sounds positive.  Musculoskeletal: no cyanosis or clubbing; no joint swelling.  No contractures. Skin: no rashes, no induration or petechiae. Neurologic: CN 2-12 grossly intact. Sensation intact, DTR normal. Strength 5/5 in all 4.  Psychiatric: Normal judgment and insight. Alert and oriented x 3. Normal mood.    Labs on Admission: I have personally reviewed following labs and imaging studies  CBC: Recent Labs  Lab 04/22/21 1133  WBC 14.0*  NEUTROABS 11.9*  HGB 13.4  HCT 42.0  MCV 90.9  PLT 485    Basic Metabolic Panel: Recent Labs  Lab 04/22/21 1133  NA 134*  K 4.0  CL 99  CO2 24  GLUCOSE 135*  BUN 9  CREATININE 0.73  CALCIUM 8.7*    GFR: Estimated Creatinine Clearance: 78.1 mL/min (by C-G formula based on SCr of 0.73 mg/dL).  Liver Function Tests: Recent Labs  Lab 04/22/21 1133  AST 21  ALT 18  ALKPHOS 73  BILITOT 2.3*  PROT 7.3  ALBUMIN 4.0    Urine analysis:    Component  Value Date/Time   COLORURINE YELLOW 10/23/2011 1340   APPEARANCEUR CLEAR 10/23/2011 1340   LABSPEC 1.021 10/23/2011 1340   PHURINE 6.0 10/23/2011 1340   GLUCOSEU NEGATIVE 10/23/2011 1340   [place] 10/23/2011 1340   BILIRUBINUR NEGATIVE 10/23/2011 1340   KETONESUR NEGATIVE 10/23/2011 1340   PROTEINUR NEGATIVE 10/23/2011 1340   UROBILINOGEN 0.2 10/23/2011 1340   NITRITE NEGATIVE 10/23/2011 1340   LEUKOCYTESUR NEGATIVE 10/23/2011 1340    Radiological Exams on Admission: DG Chest 2 View  Result Date: 04/22/2021 CLINICAL DATA:  Dyspnea, shortness of breath, hypoxemia, history atrial fibrillation, asthma EXAM: CHEST - 2 VIEW COMPARISON:  07/17/2020 FINDINGS: Enlargement of cardiac silhouette with pulmonary vascular congestion. Atherosclerotic calcification aorta. Interstitial infiltrates likely pulmonary edema and CHF. Tiny bibasilar effusions and minimal atelectasis. No pneumothorax. Bones demineralized. IMPRESSION: CHF. Aortic Atherosclerosis (ICD10-I70.0). Electronically Signed   By: Lavonia Dana M.D.   On: 04/22/2021 12:00    EKG: Independently reviewed.  Sinus rhythm appreciated; rate controlled.  No acute ischemic changes.  Assessment/Plan 1-Acute respiratory failure with hypoxia (HCC) -In the setting of CHF exacerbation and component of obesity hypoventilation syndrome. -Good response to 3 L oxygen supplementation -Patient placed on telemetry, will provide IV diuresis, daily weights, low-sodium diet, strict I's and O's and TED hose placement. -Will repeat 2D echo -Follow clinical response.  2-history of COPD -Currently no wheezing -Resume home bronchodilator management.  3-paroxysmal atrial fibrillation -Currently sinus rhythm -Continue the use of beta-blocker and Xarelto for secondary prevention.  4-gastroesophageal reflux disease -Continue PPI.  5-depression/anxiety -Continue the use of Celexa and Klonopin -stable mood currently.  6-morbid obesity -Low calorie  diet, portion control and increase physical activity discussed with patient. -Patient will benefit of repeating sleep study as an outpatient -Body mass index is 43.27 kg/m.  7-essential hypertension -Overall stable and well-controlled -Continue to follow vital signs -Heart healthy/low-sodium diet has been ordered -Continue to follow current antihypertensive agents.  DVT prophylaxis: xarelto Code Status:   Full code Family Communication:  No family members at bedside. Disposition Plan:   Patient is from:  home  Anticipated DC to:  Home   Anticipated DC date:  3 days (if adequately responded to diuresis management)  Anticipated DC barriers: Stabilization of volume status and breathing.   Consults called:  None  Admission status:  Telemetry, LOS > 2 midnights, inpatient.  Severity of Illness: The appropriate patient status for this patient is INPATIENT. Inpatient status is judged to be reasonable and necessary in order to provide the required intensity of service to ensure the patient's safety. The patient's presenting symptoms, physical exam findings, and initial radiographic and laboratory data in the context of their chronic comorbidities is felt to place them at high risk for further clinical deterioration. Furthermore, it is not anticipated that the patient will be medically stable for discharge from the hospital within 2 midnights of admission.   * I certify that at the point of admission it is my clinical judgment that the patient will require inpatient hospital care spanning beyond 2 midnights from the point of admission due to high intensity of service, high risk for further deterioration and high frequency of surveillance required.Barton Dubois MD [facility]  How to contact the [facility] Attending or Consulting provider [place] or covering provider during after hours [place], for this patient?   Check the care team in [facility] and look for a) attending/consulting TR[facility]  provider listed and b) the [facility] team listed Log into [email or web address] and use [place]'s universal password to access. If you do not have the password, please contact the hospital operator. Locate the [facility] provider you are looking for under [facility] and page to a number that you can be directly reached. If you still have difficulty reaching the provider, please page the [facility] (Director on Call) for the Hospitalists listed on amion for assistance.  04/22/2021, 2:57 PM

## 2021-04-23 ENCOUNTER — Inpatient Hospital Stay (HOSPITAL_COMMUNITY): Payer: Medicare Other

## 2021-04-23 DIAGNOSIS — I509 Heart failure, unspecified: Secondary | ICD-10-CM | POA: Diagnosis not present

## 2021-04-23 DIAGNOSIS — J9601 Acute respiratory failure with hypoxia: Secondary | ICD-10-CM | POA: Diagnosis not present

## 2021-04-23 DIAGNOSIS — I5031 Acute diastolic (congestive) heart failure: Secondary | ICD-10-CM | POA: Diagnosis not present

## 2021-04-23 LAB — ECHOCARDIOGRAM COMPLETE
AR max vel: 2.03 cm2
AV Area VTI: 2.28 cm2
AV Area mean vel: 2.27 cm2
AV Mean grad: 3.5 mmHg
AV Peak grad: 6.8 mmHg
Ao pk vel: 1.31 m/s
Area-P 1/2: 4.21 cm2
Calc EF: 52.9 %
Height: 65 in
MV VTI: 2.33 cm2
S' Lateral: 3.2 cm
Single Plane A2C EF: 63.3 %
Single Plane A4C EF: 40.1 %
Weight: 4160 oz

## 2021-04-23 LAB — BASIC METABOLIC PANEL
Anion gap: 10 (ref 5–15)
BUN: 9 mg/dL (ref 8–23)
CO2: 26 mmol/L (ref 22–32)
Calcium: 8.4 mg/dL — ABNORMAL LOW (ref 8.9–10.3)
Chloride: 98 mmol/L (ref 98–111)
Creatinine, Ser: 0.68 mg/dL (ref 0.44–1.00)
GFR, Estimated: >60 mL/min (ref >60)
Glucose, Bld: 130 mg/dL — ABNORMAL HIGH (ref 70–99)
Potassium: 3.6 mmol/L (ref 3.5–5.1)
Sodium: 134 mmol/L — ABNORMAL LOW (ref 135–145)

## 2021-04-23 MED ORDER — CLONAZEPAM 0.5 MG PO TABS
0.5000 mg | ORAL_TABLET | Freq: Three times a day (TID) | ORAL | Status: DC | PRN
  Administered 2021-04-23 – 2021-04-26 (×4): 0.5 mg via ORAL
  Filled 2021-04-23 (×5): qty 1

## 2021-04-23 NOTE — Progress Notes (Incomplete)
*  PRELIMINARY RESULTS* Echocardiogram 2D Echocardiogram has been performed.  Brooke Luna 04/23/2021, 2:17 PM

## 2021-04-23 NOTE — Progress Notes (Signed)
PROGRESS NOTE    Brooke Luna  IEP:[id number] DOB: Feb 08, 1946 DOA: 04/22/2021 PCP: [name], MD    Chief Complaint  Patient presents with   Shortness of Breath    Brief Narrative:  Brooke Luna is a 75 y.o. female with medical history significant of COPD, obesity, depression/anxiety, hypertension, atrial fibrillation on chronic anticoagulation (Xarelto) and GERD; who presented to the emergency department secondary to shortness of breath, dyspnea exertion, hypoxia and increased lower extremity swelling.  Patient reports symptom has been present for the last 1-2 weeks and worsening.  Patient was previously exterminated for similar complaints in the past (month ago) and at that time and work-up suggested a component of obesity hypoventilation syndrome.  Patient reports that she has not lost any significant weight since then and has recently noticed on top of this dyspnea on exertion also development of shortness of breath while at rest, difficulty laying down flat, increased lower extremity swelling and also hypoxia.  On the day of admission at the time the EMS arrived to her house patient was found with O2 sats in the mid 70s.   Patient is vaccinated against COVID (no booster); COVID PCR in the ED negative at time of admission.   ED Course: Chest x-ray demonstrating cardiomegaly and vascular congestion; elevated BNP and requirement of 3 L nasal cannula supplementation to improve hypoxia.  The rest of her vital signs and blood work unremarkable.  TRH has been called to place in the hospital for further evaluation and management of acute respiratory failure with hypoxia in the setting of CHF exacerbation.   Assessment & Plan: 1-acute respiratory failure with hypoxia -In the setting of acute on chronic diastolic heart failure and obesity hypoventilation syndrome -Continue diuresis -Continue to follow strict I's and O's, daily weights and low-sodium diet. -Continue to wean off oxygen  supplementation as tolerated -2D echo demonstrating no wall motion abnormalities and preserved ejection fraction.  2-history of COPD -No wheezing and able to speak in full sentences. -Continue home bronchodilator management.  3-gastroesophageal reflux disease -Continue PPI.  4-paroxysmal atrial fibrillation -Currently in sinus rhythm -Continue telemetry monitoring -Continue the use of beta-blocker and Xarelto. -Continue patient follow-up with cardiology service.  5-depression/anxiety -Patient reports experiencing anxiety attacks especially at nighttime -Will continue the use of Celexa and adjust her Klonopin dose for better mood control. -No hallucinations currently.  6-morbid obesity -Body mass index is 43.27 kg/m. -Low calorie diet, portion control and increase physical activity has been discussed with patient.  7-hypertension -Stable vital signs -Continue heart healthy diet -Continue current antihypertensive regimen.   DVT prophylaxis: Chronically on Xarelto Code Status: Full code. Family Communication: Husband at bedside. Disposition:   Status is: Inpatient   Consultants:  None   Procedures:  2D echo 1. Left ventricular ejection fraction, by estimation, is 60 to 65%. The  left ventricle has normal function. The left ventricle has no regional  wall motion abnormalities. Left ventricular diastolic function could not  be evaluated.   2. Right ventricular systolic function is normal. The right ventricular  size is normal. There is normal pulmonary artery systolic pressure. The  estimated right ventricular systolic pressure is 66.0 mmHg.   3. The trivial pericardial effusion is anterior to the right ventricle.   4. The mitral valve is abnormal. Trivial mitral valve regurgitation.   5. The aortic valve is tricuspid. Aortic valve regurgitation is not  visualized.   6. The inferior vena cava is normal in size with greater than 50%  respiratory  variability,  suggesting right atrial pressure of 3 mmHg.  Antimicrobials:  None  Subjective: Reporting orthopnea and dyspnea on exertion.  Also experiencing difficulty sleeping at nighttime intermittently having anxiety attacks.  Objective: Vitals:   04/22/21 1650 04/22/21 2000 04/23/21 0413 04/23/21 1325  BP: (!) 143/65 130/62 (!) 172/74 125/60  Pulse: 74 72 90 71  Resp: 18 18 19 18   Temp: 98.2 F (36.8 C) 98.6 F (37 C) 99.6 F (37.6 C) 98.2 F (36.8 C)  TempSrc: Oral Oral  Oral  SpO2: 95% 94% 93% 95%  Weight:      Height:        Intake/Output Summary (Last 24 hours) at 04/23/2021 1648 Last data filed at 04/23/2021 1343 Gross per 24 hour  Intake 720 ml  Output 2000 ml  Net -1280 ml   Filed Weights   04/22/21 1049  Weight: 117.9 kg    Examination:  General exam: No chest pain, no palpitations, good urine output reported.  Still requiring 3 L nasal cannula supplementation and expressing short winded sensation with activity. Respiratory system: Good air movement bilaterally, no wheezing, positive fine crackles.  No using accessory muscles. Cardiovascular system: S1 & S2 heard, RRR. No JVD, murmurs, rubs, gallops or clicks.  Edema appreciated bilaterally. Gastrointestinal system: Abdomen is nondistended, soft and nontender. No organomegaly or masses felt. Normal bowel sounds heard. Central nervous system: Alert and oriented. No focal neurological deficits. Extremities: No cyanosis or clubbing. Skin: No petechiae. Psychiatry: Judgement and insight appear normal. Mood & affect appropriate.     Data Reviewed: I have personally reviewed following labs and imaging studies  CBC: Recent Labs  Lab 04/22/21 1133  WBC 14.0*  NEUTROABS 11.9*  HGB 13.4  HCT 42.0  MCV 90.9  PLT 035    Basic Metabolic Panel: Recent Labs  Lab 04/22/21 1133 04/22/21 1515 04/23/21 0552  NA 134*  --  134*  K 4.0  --  3.6  CL 99  --  98  CO2 24  --  26  GLUCOSE 135*  --  130*  BUN 9  --  9   CREATININE 0.73  --  0.68  CALCIUM 8.7*  --  8.4*  MG  --  2.1  --   PHOS  --  3.4  --     GFR: Estimated Creatinine Clearance: 78.1 mL/min (by C-G formula based on SCr of 0.68 mg/dL).  Liver Function Tests: Recent Labs  Lab 04/22/21 1133  AST 21  ALT 18  ALKPHOS 73  BILITOT 2.3*  PROT 7.3  ALBUMIN 4.0    CBG: No results for input(s): GLUCAP in the last 168 hours.   Recent Results (from the past 240 hour(s))  Resp Panel by RT-PCR (Flu A&B, Covid) Nasopharyngeal Swab     Status: None   Collection Time: 04/22/21 12:11 PM   Specimen: Nasopharyngeal Swab; Nasopharyngeal(NP) swabs in vial transport medium  Result Value Ref Range Status   SARS Coronavirus 2 by RT PCR NEGATIVE NEGATIVE Final    Comment: (NOTE) SARS-CoV-2 target nucleic acids are NOT DETECTED.  The SARS-CoV-2 RNA is generally detectable in upper respiratory specimens during the acute phase of infection. The lowest concentration of SARS-CoV-2 viral copies this assay can detect is 138 copies/mL. A negative result does not preclude SARS-Cov-2 infection and should not be used as the sole basis for treatment or other patient management decisions. A negative result may occur with  improper specimen collection/handling, submission of specimen other than nasopharyngeal swab, presence of  viral mutation(s) within the areas targeted by this assay, and inadequate number of viral copies(<138 copies/mL). A negative result must be combined with clinical observations, patient history, and epidemiological information. The expected result is Negative.  Fact Sheet for Patients:  EntrepreneurPulse.com.au  Fact Sheet for Healthcare Providers:  IncredibleEmployment.be  This test is no t yet approved or cleared by the [place] FDA and  has been authorized for detection and/or diagnosis of SARS-CoV-2 by FDA under an Emergency Use Authorization (EUA). This EUA will remain  in effect  (meaning this test can be used) for the duration of the COVID-19 declaration under Section 564(b)(1) of the Act, 21 U.S.C.section 360bbb-3(b)(1), unless the authorization is terminated  or revoked sooner.       Influenza A by PCR NEGATIVE NEGATIVE Final   Influenza B by PCR NEGATIVE NEGATIVE Final    Comment: (NOTE) The Xpert Xpress SARS-CoV-2/FLU/RSV plus assay is intended as an aid in the diagnosis of influenza from Nasopharyngeal swab specimens and should not be used as a sole basis for treatment. Nasal washings and aspirates are unacceptable for Xpert Xpress SARS-CoV-2/FLU/RSV testing.  Fact Sheet for Patients: EntrepreneurPulse.com.au  Fact Sheet for Healthcare Providers: IncredibleEmployment.be  This test is not yet approved or cleared by the [place] FDA and has been authorized for detection and/or diagnosis of SARS-CoV-2 by FDA under an Emergency Use Authorization (EUA). This EUA will remain in effect (meaning this test can be used) for the duration of the COVID-19 declaration under Section 564(b)(1) of the Act, 21 U.S.C. section 360bbb-3(b)(1), unless the authorization is terminated or revoked.  Performed at [facility], [address], [place], [place] [address]      Radiology Studies: DG Chest 2 View  Result Date: 04/22/2021 CLINICAL DATA:  Dyspnea, shortness of breath, hypoxemia, history atrial fibrillation, asthma EXAM: CHEST - 2 VIEW COMPARISON:  07/17/2020 FINDINGS: Enlargement of cardiac silhouette with pulmonary vascular congestion. Atherosclerotic calcification aorta. Interstitial infiltrates likely pulmonary edema and CHF. Tiny bibasilar effusions and minimal atelectasis. No pneumothorax. Bones demineralized. IMPRESSION: CHF. Aortic Atherosclerosis (ICD10-I70.0). Electronically Signed   By: Lavonia Dana M.D.   On: 04/22/2021 12:00   ECHOCARDIOGRAM COMPLETE  Result Date: 04/23/2021    ECHOCARDIOGRAM REPORT   Patient  Name:   LEONE MOBLEY Date of Exam: 04/23/2021 Medical Rec #:  [id number]       Height:       65.0 in Accession #:    [id number]      Weight:       260.0 lb Date of Birth:  [birth year]       BSA:          2.211 m Patient Age:    89 years        BP:           172/74 mmHg Patient Gender: F               HR:           79 bpm. Exam Location:  [facility] Procedure: 2D Echo, Cardiac Doppler and Color Doppler Indications:    Congestive Heart Failure  History:        Patient has prior history of Echocardiogram examinations, most                 recent 10/25/2019. CAD, Arrythmias:Atrial Fibrillation,                 Signs/Symptoms:Dyspnea; Risk Factors:Former Smoker. Ablation.  Sonographer:    Wenda Low  Referring Phys: Felicity  1. Left ventricular ejection fraction, by estimation, is 60 to 65%. The left ventricle has normal function. The left ventricle has no regional wall motion abnormalities. Left ventricular diastolic function could not be evaluated.  2. Right ventricular systolic function is normal. The right ventricular size is normal. There is normal pulmonary artery systolic pressure. The estimated right ventricular systolic pressure is 42.5 mmHg.  3. The trivial pericardial effusion is anterior to the right ventricle.  4. The mitral valve is abnormal. Trivial mitral valve regurgitation.  5. The aortic valve is tricuspid. Aortic valve regurgitation is not visualized.  6. The inferior vena cava is normal in size with greater than 50% respiratory variability, suggesting right atrial pressure of 3 mmHg. Comparison(s): Changes from prior study are noted. 10/25/2019: LVEF 60-65%. FINDINGS  Left Ventricle: Left ventricular ejection fraction, by estimation, is 60 to 65%. The left ventricle has normal function. The left ventricle has no regional wall motion abnormalities. The left ventricular internal cavity size was normal in size. There is  no left ventricular hypertrophy. Left ventricular  diastolic function could not be evaluated due to atrial fibrillation. Left ventricular diastolic function could not be evaluated. Right Ventricle: The right ventricular size is normal. No increase in right ventricular wall thickness. Right ventricular systolic function is normal. There is normal pulmonary artery systolic pressure. The tricuspid regurgitant velocity is 1.94 m/s, and  with an assumed right atrial pressure of 3 mmHg, the estimated right ventricular systolic pressure is 95.6 mmHg. Left Atrium: Left atrial size was normal in size. Right Atrium: Right atrial size was normal in size. Pericardium: Trivial pericardial effusion is present. The pericardial effusion is anterior to the right ventricle. Mitral Valve: The mitral valve is abnormal. There is mild calcification of the mitral valve leaflet(s). Trivial mitral valve regurgitation. MV peak gradient, 6.6 mmHg. The mean mitral valve gradient is 2.0 mmHg. Tricuspid Valve: The tricuspid valve is grossly normal. Tricuspid valve regurgitation is trivial. Aortic Valve: The aortic valve is tricuspid. Aortic valve regurgitation is not visualized. Aortic valve mean gradient measures 3.5 mmHg. Aortic valve peak gradient measures 6.8 mmHg. Aortic valve area, by VTI measures 2.28 cm. Pulmonic Valve: The pulmonic valve was grossly normal. Pulmonic valve regurgitation is not visualized. Aorta: The aortic root and ascending aorta are structurally normal, with no evidence of dilitation. Venous: The inferior vena cava is normal in size with greater than 50% respiratory variability, suggesting right atrial pressure of 3 mmHg. IAS/Shunts: No atrial level shunt detected by color flow Doppler.  LEFT VENTRICLE PLAX 2D LVIDd:         5.60 cm     Diastology LVIDs:         3.20 cm     LV e' medial:   7.94 cm/s LV PW:         0.90 cm     LV E/e' medial: 13.4 LV IVS:        0.80 cm LVOT diam:     2.00 cm LV SV:         63 LV SV Index:   29 LVOT Area:     3.14 cm  LV Volumes (MOD)  LV vol d, MOD A2C: 82.3 ml LV vol d, MOD A4C: 75.0 ml LV vol s, MOD A2C: 30.2 ml LV vol s, MOD A4C: 44.9 ml LV SV MOD A2C:     52.1 ml LV SV MOD A4C:     75.0 ml LV SV MOD  BP:      42.3 ml RIGHT VENTRICLE RV Basal diam:  3.05 cm RV Mid diam:    3.00 cm RV S prime:     12.20 cm/s TAPSE (M-mode): 2.0 cm LEFT ATRIUM             Index        RIGHT ATRIUM           Index LA diam:        4.40 cm 1.99 cm/m   RA Area:     18.50 cm LA Vol (A2C):   49.9 ml 22.56 ml/m  RA Volume:   48.60 ml  21.98 ml/m LA Vol (A4C):   61.4 ml 27.76 ml/m LA Biplane Vol: 59.1 ml 26.72 ml/m  AORTIC VALVE                    PULMONIC VALVE AV Area (Vmax):    2.03 cm     PV Vmax:       0.73 m/s AV Area (Vmean):   2.27 cm     PV Peak grad:  2.1 mmHg AV Area (VTI):     2.28 cm AV Vmax:           130.50 cm/s AV Vmean:          83.700 cm/s AV VTI:            0.276 m AV Peak Grad:      6.8 mmHg AV Mean Grad:      3.5 mmHg LVOT Vmax:         84.20 cm/s LVOT Vmean:        60.400 cm/s LVOT VTI:          0.201 m LVOT/AV VTI ratio: 0.73  AORTA Ao Root diam: 3.00 cm MITRAL VALVE                TRICUSPID VALVE MV Area (PHT): 4.21 cm     TR Peak grad:   15.1 mmHg MV Area VTI:   2.33 cm     TR Vmax:        194.00 cm/s MV Peak grad:  6.6 mmHg MV Mean grad:  2.0 mmHg     SHUNTS MV Vmax:       1.28 m/s     Systemic VTI:  0.20 m MV Vmean:      62.4 cm/s    Systemic Diam: 2.00 cm MV Decel Time: 180 msec MV E velocity: 106.00 cm/s MV A velocity: 52.30 cm/s MV E/A ratio:  2.03 Lyman Bishop MD Electronically signed by Lyman Bishop MD Signature Date/Time: 04/23/2021/4:39:52 PM    Final      Scheduled Meds:  citalopram  20 mg Oral Daily   fluticasone furoate-vilanterol  1 puff Inhalation Daily   furosemide  40 mg Intravenous BID   metolazone  2.5 mg Oral Daily   metoprolol tartrate  25 mg Oral BID WC   pantoprazole  40 mg Oral Daily   rivaroxaban  20 mg Oral Q breakfast   sodium chloride flush  3 mL Intravenous Q12H   Continuous Infusions:   sodium chloride       LOS: 1 day    Barton Dubois, MD [facility]   To contact the attending provider between 7A-7P or the covering provider during after hours 7P-7A, please log into the web site [email or web address] and access using universal [place] password for that web site. If you do not have the password,  please call the hospital operator.  04/23/2021, 4:48 PM

## 2021-04-24 DIAGNOSIS — J9601 Acute respiratory failure with hypoxia: Secondary | ICD-10-CM | POA: Diagnosis not present

## 2021-04-24 DIAGNOSIS — I4891 Unspecified atrial fibrillation: Secondary | ICD-10-CM

## 2021-04-24 DIAGNOSIS — I5031 Acute diastolic (congestive) heart failure: Secondary | ICD-10-CM | POA: Diagnosis not present

## 2021-04-24 LAB — BASIC METABOLIC PANEL
Anion gap: 8 (ref 5–15)
BUN: 10 mg/dL (ref 8–23)
CO2: 32 mmol/L (ref 22–32)
Calcium: 8.5 mg/dL — ABNORMAL LOW (ref 8.9–10.3)
Chloride: 97 mmol/L — ABNORMAL LOW (ref 98–111)
Creatinine, Ser: 0.7 mg/dL (ref 0.44–1.00)
GFR, Estimated: >60 mL/min (ref >60)
Glucose, Bld: 96 mg/dL (ref 70–99)
Potassium: 3.5 mmol/L (ref 3.5–5.1)
Sodium: 137 mmol/L (ref 135–145)

## 2021-04-24 MED ORDER — LIVING BETTER WITH HEART FAILURE BOOK: Freq: Once | Status: AC

## 2021-04-24 NOTE — Progress Notes (Signed)
Patient out of bed to bedside chair. Complained of slight headache, states it has been going on off and on. Breathing is reportedly better. Call bell within reach.

## 2021-04-24 NOTE — Progress Notes (Signed)
PROGRESS NOTE  Brooke Luna PJK:[id number] DOB: Jul 13, 1945 DOA: 04/22/2021 PCP: [name], MD  Brief History:  75 y.o. female with medical history significant of COPD, obesity, depression/anxiety, hypertension, atrial fibrillation on chronic anticoagulation (Xarelto) and GERD; who presented to the emergency department secondary to shortness of breath, dyspnea exertion, hypoxia and increased lower extremity swelling.  Patient reports symptom has been present for the last 1-2 weeks and worsening.  Patient was previously exterminated for similar complaints in the past (month ago) and at that time and work-up suggested a component of obesity hypoventilation syndrome.  Patient reports that she has not lost any significant weight since then and has recently noticed on top of this dyspnea on exertion also development of shortness of breath while at rest, difficulty laying down flat, increased lower extremity swelling and also hypoxia.  On the day of admission at the time the EMS arrived to her house patient was found with O2 sats in the mid 70s.   Patient is vaccinated against COVID (no booster); COVID PCR in the ED negative at time of admission.   ED Course: Chest x-ray demonstrating cardiomegaly and vascular congestion; elevated BNP and requirement of 3 L nasal cannula supplementation to improve hypoxia.  The rest of her vital signs and blood work unremarkable.  TRH has been called to place in the hospital for further evaluation and management of acute respiratory failure with hypoxia in the setting of CHF exacerbation.  Assessment/Plan: acute respiratory failure with hypoxia -In the setting of acute on chronic diastolic heart failure and obesity hypoventilation syndrome -Continue IV lasix -Continue to follow strict I's and O's, daily weights and low-sodium diet. -Continue to wean off oxygen supplementation as tolerated -2D echo demonstrating no wall motion abnormalities and preserved  ejection fraction. -stable on 3L   COPD -No wheezing and able to speak in full sentences. -Continue Breo   gastroesophageal reflux disease -Continue PPI.   paroxysmal atrial fibrillation -Currently in sinus rhythm -Continue telemetry monitoring -Continue metoprolol and Xarelto.   depression/anxiety -Patient reports experiencing anxiety attacks especially at nighttime -Will continue the use of Celexa and adjust her Klonopin dose for better mood control.   morbid obesity -Body mass index is 43.27 kg/m. -Low calorie diet, portion control and increase physical activity has been discussed with patient.   hypertension -Stable vital signs -Continue heart healthy diet -Continue current antihypertensive regimen.           Family Communication:   spouse updated at bedside 12/28  Consultants:  none  Code Status:  FULL   DVT Prophylaxis:  Xarelto   Procedures: As Listed in Progress Note Above  Antibiotics: None       Subjective: She is breathing better, but continues to have sob with exertion.  Denies f/c, cp, n/v/d, abd pain.  Objective: Vitals:   04/23/21 1325 04/23/21 2044 04/24/21 0615 04/24/21 1335  BP: 125/60 131/61 (!) 142/78 132/62  Pulse: 71 72 74 79  Resp: 18 20 18 18   Temp: 98.2 F (36.8 C) 98.6 F (37 C) 97.7 F (36.5 C) 98.2 F (36.8 C)  TempSrc: Oral Oral Oral Oral  SpO2: 95% 96% 95% 95%  Weight:   121.2 kg   Height:   5\' 5"  (1.651 m)     Intake/Output Summary (Last 24 hours) at 04/24/2021 1816 Last data filed at 04/24/2021 1700 Gross per 24 hour  Intake 356 ml  Output 1400 ml  Net -1044 ml  Weight change: 3.265 kg Exam:  General:  Pt is alert, follows commands appropriately, not in acute distress HEENT: No icterus, No thrush, No neck mass, [place]/AT Cardiovascular: RRR, S1/S2, no rubs, no gallops Respiratory: bibasilar crackles; no wheeze Abdomen: Soft/+BS, non tender, non distended, no guarding Extremities: 1 + LE edema, No  lymphangitis, No petechiae, No rashes, no synovitis   Data Reviewed: I have personally reviewed following labs and imaging studies Basic Metabolic Panel: Recent Labs  Lab 04/22/21 1133 04/22/21 1515 04/23/21 0552 04/24/21 0555  NA 134*  --  134* 137  K 4.0  --  3.6 3.5  CL 99  --  98 97*  CO2 24  --  26 32  GLUCOSE 135*  --  130* 96  BUN 9  --  9 10  CREATININE 0.73  --  0.68 0.70  CALCIUM 8.7*  --  8.4* 8.5*  MG  --  2.1  --   --   PHOS  --  3.4  --   --    Liver Function Tests: Recent Labs  Lab 04/22/21 1133  AST 21  ALT 18  ALKPHOS 73  BILITOT 2.3*  PROT 7.3  ALBUMIN 4.0   No results for input(s): LIPASE, AMYLASE in the last 168 hours. No results for input(s): AMMONIA in the last 168 hours. Coagulation Profile: No results for input(s): INR, PROTIME in the last 168 hours. CBC: Recent Labs  Lab 04/22/21 1133  WBC 14.0*  NEUTROABS 11.9*  HGB 13.4  HCT 42.0  MCV 90.9  PLT 368   Cardiac Enzymes: No results for input(s): CKTOTAL, CKMB, CKMBINDEX, TROPONINI in the last 168 hours. BNP: Invalid input(s): POCBNP CBG: No results for input(s): GLUCAP in the last 168 hours. HbA1C: No results for input(s): HGBA1C in the last 72 hours. Urine analysis:    Component Value Date/Time   COLORURINE YELLOW 10/23/2011 [place] 10/23/2011 1340   LABSPEC 1.021 10/23/2011 1340   PHURINE 6.0 10/23/2011 1340   GLUCOSEU NEGATIVE 10/23/2011 1340   HGBUR NEGATIVE 10/23/2011 1340   BILIRUBINUR NEGATIVE 10/23/2011 1340   KETONESUR NEGATIVE 10/23/2011 1340   PROTEINUR NEGATIVE 10/23/2011 1340   UROBILINOGEN 0.2 10/23/2011 1340   NITRITE NEGATIVE 10/23/2011 1340   LEUKOCYTESUR NEGATIVE 10/23/2011 1340   Sepsis Labs: @LABRCNTIP (procalcitonin:4,lacticidven:4) ) Recent Results (from the past 240 hour(s))  Resp Panel by RT-PCR (Flu A&B, Covid) Nasopharyngeal Swab     Status: None   Collection Time: 04/22/21 12:11 PM   Specimen: Nasopharyngeal Swab;  Nasopharyngeal(NP) swabs in vial transport medium  Result Value Ref Range Status   SARS Coronavirus 2 by RT PCR NEGATIVE NEGATIVE Final    Comment: (NOTE) SARS-CoV-2 target nucleic acids are NOT DETECTED.  The SARS-CoV-2 RNA is generally detectable in upper respiratory specimens during the acute phase of infection. The lowest concentration of SARS-CoV-2 viral copies this assay can detect is 138 copies/mL. A negative result does not preclude SARS-Cov-2 infection and should not be used as the sole basis for treatment or other patient management decisions. A negative result may occur with  improper specimen collection/handling, submission of specimen other than nasopharyngeal swab, presence of viral mutation(s) within the areas targeted by this assay, and inadequate number of viral copies(<138 copies/mL). A negative result must be combined with clinical observations, patient history, and epidemiological information. The expected result is Negative.  Fact Sheet for Patients:  EntrepreneurPulse.com.au  Fact Sheet for Healthcare Providers:  IncredibleEmployment.be  This test is no t yet approved or  cleared by the [place] and  has been authorized for detection and/or diagnosis of SARS-CoV-2 by FDA under an Emergency Use Authorization (EUA). This EUA will remain  in effect (meaning this test can be used) for the duration of the COVID-19 declaration under Section 564(b)(1) of the Act, 21 U.S.C.section 360bbb-3(b)(1), unless the authorization is terminated  or revoked sooner.       Influenza A by PCR NEGATIVE NEGATIVE Final   Influenza B by PCR NEGATIVE NEGATIVE Final    Comment: (NOTE) The Xpert Xpress SARS-CoV-2/FLU/RSV plus assay is intended as an aid in the diagnosis of influenza from Nasopharyngeal swab specimens and should not be used as a sole basis for treatment. Nasal washings and aspirates are unacceptable for Xpert Xpress  SARS-CoV-2/FLU/RSV testing.  Fact Sheet for Patients: EntrepreneurPulse.com.au  Fact Sheet for Healthcare Providers: IncredibleEmployment.be  This test is not yet approved or cleared by the [place] FDA and has been authorized for detection and/or diagnosis of SARS-CoV-2 by FDA under an Emergency Use Authorization (EUA). This EUA will remain in effect (meaning this test can be used) for the duration of the COVID-19 declaration under Section 564(b)(1) of the Act, 21 U.S.C. section 360bbb-3(b)(1), unless the authorization is terminated or revoked.  Performed at [facility], [address] [place], [place] [address]      Scheduled Meds:  citalopram  20 mg Oral Daily   fluticasone furoate-vilanterol  1 puff Inhalation Daily   furosemide  40 mg Intravenous BID   metoprolol tartrate  25 mg Oral BID WC   pantoprazole  40 mg Oral Daily   rivaroxaban  20 mg Oral Q breakfast   sodium chloride flush  3 mL Intravenous Q12H   Continuous Infusions:  sodium chloride      Procedures/Studies: DG Chest 2 View  Result Date: 04/22/2021 CLINICAL DATA:  Dyspnea, shortness of breath, hypoxemia, history atrial fibrillation, asthma EXAM: CHEST - 2 VIEW COMPARISON:  07/17/2020 FINDINGS: Enlargement of cardiac silhouette with pulmonary vascular congestion. Atherosclerotic calcification aorta. Interstitial infiltrates likely pulmonary edema and CHF. Tiny bibasilar effusions and minimal atelectasis. No pneumothorax. Bones demineralized. IMPRESSION: CHF. Aortic Atherosclerosis (ICD10-I70.0). Electronically Signed   By: Lavonia Dana M.D.   On: 04/22/2021 12:00   ECHOCARDIOGRAM COMPLETE  Result Date: 04/23/2021    ECHOCARDIOGRAM REPORT   Patient Name:   WILMETTA SPEISER Date of Exam: 04/23/2021 Medical Rec #:  [id number]       Height:       65.0 in Accession #:    [id number]      Weight:       260.0 lb Date of Birth:  [birth year]       BSA:          2.211 m Patient  Age:    26 years        BP:           172/74 mmHg Patient Gender: F               HR:           79 bpm. Exam Location:  [facility] Procedure: 2D Echo, Cardiac Doppler and Color Doppler Indications:    Congestive Heart Failure  History:        Patient has prior history of Echocardiogram examinations, most                 recent 10/25/2019. CAD, Arrythmias:Atrial Fibrillation,  Signs/Symptoms:Dyspnea; Risk Factors:Former Smoker. Ablation.  Sonographer:    Wenda Low Referring Phys: Owen  1. Left ventricular ejection fraction, by estimation, is 60 to 65%. The left ventricle has normal function. The left ventricle has no regional wall motion abnormalities. Left ventricular diastolic function could not be evaluated.  2. Right ventricular systolic function is normal. The right ventricular size is normal. There is normal pulmonary artery systolic pressure. The estimated right ventricular systolic pressure is 36.6 mmHg.  3. The trivial pericardial effusion is anterior to the right ventricle.  4. The mitral valve is abnormal. Trivial mitral valve regurgitation.  5. The aortic valve is tricuspid. Aortic valve regurgitation is not visualized.  6. The inferior vena cava is normal in size with greater than 50% respiratory variability, suggesting right atrial pressure of 3 mmHg. Comparison(s): Changes from prior study are noted. 10/25/2019: LVEF 60-65%. FINDINGS  Left Ventricle: Left ventricular ejection fraction, by estimation, is 60 to 65%. The left ventricle has normal function. The left ventricle has no regional wall motion abnormalities. The left ventricular internal cavity size was normal in size. There is  no left ventricular hypertrophy. Left ventricular diastolic function could not be evaluated due to atrial fibrillation. Left ventricular diastolic function could not be evaluated. Right Ventricle: The right ventricular size is normal. No increase in right ventricular wall  thickness. Right ventricular systolic function is normal. There is normal pulmonary artery systolic pressure. The tricuspid regurgitant velocity is 1.94 m/s, and  with an assumed right atrial pressure of 3 mmHg, the estimated right ventricular systolic pressure is 29.4 mmHg. Left Atrium: Left atrial size was normal in size. Right Atrium: Right atrial size was normal in size. Pericardium: Trivial pericardial effusion is present. The pericardial effusion is anterior to the right ventricle. Mitral Valve: The mitral valve is abnormal. There is mild calcification of the mitral valve leaflet(s). Trivial mitral valve regurgitation. MV peak gradient, 6.6 mmHg. The mean mitral valve gradient is 2.0 mmHg. Tricuspid Valve: The tricuspid valve is grossly normal. Tricuspid valve regurgitation is trivial. Aortic Valve: The aortic valve is tricuspid. Aortic valve regurgitation is not visualized. Aortic valve mean gradient measures 3.5 mmHg. Aortic valve peak gradient measures 6.8 mmHg. Aortic valve area, by VTI measures 2.28 cm. Pulmonic Valve: The pulmonic valve was grossly normal. Pulmonic valve regurgitation is not visualized. Aorta: The aortic root and ascending aorta are structurally normal, with no evidence of dilitation. Venous: The inferior vena cava is normal in size with greater than 50% respiratory variability, suggesting right atrial pressure of 3 mmHg. IAS/Shunts: No atrial level shunt detected by color flow Doppler.  LEFT VENTRICLE PLAX 2D LVIDd:         5.60 cm     Diastology LVIDs:         3.20 cm     LV e' medial:   7.94 cm/s LV PW:         0.90 cm     LV E/e' medial: 13.4 LV IVS:        0.80 cm LVOT diam:     2.00 cm LV SV:         63 LV SV Index:   29 LVOT Area:     3.14 cm  LV Volumes (MOD) LV vol d, MOD A2C: 82.3 ml LV vol d, MOD A4C: 75.0 ml LV vol s, MOD A2C: 30.2 ml LV vol s, MOD A4C: 44.9 ml LV SV MOD A2C:     52.1 ml LV SV  MOD A4C:     75.0 ml LV SV MOD BP:      42.3 ml RIGHT VENTRICLE RV Basal diam:   3.05 cm RV Mid diam:    3.00 cm RV S prime:     12.20 cm/s TAPSE (M-mode): 2.0 cm LEFT ATRIUM             Index        RIGHT ATRIUM           Index LA diam:        4.40 cm 1.99 cm/m   RA Area:     18.50 cm LA Vol (A2C):   49.9 ml 22.56 ml/m  RA Volume:   48.60 ml  21.98 ml/m LA Vol (A4C):   61.4 ml 27.76 ml/m LA Biplane Vol: 59.1 ml 26.72 ml/m  AORTIC VALVE                    PULMONIC VALVE AV Area (Vmax):    2.03 cm     PV Vmax:       0.73 m/s AV Area (Vmean):   2.27 cm     PV Peak grad:  2.1 mmHg AV Area (VTI):     2.28 cm AV Vmax:           130.50 cm/s AV Vmean:          83.700 cm/s AV VTI:            0.276 m AV Peak Grad:      6.8 mmHg AV Mean Grad:      3.5 mmHg LVOT Vmax:         84.20 cm/s LVOT Vmean:        60.400 cm/s LVOT VTI:          0.201 m LVOT/AV VTI ratio: 0.73  AORTA Ao Root diam: 3.00 cm MITRAL VALVE                TRICUSPID VALVE MV Area (PHT): 4.21 cm     TR Peak grad:   15.1 mmHg MV Area VTI:   2.33 cm     TR Vmax:        194.00 cm/s MV Peak grad:  6.6 mmHg MV Mean grad:  2.0 mmHg     SHUNTS MV Vmax:       1.28 m/s     Systemic VTI:  0.20 m MV Vmean:      62.4 cm/s    Systemic Diam: 2.00 cm MV Decel Time: 180 msec MV E velocity: 106.00 cm/s MV A velocity: 52.30 cm/s MV E/A ratio:  2.03 Lyman Bishop MD Electronically signed by Lyman Bishop MD Signature Date/Time: 04/23/2021/4:39:52 PM    Final     Orson Eva, DO  [facility]  If 7PM-7AM, please contact night-coverage [email or web address] Password [email or web address] 04/24/2021, 6:16 PM   LOS: 2 days

## 2021-04-24 NOTE — Progress Notes (Signed)
Patient is ambulatory now with her o2. Has been going to the bathroom without assist. Using her hat. Educated her again on heart failure, and went over heart failure book with both her and her husband. They are both pleasant and state they understand.

## 2021-04-24 NOTE — TOC Initial Note (Signed)
Transition of Care [facility]) - Initial/Assessment Note    Patient Details  Name: Brooke Luna MRN: [id number] Date of Birth: 1945-09-16  [facility]) CM/SW Contact:    Boneta Lucks, RN Phone Number: 04/24/2021, 2:28 PM  Clinical Narrative:      Patient admitted with acute respiratory failure with hypoxia. Patient lives at home with her husband. TOC consulted for CHF. Patient has not had an discussion with anyone about CHF, daily weights, fluid restrictions, or diet. TOC offered Home health RN.  Patient states they have a dog that will bite,so they do not allow visiting. TOC will order Living better book for patient to review for questions.              Expected Discharge Plan: Home/Self Care Barriers to Discharge: Continued Medical Work up  Patient Goals and CMS Choice Patient states their goals for this hospitalization and ongoing recovery are:: to get better CMS Medicare.gov Compare Post Acute Care list provided to:: Patient Choice offered to / list presented to : Patient  Expected Discharge Plan and Services Expected Discharge Plan: Home/Self Care   Discharge Planning Services: CM Consult   Living arrangements for the past 2 months: Single Family Home       Prior Living Arrangements/Services Living arrangements for the past 2 months: Single Family Home Lives with:: Spouse Patient language and need for interpreter reviewed:: Yes        Need for Family Participation in Patient Care: Yes (Comment) Care giver support system in place?: Yes (comment)   Criminal Activity/Legal Involvement Pertinent to Current Situation/Hospitalization: No - Comment as needed  Activities of Daily Living Home Assistive Devices/Equipment: Other (Comment), Shower chair without back, Dentures (specify type) (Cane) ADL Screening (condition at time of admission) Patient's cognitive ability adequate to safely complete daily activities?: Yes Is the patient deaf or have difficulty  hearing?: No Does the patient have difficulty seeing, even when wearing glasses/contacts?: No Does the patient have difficulty concentrating, remembering, or making decisions?: No Patient able to express need for assistance with ADLs?: Yes Does the patient have difficulty dressing or bathing?: No Independently performs ADLs?: Yes (appropriate for developmental age) Does the patient have difficulty walking or climbing stairs?: Yes Weakness of Legs: Both Weakness of Arms/Hands: None  Permission Sought/Granted     Emotional Assessment     Affect (typically observed): Accepting Orientation: : Oriented to Self, Oriented to Place, Oriented to  Time, Oriented to Situation Alcohol / Substance Use: Not Applicable Psych Involvement: No (comment)  Admission diagnosis:  Hypoxia [R09.02] Acute respiratory failure with hypoxia (HCC) [J96.01] Acute congestive heart failure, unspecified heart failure type [facility]) [I50.9] Patient Active Problem List   Diagnosis Date Noted   Acute respiratory failure with hypoxia ([place]) 04/22/2021   DOE (dyspnea on exertion) 07/30/2020   Morbid obesity due to excess calories ([place]) 07/30/2020   Atrial fibrillation with RVR ([place]) 12/16/2019   Atrial fibrillation ([place])    Varicose veins of lower extremities with other complications 68/02/5725   Chronic venous insufficiency 12/17/2012   Postop Hyponatremia 11/05/2011   OA (osteoarthritis) of knee 11/03/2011   PCP:  [name], MD Pharmacy:   [id number] - [place], [place] [address] [place] [address] Phone: [phone] Fax: [phone]   Readmission Risk Interventions Readmission Risk Prevention Plan 04/24/2021  Medication Screening Complete  Transportation Screening Complete  Some recent data might be hidden

## 2021-04-25 DIAGNOSIS — J9601 Acute respiratory failure with hypoxia: Secondary | ICD-10-CM | POA: Diagnosis not present

## 2021-04-25 DIAGNOSIS — I4891 Unspecified atrial fibrillation: Secondary | ICD-10-CM | POA: Diagnosis not present

## 2021-04-25 DIAGNOSIS — I5031 Acute diastolic (congestive) heart failure: Secondary | ICD-10-CM | POA: Diagnosis not present

## 2021-04-25 LAB — BASIC METABOLIC PANEL
Anion gap: 12 (ref 5–15)
BUN: 17 mg/dL (ref 8–23)
CO2: 37 mmol/L — ABNORMAL HIGH (ref 22–32)
Calcium: 9.2 mg/dL (ref 8.9–10.3)
Chloride: 89 mmol/L — ABNORMAL LOW (ref 98–111)
Creatinine, Ser: 0.97 mg/dL (ref 0.44–1.00)
GFR, Estimated: >60 mL/min (ref >60)
Glucose, Bld: 113 mg/dL — ABNORMAL HIGH (ref 70–99)
Potassium: 3.2 mmol/L — ABNORMAL LOW (ref 3.5–5.1)
Sodium: 138 mmol/L (ref 135–145)

## 2021-04-25 LAB — MAGNESIUM: Magnesium: 2 mg/dL (ref 1.7–2.4)

## 2021-04-25 MED ORDER — POTASSIUM CHLORIDE CRYS ER 20 MEQ PO TBCR
40.0000 meq | EXTENDED_RELEASE_TABLET | Freq: Once | ORAL | Status: AC
  Administered 2021-04-25: 17:00:00 40 meq via ORAL
  Filled 2021-04-25: qty 2

## 2021-04-25 NOTE — Plan of Care (Signed)

## 2021-04-25 NOTE — Care Management Important Message (Signed)
Important Message  Patient Details  Name: Brooke Luna MRN: [id number] Date of Birth: 01/21/1946   Medicare Important Message Given:  Yes  Reviewed Medicare IM with patient via room phone.  States she has a copy to reference in her room so does not need another copy at this time.     Dannette Mady 04/25/2021, 12:11 PM

## 2021-04-25 NOTE — Progress Notes (Addendum)
PROGRESS NOTE  Brooke Luna JME:[id number] DOB: October 21, 1945 DOA: 04/22/2021 PCP: [name], MD   Brief History:  75 y.o. female with medical history significant of COPD, obesity, depression/anxiety, hypertension, atrial fibrillation on chronic anticoagulation (Xarelto) and GERD; who presented to the emergency department secondary to shortness of breath, dyspnea exertion, hypoxia and increased lower extremity swelling.  Patient reports symptom has been present for the last 1-2 weeks and worsening.  Patient was previously exterminated for similar complaints in the past (month ago) and at that time and work-up suggested a component of obesity hypoventilation syndrome.  Patient reports that she has not lost any significant weight since then and has recently noticed on top of this dyspnea on exertion also development of shortness of breath while at rest, difficulty laying down flat, increased lower extremity swelling and also hypoxia.  On the day of admission at the time the EMS arrived to her house patient was found with O2 sats in the mid 70s.   Patient is vaccinated against COVID (no booster); COVID PCR in the ED negative at time of admission.   ED Course: Chest x-ray demonstrating cardiomegaly and vascular congestion; elevated BNP and requirement of 3 L nasal cannula supplementation to improve hypoxia.  The rest of her vital signs and blood work unremarkable.  TRH has been called to place in the hospital for further evaluation and management of acute respiratory failure with hypoxia in the setting of CHF exacerbation.   Assessment/Plan: acute respiratory failure with hypoxia -In the setting of acute on chronic diastolic heart failure and obesity hypoventilation syndrome -Continue IV lasix -Continue to follow strict I's and O's, daily weights and low-sodium diet. -Continue to wean off oxygen supplementation as tolerated -2D echo demonstrating no wall motion abnormalities and  preserved ejection fraction. -stable on 3L>>1L   COPD -No wheezing and able to speak in full sentences. -Continue Breo   gastroesophageal reflux disease -Continue PPI.   paroxysmal atrial fibrillation -Currently in sinus rhythm -Continue telemetry monitoring -Continue metoprolol and Xarelto.   depression/anxiety -Patient reports experiencing anxiety attacks especially at nighttime -continue Celexa and adjust her Klonopin dose for better mood control.   morbid obesity -Body mass index is 43.27 kg/m. -Low calorie diet, portion control and increase physical activity has been discussed with patient.   hypertension -well controlled -Continue heart healthy diet -Continue metoprolol   Hypokalemia -replete -mag 2.0               Family Communication:   spouse updated at bedside 12/28   Consultants:  none   Code Status:  FULL    DVT Prophylaxis:  Xarelto     Procedures: As Listed in Progress Note Above   Antibiotics: None       Subjective: Pt is breathing better.  She still has some dyspnea on exertion.  Denies f/c, cp, n/v/d, abd pain, dysuria  Objective: Vitals:   04/24/21 2223 04/25/21 0547 04/25/21 0839 04/25/21 1354  BP: 133/61 128/72  (!) 106/54  Pulse: 77 74  82  Resp: 20 18  18   Temp: 98.2 F (36.8 C) 97.9 F (36.6 C)  98 F (36.7 C)  TempSrc:  Oral    SpO2: 97% 96% 96% 90%  Weight:  118.6 kg    Height:  5\' 5"  (1.651 m)      Intake/Output Summary (Last 24 hours) at 04/25/2021 1642 Last data filed at 04/25/2021 0800 Gross per 24 hour  Intake --  Output 1700 ml  Net -1700 ml   Weight change: -2.6 kg Exam:  General:  Pt is alert, follows commands appropriately, not in acute distress HEENT: No icterus, No thrush, No neck mass, [place]/AT Cardiovascular: RRR, S1/S2, no rubs, no gallops Respiratory: bibasilar crackles. No wheeze Abdomen: Soft/+BS, non tender, non distended, no guarding Extremities: No edema, No lymphangitis, No  petechiae, No rashes, no synovitis   Data Reviewed: I have personally reviewed following labs and imaging studies Basic Metabolic Panel: Recent Labs  Lab 04/22/21 1133 04/22/21 1515 04/23/21 0552 04/24/21 0555 04/25/21 0611  NA 134*  --  134* 137 138  K 4.0  --  3.6 3.5 3.2*  CL 99  --  98 97* 89*  CO2 24  --  26 32 37*  GLUCOSE 135*  --  130* 96 113*  BUN 9  --  9 10 17   CREATININE 0.73  --  0.68 0.70 0.97  CALCIUM 8.7*  --  8.4* 8.5* 9.2  MG  --  2.1  --   --  2.0  PHOS  --  3.4  --   --   --    Liver Function Tests: Recent Labs  Lab 04/22/21 1133  AST 21  ALT 18  ALKPHOS 73  BILITOT 2.3*  PROT 7.3  ALBUMIN 4.0   No results for input(s): LIPASE, AMYLASE in the last 168 hours. No results for input(s): AMMONIA in the last 168 hours. Coagulation Profile: No results for input(s): INR, PROTIME in the last 168 hours. CBC: Recent Labs  Lab 04/22/21 1133  WBC 14.0*  NEUTROABS 11.9*  HGB 13.4  HCT 42.0  MCV 90.9  PLT 368   Cardiac Enzymes: No results for input(s): CKTOTAL, CKMB, CKMBINDEX, TROPONINI in the last 168 hours. BNP: Invalid input(s): POCBNP CBG: No results for input(s): GLUCAP in the last 168 hours. HbA1C: No results for input(s): HGBA1C in the last 72 hours. Urine analysis:    Component Value Date/Time   COLORURINE YELLOW 10/23/2011 [place] 10/23/2011 1340   LABSPEC 1.021 10/23/2011 1340   PHURINE 6.0 10/23/2011 1340   GLUCOSEU NEGATIVE 10/23/2011 1340   HGBUR NEGATIVE 10/23/2011 1340   BILIRUBINUR NEGATIVE 10/23/2011 1340   KETONESUR NEGATIVE 10/23/2011 1340   PROTEINUR NEGATIVE 10/23/2011 1340   UROBILINOGEN 0.2 10/23/2011 1340   NITRITE NEGATIVE 10/23/2011 1340   LEUKOCYTESUR NEGATIVE 10/23/2011 1340   Sepsis Labs: @LABRCNTIP (procalcitonin:4,lacticidven:4) ) Recent Results (from the past 240 hour(s))  Resp Panel by RT-PCR (Flu A&B, Covid) Nasopharyngeal Swab     Status: None   Collection Time: 04/22/21 12:11 PM    Specimen: Nasopharyngeal Swab; Nasopharyngeal(NP) swabs in vial transport medium  Result Value Ref Range Status   SARS Coronavirus 2 by RT PCR NEGATIVE NEGATIVE Final    Comment: (NOTE) SARS-CoV-2 target nucleic acids are NOT DETECTED.  The SARS-CoV-2 RNA is generally detectable in upper respiratory specimens during the acute phase of infection. The lowest concentration of SARS-CoV-2 viral copies this assay can detect is 138 copies/mL. A negative result does not preclude SARS-Cov-2 infection and should not be used as the sole basis for treatment or other patient management decisions. A negative result may occur with  improper specimen collection/handling, submission of specimen other than nasopharyngeal swab, presence of viral mutation(s) within the areas targeted by this assay, and inadequate number of viral copies(<138 copies/mL). A negative result must be combined with clinical observations, patient history, and epidemiological information. The expected result is Negative.  Fact Sheet  for Patients:  EntrepreneurPulse.com.au  Fact Sheet for Healthcare Providers:  IncredibleEmployment.be  This test is no t yet approved or cleared by the [place] FDA and  has been authorized for detection and/or diagnosis of SARS-CoV-2 by FDA under an Emergency Use Authorization (EUA). This EUA will remain  in effect (meaning this test can be used) for the duration of the COVID-19 declaration under Section 564(b)(1) of the Act, 21 U.S.C.section 360bbb-3(b)(1), unless the authorization is terminated  or revoked sooner.       Influenza A by PCR NEGATIVE NEGATIVE Final   Influenza B by PCR NEGATIVE NEGATIVE Final    Comment: (NOTE) The Xpert Xpress SARS-CoV-2/FLU/RSV plus assay is intended as an aid in the diagnosis of influenza from Nasopharyngeal swab specimens and should not be used as a sole basis for treatment. Nasal washings and aspirates are  unacceptable for Xpert Xpress SARS-CoV-2/FLU/RSV testing.  Fact Sheet for Patients: EntrepreneurPulse.com.au  Fact Sheet for Healthcare Providers: IncredibleEmployment.be  This test is not yet approved or cleared by the [place] FDA and has been authorized for detection and/or diagnosis of SARS-CoV-2 by FDA under an Emergency Use Authorization (EUA). This EUA will remain in effect (meaning this test can be used) for the duration of the COVID-19 declaration under Section 564(b)(1) of the Act, 21 U.S.C. section 360bbb-3(b)(1), unless the authorization is terminated or revoked.  Performed at [facility], [address], [place], [place] [address]      Scheduled Meds:  citalopram  20 mg Oral Daily   fluticasone furoate-vilanterol  1 puff Inhalation Daily   furosemide  40 mg Intravenous BID   metoprolol tartrate  25 mg Oral BID WC   pantoprazole  40 mg Oral Daily   potassium chloride  40 mEq Oral Once   rivaroxaban  20 mg Oral Q breakfast   sodium chloride flush  3 mL Intravenous Q12H   Continuous Infusions:  sodium chloride      Procedures/Studies: DG Chest 2 View  Result Date: 04/22/2021 CLINICAL DATA:  Dyspnea, shortness of breath, hypoxemia, history atrial fibrillation, asthma EXAM: CHEST - 2 VIEW COMPARISON:  07/17/2020 FINDINGS: Enlargement of cardiac silhouette with pulmonary vascular congestion. Atherosclerotic calcification aorta. Interstitial infiltrates likely pulmonary edema and CHF. Tiny bibasilar effusions and minimal atelectasis. No pneumothorax. Bones demineralized. IMPRESSION: CHF. Aortic Atherosclerosis (ICD10-I70.0). Electronically Signed   By: Lavonia Dana M.D.   On: 04/22/2021 12:00   ECHOCARDIOGRAM COMPLETE  Result Date: 04/23/2021    ECHOCARDIOGRAM REPORT   Patient Name:   ALEZA PEW Date of Exam: 04/23/2021 Medical Rec #:  [id number]       Height:       65.0 in Accession #:    [id number]      Weight:       260.0  lb Date of Birth:  1946-03-25       BSA:          2.211 m Patient Age:    64 years        BP:           172/74 mmHg Patient Gender: F               HR:           79 bpm. Exam Location:  [facility] Procedure: 2D Echo, Cardiac Doppler and Color Doppler Indications:    Congestive Heart Failure  History:        Patient has prior history of Echocardiogram examinations, most  recent 10/25/2019. CAD, Arrythmias:Atrial Fibrillation,                 Signs/Symptoms:Dyspnea; Risk Factors:Former Smoker. Ablation.  Sonographer:    Wenda Low Referring Phys: [place]  1. Left ventricular ejection fraction, by estimation, is 60 to 65%. The left ventricle has normal function. The left ventricle has no regional wall motion abnormalities. Left ventricular diastolic function could not be evaluated.  2. Right ventricular systolic function is normal. The right ventricular size is normal. There is normal pulmonary artery systolic pressure. The estimated right ventricular systolic pressure is 67.8 mmHg.  3. The trivial pericardial effusion is anterior to the right ventricle.  4. The mitral valve is abnormal. Trivial mitral valve regurgitation.  5. The aortic valve is tricuspid. Aortic valve regurgitation is not visualized.  6. The inferior vena cava is normal in size with greater than 50% respiratory variability, suggesting right atrial pressure of 3 mmHg. Comparison(s): Changes from prior study are noted. 10/25/2019: LVEF 60-65%. FINDINGS  Left Ventricle: Left ventricular ejection fraction, by estimation, is 60 to 65%. The left ventricle has normal function. The left ventricle has no regional wall motion abnormalities. The left ventricular internal cavity size was normal in size. There is  no left ventricular hypertrophy. Left ventricular diastolic function could not be evaluated due to atrial fibrillation. Left ventricular diastolic function could not be evaluated. Right Ventricle: The right  ventricular size is normal. No increase in right ventricular wall thickness. Right ventricular systolic function is normal. There is normal pulmonary artery systolic pressure. The tricuspid regurgitant velocity is 1.94 m/s, and  with an assumed right atrial pressure of 3 mmHg, the estimated right ventricular systolic pressure is 93.8 mmHg. Left Atrium: Left atrial size was normal in size. Right Atrium: Right atrial size was normal in size. Pericardium: Trivial pericardial effusion is present. The pericardial effusion is anterior to the right ventricle. Mitral Valve: The mitral valve is abnormal. There is mild calcification of the mitral valve leaflet(s). Trivial mitral valve regurgitation. MV peak gradient, 6.6 mmHg. The mean mitral valve gradient is 2.0 mmHg. Tricuspid Valve: The tricuspid valve is grossly normal. Tricuspid valve regurgitation is trivial. Aortic Valve: The aortic valve is tricuspid. Aortic valve regurgitation is not visualized. Aortic valve mean gradient measures 3.5 mmHg. Aortic valve peak gradient measures 6.8 mmHg. Aortic valve area, by VTI measures 2.28 cm. Pulmonic Valve: The pulmonic valve was grossly normal. Pulmonic valve regurgitation is not visualized. Aorta: The aortic root and ascending aorta are structurally normal, with no evidence of dilitation. Venous: The inferior vena cava is normal in size with greater than 50% respiratory variability, suggesting right atrial pressure of 3 mmHg. IAS/Shunts: No atrial level shunt detected by color flow Doppler.  LEFT VENTRICLE PLAX 2D LVIDd:         5.60 cm     Diastology LVIDs:         3.20 cm     LV e' medial:   7.94 cm/s LV PW:         0.90 cm     LV E/e' medial: 13.4 LV IVS:        0.80 cm LVOT diam:     2.00 cm LV SV:         63 LV SV Index:   29 LVOT Area:     3.14 cm  LV Volumes (MOD) LV vol d, MOD A2C: 82.3 ml LV vol d, MOD A4C: 75.0 ml LV vol s, MOD A2C:  30.2 ml LV vol s, MOD A4C: 44.9 ml LV SV MOD A2C:     52.1 ml LV SV MOD A4C:      75.0 ml LV SV MOD BP:      42.3 ml RIGHT VENTRICLE RV Basal diam:  3.05 cm RV Mid diam:    3.00 cm RV S prime:     12.20 cm/s TAPSE (M-mode): 2.0 cm LEFT ATRIUM             Index        RIGHT ATRIUM           Index LA diam:        4.40 cm 1.99 cm/m   RA Area:     18.50 cm LA Vol (A2C):   49.9 ml 22.56 ml/m  RA Volume:   48.60 ml  21.98 ml/m LA Vol (A4C):   61.4 ml 27.76 ml/m LA Biplane Vol: 59.1 ml 26.72 ml/m  AORTIC VALVE                    PULMONIC VALVE AV Area (Vmax):    2.03 cm     PV Vmax:       0.73 m/s AV Area (Vmean):   2.27 cm     PV Peak grad:  2.1 mmHg AV Area (VTI):     2.28 cm AV Vmax:           130.50 cm/s AV Vmean:          83.700 cm/s AV VTI:            0.276 m AV Peak Grad:      6.8 mmHg AV Mean Grad:      3.5 mmHg LVOT Vmax:         84.20 cm/s LVOT Vmean:        60.400 cm/s LVOT VTI:          0.201 m LVOT/AV VTI ratio: 0.73  AORTA Ao Root diam: 3.00 cm MITRAL VALVE                TRICUSPID VALVE MV Area (PHT): 4.21 cm     TR Peak grad:   15.1 mmHg MV Area VTI:   2.33 cm     TR Vmax:        194.00 cm/s MV Peak grad:  6.6 mmHg MV Mean grad:  2.0 mmHg     SHUNTS MV Vmax:       1.28 m/s     Systemic VTI:  0.20 m MV Vmean:      62.4 cm/s    Systemic Diam: 2.00 cm MV Decel Time: 180 msec MV E velocity: 106.00 cm/s MV A velocity: 52.30 cm/s MV E/A ratio:  2.03 Lyman Bishop MD Electronically signed by Lyman Bishop MD Signature Date/Time: 04/23/2021/4:39:52 PM    Final     Orson Eva, DO  [facility]  If 7PM-7AM, please contact night-coverage [email or web address] Password [email or web address] 04/25/2021, 4:42 PM   LOS: 3 days

## 2021-04-26 DIAGNOSIS — J9601 Acute respiratory failure with hypoxia: Secondary | ICD-10-CM | POA: Diagnosis not present

## 2021-04-26 DIAGNOSIS — I4891 Unspecified atrial fibrillation: Secondary | ICD-10-CM | POA: Diagnosis not present

## 2021-04-26 DIAGNOSIS — I5031 Acute diastolic (congestive) heart failure: Secondary | ICD-10-CM | POA: Diagnosis not present

## 2021-04-26 LAB — BASIC METABOLIC PANEL
Anion gap: 12 (ref 5–15)
BUN: 26 mg/dL — ABNORMAL HIGH (ref 8–23)
CO2: 39 mmol/L — ABNORMAL HIGH (ref 22–32)
Calcium: 9.3 mg/dL (ref 8.9–10.3)
Chloride: 86 mmol/L — ABNORMAL LOW (ref 98–111)
Creatinine, Ser: 1 mg/dL (ref 0.44–1.00)
GFR, Estimated: 59 mL/min — ABNORMAL LOW (ref >60)
Glucose, Bld: 131 mg/dL — ABNORMAL HIGH (ref 70–99)
Potassium: 3.4 mmol/L — ABNORMAL LOW (ref 3.5–5.1)
Sodium: 137 mmol/L (ref 135–145)

## 2021-04-26 LAB — GLUCOSE, CAPILLARY: Glucose-Capillary: 146 mg/dL — ABNORMAL HIGH (ref 70–99)

## 2021-04-26 LAB — MAGNESIUM: Magnesium: 2.1 mg/dL (ref 1.7–2.4)

## 2021-04-26 MED ORDER — POTASSIUM CHLORIDE CRYS ER 20 MEQ PO TBCR
20.0000 meq | EXTENDED_RELEASE_TABLET | Freq: Once | ORAL | Status: AC
  Administered 2021-04-26: 10:00:00 20 meq via ORAL
  Filled 2021-04-26: qty 1

## 2021-04-26 MED ORDER — FUROSEMIDE 40 MG PO TABS: 40.0000 mg | ORAL_TABLET | Freq: Every day | ORAL | 1 refills | Status: DC

## 2021-04-26 MED ORDER — DILTIAZEM HCL ER COATED BEADS 120 MG PO CP24: 120.0000 mg | ORAL_CAPSULE | Freq: Every day | ORAL | 1 refills | Status: DC

## 2021-04-26 MED ORDER — FUROSEMIDE 40 MG PO TABS: 40.0000 mg | ORAL_TABLET | Freq: Every day | ORAL | Status: DC

## 2021-04-26 NOTE — Progress Notes (Signed)
SATURATION QUALIFICATIONS: (This note is used to comply with regulatory documentation for home oxygen)   Patient Saturations on Room Air at Rest = 91   Patient Saturations on Room Air while Ambulating = 87   Patient Saturations on 3 Liters of oxygen while Ambulating = 96   Please briefly explain why patient needs home oxygen: To maintain 02 sat at 90% or above during ambulation.  Orson Eva, DO

## 2021-04-26 NOTE — Progress Notes (Signed)
Walked in hallway with pt and O2 sat dropped to mid 80's after being 89-91% on room air at rest.  Stated she is in upper 80's and lower 90's when she visits her primary doctor but has never been on O2 at home.  Home O2 ordered and delivered to room.  Denies SOB and edema is improved in both legs.  IV removed and follow up with cardiology in place and to call primary MD for appt.  Scripts sent to [facility].  Husband present and to drive home.  Transported by wheelchair to main entrance with O2 tank.

## 2021-04-26 NOTE — Evaluation (Signed)
Physical Therapy Evaluation Patient Details Name: Brooke Luna MRN: [id number] DOB: [birth year] Today's Date: 04/26/2021  History of Present Illness  Brooke Luna is a 75 y.o. female with medical history significant of COPD, obesity, depression/anxiety, hypertension, atrial fibrillation on chronic anticoagulation (Xarelto) and GERD; who presented to the emergency department secondary to shortness of breath, dyspnea exertion, hypoxia and increased lower extremity swelling.  Patient reports symptom has been present for the last 1-2 weeks and worsening.  Patient was previously exterminated for similar complaints in the past (month ago) and at that time and work-up suggested a component of obesity hypoventilation syndrome.  Patient reports that she has not lost any significant weight since then and has recently noticed on top of this dyspnea on exertion also development of shortness of breath while at rest, difficulty laying down flat, increased lower extremity swelling and also hypoxia.  On the day of admission at the time the EMS arrived to her house patient was found with O2 sats in the mid 70s.     Patient is vaccinated against COVID (no booster); COVID PCR in the ED negative at time of admission.   Clinical Impression  Patient functioning near baseline for functional mobility and gait demonstrating good return for ambulation in room, hallway without loss of balance, on room air with SpO2 dropping from 90% to 85% and limited mostly due to fatigue.  Patient recovered to 95% while on 2 LPM O2 while sitting in chair - RN notified.  Plan:  Patient discharged from physical therapy to care of nursing for ambulation daily as tolerated for length of stay.        Recommendations for follow up therapy are one component of a multi-disciplinary discharge planning process, led by the attending physician.  Recommendations may be updated based on patient status, additional functional criteria and insurance  authorization.  Follow Up Recommendations Home health PT    Assistance Recommended at Discharge PRN  Functional Status Assessment Patient has had a recent decline in their functional status and demonstrates the ability to make significant improvements in function in a reasonable and predictable amount of time.  Equipment Recommendations  None recommended by PT    Recommendations for Other Services       Precautions / Restrictions Precautions Precautions: None Restrictions Weight Bearing Restrictions: No      Mobility  Bed Mobility Overal bed mobility: Modified Independent                  Transfers Overall transfer level: Modified independent                      Ambulation/Gait Ambulation/Gait assistance: Modified independent (Device/Increase time) Gait Distance (Feet): 50 Feet Assistive device: None Gait Pattern/deviations: Decreased step length - left;Decreased stance time - right;Decreased stride length Gait velocity: decreased     General Gait Details: slightly labored cadence without loss of balance, limited mostly due to fatigue  Stairs            Wheelchair Mobility    Modified Rankin (Stroke Patients Only)       Balance Overall balance assessment: No apparent balance deficits (not formally assessed)                                           Pertinent Vitals/Pain Pain Assessment: No/denies pain    Home Living Family/patient expects to be  discharged to:: Private residence Living Arrangements: Spouse/significant other Available Help at Discharge: Family;Available 24 hours/day Type of Home: House Home Access: Stairs to enter Entrance Stairs-Rails: Right;Left;Can reach both Entrance Stairs-Number of Steps: 2   Home Layout: One level Home Equipment: Conservation officer, nature (2 wheels);Shower seat;Cane - single point;Wheelchair - manual      Prior Function Prior Level of Function : Independent/Modified Independent              Mobility Comments: Hydrographic surveyor, drives ADLs Comments: Independent     Hand Dominance        Extremity/Trunk Assessment   Upper Extremity Assessment Upper Extremity Assessment: Overall WFL for tasks assessed    Lower Extremity Assessment Lower Extremity Assessment: Overall WFL for tasks assessed    Cervical / Trunk Assessment Cervical / Trunk Assessment: Normal  Communication   Communication: No difficulties  Cognition Arousal/Alertness: Awake/alert Behavior During Therapy: WFL for tasks assessed/performed Overall Cognitive Status: Within Functional Limits for tasks assessed                                          General Comments      Exercises     Assessment/Plan    PT Assessment All further PT needs can be met in the next venue of care  PT Problem List Decreased strength;Decreased activity tolerance;Decreased mobility       PT Treatment Interventions      PT Goals (Current goals can be found in the Care Plan section)  Acute Rehab PT Goals Patient Stated Goal: return home with family to assist PT Goal Formulation: With patient Time For Goal Achievement: 04/26/21 Potential to Achieve Goals: Good    Frequency     Barriers to discharge        Co-evaluation               AM-PAC PT "6 Clicks" Mobility  Outcome Measure Help needed turning from your back to your side while in a flat bed without using bedrails?: None Help needed moving from lying on your back to sitting on the side of a flat bed without using bedrails?: None Help needed moving to and from a bed to a chair (including a wheelchair)?: None Help needed standing up from a chair using your arms (e.g., wheelchair or bedside chair)?: None Help needed to walk in hospital room?: None Help needed climbing 3-5 steps with a railing? : A Little 6 Click Score: 23    End of Session   Activity Tolerance: Patient tolerated treatment well;Patient limited by  fatigue Patient left: in chair;with call bell/phone within reach Nurse Communication: Mobility status PT Visit Diagnosis: Unsteadiness on feet (R26.81);Other abnormalities of gait and mobility (R26.89);Muscle weakness (generalized) (M62.81)    Time: 5974-1638 PT Time Calculation (min) (ACUTE ONLY): 22 min   Charges:   PT Evaluation $PT Eval Moderate Complexity: 1 Mod PT Treatments $Therapeutic Activity: 8-22 mins        10:18 AM, 04/26/21 Lonell Grandchild, MPT Physical Therapist with [facility] [phone] office [phone] mobile phone

## 2021-04-26 NOTE — Discharge Summary (Addendum)
Physician Discharge Summary  Brooke Luna ZWC:[id number] DOB: 03-08-1946 DOA: 04/22/2021  PCP: [name], MD  Admit date: 04/22/2021 Discharge date: 04/26/2021  Admitted From: Home Disposition:  Home   Recommendations for Outpatient Follow-up:  Follow up with PCP in 1-2 weeks Please obtain BMP/CBC in one week    Equipment/Devices: 2L oxygen  Discharge Condition: Stable CODE STATUS:FULL Diet recommendation: Heart Healthy   Brief/Interim Summary: 75 y.o. female with medical history significant of COPD, obesity, depression/anxiety, hypertension, atrial fibrillation on chronic anticoagulation (Xarelto) and GERD; who presented to the emergency department secondary to shortness of breath, dyspnea exertion, hypoxia and increased lower extremity swelling.  Patient reports symptom has been present for the last 1-2 weeks and worsening.  Patient was previously exterminated for similar complaints in the past (month ago) and at that time and work-up suggested a component of obesity hypoventilation syndrome.  Patient reports that she has not lost any significant weight since then and has recently noticed on top of this dyspnea on exertion also development of shortness of breath while at rest, difficulty laying down flat, increased lower extremity swelling and also hypoxia.  On the day of admission at the time the EMS arrived to her house patient was found with O2 sats in the mid 70s.   Patient is vaccinated against COVID (no booster); COVID PCR in the ED negative at time of admission.   ED Course: Chest x-ray demonstrating cardiomegaly and vascular congestion; elevated BNP and requirement of 3 L nasal cannula supplementation to improve hypoxia.  The rest of her vital signs and blood work unremarkable.  TRH has been called to place in the hospital for further evaluation and management of acute respiratory failure with hypoxia in the setting of CHF exacerbation.  Discharge Diagnoses:  acute  respiratory failure with hypoxia -In the setting of acute on chronic diastolic heart failure and obesity hypoventilation syndrome -Continued IV lasix 40 bid>>home with po 40 mg daily -Continue to follow strict I's and O's,  -daily weights--NEG 7 lbs -Continue to wean off oxygen supplementation as tolerated -2D echo--EF 60-65%, no WMA, normal RV -stable on 3L>>1L -ambulatory pulse ox on day of d/c on RA dropped to 87%>>send home with 2L   COPD -No wheezing and able to speak in full sentences. -Continue Breo   gastroesophageal reflux disease -Continue PPI.   paroxysmal atrial fibrillation -Currently in sinus rhythm -Continue telemetry monitoring -Continue metoprolol and Xarelto. -due to softer BPs, diltiazem CD dose reduced from 300 mg to 120 mg -HR has remained controlled throughout the hospitalization   depression/anxiety -Patient reports experiencing anxiety attacks especially at nighttime -continue Celexa and klonopin   morbid obesity -Body mass index is 43.27 kg/m. -Low calorie diet, portion control and increase physical activity has been discussed with patient.   hypertension -well controlled -Continue heart healthy diet -Continue metoprolol -will not restart cardizem CD   Hypokalemia -replete -mag 2.1     Discharge Instructions   Allergies as of 04/26/2021       Reactions   Codeine Shortness Of Breath, Itching   Meloxicam Other (See Comments)   Stomach iritation        Medication List     TAKE these medications    acetaminophen 325 MG tablet Commonly known as: TYLENOL Take 650 mg by mouth every 6 (six) hours as needed for moderate pain (headache).   citalopram 20 MG tablet Commonly known as: CELEXA Take 20 mg by mouth daily.   clonazePAM 0.5 MG tablet Commonly known  as: KLONOPIN Take 0.5 mg by mouth 2 (two) times daily as needed for anxiety.   diltiazem 120 MG 24 hr capsule Commonly known as: Cardizem CD Take 1 capsule (120 mg total)  by mouth daily. What changed:  medication strength how much to take   diltiazem 30 MG tablet Commonly known as: CARDIZEM Take 30 mg by mouth daily as needed (afib). What changed: Another medication with the same name was removed. Continue taking this medication, and follow the directions you see here.   diphenhydrAMINE 25 MG tablet Commonly known as: BENADRYL Take 25 mg by mouth daily as needed for allergies or sleep.   Excedrin PM 38-500 MG Tabs Generic drug: diphenhydrAMINE-APAP (sleep) Take 1 tablet by mouth at bedtime as needed (sleep/pain).   furosemide 40 MG tablet Commonly known as: LASIX Take 1 tablet (40 mg total) by mouth daily. Start taking on: April 27, 2021 What changed:  medication strength how much to take   metoprolol tartrate 25 MG tablet Commonly known as: LOPRESSOR Take 1 tablet (25 mg total) by mouth 2 (two) times daily.   rivaroxaban 20 MG Tabs tablet Commonly known as: XARELTO Take 20 mg by mouth daily.   Symbicort 160-4.5 MCG/ACT inhaler Generic drug: budesonide-formoterol Inhale 2 puffs into the lungs 2 (two) times daily.               Durable Medical Equipment  (From admission, onward)           Start     Ordered   04/26/21 0818  For home use only DME oxygen  Once       Question Answer Comment  Length of Need 6 Months   Mode or (Route) Nasal cannula   Liters per Minute 2   Frequency Continuous (stationary and portable oxygen unit needed)   Oxygen conserving device Yes   Oxygen delivery system Gas      04/26/21 0817            Allergies  Allergen Reactions   Codeine Shortness Of Breath and Itching   Meloxicam Other (See Comments)    Stomach iritation     Consultations: none   Procedures/Studies: DG Chest 2 View  Result Date: 04/22/2021 CLINICAL DATA:  Dyspnea, shortness of breath, hypoxemia, history atrial fibrillation, asthma EXAM: CHEST - 2 VIEW COMPARISON:  07/17/2020 FINDINGS: Enlargement of cardiac  silhouette with pulmonary vascular congestion. Atherosclerotic calcification aorta. Interstitial infiltrates likely pulmonary edema and CHF. Tiny bibasilar effusions and minimal atelectasis. No pneumothorax. Bones demineralized. IMPRESSION: CHF. Aortic Atherosclerosis (ICD10-I70.0). Electronically Signed   By: Lavonia Dana M.D.   On: 04/22/2021 12:00   ECHOCARDIOGRAM COMPLETE  Result Date: 04/23/2021    ECHOCARDIOGRAM REPORT   Patient Name:   Brooke Luna Date of Exam: 04/23/2021 Medical Rec #:  [id number]       Height:       65.0 in Accession #:    [id number]      Weight:       260.0 lb Date of Birth:  Nov 09, 1945       BSA:          2.211 m Patient Age:    31 years        BP:           172/74 mmHg Patient Gender: F               HR:           79 bpm. Exam Location:  [facility]  Procedure: 2D Echo, Cardiac Doppler and Color Doppler Indications:    Congestive Heart Failure  History:        Patient has prior history of Echocardiogram examinations, most                 recent 10/25/2019. CAD, Arrythmias:Atrial Fibrillation,                 Signs/Symptoms:Dyspnea; Risk Factors:Former Smoker. Ablation.  Sonographer:    Wenda Low Referring Phys: [place]  1. Left ventricular ejection fraction, by estimation, is 60 to 65%. The left ventricle has normal function. The left ventricle has no regional wall motion abnormalities. Left ventricular diastolic function could not be evaluated.  2. Right ventricular systolic function is normal. The right ventricular size is normal. There is normal pulmonary artery systolic pressure. The estimated right ventricular systolic pressure is 97.3 mmHg.  3. The trivial pericardial effusion is anterior to the right ventricle.  4. The mitral valve is abnormal. Trivial mitral valve regurgitation.  5. The aortic valve is tricuspid. Aortic valve regurgitation is not visualized.  6. The inferior vena cava is normal in size with greater than 50% respiratory  variability, suggesting right atrial pressure of 3 mmHg. Comparison(s): Changes from prior study are noted. 10/25/2019: LVEF 60-65%. FINDINGS  Left Ventricle: Left ventricular ejection fraction, by estimation, is 60 to 65%. The left ventricle has normal function. The left ventricle has no regional wall motion abnormalities. The left ventricular internal cavity size was normal in size. There is  no left ventricular hypertrophy. Left ventricular diastolic function could not be evaluated due to atrial fibrillation. Left ventricular diastolic function could not be evaluated. Right Ventricle: The right ventricular size is normal. No increase in right ventricular wall thickness. Right ventricular systolic function is normal. There is normal pulmonary artery systolic pressure. The tricuspid regurgitant velocity is 1.94 m/s, and  with an assumed right atrial pressure of 3 mmHg, the estimated right ventricular systolic pressure is 53.2 mmHg. Left Atrium: Left atrial size was normal in size. Right Atrium: Right atrial size was normal in size. Pericardium: Trivial pericardial effusion is present. The pericardial effusion is anterior to the right ventricle. Mitral Valve: The mitral valve is abnormal. There is mild calcification of the mitral valve leaflet(s). Trivial mitral valve regurgitation. MV peak gradient, 6.6 mmHg. The mean mitral valve gradient is 2.0 mmHg. Tricuspid Valve: The tricuspid valve is grossly normal. Tricuspid valve regurgitation is trivial. Aortic Valve: The aortic valve is tricuspid. Aortic valve regurgitation is not visualized. Aortic valve mean gradient measures 3.5 mmHg. Aortic valve peak gradient measures 6.8 mmHg. Aortic valve area, by VTI measures 2.28 cm. Pulmonic Valve: The pulmonic valve was grossly normal. Pulmonic valve regurgitation is not visualized. Aorta: The aortic root and ascending aorta are structurally normal, with no evidence of dilitation. Venous: The inferior vena cava is normal in  size with greater than 50% respiratory variability, suggesting right atrial pressure of 3 mmHg. IAS/Shunts: No atrial level shunt detected by color flow Doppler.  LEFT VENTRICLE PLAX 2D LVIDd:         5.60 cm     Diastology LVIDs:         3.20 cm     LV e' medial:   7.94 cm/s LV PW:         0.90 cm     LV E/e' medial: 13.4 LV IVS:        0.80 cm LVOT diam:     2.00  cm LV SV:         63 LV SV Index:   29 LVOT Area:     3.14 cm  LV Volumes (MOD) LV vol d, MOD A2C: 82.3 ml LV vol d, MOD A4C: 75.0 ml LV vol s, MOD A2C: 30.2 ml LV vol s, MOD A4C: 44.9 ml LV SV MOD A2C:     52.1 ml LV SV MOD A4C:     75.0 ml LV SV MOD BP:      42.3 ml RIGHT VENTRICLE RV Basal diam:  3.05 cm RV Mid diam:    3.00 cm RV S prime:     12.20 cm/s TAPSE (M-mode): 2.0 cm LEFT ATRIUM             Index        RIGHT ATRIUM           Index LA diam:        4.40 cm 1.99 cm/m   RA Area:     18.50 cm LA Vol (A2C):   49.9 ml 22.56 ml/m  RA Volume:   48.60 ml  21.98 ml/m LA Vol (A4C):   61.4 ml 27.76 ml/m LA Biplane Vol: 59.1 ml 26.72 ml/m  AORTIC VALVE                    PULMONIC VALVE AV Area (Vmax):    2.03 cm     PV Vmax:       0.73 m/s AV Area (Vmean):   2.27 cm     PV Peak grad:  2.1 mmHg AV Area (VTI):     2.28 cm AV Vmax:           130.50 cm/s AV Vmean:          83.700 cm/s AV VTI:            0.276 m AV Peak Grad:      6.8 mmHg AV Mean Grad:      3.5 mmHg LVOT Vmax:         84.20 cm/s LVOT Vmean:        60.400 cm/s LVOT VTI:          0.201 m LVOT/AV VTI ratio: 0.73  AORTA Ao Root diam: 3.00 cm MITRAL VALVE                TRICUSPID VALVE MV Area (PHT): 4.21 cm     TR Peak grad:   15.1 mmHg MV Area VTI:   2.33 cm     TR Vmax:        194.00 cm/s MV Peak grad:  6.6 mmHg MV Mean grad:  2.0 mmHg     SHUNTS MV Vmax:       1.28 m/s     Systemic VTI:  0.20 m MV Vmean:      62.4 cm/s    Systemic Diam: 2.00 cm MV Decel Time: 180 msec MV E velocity: 106.00 cm/s MV A velocity: 52.30 cm/s MV E/A ratio:  2.03 Lyman Bishop MD Electronically signed by  Lyman Bishop MD Signature Date/Time: 04/23/2021/4:39:52 PM    Final         Discharge Exam: Vitals:   04/25/21 2146 04/26/21 0604  BP: 134/61 119/67  Pulse: 72 73  Resp: 20 17  Temp: 97.7 F (36.5 C) 97.6 F (36.4 C)  SpO2: 95% 95%   Vitals:   04/25/21 1354 04/25/21 2146 04/26/21 0500 04/26/21 0604  BP: (!) 106/54 134/61  119/67  Pulse: 82 72  73  Resp: 18 20  17   Temp: 98 F (36.7 C) 97.7 F (36.5 C)  97.6 F (36.4 C)  TempSrc:    Oral  SpO2: 90% 95%  95%  Weight:   118 kg   Height:        General: Pt is alert, awake, not in acute distress Cardiovascular: RRR, S1/S2 +, no rubs, no gallops Respiratory: CTA bilaterally, no wheezing, no rhonchi Abdominal: Soft, NT, ND, bowel sounds + Extremities: trace LE edema, no cyanosis   The results of significant diagnostics from this hospitalization (including imaging, microbiology, ancillary and laboratory) are listed below for reference.    Significant Diagnostic Studies: DG Chest 2 View  Result Date: 04/22/2021 CLINICAL DATA:  Dyspnea, shortness of breath, hypoxemia, history atrial fibrillation, asthma EXAM: CHEST - 2 VIEW COMPARISON:  07/17/2020 FINDINGS: Enlargement of cardiac silhouette with pulmonary vascular congestion. Atherosclerotic calcification aorta. Interstitial infiltrates likely pulmonary edema and CHF. Tiny bibasilar effusions and minimal atelectasis. No pneumothorax. Bones demineralized. IMPRESSION: CHF. Aortic Atherosclerosis (ICD10-I70.0). Electronically Signed   By: Lavonia Dana M.D.   On: 04/22/2021 12:00   ECHOCARDIOGRAM COMPLETE  Result Date: 04/23/2021    ECHOCARDIOGRAM REPORT   Patient Name:   Brooke Luna Date of Exam: 04/23/2021 Medical Rec #:  [id number]       Height:       65.0 in Accession #:    [id number]      Weight:       260.0 lb Date of Birth:  August 25, 1945       BSA:          2.211 m Patient Age:    79 years        BP:           172/74 mmHg Patient Gender: F               HR:           79  bpm. Exam Location:  [facility] Procedure: 2D Echo, Cardiac Doppler and Color Doppler Indications:    Congestive Heart Failure  History:        Patient has prior history of Echocardiogram examinations, most                 recent 10/25/2019. CAD, Arrythmias:Atrial Fibrillation,                 Signs/Symptoms:Dyspnea; Risk Factors:Former Smoker. Ablation.  Sonographer:    Wenda Low Referring Phys: [place]  1. Left ventricular ejection fraction, by estimation, is 60 to 65%. The left ventricle has normal function. The left ventricle has no regional wall motion abnormalities. Left ventricular diastolic function could not be evaluated.  2. Right ventricular systolic function is normal. The right ventricular size is normal. There is normal pulmonary artery systolic pressure. The estimated right ventricular systolic pressure is 02.5 mmHg.  3. The trivial pericardial effusion is anterior to the right ventricle.  4. The mitral valve is abnormal. Trivial mitral valve regurgitation.  5. The aortic valve is tricuspid. Aortic valve regurgitation is not visualized.  6. The inferior vena cava is normal in size with greater than 50% respiratory variability, suggesting right atrial pressure of 3 mmHg. Comparison(s): Changes from prior study are noted. 10/25/2019: LVEF 60-65%. FINDINGS  Left Ventricle: Left ventricular ejection fraction, by estimation, is 60 to 65%. The left ventricle has normal function. The left ventricle has no regional wall motion abnormalities. The left ventricular  internal cavity size was normal in size. There is  no left ventricular hypertrophy. Left ventricular diastolic function could not be evaluated due to atrial fibrillation. Left ventricular diastolic function could not be evaluated. Right Ventricle: The right ventricular size is normal. No increase in right ventricular wall thickness. Right ventricular systolic function is normal. There is normal pulmonary artery systolic  pressure. The tricuspid regurgitant velocity is 1.94 m/s, and  with an assumed right atrial pressure of 3 mmHg, the estimated right ventricular systolic pressure is 25.0 mmHg. Left Atrium: Left atrial size was normal in size. Right Atrium: Right atrial size was normal in size. Pericardium: Trivial pericardial effusion is present. The pericardial effusion is anterior to the right ventricle. Mitral Valve: The mitral valve is abnormal. There is mild calcification of the mitral valve leaflet(s). Trivial mitral valve regurgitation. MV peak gradient, 6.6 mmHg. The mean mitral valve gradient is 2.0 mmHg. Tricuspid Valve: The tricuspid valve is grossly normal. Tricuspid valve regurgitation is trivial. Aortic Valve: The aortic valve is tricuspid. Aortic valve regurgitation is not visualized. Aortic valve mean gradient measures 3.5 mmHg. Aortic valve peak gradient measures 6.8 mmHg. Aortic valve area, by VTI measures 2.28 cm. Pulmonic Valve: The pulmonic valve was grossly normal. Pulmonic valve regurgitation is not visualized. Aorta: The aortic root and ascending aorta are structurally normal, with no evidence of dilitation. Venous: The inferior vena cava is normal in size with greater than 50% respiratory variability, suggesting right atrial pressure of 3 mmHg. IAS/Shunts: No atrial level shunt detected by color flow Doppler.  LEFT VENTRICLE PLAX 2D LVIDd:         5.60 cm     Diastology LVIDs:         3.20 cm     LV e' medial:   7.94 cm/s LV PW:         0.90 cm     LV E/e' medial: 13.4 LV IVS:        0.80 cm LVOT diam:     2.00 cm LV SV:         63 LV SV Index:   29 LVOT Area:     3.14 cm  LV Volumes (MOD) LV vol d, MOD A2C: 82.3 ml LV vol d, MOD A4C: 75.0 ml LV vol s, MOD A2C: 30.2 ml LV vol s, MOD A4C: 44.9 ml LV SV MOD A2C:     52.1 ml LV SV MOD A4C:     75.0 ml LV SV MOD BP:      42.3 ml RIGHT VENTRICLE RV Basal diam:  3.05 cm RV Mid diam:    3.00 cm RV S prime:     12.20 cm/s TAPSE (M-mode): 2.0 cm LEFT ATRIUM              Index        RIGHT ATRIUM           Index LA diam:        4.40 cm 1.99 cm/m   RA Area:     18.50 cm LA Vol (A2C):   49.9 ml 22.56 ml/m  RA Volume:   48.60 ml  21.98 ml/m LA Vol (A4C):   61.4 ml 27.76 ml/m LA Biplane Vol: 59.1 ml 26.72 ml/m  AORTIC VALVE                    PULMONIC VALVE AV Area (Vmax):    2.03 cm     PV Vmax:  0.73 m/s AV Area (Vmean):   2.27 cm     PV Peak grad:  2.1 mmHg AV Area (VTI):     2.28 cm AV Vmax:           130.50 cm/s AV Vmean:          83.700 cm/s AV VTI:            0.276 m AV Peak Grad:      6.8 mmHg AV Mean Grad:      3.5 mmHg LVOT Vmax:         84.20 cm/s LVOT Vmean:        60.400 cm/s LVOT VTI:          0.201 m LVOT/AV VTI ratio: 0.73  AORTA Ao Root diam: 3.00 cm MITRAL VALVE                TRICUSPID VALVE MV Area (PHT): 4.21 cm     TR Peak grad:   15.1 mmHg MV Area VTI:   2.33 cm     TR Vmax:        194.00 cm/s MV Peak grad:  6.6 mmHg MV Mean grad:  2.0 mmHg     SHUNTS MV Vmax:       1.28 m/s     Systemic VTI:  0.20 m MV Vmean:      62.4 cm/s    Systemic Diam: 2.00 cm MV Decel Time: 180 msec MV E velocity: 106.00 cm/s MV A velocity: 52.30 cm/s MV E/A ratio:  2.03 Lyman Bishop MD Electronically signed by Lyman Bishop MD Signature Date/Time: 04/23/2021/4:39:52 PM    Final     Microbiology: Recent Results (from the past 240 hour(s))  Resp Panel by RT-PCR (Flu A&B, Covid) Nasopharyngeal Swab     Status: None   Collection Time: 04/22/21 12:11 PM   Specimen: Nasopharyngeal Swab; Nasopharyngeal(NP) swabs in vial transport medium  Result Value Ref Range Status   SARS Coronavirus 2 by RT PCR NEGATIVE NEGATIVE Final    Comment: (NOTE) SARS-CoV-2 target nucleic acids are NOT DETECTED.  The SARS-CoV-2 RNA is generally detectable in upper respiratory specimens during the acute phase of infection. The lowest concentration of SARS-CoV-2 viral copies this assay can detect is 138 copies/mL. A negative result does not preclude SARS-Cov-2 infection and should not  be used as the sole basis for treatment or other patient management decisions. A negative result may occur with  improper specimen collection/handling, submission of specimen other than nasopharyngeal swab, presence of viral mutation(s) within the areas targeted by this assay, and inadequate number of viral copies(<138 copies/mL). A negative result must be combined with clinical observations, patient history, and epidemiological information. The expected result is Negative.  Fact Sheet for Patients:  EntrepreneurPulse.com.au  Fact Sheet for Healthcare Providers:  IncredibleEmployment.be  This test is no t yet approved or cleared by the [place] FDA and  has been authorized for detection and/or diagnosis of SARS-CoV-2 by FDA under an Emergency Use Authorization (EUA). This EUA will remain  in effect (meaning this test can be used) for the duration of the COVID-19 declaration under Section 564(b)(1) of the Act, 21 U.S.C.section 360bbb-3(b)(1), unless the authorization is terminated  or revoked sooner.       Influenza A by PCR NEGATIVE NEGATIVE Final   Influenza B by PCR NEGATIVE NEGATIVE Final    Comment: (NOTE) The Xpert Xpress SARS-CoV-2/FLU/RSV plus assay is intended as an aid in the diagnosis of influenza from Nasopharyngeal  swab specimens and should not be used as a sole basis for treatment. Nasal washings and aspirates are unacceptable for Xpert Xpress SARS-CoV-2/FLU/RSV testing.  Fact Sheet for Patients: EntrepreneurPulse.com.au  Fact Sheet for Healthcare Providers: IncredibleEmployment.be  This test is not yet approved or cleared by the [place] FDA and has been authorized for detection and/or diagnosis of SARS-CoV-2 by FDA under an Emergency Use Authorization (EUA). This EUA will remain in effect (meaning this test can be used) for the duration of the COVID-19 declaration under Section  564(b)(1) of the Act, 21 U.S.C. section 360bbb-3(b)(1), unless the authorization is terminated or revoked.  Performed at [facility], [address] [place], [place] [address]      Labs: Basic Metabolic Panel: Recent Labs  Lab 04/22/21 1133 04/22/21 1515 04/23/21 0552 04/24/21 0555 04/25/21 0611 04/26/21 0547  NA 134*  --  134* 137 138 137  K 4.0  --  3.6 3.5 3.2* 3.4*  CL 99  --  98 97* 89* 86*  CO2 24  --  26 32 37* 39*  GLUCOSE 135*  --  130* 96 113* 131*  BUN 9  --  9 10 17  26*  CREATININE 0.73  --  0.68 0.70 0.97 1.00  CALCIUM 8.7*  --  8.4* 8.5* 9.2 9.3  MG  --  2.1  --   --  2.0 2.1  PHOS  --  3.4  --   --   --   --    Liver Function Tests: Recent Labs  Lab 04/22/21 1133  AST 21  ALT 18  ALKPHOS 73  BILITOT 2.3*  PROT 7.3  ALBUMIN 4.0   No results for input(s): LIPASE, AMYLASE in the last 168 hours. No results for input(s): AMMONIA in the last 168 hours. CBC: Recent Labs  Lab 04/22/21 1133  WBC 14.0*  NEUTROABS 11.9*  HGB 13.4  HCT 42.0  MCV 90.9  PLT 368   Cardiac Enzymes: No results for input(s): CKTOTAL, CKMB, CKMBINDEX, TROPONINI in the last 168 hours. BNP: Invalid input(s): POCBNP CBG: Recent Labs  Lab 04/26/21 0817  GLUCAP 146*    Time coordinating discharge:  36 minutes  Signed:  Orson Eva, DO [facility] Pager: [phone] 04/26/2021, 10:01 AM

## 2021-04-26 NOTE — TOC Transition Note (Signed)
Transition of Care [facility]) - CM/SW Discharge Note   Patient Details  Name: Brooke Luna MRN: [id number] Date of Birth: Sep 02, 1945  [facility] CM/SW Contact:  Boneta Lucks, RN Phone Number: 04/26/2021, 10:07 AM   Clinical Narrative:   Patient discharging home, needing oxygen. Patient wanted [facility]. TOC called [facility] they accepted the referral and will deliver the oxygen. RN updated.   Final next level of care: Home/Self Care Barriers to Discharge: Barriers Resolved   Patient Goals and CMS Choice Patient states their goals for this hospitalization and ongoing recovery are:: to get better CMS Medicare.gov Compare Post Acute Care list provided to:: Patient Choice offered to / list presented to : Patient  Discharge Placement               Patient to be transferred to facility by: Family   Patient and family notified of of transfer: 04/26/21  Discharge Plan and Services   Discharge Planning Services: CM Consult            DME Arranged: Oxygen DME Agency: [facility] Date DME Agency Contacted: 04/26/21 Time DME Agency Contacted: 1006 Representative spoke with at DME Agency: [phone] option 1      Readmission Risk Interventions Readmission Risk Prevention Plan 04/24/2021  Medication Screening Complete  Transportation Screening Complete  Some recent data might be hidden

## 2021-05-27 ENCOUNTER — Encounter: Payer: Self-pay | Admitting: *Deleted

## 2021-05-27 ENCOUNTER — Encounter: Payer: Self-pay | Admitting: Cardiology

## 2021-05-27 ENCOUNTER — Ambulatory Visit: Payer: Medicare Other | Admitting: Cardiology

## 2021-05-27 VITALS — BP 128/80 | HR 59 | Ht 65.0 in | Wt 260.0 lb

## 2021-05-27 DIAGNOSIS — I10 Essential (primary) hypertension: Secondary | ICD-10-CM

## 2021-05-27 DIAGNOSIS — I480 Paroxysmal atrial fibrillation: Secondary | ICD-10-CM | POA: Diagnosis not present

## 2021-05-27 DIAGNOSIS — D6869 Other thrombophilia: Secondary | ICD-10-CM

## 2021-05-27 MED ORDER — FUROSEMIDE 40 MG PO TABS: 40.0000 mg | ORAL_TABLET | Freq: Every day | ORAL | 6 refills | Status: DC

## 2021-05-27 NOTE — Progress Notes (Signed)
Clinical Summary Brooke Luna is a 76 y.o.female seen today for follow up of the following medical problems.   1. PAF/Aflutter - diagnosed 12/2014 during hospital admission at [facility] - followed by Dr Rayann Heman, she is s/p afib ablation 05/2019   - 11/2019 had admission with atrial flutter. Incidetnal COVID + at the time. She was rate controlled, at follow ups had converted back to SR - ER visit 07/17/20 with palpitations,EKG with aflutter with variable conduction.  - rates were able to be controlled in ER, discharged   - admit to [facility] 01/2021 initially for unintentional overdose of her calcium channel blocker - noted to be in afib with RVR at the time. Found to be RSV+, had a cough at the time.     - no significant palpitatoins.  - no bleeding xarelto  2. HTN - she is compliant with meds     3. CAD - nonobstructive disease by cath 2006 according to prior clinic notes - admit 09/2016 to [facility] with chest pain primarily related to palpitations.    - no recent chest pain   - 05/2019 CT cardiac prior to ablation, no significant CAD - no recent chest pain     4. SOB - chronic stable SOB. - DOE walking in from parking lot. - remote tobacco history age 11 to age 44. - has upcoming appt with Dr Melvyn Novas. Prior PFTs showed some restrictive lung disease   - admit 03/2021 with SOB - managed for acute on chronic diastolic HF and obesity hypoventilation syndrome - BNP 217, CXR pulm edema - 03/2021 echo LVEF 32-35%, indet diastolic, normal RV - home weights 260-262 lbs.Hospital weight 267 lbs, discharge ated 260 lbs.       5. Chronic LLE      Past Medical History:  Diagnosis Date   Arthritis    pain and oa  right knee and pain in rt leg and foot--also pain left knee-but right is worse   Atrial fibrillation (HCC)    Back pain    lumbar bulging disk   Contact dermatitis    underside of left leg--always in the same spots--comes and goes   Diverticulosis    GERD  (gastroesophageal reflux disease)    diet control   Hemorrhoids    Obesity    Persistent atrial fibrillation (HCC)      Allergies  Allergen Reactions   Codeine Shortness Of Breath and Itching   Meloxicam Other (See Comments)    Stomach iritation      Current Outpatient Medications  Medication Sig Dispense Refill   acetaminophen (TYLENOL) 325 MG tablet Take 650 mg by mouth every 6 (six) hours as needed for moderate pain (headache).     budesonide-formoterol (SYMBICORT) 160-4.5 MCG/ACT inhaler Inhale 2 puffs into the lungs 2 (two) times daily.     citalopram (CELEXA) 20 MG tablet Take 20 mg by mouth daily.     clonazePAM (KLONOPIN) 0.5 MG tablet Take 0.5 mg by mouth 2 (two) times daily as needed for anxiety.     diltiazem (CARDIZEM CD) 120 MG 24 hr capsule Take 1 capsule (120 mg total) by mouth daily. 30 capsule 1   diltiazem (CARDIZEM) 30 MG tablet Take 30 mg by mouth daily as needed (afib).     diphenhydrAMINE (BENADRYL) 25 MG tablet Take 25 mg by mouth daily as needed for allergies or sleep.      diphenhydrAMINE-APAP, sleep, (EXCEDRIN PM) 38-500 MG TABS Take 1 tablet by mouth  at bedtime as needed (sleep/pain).     furosemide (LASIX) 40 MG tablet Take 1 tablet (40 mg total) by mouth daily. 30 tablet 1   metoprolol tartrate (LOPRESSOR) 25 MG tablet Take 1 tablet (25 mg total) by mouth 2 (two) times daily. 180 tablet 6   rivaroxaban (XARELTO) 20 MG TABS tablet Take 20 mg by mouth daily.     No current facility-administered medications for this visit.     Past Surgical History:  Procedure Laterality Date   ATRIAL FIBRILLATION ABLATION N/A 06/07/2019   Procedure: ATRIAL FIBRILLATION ABLATION;  Surgeon: Thompson Grayer, MD;  Location: [facility];  Service: Cardiovascular;  Laterality: N/A;   CARDIOVERSION N/A 05/09/2019   Procedure: CARDIOVERSION;  Surgeon: Buford Dresser, MD;  Location: [facility] ENDOSCOPY;  Service: Cardiovascular;  Laterality: N/A;   CATARACT EXTRACTION  W/PHACO Left 02/22/2015   Procedure: CATARACT EXTRACTION PHACO AND INTRAOCULAR LENS PLACEMENT LEFT EYE CDE=5.41;  Surgeon: Tonny Quaneisha Hanisch, MD;  Location: [facility];  Service: Ophthalmology;  Laterality: Left;   CATARACT EXTRACTION W/PHACO Right 03/26/2015   Procedure: CATARACT EXTRACTION PHACO AND INTRAOCULAR LENS PLACEMENT ([place]);  Surgeon: Tonny Eamon Tantillo, MD;  Location: [facility];  Service: Ophthalmology;  Laterality: Right;  CDE:5.01   CHOLECYSTECTOMY     surgery for broken right elbow Right    TOTAL KNEE ARTHROPLASTY  11/03/2011   Procedure: TOTAL KNEE ARTHROPLASTY;  Surgeon: Gearlean Alf, MD;  Location: [facility];  Service: Orthopedics;  Laterality: Right;   TUBAL LIGATION       Allergies  Allergen Reactions   Codeine Shortness Of Breath and Itching   Meloxicam Other (See Comments)    Stomach iritation       Family History  Problem Relation Age of Onset   Diabetes Mother    Stroke Mother    Stroke Father      Social History Ms. Fry reports that she quit smoking about 25 years ago. Her smoking use included cigarettes. She started smoking about 63 years ago. She has a 38.00 pack-year smoking history. She has never used smokeless tobacco. Ms. Kernes reports that she does not currently use alcohol after a past usage of about 1.0 standard drink per week.   Review of Systems CONSTITUTIONAL: No weight loss, fever, chills, weakness or fatigue.  HEENT: Eyes: No visual loss, blurred vision, double vision or yellow sclerae.No hearing loss, sneezing, congestion, runny nose or sore throat.  SKIN: No rash or itching.  CARDIOVASCULAR: per hpi RESPIRATORY: per hpi GASTROINTESTINAL: No anorexia, nausea, vomiting or diarrhea. No abdominal pain or blood.  GENITOURINARY: No burning on urination, no polyuria NEUROLOGICAL: No headache, dizziness, syncope, paralysis, ataxia, numbness or tingling in the extremities. No change in bowel or bladder control.  MUSCULOSKELETAL: No muscle, back pain, joint  pain or stiffness.  LYMPHATICS: No enlarged nodes. No history of splenectomy.  PSYCHIATRIC: No history of depression or anxiety.  ENDOCRINOLOGIC: No reports of sweating, cold or heat intolerance. No polyuria or polydipsia.  Marland Kitchen   Physical Examination Today's Vitals   05/27/21 1401  BP: 128/80  Pulse: (!) 59  SpO2: 98%  Weight: 260 lb (117.9 kg)  Height: 5\' 5"  (1.651 m)   Body mass index is 43.27 kg/m.  Gen: resting comfortably, no acute distress HEENT: no scleral icterus, pupils equal round and reactive, no palptable cervical adenopathy,  CV: RRR, no m/r/g no jvd Resp: Clear to auscultation bilaterally GI: abdomen is soft, non-tender, non-distended, normal bowel sounds, no hepatosplenomegaly MSK: extremities are warm, no edema.  Skin: warm, no rash Neuro:  no focal deficits Psych: appropriate affect   Diagnostic Studies 12/2016 sleep study IMPRESSIONS - Minimal obstructive sleep apnea occurred during this study (AHI = 5.5/h) occurring primarily during supine sleep. - Mild central sleep apnea occurred during this study (CAI = 5.5/h). - The patient had minimal or no oxygen desaturation during the study (Min O2 = 100.00%) - The patient snored with Loud snoring volume. - EKG findings include Atrial Fibrillation. - Mild periodic limb movements of sleep occurred during the study. Associated arousals were significant.   DIAGNOSIS - Obstructive Sleep Apnea (327.23 [G47.33 ICD-10])   RECOMMENDATIONS - Very mild obstructive sleep apnea occurring primarily in supine position. - Recommend avoiding sleeping supine. - Avoid alcohol, sedatives and other CNS depressants that may worsen sleep apnea and disrupt normal sleep architecture. - Sleep hygiene should be reviewed to assess factors that may improve sleep quality. - Weight management and regular exercise should be initiated or continued if appropriate. - Consider referral to ENT for evaluation of possible surgical causes of  snoring.    11/2016 echo Study Conclusions   - Left ventricle: The cavity size was normal. Wall thickness was   normal. Systolic function was normal. The estimated ejection   fraction was in the range of 60% to 65%. Left ventricular   diastolic function parameters were normal. - Left atrium: The atrium was mildly to moderately dilated.   03/2021 echo 1. Left ventricular ejection fraction, by estimation, is 60 to 65%. The  left ventricle has normal function. The left ventricle has no regional  wall motion abnormalities. Left ventricular diastolic function could not  be evaluated.   2. Right ventricular systolic function is normal. The right ventricular  size is normal. There is normal pulmonary artery systolic pressure. The  estimated right ventricular systolic pressure is 40.9 mmHg.   3. The trivial pericardial effusion is anterior to the right ventricle.   4. The mitral valve is abnormal. Trivial mitral valve regurgitation.   5. The aortic valve is tricuspid. Aortic valve regurgitation is not  visualized.   6. The inferior vena cava is normal in size with greater than 50%  respiratory variability, suggesting right atrial pressure of 3 mmHg.    Assessment and Plan  1. PAF/Aflutter/acquired thrombophilia - prior ablation - prior  admission with afib with RVR, she was RSV+ - doing well on current regimen, continue currnent therapy including anticoag   2. HTN - she is at goal, continue current meds  3. Chronic diastolic HF - euvoelmic today, much improved after diuresis during recent admissoin - goal weight 260-262 lbs, instructed fi 263 or more to take additional 20mg  of lasix.    Arnoldo Lenis, M.D.

## 2021-05-27 NOTE — Patient Instructions (Addendum)
Medication Instructions:  Your physician has recommended you make the following change in your medication:  Take furosemide 40 mg daily, may take an additional 20 mg daily as needed for weight above 263 lbs Continue other  medications the same  Labwork: none  Testing/Procedures: none  Follow-Up: Your physician recommends that you schedule a follow-up appointment in: 6 months  Any Other Special Instructions Will Be Listed Below (If Applicable).  If you need a refill on your cardiac medications before your next appointment, please call your pharmacy.

## 2021-05-31 ENCOUNTER — Ambulatory Visit: Payer: No Typology Code available for payment source | Admitting: Internal Medicine

## 2021-06-05 ENCOUNTER — Encounter (HOSPITAL_COMMUNITY): Payer: Self-pay | Admitting: Nurse Practitioner

## 2021-06-05 ENCOUNTER — Ambulatory Visit (HOSPITAL_COMMUNITY)
Admission: RE | Admit: 2021-06-05 | Discharge: 2021-06-05 | Disposition: A | Discharge status: Home / Self Care | Payer: Medicare Other | Source: Ambulatory Visit | Attending: Nurse Practitioner | Admitting: Nurse Practitioner

## 2021-06-05 ENCOUNTER — Other Ambulatory Visit: Payer: Self-pay

## 2021-06-05 VITALS — BP 114/78 | HR 62 | Ht 65.0 in | Wt 265.0 lb

## 2021-06-05 DIAGNOSIS — Z7901 Long term (current) use of anticoagulants: Secondary | ICD-10-CM | POA: Insufficient documentation

## 2021-06-05 DIAGNOSIS — D6869 Other thrombophilia: Secondary | ICD-10-CM | POA: Diagnosis not present

## 2021-06-05 DIAGNOSIS — E669 Obesity, unspecified: Secondary | ICD-10-CM | POA: Insufficient documentation

## 2021-06-05 DIAGNOSIS — I10 Essential (primary) hypertension: Secondary | ICD-10-CM | POA: Diagnosis not present

## 2021-06-05 DIAGNOSIS — I2510 Atherosclerotic heart disease of native coronary artery without angina pectoris: Secondary | ICD-10-CM | POA: Diagnosis not present

## 2021-06-05 DIAGNOSIS — I480 Paroxysmal atrial fibrillation: Secondary | ICD-10-CM | POA: Diagnosis not present

## 2021-06-05 DIAGNOSIS — Z87891 Personal history of nicotine dependence: Secondary | ICD-10-CM | POA: Diagnosis not present

## 2021-06-05 NOTE — Progress Notes (Signed)
Primary Care Physician: [name], MD Referring Physician: [facility] triage Cardiologist: Dr. Doran Durand Brooke Luna is a 76 y.o. female with a h/o HTN, CAD, obesity as well as  paroxsymal afib since 2016. She has been scheduled for a few cardioversions but always returns to [place] before the procedure.   She is on xarelto 20 mg qd for a chadsvasc score of at least 4.   She does not smoke or drink alcohol, minimum caffeine. She does snore. She states that she sleeps in a recliner because of her back and she doesn't breath well on her back.   She in in the afib clinic today as her afib burden has increased to the point that sh is now persistent x 3 weeks and wants options to explore how to mange going forward. AAD's were discussed but pt did not want to purse.  F/u in afib clinic, 05/03/19. She called to be seen for afib for the last 4-5 days. She has increased shortness of breath, and fatigue. She  would want an ablation. This would not be by guidelines to use AAD's first. She would not be an candidate for flecainide or Multaq  because of reduced  EF. She does not like the SE profile of amiodarone and Celexa may cause an issue with  qtc with Tikosyn or  Sotalol. She is not very willing to consider changing Celexa.  For the time being I will schedule cardioversion. She would like to skip antiarrythmic's and be considered for ablation. I will get her an appointment with EP after cardioversion. She is using 30 mg Cardizem every 4 hours to control v rates. She continues on xarelto for CHA2DS2VASc score of 4.   F/u in afib clinic,05/16/19. She had a successful cardioversion 05/09/19. She continues in SR and feels improved.   F/u in afib clinic, 07/05/19. She is one month s/p ablation and feels great. No afib to report.EKG shows SR. No groin or swallowing issues. Continues on xarelto 20 mg qd without interruption with a CHA2DS2VASc of 2.  F/u in afib clinic, 04/12/20. She had afib in August.  This was in the setting of her husband having covid and being hospitalized and having an argument with her husband's daughter, which made her very upset. She presented to the  ER with afib with  RVR and was admitted for observation. She tested positive for covid at that time but pt states she  did not have any symptoms. She is vaccinated. No afib since that time. She has not been able to loose weight and still has some shortness of breath with walking for a  long distance, probably related to weight and inactivity.   F/u in afib clinic, 06/05/21.  This is a 3 month f/u from  Dr. Rayann Heman. She has done well maintaining  SR since ablation. She is in SR. Has not noted any afib. No afib lately to report.   Today, she denies symptoms of palpitations, chest pain,+ shortness of breath,- orthopnea, PND, lower extremity edema, dizziness, presyncope, syncope, or neurologic sequela. The patient is tolerating medications without difficulties and is otherwise without complaint today.   Past Medical History:  Diagnosis Date   Arthritis    pain and oa  right knee and pain in rt leg and foot--also pain left knee-but right is worse   Atrial fibrillation (HCC)    Back pain    lumbar bulging disk   Contact dermatitis    underside of left leg--always  in the same spots--comes and goes   Diverticulosis    GERD (gastroesophageal reflux disease)    diet control   Hemorrhoids    Obesity    Persistent atrial fibrillation ([place])    Past Surgical History:  Procedure Laterality Date   ATRIAL FIBRILLATION ABLATION N/A 06/07/2019   Procedure: ATRIAL FIBRILLATION ABLATION;  Surgeon: Thompson Grayer, MD;  Location: [place] [facility];  Service: Cardiovascular;  Laterality: N/A;   CARDIOVERSION N/A 05/09/2019   Procedure: CARDIOVERSION;  Surgeon: Buford Dresser, MD;  Location: [facility] ENDOSCOPY;  Service: Cardiovascular;  Laterality: N/A;   CATARACT EXTRACTION W/PHACO Left 02/22/2015   Procedure: CATARACT EXTRACTION PHACO AND  INTRAOCULAR LENS PLACEMENT LEFT EYE CDE=5.41;  Surgeon: Tonny Branch, MD;  Location: [facility];  Service: Ophthalmology;  Laterality: Left;   CATARACT EXTRACTION W/PHACO Right 03/26/2015   Procedure: CATARACT EXTRACTION PHACO AND INTRAOCULAR LENS PLACEMENT ([place]);  Surgeon: Tonny Branch, MD;  Location: [facility];  Service: Ophthalmology;  Laterality: Right;  CDE:5.01   CHOLECYSTECTOMY     surgery for broken right elbow Right    TOTAL KNEE ARTHROPLASTY  11/03/2011   Procedure: TOTAL KNEE ARTHROPLASTY;  Surgeon: Gearlean Alf, MD;  Location: [facility];  Service: Orthopedics;  Laterality: Right;   TUBAL LIGATION      Current Outpatient Medications  Medication Sig Dispense Refill   acetaminophen (TYLENOL) 325 MG tablet Take 650 mg by mouth every 6 (six) hours as needed for moderate pain (headache).     budesonide-formoterol (SYMBICORT) 160-4.5 MCG/ACT inhaler Inhale 2 puffs into the lungs 2 (two) times daily.     citalopram (CELEXA) 20 MG tablet Take 20 mg by mouth daily.     clonazePAM (KLONOPIN) 0.5 MG tablet Take 0.5 mg by mouth 2 (two) times daily as needed for anxiety.     diltiazem (CARDIZEM CD) 120 MG 24 hr capsule Take 1 capsule (120 mg total) by mouth daily. 30 capsule 1   diltiazem (CARDIZEM) 30 MG tablet Take 30 mg by mouth daily as needed (afib).     diphenhydrAMINE (BENADRYL) 25 MG tablet Take 25 mg by mouth daily as needed for allergies or sleep.      diphenhydrAMINE-APAP, sleep, (EXCEDRIN PM) 38-500 MG TABS Take 1 tablet by mouth at bedtime as needed (sleep/pain).     furosemide (LASIX) 40 MG tablet Take 1 tablet (40 mg total) by mouth daily. May taken an additional 1/2 tablet (20 mg) daily as needed for weight above 263 lbs 45 tablet 6   metoprolol tartrate (LOPRESSOR) 25 MG tablet Take 1 tablet (25 mg total) by mouth 2 (two) times daily. 180 tablet 6   rivaroxaban (XARELTO) 20 MG TABS tablet Take 20 mg by mouth daily.     No current facility-administered medications for this encounter.     Allergies  Allergen Reactions   Codeine Shortness Of Breath and Itching   Meloxicam Other (See Comments)    Stomach iritation     Social History   Socioeconomic History   Marital status: Married    Spouse name: Not on file   Number of children: Not on file   Years of education: Not on file   Highest education level: Not on file  Occupational History   Not on file  Tobacco Use   Smoking status: Former    Packs/day: 1.00    Years: 38.00    Pack years: 38.00    Types: Cigarettes    Start date: 06/25/1957    Quit date: 04/28/1996  Years since quitting: 25.1   Smokeless tobacco: Never   Tobacco comments:    quit smoking 1998  Vaping Use   Vaping Use: Never used  Substance and Sexual Activity   Alcohol use: Yes    Alcohol/week: 4.0 standard drinks    Types: 4 Cans of beer per week    Comment: 2 beers twice a week 06/05/21   Drug use: No   Sexual activity: Not on file  Other Topics Concern   Not on file  Social History Narrative   Lives in [place] with spouse.   Retired.   Social Determinants of Health   Financial Resource Strain: Not on file  Food Insecurity: Not on file  Transportation Needs: Not on file  Physical Activity: Not on file  Stress: Not on file  Social Connections: Not on file  Intimate Partner Violence: Not on file    Family History  Problem Relation Age of Onset   Diabetes Mother    Stroke Mother    Stroke Father     ROS- All systems are reviewed and negative except as per the HPI above  Physical Exam: Vitals:   06/05/21 1325  BP: 114/78  Pulse: 62  Weight: 120.2 kg  Height: 5\' 5"  (1.651 m)   Wt Readings from Last 3 Encounters:  06/05/21 120.2 kg  05/27/21 117.9 kg  04/26/21 118 kg    Labs: Lab Results  Component Value Date   NA 137 04/26/2021   K 3.4 (L) 04/26/2021   CL 86 (L) 04/26/2021   CO2 39 (H) 04/26/2021   GLUCOSE 131 (H) 04/26/2021   BUN 26 (H) 04/26/2021   CREATININE 1.00 04/26/2021   CALCIUM 9.3 04/26/2021    PHOS 3.4 04/22/2021   MG 2.1 04/26/2021   Lab Results  Component Value Date   INR 0.95 10/23/2011   No results found for: CHOL, HDL, LDLCALC, TRIG   GEN- The patient is well appearing, alert and oriented x 3 today.   Head- normocephalic, atraumatic Eyes-  Sclera clear, conjunctiva pink Ears- hearing intact Oropharynx- clear Neck- supple, no JVP Lymph- no cervical lymphadenopathy Lungs- Clear to ausculation bilaterally, normal work of breathing Heart- regular rate and rhythm, no murmurs, rubs or gallops, PMI not laterally displaced GI- soft, NT, ND, + BS Extremities- no clubbing, cyanosis, left lower extremity edema worse than right(1+/trace)  MS- no significant deformity or atrophy Skin- no rash or lesion Psych- euthymic mood, full affect Neuro- strength and sensation are intact  EKG-  NSR at 67 bpm, pr int 160 ms, qrs int 84 ms qtc 443 ms Epic records reviewed Echo- Study Conclusions 01/17/19- EF abnormal at 45-50%       Assessment and Plan: 1. Paroxysmal afib  S/p ablation 06/07/19 She is doing well maintaining SR Continue daily cardizem 120 mg   and metoprolol tartrate 25 mg bid    2.CHA2DS2VASc score of 2 Continue xarelto for chadsvasc score of 4    F/u with Dr. Harl Bowie as scheduled and afib clinic as needed   Wiggins. Brooke Luna, [facility] [address] [place], [place] [address] [phone]

## 2021-06-25 ENCOUNTER — Other Ambulatory Visit: Payer: Self-pay | Admitting: Cardiology

## 2021-07-08 ENCOUNTER — Other Ambulatory Visit: Payer: Self-pay

## 2021-07-08 ENCOUNTER — Other Ambulatory Visit: Payer: Self-pay | Admitting: Cardiology

## 2021-07-08 ENCOUNTER — Telehealth: Payer: Self-pay | Admitting: Cardiology

## 2021-07-08 MED ORDER — DILTIAZEM HCL 30 MG PO TABS: 30.0000 mg | ORAL_TABLET | Freq: Every day | ORAL | 6 refills | Status: AC | PRN

## 2021-07-08 MED ORDER — METOPROLOL TARTRATE 25 MG PO TABS: 25.0000 mg | ORAL_TABLET | Freq: Two times a day (BID) | ORAL | 6 refills | Status: DC

## 2021-07-08 NOTE — Telephone Encounter (Signed)
Refilled as requested diltiazem 30 mg qd,prn for afib #30 with RF:6 ?

## 2021-07-08 NOTE — Telephone Encounter (Signed)
?*  STAT* If patient is at the pharmacy, call can be transferred to refill team. ? ? ?1. Which medications need to be refilled? (please list name of each medication and dose if known) diltiazem (CARDIZEM) 30 MG tablet ? ?2. Which pharmacy/location (including street and city if local pharmacy) is medication to be sent to? [id number] [place] [place] [address]? ?3. Do they need a 30 day or 90 day supply? 30 with refills  ? ? ?Patient said her current rx was written in 2019. She would just like a new rx to keep on hand as needed  ?

## 2021-07-12 ENCOUNTER — Observation Stay (HOSPITAL_COMMUNITY)
Admission: EM | Admit: 2021-07-12 | Discharge: 2021-07-12 | Disposition: A | Discharge status: Home / Self Care | Payer: Medicare Other | Attending: Family Medicine | Admitting: Family Medicine

## 2021-07-12 ENCOUNTER — Other Ambulatory Visit: Payer: Self-pay

## 2021-07-12 ENCOUNTER — Encounter (HOSPITAL_COMMUNITY): Payer: Self-pay

## 2021-07-12 ENCOUNTER — Emergency Department (HOSPITAL_COMMUNITY): Payer: Medicare Other

## 2021-07-12 DIAGNOSIS — F419 Anxiety disorder, unspecified: Secondary | ICD-10-CM | POA: Diagnosis not present

## 2021-07-12 DIAGNOSIS — R609 Edema, unspecified: Secondary | ICD-10-CM | POA: Insufficient documentation

## 2021-07-12 DIAGNOSIS — I4892 Unspecified atrial flutter: Secondary | ICD-10-CM | POA: Diagnosis not present

## 2021-07-12 DIAGNOSIS — J441 Chronic obstructive pulmonary disease with (acute) exacerbation: Secondary | ICD-10-CM | POA: Diagnosis present

## 2021-07-12 DIAGNOSIS — R0602 Shortness of breath: Secondary | ICD-10-CM | POA: Diagnosis not present

## 2021-07-12 DIAGNOSIS — R0789 Other chest pain: Secondary | ICD-10-CM | POA: Diagnosis present

## 2021-07-12 DIAGNOSIS — Z79899 Other long term (current) drug therapy: Secondary | ICD-10-CM | POA: Insufficient documentation

## 2021-07-12 DIAGNOSIS — I483 Typical atrial flutter: Secondary | ICD-10-CM | POA: Diagnosis not present

## 2021-07-12 DIAGNOSIS — J449 Chronic obstructive pulmonary disease, unspecified: Secondary | ICD-10-CM | POA: Diagnosis present

## 2021-07-12 DIAGNOSIS — Z20822 Contact with and (suspected) exposure to covid-19: Secondary | ICD-10-CM | POA: Diagnosis not present

## 2021-07-12 HISTORY — DX: Heart failure, unspecified: I50.9

## 2021-07-12 LAB — CBC WITH DIFFERENTIAL/PLATELET
Abs Immature Granulocytes: 0.05 10*3/uL (ref 0.00–0.07)
Abs Immature Granulocytes: 0.04 10*3/uL (ref 0.00–0.07)
Basophils Absolute: 0.1 10*3/uL (ref 0.0–0.1)
Basophils Absolute: 0.1 10*3/uL (ref 0.0–0.1)
Basophils Relative: 0 %
Basophils Relative: 1 %
Eosinophils Absolute: 0.6 10*3/uL — ABNORMAL HIGH (ref 0.0–0.5)
Eosinophils Absolute: 0.6 10*3/uL — ABNORMAL HIGH (ref 0.0–0.5)
Eosinophils Relative: 4 %
Eosinophils Relative: 5 %
HCT: 45.7 % (ref 36.0–46.0)
HCT: 45.9 % (ref 36.0–46.0)
Hemoglobin: 14.6 g/dL (ref 12.0–15.0)
Hemoglobin: 14.7 g/dL (ref 12.0–15.0)
Immature Granulocytes: 0 %
Immature Granulocytes: 0 %
Lymphocytes Relative: 19 %
Lymphocytes Relative: 17 %
Lymphs Abs: 2.5 10*3/uL (ref 0.7–4.0)
Lymphs Abs: 2.1 10*3/uL (ref 0.7–4.0)
MCH: 28.2 pg (ref 26.0–34.0)
MCH: 28.5 pg (ref 26.0–34.0)
MCHC: 31.9 g/dL (ref 30.0–36.0)
MCHC: 32 g/dL (ref 30.0–36.0)
MCV: 88.4 fL (ref 80.0–100.0)
MCV: 89.1 fL (ref 80.0–100.0)
Monocytes Absolute: 0.8 10*3/uL (ref 0.1–1.0)
Monocytes Absolute: 0.7 10*3/uL (ref 0.1–1.0)
Monocytes Relative: 6 %
Monocytes Relative: 5 %
Neutro Abs: 9.4 10*3/uL — ABNORMAL HIGH (ref 1.7–7.7)
Neutro Abs: 9 10*3/uL — ABNORMAL HIGH (ref 1.7–7.7)
Neutrophils Relative %: 71 %
Neutrophils Relative %: 72 %
Platelets: 415 10*3/uL — ABNORMAL HIGH (ref 150–400)
Platelets: 409 10*3/uL — ABNORMAL HIGH (ref 150–400)
RBC: 5.17 MIL/uL — ABNORMAL HIGH (ref 3.87–5.11)
RBC: 5.15 MIL/uL — ABNORMAL HIGH (ref 3.87–5.11)
RDW: 14.3 % (ref 11.5–15.5)
RDW: 14.3 % (ref 11.5–15.5)
WBC: 13.5 10*3/uL — ABNORMAL HIGH (ref 4.0–10.5)
WBC: 12.5 10*3/uL — ABNORMAL HIGH (ref 4.0–10.5)
nRBC: 0 % (ref 0.0–0.2)
nRBC: 0 % (ref 0.0–0.2)

## 2021-07-12 LAB — URINALYSIS, ROUTINE W REFLEX MICROSCOPIC
Bilirubin Urine: NEGATIVE
Glucose, UA: NEGATIVE mg/dL
Ketones, ur: NEGATIVE mg/dL
Leukocytes,Ua: NEGATIVE
Nitrite: NEGATIVE
Protein, ur: NEGATIVE mg/dL
Specific Gravity, Urine: 1.016 (ref 1.005–1.030)
pH: 5 (ref 5.0–8.0)

## 2021-07-12 LAB — COMPREHENSIVE METABOLIC PANEL
ALT: 15 U/L (ref 0–44)
ALT: 13 U/L (ref 0–44)
AST: 14 U/L — ABNORMAL LOW (ref 15–41)
AST: 13 U/L — ABNORMAL LOW (ref 15–41)
Albumin: 3.8 g/dL (ref 3.5–5.0)
Albumin: 3.9 g/dL (ref 3.5–5.0)
Alkaline Phosphatase: 72 U/L (ref 38–126)
Alkaline Phosphatase: 66 U/L (ref 38–126)
Anion gap: 10 (ref 5–15)
Anion gap: 8 (ref 5–15)
BUN: 23 mg/dL (ref 8–23)
BUN: 23 mg/dL (ref 8–23)
CO2: 28 mmol/L (ref 22–32)
CO2: 25 mmol/L (ref 22–32)
Calcium: 8.8 mg/dL — ABNORMAL LOW (ref 8.9–10.3)
Calcium: 8.5 mg/dL — ABNORMAL LOW (ref 8.9–10.3)
Chloride: 99 mmol/L (ref 98–111)
Chloride: 100 mmol/L (ref 98–111)
Creatinine, Ser: 0.94 mg/dL (ref 0.44–1.00)
Creatinine, Ser: 0.88 mg/dL (ref 0.44–1.00)
GFR, Estimated: >60 mL/min (ref >60)
GFR, Estimated: >60 mL/min (ref >60)
Glucose, Bld: 134 mg/dL — ABNORMAL HIGH (ref 70–99)
Glucose, Bld: 163 mg/dL — ABNORMAL HIGH (ref 70–99)
Potassium: 3.9 mmol/L (ref 3.5–5.1)
Potassium: 3.9 mmol/L (ref 3.5–5.1)
Sodium: 137 mmol/L (ref 135–145)
Sodium: 133 mmol/L — ABNORMAL LOW (ref 135–145)
Total Bilirubin: 0.4 mg/dL (ref 0.3–1.2)
Total Bilirubin: 0.9 mg/dL (ref 0.3–1.2)
Total Protein: 7.1 g/dL (ref 6.5–8.1)
Total Protein: 7.2 g/dL (ref 6.5–8.1)

## 2021-07-12 LAB — MAGNESIUM: Magnesium: 2 mg/dL (ref 1.7–2.4)

## 2021-07-12 LAB — RESP PANEL BY RT-PCR (FLU A&B, COVID) ARPGX2
Influenza A by PCR: NEGATIVE
Influenza B by PCR: NEGATIVE
SARS Coronavirus 2 by RT PCR: NEGATIVE

## 2021-07-12 LAB — BRAIN NATRIURETIC PEPTIDE: B Natriuretic Peptide: 76 pg/mL (ref 0.0–100.0)

## 2021-07-12 LAB — TROPONIN I (HIGH SENSITIVITY)
Troponin I (High Sensitivity): 3 ng/L (ref <18)
Troponin I (High Sensitivity): 3 ng/L (ref <18)

## 2021-07-12 LAB — TSH: TSH: 4.38 u[IU]/mL (ref 0.350–4.500)

## 2021-07-12 MED ORDER — ONDANSETRON HCL 4 MG/2ML IJ SOLN: 4.0000 mg | Freq: Four times a day (QID) | INTRAMUSCULAR | Status: DC | PRN

## 2021-07-12 MED ORDER — METOPROLOL TARTRATE 25 MG PO TABS
25.0000 mg | ORAL_TABLET | Freq: Two times a day (BID) | ORAL | Status: DC
  Administered 2021-07-12: 25 mg via ORAL
  Filled 2021-07-12: qty 1

## 2021-07-12 MED ORDER — ETOMIDATE 2 MG/ML IV SOLN
10.0000 mg | Freq: Once | INTRAVENOUS | Status: AC
  Administered 2021-07-12: 10 mg via INTRAVENOUS
  Filled 2021-07-12: qty 10

## 2021-07-12 MED ORDER — CITALOPRAM HYDROBROMIDE 20 MG PO TABS
20.0000 mg | ORAL_TABLET | Freq: Every day | ORAL | Status: DC
  Administered 2021-07-12: 20 mg via ORAL
  Filled 2021-07-12: qty 1

## 2021-07-12 MED ORDER — POLYETHYLENE GLYCOL 3350 17 G PO PACK: 17.0000 g | PACK | Freq: Every day | ORAL | Status: DC | PRN

## 2021-07-12 MED ORDER — DILTIAZEM HCL ER COATED BEADS 120 MG PO CP24: 120.0000 mg | ORAL_CAPSULE | Freq: Every day | ORAL | Status: DC

## 2021-07-12 MED ORDER — ACETAMINOPHEN 650 MG RE SUPP: 650.0000 mg | Freq: Four times a day (QID) | RECTAL | Status: DC | PRN

## 2021-07-12 MED ORDER — DILTIAZEM HCL-DEXTROSE 125-5 MG/125ML-% IV SOLN (PREMIX)
5.0000 mg/h | INTRAVENOUS | Status: DC
  Administered 2021-07-12: 5 mg/h via INTRAVENOUS
  Administered 2021-07-12: 15 mg/h via INTRAVENOUS
  Filled 2021-07-12 (×2): qty 125

## 2021-07-12 MED ORDER — DILTIAZEM HCL 30 MG PO TABS
60.0000 mg | ORAL_TABLET | Freq: Four times a day (QID) | ORAL | Status: DC
  Administered 2021-07-12: 60 mg via ORAL
  Filled 2021-07-12: qty 2

## 2021-07-12 MED ORDER — DILTIAZEM LOAD VIA INFUSION
5.0000 mg | Freq: Once | INTRAVENOUS | Status: AC
  Administered 2021-07-12: 5 mg via INTRAVENOUS
  Filled 2021-07-12: qty 5

## 2021-07-12 MED ORDER — CLONAZEPAM 0.5 MG PO TABS: 0.5000 mg | ORAL_TABLET | Freq: Two times a day (BID) | ORAL | Status: DC | PRN

## 2021-07-12 MED ORDER — MOMETASONE FURO-FORMOTEROL FUM 200-5 MCG/ACT IN AERO: 2.0000 | INHALATION_SPRAY | Freq: Two times a day (BID) | RESPIRATORY_TRACT | Status: DC

## 2021-07-12 MED ORDER — DILTIAZEM HCL 25 MG/5ML IV SOLN
10.0000 mg | Freq: Once | INTRAVENOUS | Status: AC
  Administered 2021-07-12: 10 mg via INTRAVENOUS
  Filled 2021-07-12: qty 5

## 2021-07-12 MED ORDER — ACETAMINOPHEN 325 MG PO TABS: 650.0000 mg | ORAL_TABLET | Freq: Four times a day (QID) | ORAL | Status: DC | PRN

## 2021-07-12 MED ORDER — RIVAROXABAN 20 MG PO TABS
20.0000 mg | ORAL_TABLET | Freq: Every day | ORAL | Status: DC
  Administered 2021-07-12: 20 mg via ORAL
  Filled 2021-07-12: qty 1

## 2021-07-12 MED ORDER — OXYCODONE HCL 5 MG PO TABS: 5.0000 mg | ORAL_TABLET | ORAL | Status: DC | PRN

## 2021-07-12 MED ORDER — ONDANSETRON HCL 4 MG PO TABS: 4.0000 mg | ORAL_TABLET | Freq: Four times a day (QID) | ORAL | Status: DC | PRN

## 2021-07-12 NOTE — ED Provider Notes (Signed)
.  Cardioversion ? ?Date/Time: 07/12/2021 1:24 PM ?Performed by: Hayden Rasmussen, MD ?Authorized by: Hayden Rasmussen, MD  ? ?Consent:  ?  Consent obtained:  Written ?  Consent given by:  Patient ?  Risks discussed:  Cutaneous burn, death, induced arrhythmia and pain ?  Alternatives discussed:  Anti-coagulation medication, delayed treatment, rate-control medication, observation and referral ?Pre-procedure details:  ?  Cardioversion basis:  Elective ?  Rhythm:  Atrial fibrillation ?  Electrode placement:  Anterior-posterior ?Patient sedated: Yes. Refer to sedation procedure documentation for details of sedation. ? ?Attempt one:  ?  Cardioversion mode:  Synchronous ?  Shock (Joules):  150 ?  Shock outcome:  Conversion to normal sinus rhythm ?Post-procedure details:  ?  Patient status:  Awake ?  Patient tolerance of procedure:  Tolerated well, no immediate complications ? ?  ?Hayden Rasmussen, MD ?07/12/21 1716 ? ?

## 2021-07-12 NOTE — Hospital Course (Addendum)
76 y.o. female with history of atrial fibrillation, CHF, diverticulosis, GERD, obesity, and more presents the ED with a chief complaint of elevated heart rate.  Patient reports that she has had palpitations for about 5 days.  This started suddenly 5 days ago.  She reports she was not doing anything in particular, was not exerting herself.  She reports that she wears oxygen at home with exertion, but was not wearing oxygen at that time.  She monitors her pulse oximeter and saw heart rates as high as 150, oxygen saturations were staying about normal at 92%.  Patient reports when she is exerting herself her oxygen saturations dropped down to 88%, but that is normal for her.  She has not had any dizziness or chest pain.  She reports that after 2 days her heart rate stabilized 134.  She then denies chest pain but rule reports a dull ache in her chest that was unpredictable.  She still had palpitations last for couple minutes at a time.  Patient takes long-acting Cardizem and a as needed short acting Cardizem.  She reports she took for short acting Cardizem and it did nothing to help her.  Patient has no other complaints at this time. ? ?07/12/2021:  pt had successful cardioversion in ED. Discussed with Dr. Harl Bowie, can discharge home today. No changes to cardiac medication.  Cardiac follow up arranged.   ?

## 2021-07-12 NOTE — ED Provider Notes (Signed)
I was asked by cardiology to evaluate this patient for possible cardioversion.  She has a history of atrial fibrillation atrial flutter and was admitted for tachycardia.  Patient currently on a Cardizem drip.  Heart rates currently controlled and blood pressure good.  She did eat breakfast at 9 AM.  Has been n.p.o. since then.  She states she has been cardioverted before. she is agreeable to proceed with cardioversion. ? ?.Sedation ? ?Date/Time: 07/12/2021 1:22 PM ?Performed by: Hayden Rasmussen, MD ?Authorized by: Hayden Rasmussen, MD  ? ?Consent:  ?  Consent obtained:  Written ?  Consent given by:  Patient ?  Risks discussed:  Dysrhythmia, prolonged sedation necessitating reversal, inadequate sedation, nausea, vomiting, respiratory compromise necessitating ventilatory assistance and intubation, prolonged hypoxia resulting in organ damage and allergic reaction ?  Alternatives discussed:  Analgesia without sedation ?Universal protocol:  ?  Procedure explained and questions answered to patient or proxy's satisfaction: yes   ?  Immediately prior to procedure, a time out was called: yes   ?  Patient identity confirmed:  Arm band ?Indications:  ?  Procedure performed:  Cardioversion ?Pre-sedation assessment:  ?  Time since last food or drink:  4 ?  ASA classification: class 3 - patient with severe systemic disease   ?  Mouth opening:  2 finger widths ?  Thyromental distance:  3 finger widths ?  Mallampati score:  III - soft palate, base of uvula visible ?  Neck mobility: normal   ?  Pre-sedation assessments completed and reviewed: pre-procedure airway patency not reviewed, pre-procedure cardiovascular function not reviewed, pre-procedure hydration status not reviewed, pre-procedure mental status not reviewed, pre-procedure nausea and vomiting status not reviewed, pre-procedure pain level not reviewed, pre-procedure respiratory function not reviewed and pre-procedure temperature not reviewed   ?  Pre-sedation assessment  completed:  07/12/2021 1:00 PM ?Immediate pre-procedure details:  ?  Reassessment: Patient reassessed immediately prior to procedure   ?  Reviewed: vital signs, relevant labs/tests and NPO status   ?  Verified: bag valve mask available, emergency equipment available, intubation equipment available, IV patency confirmed, oxygen available and suction available   ?Procedure details (see MAR for exact dosages):  ?  Preoxygenation:  Nasal cannula ?  Sedation:  Etomidate ?  Intra-procedure monitoring:  Blood pressure monitoring, cardiac monitor, continuous pulse oximetry, continuous capnometry, frequent LOC assessments and frequent vital sign checks ?  Intra-procedure events: none   ?  Intra-procedure management:  Supplemental oxygen ?  Total Provider sedation time (minutes):  10 ?Post-procedure details:  ?  Post-sedation assessment completed:  07/12/2021 1:50 PM ?  Attendance: Constant attendance by certified staff until patient recovered   ?  Recovery: Patient returned to pre-procedure baseline   ?  Post-sedation assessments completed and reviewed: post-procedure airway patency not reviewed, post-procedure cardiovascular function not reviewed, post-procedure hydration status not reviewed, post-procedure mental status not reviewed, post-procedure nausea and vomiting status not reviewed, pain score not reviewed, post-procedure respiratory function not reviewed and post-procedure temperature not reviewed   ?  Patient is stable for discharge or admission: yes   ?  Procedure completion:  Tolerated well, no immediate complications ? ?  ?Hayden Rasmussen, MD ?07/12/21 1716 ? ?

## 2021-07-12 NOTE — ED Triage Notes (Signed)
Pt presents with chest pain that started Saturday. Pt has a-fib that is controlled. Pt is also SOB.  ?

## 2021-07-12 NOTE — Discharge Instructions (Signed)
IMPORTANT INFORMATION: PAY CLOSE ATTENTION   PHYSICIAN DISCHARGE INSTRUCTIONS  Follow with Primary care provider  Vyas, Dhruv B, MD  and other consultants as instructed by your Hospitalist Physician  SEEK MEDICAL CARE OR RETURN TO EMERGENCY ROOM IF SYMPTOMS COME BACK, WORSEN OR NEW PROBLEM DEVELOPS   Please note: You were cared for by a hospitalist during your hospital stay. Every effort will be made to forward records to your primary care provider.  You can request that your primary care provider send for your hospital records if they have not received them.  Once you are discharged, your primary care physician will handle any further medical issues. Please note that NO REFILLS for any discharge medications will be authorized once you are discharged, as it is imperative that you return to your primary care physician (or establish a relationship with a primary care physician if you do not have one) for your post hospital discharge needs so that they can reassess your need for medications and monitor your lab values.  Please get a complete blood count and chemistry panel checked by your Primary MD at your next visit, and again as instructed by your Primary MD.  Get Medicines reviewed and adjusted: Please take all your medications with you for your next visit with your Primary MD  Laboratory/radiological data: Please request your Primary MD to go over all hospital tests and procedure/radiological results at the follow up, please ask your primary care provider to get all Hospital records sent to his/her office.  In some cases, they will be blood work, cultures and biopsy results pending at the time of your discharge. Please request that your primary care provider follow up on these results.  If you are diabetic, please bring your blood sugar readings with you to your follow up appointment with primary care.    Please call and make your follow up appointments as soon as possible.    Also Note  the following: If you experience worsening of your admission symptoms, develop shortness of breath, life threatening emergency, suicidal or homicidal thoughts you must seek medical attention immediately by calling 911 or calling your MD immediately  if symptoms less severe.  You must read complete instructions/literature along with all the possible adverse reactions/side effects for all the Medicines you take and that have been prescribed to you. Take any new Medicines after you have completely understood and accpet all the possible adverse reactions/side effects.   Do not drive when taking Pain medications or sleeping medications (Benzodiazepines)  Do not take more than prescribed Pain, Sleep and Anxiety Medications. It is not advisable to combine anxiety,sleep and pain medications without talking with your primary care practitioner  Special Instructions: If you have smoked or chewed Tobacco  in the last 2 yrs please stop smoking, stop any regular Alcohol  and or any Recreational drug use.  Wear Seat belts while driving.  Do not drive if taking any narcotic, mind altering or controlled substances or recreational drugs or alcohol.       

## 2021-07-12 NOTE — Care Management CC44 (Signed)
Condition Code 44 Documentation Completed ? ?Patient Details  ?Name: Brooke Luna ?MRN: [id number] ?Date of Birth: 1945-05-11 ? ? ?Condition Code 44 given:  Yes ?Patient signature on Condition Code 44 notice:  Yes ?Documentation of 2 MD's agreement:  Yes ?Code 44 added to claim:  Yes ? ? ? ?Boneta Lucks, RN ?07/12/2021, 4:46 PM ? ?

## 2021-07-12 NOTE — ED Notes (Signed)
Dr Harl Bowie at bedside. ?

## 2021-07-12 NOTE — Discharge Summary (Signed)
Physician Discharge Summary  ?Brooke Luna HYW:[id number] DOB: 1946-01-21 DOA: 07/12/2021 ? ?PCP: [name], MD ? ?Admit date: 07/12/2021 ?Discharge date: 07/12/2021 ? ?Admitted From:  Home  ?Disposition: Home  ? ?Recommendations for Outpatient Follow-up:  ?Follow up with PCP in 2 weeks ?Follow up with cardiology as scheduled  ? ?Discharge Condition: STABLE   ?CODE STATUS: FULL  ?DIET: heart healthy carb modified   ? ?Brief Hospitalization Summary: ?Please see all hospital notes, images, labs for full details of the hospitalization. ?76 y.o. female with history of atrial fibrillation, CHF, diverticulosis, GERD, obesity, and more presents the ED with a chief complaint of elevated heart rate.  Patient reports that she has had palpitations for about 5 days.  This started suddenly 5 days ago.  She reports she was not doing anything in particular, was not exerting herself.  She reports that she wears oxygen at home with exertion, but was not wearing oxygen at that time.  She monitors her pulse oximeter and saw heart rates as high as 150, oxygen saturations were staying about normal at 92%.  Patient reports when she is exerting herself her oxygen saturations dropped down to 88%, but that is normal for her.  She has not had any dizziness or chest pain.  She reports that after 2 days her heart rate stabilized 134.  She then denies chest pain but rule reports a dull ache in her chest that was unpredictable.  She still had palpitations last for couple minutes at a time.  Patient takes long-acting Cardizem and a as needed short acting Cardizem.  She reports she took for short acting Cardizem and it did nothing to help her.  Patient has no other complaints at this time. ? ?07/12/2021:  pt had successful cardioversion in ED. Discussed with Dr. Harl Bowie, can discharge home today. No changes to cardiac medication.  Cardiac follow up arranged.   ? ?HOSPITAL COURSE BY PROBLEM LIST ? ?Assessment and Plan: ?* Atrial flutter ([place]) ?Pt  had a successful cardioversion in ED and now in SR. Discussed with Dr. Harl Bowie, she can discharge home today.  Outpatient follow up with cardiology arranged.  Continue same cardiac medications unchanged.   ? ? ?Atrial flutter with rapid ventricular response ([place]) ?Briefly on cardizem IV but no response.  Treated successfully with cardioversion in ED and now in SR.  ? ?COPD with acute exacerbation ([place]) ?Continue Symbicort ?No wheeze on exam or clinical evidence of exacerbation ?Stable.  ? ?Anxiety ?Continue Klonopin ? ?Acute diastolic CHF (congestive heart failure) ([place]) ?Continue metoprolol ? ? ?Discharge Diagnoses:  ?Principal Problem: ?  Atrial flutter ([place]) ?Active Problems: ?  Anxiety ?  COPD with acute exacerbation ([place]) ?  Atrial flutter with rapid ventricular response ([place]) ? ? ?Discharge Instructions: ? ?Allergies as of 07/12/2021   ? ?   Reactions  ? Codeine Shortness Of Breath, Itching  ? Meloxicam Other (See Comments)  ? Stomach iritation  ? ?  ? ?  ?Medication List  ?  ? ?TAKE these medications   ? ?acetaminophen 325 MG tablet ?Commonly known as: TYLENOL ?Take 650 mg by mouth every 6 (six) hours as needed for moderate pain (headache). ?  ?budesonide-formoterol 160-4.5 MCG/ACT inhaler ?Commonly known as: SYMBICORT ?Inhale 2 puffs into the lungs 2 (two) times daily. ?  ?citalopram 20 MG tablet ?Commonly known as: CELEXA ?Take 20 mg by mouth daily. ?  ?clonazePAM 0.5 MG tablet ?Commonly known as: KLONOPIN ?Take 0.5 mg by mouth 2 (two) times daily as  needed for anxiety. ?  ?diltiazem 120 MG 24 hr capsule ?Commonly known as: CARDIZEM CD ?TAKE 1 CAPSULE BY MOUTH EVERY DAY ?  ?diltiazem 30 MG tablet ?Commonly known as: CARDIZEM ?Take 1 tablet (30 mg total) by mouth daily as needed (afib). ?  ?furosemide 40 MG tablet ?Commonly known as: LASIX ?Take 1 tablet (40 mg total) by mouth daily. May taken an additional 1/2 tablet (20 mg) daily as needed for weight above 263 lbs ?  ?metoprolol tartrate 25 MG  tablet ?Commonly known as: LOPRESSOR ?Take 1 tablet (25 mg total) by mouth 2 (two) times daily. ?  ?rivaroxaban 20 MG Tabs tablet ?Commonly known as: XARELTO ?Take 20 mg by mouth daily. ?  ? ?  ? ? Follow-up Information   ? ? [name], MD. Schedule an appointment as soon as possible for a visit in 1 week(s).   ?Specialty: Internal Medicine ?Why: Hospital Follow Up ?Contact information: ?[address] ?[place] [address] ?[phone] ? ? ?  ?  ? ? Arnoldo Lenis, MD .   ?Specialty: Cardiology ?Contact information: ?[address] ?[address] ?[place] [address] ?[phone] ? ? ?  ?  ? ? Thompson Grayer, MD .   ?Specialty: Cardiology ?Contact information: ?[address] ?[address] ?[place] [address] ?[phone] ? ? ?  ?  ? ?  ?  ? ?  ? ?Allergies  ?Allergen Reactions  ? Codeine Shortness Of Breath and Itching  ? Meloxicam Other (See Comments)  ?  Stomach iritation ?  ? ?Allergies as of 07/12/2021   ? ?   Reactions  ? Codeine Shortness Of Breath, Itching  ? Meloxicam Other (See Comments)  ? Stomach iritation  ? ?  ? ?  ?Medication List  ?  ? ?TAKE these medications   ? ?acetaminophen 325 MG tablet ?Commonly known as: TYLENOL ?Take 650 mg by mouth every 6 (six) hours as needed for moderate pain (headache). ?  ?budesonide-formoterol 160-4.5 MCG/ACT inhaler ?Commonly known as: SYMBICORT ?Inhale 2 puffs into the lungs 2 (two) times daily. ?  ?citalopram 20 MG tablet ?Commonly known as: CELEXA ?Take 20 mg by mouth daily. ?  ?clonazePAM 0.5 MG tablet ?Commonly known as: KLONOPIN ?Take 0.5 mg by mouth 2 (two) times daily as needed for anxiety. ?  ?diltiazem 120 MG 24 hr capsule ?Commonly known as: CARDIZEM CD ?TAKE 1 CAPSULE BY MOUTH EVERY DAY ?  ?diltiazem 30 MG tablet ?Commonly known as: CARDIZEM ?Take 1 tablet (30 mg total) by mouth daily as needed (afib). ?  ?furosemide 40 MG tablet ?Commonly known as: LASIX ?Take 1 tablet (40 mg total) by mouth daily. May taken an additional 1/2 tablet (20 mg) daily as needed  for weight above 263 lbs ?  ?metoprolol tartrate 25 MG tablet ?Commonly known as: LOPRESSOR ?Take 1 tablet (25 mg total) by mouth 2 (two) times daily. ?  ?rivaroxaban 20 MG Tabs tablet ?Commonly known as: XARELTO ?Take 20 mg by mouth daily. ?  ? ?  ? ? ?Procedures/Studies: ?DG Chest Port 1 View ? ?Result Date: 07/12/2021 ?CLINICAL DATA:  Shortness of breath EXAM: PORTABLE CHEST 1 VIEW COMPARISON:  04/22/2021 FINDINGS: There is hyperinflation of the lungs compatible with COPD. Heart is mildly enlarged. Vascular congestion. No confluent opacities, effusions or edema. No acute bony abnormality. IMPRESSION: COPD. Cardiomegaly, vascular congestion. Electronically Signed   By: Rolm Baptise M.D.   On: 07/12/2021 01:10   ?  ?Subjective: ?Pt tolerated cardioversion well.  No complaints.  No palpitations.  ? ?Discharge Exam: ?Vitals:  ? 07/12/21 1430 07/12/21 1530  ?BP: (!) 107/56 116/70  ?Pulse: 64 65  ?Resp: 18 14  ?Temp:    ?SpO2: 97% 99%  ? ?Vitals:  ? 07/12/21 1320 07/12/21 1400 07/12/21 1430 07/12/21 1530  ?BP: 126/67 123/64 (!) 107/56 116/70  ?Pulse: 71 60 64 65  ?Resp: (!) '27 20 18 14  '$ ?Temp:      ?TempSrc:      ?SpO2: 92% 96% 97% 99%  ?Weight:      ?Height:      ? ? ?General: Pt is alert, awake, not in acute distress ?Cardiovascular: RRR, S1/S2 +, no rubs, no gallops ?Respiratory: CTA bilaterally, no wheezing, no rhonchi ?Abdominal: Soft, NT, ND, bowel sounds + ?Extremities: no edema, no cyanosis ?  ?The results of significant diagnostics from this hospitalization (including imaging, microbiology, ancillary and laboratory) are listed below for reference.   ? ? ?Microbiology: ?Recent Results (from the past 240 hour(s))  ?Resp Panel by RT-PCR (Flu A&B, Covid) Nasopharyngeal Swab     Status: None  ? Collection Time: 07/12/21  3:45 AM  ? Specimen: Nasopharyngeal Swab; Nasopharyngeal(NP) swabs in vial transport medium  ?Result Value Ref Range Status  ? SARS Coronavirus 2 by RT PCR NEGATIVE NEGATIVE Final  ?  Comment:  (NOTE) ?SARS-CoV-2 target nucleic acids are NOT DETECTED. ? ?The SARS-CoV-2 RNA is generally detectable in upper respiratory ?specimens during the acute phase of infection. The lowest ?concentration of SARS-CoV-2 viral cop

## 2021-07-12 NOTE — H&P (Signed)
History and Physical    Patient: Brooke Luna WUJ:[id number] DOB: 07-07-1945 DOA: 07/12/2021 DOS: the patient was seen and examined on 07/12/2021 PCP: Ignatius Specking, MD  Patient coming from: Home  Chief Complaint:  Chief Complaint  Patient presents with   Chest Pain   HPI: Brooke Luna is a 76 y.o. female with history of atrial fibrillation, CHF, diverticulosis, GERD, obesity, and more presents the ED with a chief complaint of elevated heart rate.  Patient reports that she has had palpitations for about 5 days.  This started suddenly 5 days ago.  She reports she was not doing anything in particular, was not exerting herself.  She reports that she wears oxygen at home with exertion, but was not wearing oxygen at that time.  She monitors her pulse oximeter and saw heart rates as high as 150, oxygen saturations were staying about normal at 92%.  Patient reports when she is exerting herself her oxygen saturations dropped down to 88%, but that is normal for her.  She has not had any dizziness or chest pain.  She reports that after 2 days her heart rate stabilized 134.  She then denies chest pain but rule reports a dull ache in her chest that was unpredictable.  She still had palpitations last for couple minutes at a time.  Patient takes long-acting Cardizem and a as needed short acting Cardizem.  She reports she took for short acting Cardizem and it did nothing to help her.  Patient has no other complaints at this time.  Patient does not smoke, does not drink, is vaccinated for COVID.  Patient is full code. Review of Systems: As mentioned in the history of present illness. All other systems reviewed and are negative. Past Medical History:  Diagnosis Date   Arthritis    pain and oa  right knee and pain in rt leg and foot--also pain left knee-but right is worse   Atrial fibrillation (HCC)    Back pain    lumbar bulging disk   CHF (congestive heart failure) (HCC)    Contact dermatitis     underside of left leg--always in the same spots--comes and goes   Diverticulosis    GERD (gastroesophageal reflux disease)    diet control   Hemorrhoids    Obesity    Persistent atrial fibrillation (HCC)    Past Surgical History:  Procedure Laterality Date   ATRIAL FIBRILLATION ABLATION N/A 06/07/2019   Procedure: ATRIAL FIBRILLATION ABLATION;  Surgeon: Hillis Range, MD;  Location: [facility] INVASIVE CV LAB;  Service: Cardiovascular;  Laterality: N/A;   CARDIOVERSION N/A 05/09/2019   Procedure: CARDIOVERSION;  Surgeon: Jodelle Red, MD;  Location: [facility] ENDOSCOPY;  Service: Cardiovascular;  Laterality: N/A;   CATARACT EXTRACTION W/PHACO Left 02/22/2015   Procedure: CATARACT EXTRACTION PHACO AND INTRAOCULAR LENS PLACEMENT LEFT EYE CDE=5.41;  Surgeon: Gemma Payor, MD;  Location: [facility] ORS;  Service: Ophthalmology;  Laterality: Left;   CATARACT EXTRACTION W/PHACO Right 03/26/2015   Procedure: CATARACT EXTRACTION PHACO AND INTRAOCULAR LENS PLACEMENT (IOC);  Surgeon: Gemma Payor, MD;  Location: [facility] ORS;  Service: Ophthalmology;  Laterality: Right;  CDE:5.01   CHOLECYSTECTOMY     surgery for broken right elbow Right    TOTAL KNEE ARTHROPLASTY  11/03/2011   Procedure: TOTAL KNEE ARTHROPLASTY;  Surgeon: Loanne Drilling, MD;  Location: [facility] ORS;  Service: Orthopedics;  Laterality: Right;   TUBAL LIGATION     Social History:  reports that she quit smoking about 25 years ago. Her smoking use  included cigarettes. She started smoking about 64 years ago. She has a 38.00 pack-year smoking history. She has never used smokeless tobacco. She reports current alcohol use of about 4.0 standard drinks per week. She reports that she does not use drugs.  Allergies  Allergen Reactions   Codeine Shortness Of Breath and Itching   Meloxicam Other (See Comments)    Stomach iritation     Family History  Problem Relation Age of Onset   Diabetes Mother    Stroke Mother    Stroke Father     Prior to Admission  medications   Medication Sig Start Date End Date Taking? Authorizing Provider  acetaminophen (TYLENOL) 325 MG tablet Take 650 mg by mouth every 6 (six) hours as needed for moderate pain (headache).    [provider]  budesonide-formoterol (SYMBICORT) 160-4.5 MCG/ACT inhaler Inhale 2 puffs into the lungs 2 (two) times daily.    [provider]  citalopram (CELEXA) 20 MG tablet Take 20 mg by mouth daily. 10/28/12   [provider]  clonazePAM (KLONOPIN) 0.5 MG tablet Take 0.5 mg by mouth 2 (two) times daily as needed for anxiety.    [provider]  diltiazem (CARDIZEM CD) 120 MG 24 hr capsule TAKE 1 CAPSULE BY MOUTH EVERY DAY 06/25/21   Antoine Poche, MD  diltiazem (CARDIZEM) 30 MG tablet Take 1 tablet (30 mg total) by mouth daily as needed (afib). 07/08/21   Antoine Poche, MD  diphenhydrAMINE (BENADRYL) 25 MG tablet Take 25 mg by mouth daily as needed for allergies or sleep.     [provider]  diphenhydrAMINE-APAP, sleep, (EXCEDRIN PM) 38-500 MG TABS Take 1 tablet by mouth at bedtime as needed (sleep/pain).    [provider]  furosemide (LASIX) 40 MG tablet Take 1 tablet (40 mg total) by mouth daily. May taken an additional 1/2 tablet (20 mg) daily as needed for weight above 263 lbs 05/27/21   Antoine Poche, MD  metoprolol tartrate (LOPRESSOR) 25 MG tablet Take 1 tablet (25 mg total) by mouth 2 (two) times daily. 07/08/21   Antoine Poche, MD  rivaroxaban (XARELTO) 20 MG TABS tablet Take 20 mg by mouth daily.    [provider]    Physical Exam: Vitals:   07/12/21 0330 07/12/21 0400 07/12/21 0500 07/12/21 0530  BP: (!) 124/99 (!) 122/56 111/83 (!) 150/90  Pulse: (!) 153 (!) 148 (!) 149 (!) 51  Resp: (!) 24 (!) 22 17 14   Temp:      TempSrc:      SpO2: 91% 91% 92% 95%  Weight:      Height:       1.  General: Patient lying supine in bed,  no acute distress   2. Psychiatric: Alert and oriented x 3, mood and  behavior normal for situation, pleasant and cooperative with exam   3. Neurologic: Speech and language are normal, face is symmetric, moves all 4 extremities voluntarily, at baseline without acute deficits on limited exam   4. HEENMT:  Head is atraumatic, normocephalic, pupils reactive to light, neck is supple, trachea is midline, mucous membranes are moist   5. Respiratory : Lungs are clear to auscultation bilaterally without wheezing, rhonchi, rales, no cyanosis, no increase in work of breathing or accessory muscle use   6. Cardiovascular : Heart rate tachycardic, rhythm is irregular, no murmurs, rubs or gallops, peripheral edema present, peripheral pulses palpated   7. Gastrointestinal:  Abdomen is soft, nondistended, nontender to  palpation bowel sounds active, no masses or organomegaly palpated   8. Skin:  Skin is warm, dry and intact without rashes, bruising on left shin   9.Musculoskeletal:  No acute deformities or trauma, no asymmetry in tone, peripheral edema present, peripheral pulses palpated, no tenderness to palpation in the extremities  Data Reviewed: In the ED Temp 97.9, heart rate 138, respiratory 22, blood pressure 113/87, satting 92% Leukocytosis 13.5, hemoglobin 1.6 Chest x-ray shows cardiomegaly plus vascular congestion EKG shows a heart rate of 135, atrial tachycardia, QTc of 500 with baseline wander Cardizem 10 mg IV push initially given, no relief, patient started on Cardizem drip Admission requested for further management of heart rate  Assessment and Plan: * Atrial flutter (HCC) Heart rate bouncing between 105 and 150 No signs of withdrawal, intoxication, thyroid disease, etc. Continue Xarelto and metoprolol No ischemic changes on EKG Continue diltiazem, and Xarelto, as well as metoprolol Continue to monitor   COPD with acute exacerbation (HCC) Continue Symbicort No wheeze on exam or clinical evidence of exacerbation Continue to  monitor  Anxiety Continue Klonopin  Acute diastolic CHF (congestive heart failure) (HCC) Continue metoprolol      Advance Care Planning:   Code Status: Full Code   Consults: Cardiology  Family Communication: None  Severity of Illness: The appropriate patient status for this patient is OBSERVATION. Observation status is judged to be reasonable and necessary in order to provide the required intensity of service to ensure the patient's safety. The patient's presenting symptoms, physical exam findings, and initial radiographic and laboratory data in the context of their medical condition is felt to place them at decreased risk for further clinical deterioration. Furthermore, it is anticipated that the patient will be medically stable for discharge from the hospital within 2 midnights of admission.   Author: Lilyan Gilford, DO 07/12/2021 6:02 AM  For on call review [email or web address].

## 2021-07-12 NOTE — ED Notes (Signed)
Pt sleeping. O2 sats 88-89%. Pt states she normally wears 2L of O2 at home. Pt placed on 2L per [place] ?

## 2021-07-12 NOTE — Assessment & Plan Note (Signed)
Briefly on cardizem IV but no response.  Treated successfully with cardioversion in ED and now in SR.  ?

## 2021-07-12 NOTE — Sedation Documentation (Signed)
Pt cardioverted with 200J by Dr. Melina Copa. Pt in NSR after cardioversion.  ?

## 2021-07-12 NOTE — Assessment & Plan Note (Signed)
Continue metoprolol. 

## 2021-07-12 NOTE — Assessment & Plan Note (Signed)
-  Continue Klonopin °

## 2021-07-12 NOTE — ED Notes (Signed)
Dr Harl Bowie and Dr Wynetta Emery notified the patient on the max of cardizem at 15 and HR still 90-150's. ?

## 2021-07-12 NOTE — Assessment & Plan Note (Addendum)
Pt had a successful cardioversion in ED and now in SR. Discussed with Dr. Harl Bowie, she can discharge home today.  Outpatient follow up with cardiology arranged.  Continue same cardiac medications unchanged.   ? ?

## 2021-07-12 NOTE — Care Management Obs Status (Signed)
MEDICARE OBSERVATION STATUS NOTIFICATION ? ? ?Patient Details  ?Name: Brooke Luna ?MRN: [id number] ?Date of Birth: 1945-08-21 ? ? ?Medicare Observation Status Notification Given:  Yes ? ? ? ?Boneta Lucks, RN ?07/12/2021, 4:46 PM ?

## 2021-07-12 NOTE — Consult Note (Addendum)
?Cardiology Consultation:  ? ?Patient ID: Brooke Luna ?MRN: [id number]; DOB: 1945-11-18 ? ?Admit date: 07/12/2021 ?Date of Consult: 07/12/2021 ? ?PCP:  [name], MD ?  ?[facility] Providers ?Cardiologist:  Carlyle Dolly, MD  ?Electrophysiologist:  Thompson Grayer, MD  { ? ?Patient Profile:  ? ?Brooke Luna is a 76 y.o. 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 Clearence Ped. ? ?History of Present Illness:  ? ?Ms. Coventry was last examined by Dr. Harl Bowie in 04/2021 following a recent admission for an acute CHF exacerbation. Her weight was stable at 260 lbs and she was continued on Lasix 40 mg daily. She was in sinus rhythm at that time and was continued on Cardizem CD 120 mg daily along with Lopressor 25 mg twice daily and Xarelto 20 mg daily. She also followed up with [facility] in 05/2021 and was maintaining normal sinus rhythm and was continued on her current regimen. ? ?She presented to [facility] ED during the early morning hours of 07/12/2021 for evaluation of palpitations and shortness of breath. In talking with the patient today, she reports that she is aware she went in atrial fibrillation/flutter this past Sunday and was taking extra doses of short acting Cardizem with minimal improvement. Says that her rates were variable initially but were consistently 135 bpm on Wednesday and Thursday which prompted her to seek evaluation. She reports associated palpitations and dyspnea.  Denies any recent orthopnea or PND.  She does have chronic lower extremity edema but no acute change in symptoms. Her weight has  been stable on her home scales. She reports good compliance with her current medication regimen and denies missing any recent doses of Xarelto. ? ?Initial labs WBC 13.5, Hgb 14.6, platelets 415, Na+ 137, K+ 3.9 and creatinine 0.94. BNP 76. Initial Hs troponin 3 with repeat of 3. Negative for COVID and Influenza. TSH 4.380. CXR showing COPD and Vascular Congestion. EKG shows likely atrial flutter with RVR, HR 135 with slight ST depression along the lateral leads. ? ?She has been started on IV Cardizem which is currently at 15 mg/h and rates remain variable between the 80's to 140's. ? ?Past Medical History:  ?Diagnosis Date  ? Arthritis   ? pain and oa  right knee and pain in rt leg and foot--also pain left knee-but right is worse  ? Atrial fibrillation ([place])   ? Back pain   ? lumbar bulging disk  ? CHF (congestive heart failure) ([place])   ? Contact dermatitis   ? underside of left leg--always in the same spots--comes and goes  ? Diverticulosis   ? GERD (gastroesophageal reflux disease)   ? diet control  ? Hemorrhoids   ? Obesity   ? Persistent atrial fibrillation ([place])   ? ? ?Past Surgical History:  ?Procedure Laterality Date  ? ATRIAL FIBRILLATION ABLATION N/A 06/07/2019  ? Procedure: ATRIAL FIBRILLATION ABLATION;  Surgeon: Thompson Grayer, MD;  Location: [place] [facility];  Service: Cardiovascular;  Laterality: N/A;  ? CARDIOVERSION N/A 05/09/2019  ? Procedure: CARDIOVERSION;  Surgeon: Buford Dresser, MD;  Location: [place];  Service: Cardiovascular;  Laterality: N/A;  ? CATARACT EXTRACTION W/PHACO  Left 02/22/2015  ? Procedure: CATARACT EXTRACTION PHACO AND INTRAOCULAR LENS PLACEMENT LEFT EYE CDE=5.41;  Surgeon: Tonny Erikson Danzy, MD;  Location: [facility];  Service: Ophthalmology;  Laterality: Left;  ? CATARACT EXTRACTION W/PHACO Right 03/26/2015  ? Procedure: CATARACT EXTRACTION PHACO AND INTRAOCULAR LENS PLACEMENT (IOC);  Surgeon: Tonny Zahid Carneiro, MD;  Location: [facility];  Service: Ophthalmology;  Laterality: Right;   CDE:5.01  ? CHOLECYSTECTOMY    ? surgery for broken right elbow Right   ? TOTAL KNEE ARTHROPLASTY  11/03/2011  ? Procedure: TOTAL KNEE ARTHROPLASTY;  Surgeon: Gearlean Alf, MD;  Location: [facility];  Service: Orthopedics;  Laterality: Right;  ? TUBAL LIGATION    ?  ? ?Home Medications:  ?Prior to Admission medications   ?Medication Sig Start Date End Date Taking? Authorizing Provider  ?acetaminophen (TYLENOL) 325 MG tablet Take 650 mg by mouth every 6 (six) hours as needed for moderate pain (headache).   Yes [provider]  ?budesonide-formoterol (SYMBICORT) 160-4.5 MCG/ACT inhaler Inhale 2 puffs into the lungs 2 (two) times daily.   Yes [provider]  ?citalopram (CELEXA) 20 MG tablet Take 20 mg by mouth daily. 10/28/12  Yes [provider]  ?clonazePAM (KLONOPIN) 0.5 MG tablet Take 0.5 mg by mouth 2 (two) times daily as needed for anxiety.   Yes [provider]  ?diltiazem (CARDIZEM CD) 120 MG 24 hr capsule TAKE 1 CAPSULE BY MOUTH EVERY DAY 06/25/21  Yes Ronesha Heenan, Alphonse Guild, MD  ?diltiazem (CARDIZEM) 30 MG tablet Take 1 tablet (30 mg total) by mouth daily as needed (afib). 07/08/21  Yes BranchAlphonse Guild, MD  ?furosemide (LASIX) 40 MG tablet Take 1 tablet (40 mg total) by mouth daily. May taken an additional 1/2 tablet (20 mg) daily as needed for weight above 263 lbs 05/27/21  Yes Juri Dinning, Alphonse Guild, MD  ?metoprolol tartrate (LOPRESSOR) 25 MG tablet Take 1 tablet (25 mg total) by mouth 2 (two) times daily. 07/08/21  Yes BranchAlphonse Guild, MD  ?rivaroxaban (XARELTO) 20 MG TABS tablet Take 20 mg by mouth daily.   Yes [provider]  ? ? ?Inpatient Medications: ?Scheduled Meds: ? citalopram  20 mg Oral Daily  ? diltiazem  60 mg Oral Q6H  ? metoprolol tartrate  25 mg Oral BID  ? mometasone-formoterol  2 puff Inhalation BID  ? rivaroxaban  20 mg Oral Daily  ? ?Continuous Infusions: ? diltiazem (CARDIZEM) infusion 15 mg/hr (07/12/21 0829)  ? ?PRN Meds: ?acetaminophen **OR**  acetaminophen, clonazePAM, ondansetron **OR** ondansetron (ZOFRAN) IV, oxyCODONE, polyethylene glycol ? ?Allergies:    ?Allergies  ?Allergen Reactions  ? Codeine Shortness Of Breath and Itching  ? Meloxicam Other (See Comments)  ?  Stomach iritation ?  ? ? ?Social History:   ?Social History  ? ?Socioeconomic History  ? Marital status: Married  ?  Spouse name: Not on file  ? Number of children: Not on file  ? Years of education: Not on file  ? Highest education level: Not on file  ?Occupational History  ? Not on file  ?Tobacco Use  ? Smoking status: Former  ?  Packs/day: 1.00  ?  Years: 38.00  ?  Pack years: 38.00  ?  Types: Cigarettes  ?  Start date: 06/25/1957  ?  Quit date: 04/28/1996  ?  Years since quitting: 25.2  ? Smokeless tobacco: Never  ? Tobacco comments:  ?  quit smoking 1998  ?Vaping Use  ? Vaping Use: Never used  ?Substance and Sexual Activity  ?  Alcohol use: Yes  ?  Alcohol/week: 4.0 standard drinks  ?  Types: 4 Cans of beer per week  ?  Comment: 2 beers twice a week 06/05/21  ? Drug use: No  ? Sexual activity: Not on file  ?Other Topics Concern  ? Not on file  ?Social History Narrative  ? Lives in [place] with spouse.  ? Retired.  ? ?Social Determinants of Health  ? ?Financial Resource Strain: Not on file  ?Food Insecurity: Not on file  ?Transportation Needs: Not on file  ?Physical Activity: Not on file  ?Stress: Not on file  ?Social Connections: Not on file  ?Intimate Partner Violence: Not on file  ?  ?Family History:   ? ?Family History  ?Problem Relation Age of Onset  ? Diabetes Mother   ? Stroke Mother   ? Stroke Father   ?  ? ?ROS:  ?Please see the history of present illness.  ? ?All other ROS reviewed and negative.    ? ?Physical Exam/Data:  ? ?Vitals:  ? 07/12/21 1000 07/12/21 1009 07/12/21 1010 07/12/21 1030  ?BP: 125/79   130/66  ?Pulse: 69 (!) 120 70 68  ?Resp: (!) 21 19 (!) 22 20  ?Temp:      ?TempSrc:      ?SpO2: 90% (!) 89% 94% 96%  ?Weight:      ?Height:      ? ? ?Intake/Output Summary (Last  24 hours) at 07/12/2021 1111 ?Last data filed at 07/12/2021 0411 ?Gross per 24 hour  ?Intake 14.97 ml  ?Output --  ?Net 14.97 ml  ? ?Last 3 Weights 07/12/2021 06/05/2021 05/27/2021  ?Weight (lbs) 260 lb 265 lb 260 lb

## 2021-07-12 NOTE — Assessment & Plan Note (Addendum)
Continue Symbicort ?No wheeze on exam or clinical evidence of exacerbation ?Stable.  ?

## 2021-07-12 NOTE — ED Provider Notes (Signed)
[facility] EMERGENCY DEPARTMENT Provider Note   CSN: [id number] Arrival date & time: 07/12/21  0038     History  Chief Complaint  Patient presents with   Chest Pain    Brooke Luna is a 76 y.o. female.  Patient presents to the emergency department for evaluation of chest discomfort with shortness of breath.  Patient reports that symptoms have been ongoing today.  She gets very out of breath when she moves around.  Patient reports that she has a history of atrial fibrillation and her heart rate has been as high as 180 bpm today.      Home Medications Prior to Admission medications   Medication Sig Start Date End Date Taking? Authorizing Provider  acetaminophen (TYLENOL) 325 MG tablet Take 650 mg by mouth every 6 (six) hours as needed for moderate pain (headache).    [provider]  budesonide-formoterol (SYMBICORT) 160-4.5 MCG/ACT inhaler Inhale 2 puffs into the lungs 2 (two) times daily.    [provider]  citalopram (CELEXA) 20 MG tablet Take 20 mg by mouth daily. 10/28/12   [provider]  clonazePAM (KLONOPIN) 0.5 MG tablet Take 0.5 mg by mouth 2 (two) times daily as needed for anxiety.    [provider]  diltiazem (CARDIZEM CD) 120 MG 24 hr capsule TAKE 1 CAPSULE BY MOUTH EVERY DAY 06/25/21   Antoine Poche, MD  diltiazem (CARDIZEM) 30 MG tablet Take 1 tablet (30 mg total) by mouth daily as needed (afib). 07/08/21   Antoine Poche, MD  diphenhydrAMINE (BENADRYL) 25 MG tablet Take 25 mg by mouth daily as needed for allergies or sleep.     [provider]  diphenhydrAMINE-APAP, sleep, (EXCEDRIN PM) 38-500 MG TABS Take 1 tablet by mouth at bedtime as needed (sleep/pain).    [provider]  furosemide (LASIX) 40 MG tablet Take 1 tablet (40 mg total) by mouth daily. May taken an additional 1/2 tablet (20 mg) daily as needed for weight above 263 lbs 05/27/21   Antoine Poche, MD  metoprolol tartrate (LOPRESSOR) 25  MG tablet Take 1 tablet (25 mg total) by mouth 2 (two) times daily. 07/08/21   Antoine Poche, MD  rivaroxaban (XARELTO) 20 MG TABS tablet Take 20 mg by mouth daily.    [provider]      Allergies    Codeine and Meloxicam    Review of Systems   Review of Systems  Respiratory:  Positive for shortness of breath.   Cardiovascular:  Positive for chest pain, palpitations and leg swelling.   Physical Exam Updated Vital Signs BP 132/89   Pulse (!) 132   Temp 97.9 F (36.6 C) (Oral)   Resp 20   Ht 5\' 5"  (1.651 m)   Wt 117.9 kg   SpO2 93%   BMI 43.27 kg/m  Physical Exam Vitals and nursing note reviewed.  Constitutional:      General: She is not in acute distress.    Appearance: She is well-developed.  HENT:     Head: Normocephalic and atraumatic.     Mouth/Throat:     Mouth: Mucous membranes are moist.  Eyes:     General: Vision grossly intact. Gaze aligned appropriately.     Extraocular Movements: Extraocular movements intact.     Conjunctiva/sclera: Conjunctivae normal.  Cardiovascular:     Rate and Rhythm: Tachycardia present. Rhythm irregular.     Pulses: Normal pulses.     Heart sounds: Normal heart sounds, S1 normal  and S2 normal. No murmur heard.   No friction rub. No gallop.  Pulmonary:     Effort: Pulmonary effort is normal. Tachypnea present. No respiratory distress.     Breath sounds: Normal breath sounds.  Abdominal:     General: Bowel sounds are normal.     Palpations: Abdomen is soft.     Tenderness: There is no abdominal tenderness. There is no guarding or rebound.     Hernia: No hernia is present.  Musculoskeletal:        General: No swelling.     Cervical back: Full passive range of motion without pain, normal range of motion and neck supple. No spinous process tenderness or muscular tenderness. Normal range of motion.     Right lower leg: Edema present.     Left lower leg: Edema present.  Skin:    General: Skin is warm and dry.      Capillary Refill: Capillary refill takes less than 2 seconds.     Findings: No ecchymosis, erythema, rash or wound.  Neurological:     General: No focal deficit present.     Mental Status: She is alert and oriented to person, place, and time.     GCS: GCS eye subscore is 4. GCS verbal subscore is 5. GCS motor subscore is 6.     Cranial Nerves: Cranial nerves 2-12 are intact.     Sensory: Sensation is intact.     Motor: Motor function is intact.     Coordination: Coordination is intact.  Psychiatric:        Attention and Perception: Attention normal.        Mood and Affect: Mood normal.        Speech: Speech normal.        Behavior: Behavior normal.    ED Results / Procedures / Treatments   Labs (all labs ordered are listed, but only abnormal results are displayed) Labs Reviewed  CBC WITH DIFFERENTIAL/PLATELET - Abnormal; Notable for the following components:      Result Value   WBC 13.5 (*)    RBC 5.17 (*)    Platelets 415 (*)    Neutro Abs 9.4 (*)    Eosinophils Absolute 0.6 (*)    All other components within normal limits  COMPREHENSIVE METABOLIC PANEL - Abnormal; Notable for the following components:   Glucose, Bld 134 (*)    Calcium 8.8 (*)    AST 14 (*)    All other components within normal limits  BRAIN NATRIURETIC PEPTIDE  TROPONIN I (HIGH SENSITIVITY)    EKG EKG Interpretation  Date/Time:  Friday July 12 2021 00:47:14 EDT Ventricular Rate:  135 PR Interval:  64 QRS Duration: 99 QT Interval:  333 QTC Calculation: 500 R Axis:   63 Text Interpretation: Atrial tachycardia Low voltage, precordial leads Repolarization abnormality, prob rate related Baseline wander in lead(s) II V1 Confirmed by Gilda Crease [phone]) on 07/12/2021 1:42:00 AM  Radiology DG Chest Port 1 View  Result Date: 07/12/2021 CLINICAL DATA:  Shortness of breath EXAM: PORTABLE CHEST 1 VIEW COMPARISON:  04/22/2021 FINDINGS: There is hyperinflation of the lungs compatible with COPD.  Heart is mildly enlarged. Vascular congestion. No confluent opacities, effusions or edema. No acute bony abnormality. IMPRESSION: COPD. Cardiomegaly, vascular congestion. Electronically Signed   By: Charlett Nose M.D.   On: 07/12/2021 01:10    Procedures Procedures    Medications Ordered in ED Medications  diltiazem (CARDIZEM) 1 mg/mL load via infusion 5 mg (has  no administration in time range)    And  diltiazem (CARDIZEM) 125 mg in dextrose 5% 125 mL (1 mg/mL) infusion (has no administration in time range)  diltiazem (CARDIZEM) injection 10 mg (10 mg Intravenous Given 07/12/21 0151)    ED Course/ Medical Decision Making/ A&P                           Medical Decision Making Amount and/or Complexity of Data Reviewed External Data Reviewed: labs, radiology, ECG and notes. Labs: ordered. Decision-making details documented in ED Course. Radiology: ordered and independent interpretation performed. Decision-making details documented in ED Course. ECG/medicine tests: ordered and independent interpretation performed. Decision-making details documented in ED Course.  Risk Prescription drug management.   Patient presents to the emergency department for evaluation of chest discomfort, shortness of breath, heart palpitations and tachycardia.  Patient reports prior history of atrial fibrillation with similar symptoms.  Differential diagnosis considered includes atrial fibrillation, unstable angina, NSTEMI, congestive heart failure exacerbation  Patient with a regular tachycardia around 130 bpm at arrival.  EKG shows narrow complex, rhythm unclear.  No P waves noted, could be ectopic rhythm or a flutter.  Chest x-ray evaluated and no evidence of heart failure exacerbation.  BNP is not elevated.  Patient therefore given IV dose of Cardizem.  This reveals underlying atrial flutter with variable block but continues to go back into a fast rhythm as high as 150 bpm, consistent with a flutter with 2-1  block.  Discussed electrical cardioversion with the patient.  Benefits of electrical cardioversion include discharge without hospitalization.  Risks would be conversion to more serious arrhythmia.  Patient does not wish to pursue electrical cardioversion, reports that she normally gets put in the unit on a drip and by the morning is back in rhythm.  Patient placed on Cardizem drip and will admit to hospital.  CRITICAL CARE Performed by: Gilda Crease   Total critical care time: 35 minutes  Critical care time was exclusive of separately billable procedures and treating other patients.  Critical care was necessary to treat or prevent imminent or life-threatening deterioration.  Critical care was time spent personally by me on the following activities: development of treatment plan with patient and/or surrogate as well as nursing, discussions with consultants, evaluation of patient's response to treatment, examination of patient, obtaining history from patient or surrogate, ordering and performing treatments and interventions, ordering and review of laboratory studies, ordering and review of radiographic studies, pulse oximetry and re-evaluation of patient's condition.          Final Clinical Impression(s) / ED Diagnoses Final diagnoses:  Atrial flutter, unspecified type [facility])    Rx / DC Orders ED Discharge Orders     None         Cinderella Christoffersen, Canary Brim, MD 07/12/21 [phone]

## 2021-07-12 NOTE — Progress Notes (Signed)
?  Transition of Care (TOC) Screening Note ? ? ?Patient Details  ?Name: Brooke Luna ?Date of Birth: 1945-05-26 ? ? ?Transition of Care (TOC) CM/SW Contact:    ?Iona Beard, LCSWA ?Phone Number: ?07/12/2021, 10:15 AM ? ? ? ?Transition of Care Department [facility]) has reviewed patient and no TOC needs have been identified at this time. We will continue to monitor patient advancement through interdisciplinary progression rounds. If new patient transition needs arise, please place a TOC consult. ?  ?

## 2021-07-12 NOTE — Sedation Documentation (Signed)
Family updated as to patient's status.

## 2021-07-30 ENCOUNTER — Encounter (HOSPITAL_COMMUNITY): Payer: Self-pay | Admitting: Nurse Practitioner

## 2021-07-30 ENCOUNTER — Ambulatory Visit (HOSPITAL_COMMUNITY)
Admission: RE | Admit: 2021-07-30 | Discharge: 2021-07-30 | Disposition: A | Discharge status: Home / Self Care | Payer: Medicare Other | Source: Ambulatory Visit | Attending: Nurse Practitioner | Admitting: Nurse Practitioner

## 2021-07-30 VITALS — BP 120/64 | HR 54 | Ht 65.0 in | Wt 265.4 lb

## 2021-07-30 DIAGNOSIS — D6869 Other thrombophilia: Secondary | ICD-10-CM

## 2021-07-30 DIAGNOSIS — Z7901 Long term (current) use of anticoagulants: Secondary | ICD-10-CM | POA: Diagnosis not present

## 2021-07-30 DIAGNOSIS — E669 Obesity, unspecified: Secondary | ICD-10-CM | POA: Diagnosis not present

## 2021-07-30 DIAGNOSIS — R0602 Shortness of breath: Secondary | ICD-10-CM | POA: Diagnosis not present

## 2021-07-30 DIAGNOSIS — I2510 Atherosclerotic heart disease of native coronary artery without angina pectoris: Secondary | ICD-10-CM | POA: Insufficient documentation

## 2021-07-30 DIAGNOSIS — Z79899 Other long term (current) drug therapy: Secondary | ICD-10-CM | POA: Diagnosis not present

## 2021-07-30 DIAGNOSIS — I10 Essential (primary) hypertension: Secondary | ICD-10-CM | POA: Insufficient documentation

## 2021-07-30 DIAGNOSIS — I480 Paroxysmal atrial fibrillation: Secondary | ICD-10-CM | POA: Insufficient documentation

## 2021-07-30 NOTE — Progress Notes (Signed)
? ?Primary Care Physician: [name], MD ?Referring Physician: [facility] triage ?Cardiologist: Dr. Harl Bowie ? ? ?Brooke Luna is a 76 y.o. female with a h/o HTN, CAD, obesity as well as  paroxsymal afib since 2016. She has been scheduled for a few cardioversions but always returns to [place] before the procedure.   She is on xarelto 20 mg qd for a chadsvasc score of at least 4.  ? ?She does not smoke or drink alcohol, minimum caffeine. She does snore. She states that she sleeps in a recliner because of her back and she doesn't breath well on her back.  ? ?She in in the afib clinic today as her afib burden has increased to the point that sh is now persistent x 3 weeks and wants options to explore how to mange going forward. AAD's were discussed but pt did not want to purse. ? ?F/u in afib clinic, 05/03/19. She called to be seen for afib for the last 4-5 days. She has increased shortness of breath, and fatigue. She  would want an ablation. This would not be by guidelines to use AAD's first. She would not be an candidate for flecainide or Multaq  because of reduced  EF. She does not like the SE profile of amiodarone and Celexa may cause an issue with  qtc with Tikosyn or  Sotalol. She is not very willing to consider changing Celexa.  For the time being I will schedule cardioversion. She would like to skip antiarrythmic's and be considered for ablation. I will get her an appointment with EP after cardioversion. She is using 30 mg Cardizem every 4 hours to control v rates. She continues on xarelto for CHA2DS2VASc score of 4.  ? ?F/u in afib clinic,05/16/19. She had a successful cardioversion 05/09/19. She continues in SR and feels improved.  ? ?F/u in afib clinic, 07/05/19. She is one month s/p ablation and feels great. No afib to report.EKG shows SR. No groin or swallowing issues. Continues on xarelto 20 mg qd without interruption with a CHA2DS2VASc of 2. ? ?F/u in afib clinic, 04/12/20. She had afib in August.  This was in the setting of her husband having covid and being hospitalized and having an argument with her husband's daughter, which made her very upset. She presented to the  ER with afib with  RVR and was admitted for observation. She tested positive for covid at that time but pt states she  did not have any symptoms. She is vaccinated. No afib since that time. She has not been able to loose weight and still has some shortness of breath with walking for a  long distance, probably related to weight and inactivity.  ? ?F/u in afib clinic, 06/05/21.  This is a 3 month f/u from  Dr. Rayann Heman. She has done well maintaining  SR since ablation. She is in SR. Has not noted any afib. No afib lately to report.  ? ?F/u in afib clinic arfter a cardioversion in the ER 3/17. Pt knows no trigger. States that she was out of rhythm for around 5 days taking 30 mg Cardizem as needed before she decided to  go to the ER. She was successfully cardioverted. She remains in rhythm today.  ? ?Today, she denies symptoms of palpitations, chest pain,+ shortness of breath,- orthopnea, PND, lower extremity edema, dizziness, presyncope, syncope, or neurologic sequela. The patient is tolerating medications without difficulties and is otherwise without complaint today.  ? ?Past Medical History:  ?Diagnosis Date  ?  Arthritis   ? pain and oa  right knee and pain in rt leg and foot--also pain left knee-but right is worse  ? Atrial fibrillation ([place])   ? Back pain   ? lumbar bulging disk  ? CHF (congestive heart failure) ([place])   ? Contact dermatitis   ? underside of left leg--always in the same spots--comes and goes  ? Diverticulosis   ? GERD (gastroesophageal reflux disease)   ? diet control  ? Hemorrhoids   ? Obesity   ? Persistent atrial fibrillation ([place])   ? ?Past Surgical History:  ?Procedure Laterality Date  ? ATRIAL FIBRILLATION ABLATION N/A 06/07/2019  ? Procedure: ATRIAL FIBRILLATION ABLATION;  Surgeon: Thompson Grayer, MD;  Location: [place] [facility];  Service: Cardiovascular;  Laterality: N/A;  ? CARDIOVERSION N/A 05/09/2019  ? Procedure: CARDIOVERSION;  Surgeon: Buford Dresser, MD;  Location: [facility];  Service: Cardiovascular;  Laterality: N/A;  ? CATARACT EXTRACTION W/PHACO Left 02/22/2015  ? Procedure: CATARACT EXTRACTION PHACO AND INTRAOCULAR LENS PLACEMENT LEFT EYE CDE=5.41;  Surgeon: Tonny Branch, MD;  Location: [facility];  Service: Ophthalmology;  Laterality: Left;  ? CATARACT EXTRACTION W/PHACO Right 03/26/2015  ? Procedure: CATARACT EXTRACTION PHACO AND INTRAOCULAR LENS PLACEMENT (IOC);  Surgeon: Tonny Branch, MD;  Location: [facility];  Service: Ophthalmology;  Laterality: Right;  CDE:5.01  ? CHOLECYSTECTOMY    ? surgery for broken right elbow Right   ? TOTAL KNEE ARTHROPLASTY  11/03/2011  ? Procedure: TOTAL KNEE ARTHROPLASTY;  Surgeon: Gearlean Alf, MD;  Location: [facility];  Service: Orthopedics;  Laterality: Right;  ? TUBAL LIGATION    ? ? ?Current Outpatient Medications  ?Medication Sig Dispense Refill  ? acetaminophen (TYLENOL) 325 MG tablet Take 650 mg by mouth every 6 (six) hours as needed for moderate pain (headache).    ? budesonide-formoterol (SYMBICORT) 160-4.5 MCG/ACT inhaler Inhale 2 puffs into the lungs 2 (two) times daily.    ? citalopram (CELEXA) 20 MG tablet Take 20 mg by mouth daily.    ? clonazePAM (KLONOPIN) 0.5 MG tablet Take 0.5 mg by mouth 2 (two) times daily as needed for anxiety.    ? diltiazem (CARDIZEM CD) 120 MG 24 hr capsule TAKE 1 CAPSULE BY MOUTH EVERY DAY 30 capsule 6  ? diltiazem (CARDIZEM) 30 MG tablet Take 1 tablet (30 mg total) by mouth daily as needed (afib). 30 tablet 6  ? furosemide (LASIX) 40 MG tablet Take 1 tablet (40 mg total) by mouth daily. May taken an additional 1/2 tablet (20 mg) daily as needed for weight above 263 lbs 45 tablet 6  ? metoprolol tartrate (LOPRESSOR) 25 MG tablet Take 1 tablet (25 mg total) by mouth 2 (two) times daily. 180 tablet 6  ? rivaroxaban (XARELTO) 20 MG TABS tablet Take 20 mg  by mouth daily.    ? triamcinolone cream (KENALOG) 0.1 % Apply 1 application. topically 2 (two) times daily.    ? ?No current facility-administered medications for this encounter.  ? ? ?Allergies  ?Allergen Reactions  ? Codeine Shortness Of Breath and Itching  ? Meloxicam Other (See Comments)  ?  Stomach iritation ?  ? ? ?Social History  ? ?Socioeconomic History  ? Marital status: Married  ?  Spouse name: Not on file  ? Number of children: Not on file  ? Years of education: Not on file  ? Highest education level: Not on file  ?Occupational History  ? Not on file  ?Tobacco Use  ? Smoking status: Former  ?  Packs/day: 1.00  ?  Years: 38.00  ?  Pack years: 38.00  ?  Types: Cigarettes  ?  Start date: 06/25/1957  ?  Quit date: 04/28/1996  ?  Years since quitting: 25.2  ? Smokeless tobacco: Never  ? Tobacco comments:  ?  quit smoking 1998  ?Vaping Use  ? Vaping Use: Never used  ?Substance and Sexual Activity  ? Alcohol use: Yes  ?  Alcohol/week: 4.0 standard drinks  ?  Types: 4 Cans of beer per week  ?  Comment: 2 beers twice a week 06/05/21  ? Drug use: No  ? Sexual activity: Not on file  ?Other Topics Concern  ? Not on file  ?Social History Narrative  ? Lives in [place] with spouse.  ? Retired.  ? ?Social Determinants of Health  ? ?Financial Resource Strain: Not on file  ?Food Insecurity: Not on file  ?Transportation Needs: Not on file  ?Physical Activity: Not on file  ?Stress: Not on file  ?Social Connections: Not on file  ?Intimate Partner Violence: Not on file  ? ? ?Family History  ?Problem Relation Age of Onset  ? Diabetes Mother   ? Stroke Mother   ? Stroke Father   ? ? ?ROS- All systems are reviewed and negative except as per the HPI above ? ?Physical Exam: ?Vitals:  ? 07/30/21 1257  ?BP: 120/64  ?Pulse: (!) 54  ?Weight: 120.4 kg  ?Height: '5\' 5"'$  (1.651 m)  ? ?Wt Readings from Last 3 Encounters:  ?07/30/21 120.4 kg  ?07/12/21 117.9 kg  ?06/05/21 120.2 kg  ? ? ?Labs: ?Lab Results  ?Component Value Date  ? NA 133 (L)  07/12/2021  ? K 3.9 07/12/2021  ? CL 100 07/12/2021  ? CO2 25 07/12/2021  ? GLUCOSE 163 (H) 07/12/2021  ? BUN 23 07/12/2021  ? CREATININE 0.88 07/12/2021  ? CALCIUM 8.5 (L) 07/12/2021  ? PHOS 3.4 04/22/2021

## 2021-11-21 ENCOUNTER — Other Ambulatory Visit: Payer: Self-pay | Admitting: Cardiology

## 2021-11-25 ENCOUNTER — Encounter: Payer: Self-pay | Admitting: Cardiology

## 2021-11-25 ENCOUNTER — Ambulatory Visit: Payer: Medicare Other | Admitting: Cardiology

## 2021-11-25 ENCOUNTER — Encounter: Payer: Self-pay | Admitting: *Deleted

## 2021-11-25 VITALS — BP 118/68 | HR 66 | Ht 65.0 in | Wt 266.0 lb

## 2021-11-25 DIAGNOSIS — I10 Essential (primary) hypertension: Secondary | ICD-10-CM | POA: Diagnosis not present

## 2021-11-25 DIAGNOSIS — I480 Paroxysmal atrial fibrillation: Secondary | ICD-10-CM | POA: Diagnosis not present

## 2021-11-25 DIAGNOSIS — I5032 Chronic diastolic (congestive) heart failure: Secondary | ICD-10-CM | POA: Diagnosis not present

## 2021-11-25 NOTE — Progress Notes (Signed)
Clinical Summary Brooke Luna is a 76 y.o.female seen today for follow up of the following medical problems.    1. PAF/Aflutter - diagnosed 12/2014 during hospital admission at [facility] - followed by Dr Rayann Heman, she is s/p afib ablation 05/2019   - 11/2019 had admission with atrial flutter. Incidetnal COVID + at the time. She was rate controlled, at follow ups had converted back to SR - ER visit 07/17/20 with palpitations,EKG with aflutter with variable conduction.  - rates were able to be controlled in ER, discharged   - admit to [facility] 01/2021 initially for unintentional overdose of her calcium channel blocker - noted to be in afib with RVR at the time. Found to be RSV+, had a cough at the time.      - admit 06/2021 with aflutter with RVR, cardioverted in ER -no recent palpitations - compliant with meds. No bleeding xarelto     2. HTN - she is compliant with meds     3. CAD - nonobstructive disease by cath 2006 according to prior clinic notes - admit 09/2016 to [facility] with chest pain primarily related to palpitations.    - no recent chest pain   - 05/2019 CT cardiac prior to ablation, no significant CAD - no recent chest pain     4. SOB - chronic stable SOB. - DOE walking in from parking lot. - remote tobacco history age 64 to age 44. - has upcoming appt with Dr Melvyn Novas. Prior PFTs showed some restrictive lung disease   - admit 03/2021 with SOB - managed for acute on chronic diastolic HF and obesity hypoventilation syndrome - BNP 217, CXR pulm edema - 03/2021 echo LVEF 05-39%, indet diastolic, normal RV - home weights 260-262 lbs.Hospital weight 267 lbs, discharge ated 260 lbs.     - no recent edema.  - taking lasix '40mg'$  daily, takes addiontal '20mg'$       5. Chronic LLE   SH: enjoys travelling to casinos, usually plays the penny slots. Travellling to [place] this summer, often travels to [place].     Past Medical History:  Diagnosis Date    Arthritis    pain and oa  right knee and pain in rt leg and foot--also pain left knee-but right is worse   Atrial fibrillation ([facility])    Back pain    lumbar bulging disk   CHF (congestive heart failure) ([facility])    Contact dermatitis    underside of left leg--always in the same spots--comes and goes   Diverticulosis    GERD (gastroesophageal reflux disease)    diet control   Hemorrhoids    Obesity    Persistent atrial fibrillation ([facility])      Allergies  Allergen Reactions   Codeine Shortness Of Breath and Itching   Meloxicam Other (See Comments)    Stomach iritation      Current Outpatient Medications  Medication Sig Dispense Refill   acetaminophen (TYLENOL) 325 MG tablet Take 650 mg by mouth every 6 (six) hours as needed for moderate pain (headache).     budesonide-formoterol (SYMBICORT) 160-4.5 MCG/ACT inhaler Inhale 2 puffs into the lungs 2 (two) times daily.     citalopram (CELEXA) 20 MG tablet Take 20 mg by mouth daily.     clonazePAM (KLONOPIN) 0.5 MG tablet Take 0.5 mg by mouth 2 (two) times daily as needed for anxiety.     diltiazem (CARDIZEM CD) 120 MG 24 hr capsule TAKE 1 CAPSULE BY MOUTH EVERY  DAY 30 capsule 6   diltiazem (CARDIZEM) 30 MG tablet Take 1 tablet (30 mg total) by mouth daily as needed (afib). 30 tablet 6   furosemide (LASIX) 40 MG tablet TAKE 1 TABLET BY MOUTH DAILY. MAY TAKEN AN ADDITIONAL 1/2 TABLET DAILY AS NEEDED FOR WEIGHT >263 LBS 135 tablet 2   metoprolol tartrate (LOPRESSOR) 25 MG tablet Take 1 tablet (25 mg total) by mouth 2 (two) times daily. 180 tablet 6   rivaroxaban (XARELTO) 20 MG TABS tablet Take 20 mg by mouth daily.     triamcinolone cream (KENALOG) 0.1 % Apply 1 application. topically 2 (two) times daily.     No current facility-administered medications for this visit.     Past Surgical History:  Procedure Laterality Date   ATRIAL FIBRILLATION ABLATION N/A 06/07/2019   Procedure: ATRIAL FIBRILLATION ABLATION;  Surgeon: Thompson Grayer,  MD;  Location: [place] [facility];  Service: Cardiovascular;  Laterality: N/A;   CARDIOVERSION N/A 05/09/2019   Procedure: CARDIOVERSION;  Surgeon: Buford Dresser, MD;  Location: [facility];  Service: Cardiovascular;  Laterality: N/A;   CATARACT EXTRACTION W/PHACO Left 02/22/2015   Procedure: CATARACT EXTRACTION PHACO AND INTRAOCULAR LENS PLACEMENT LEFT EYE CDE=5.41;  Surgeon: Tonny Tykee Heideman, MD;  Location: [facility];  Service: Ophthalmology;  Laterality: Left;   CATARACT EXTRACTION W/PHACO Right 03/26/2015   Procedure: CATARACT EXTRACTION PHACO AND INTRAOCULAR LENS PLACEMENT ([place]);  Surgeon: Tonny Jerrianne Hartin, MD;  Location: [facility];  Service: Ophthalmology;  Laterality: Right;  CDE:5.01   CHOLECYSTECTOMY     surgery for broken right elbow Right    TOTAL KNEE ARTHROPLASTY  11/03/2011   Procedure: TOTAL KNEE ARTHROPLASTY;  Surgeon: Gearlean Alf, MD;  Location: [facility];  Service: Orthopedics;  Laterality: Right;   TUBAL LIGATION       Allergies  Allergen Reactions   Codeine Shortness Of Breath and Itching   Meloxicam Other (See Comments)    Stomach iritation       Family History  Problem Relation Age of Onset   Diabetes Mother    Stroke Mother    Stroke Father      Social History Brooke Luna reports that she quit smoking about 25 years ago. Her smoking use included cigarettes. She started smoking about 64 years ago. She has a 38.00 pack-year smoking history. She has never used smokeless tobacco. Brooke Luna reports current alcohol use of about 4.0 standard drinks of alcohol per week.   Review of Systems CONSTITUTIONAL: No weight loss, fever, chills, weakness or fatigue.  HEENT: Eyes: No visual loss, blurred vision, double vision or yellow sclerae.No hearing loss, sneezing, congestion, runny nose or sore throat.  SKIN: No rash or itching.  CARDIOVASCULAR: per hpi RESPIRATORY: No shortness of breath, cough or sputum.  GASTROINTESTINAL: No anorexia, nausea, vomiting or diarrhea. No  abdominal pain or blood.  GENITOURINARY: No burning on urination, no polyuria NEUROLOGICAL: No headache, dizziness, syncope, paralysis, ataxia, numbness or tingling in the extremities. No change in bowel or bladder control.  MUSCULOSKELETAL: No muscle, back pain, joint pain or stiffness.  LYMPHATICS: No enlarged nodes. No history of splenectomy.  PSYCHIATRIC: No history of depression or anxiety.  ENDOCRINOLOGIC: No reports of sweating, cold or heat intolerance. No polyuria or polydipsia.  Marland Kitchen   Physical Examination Today's Vitals   11/25/21 1437  BP: 118/68  Pulse: 66  SpO2: 91%  Weight: 266 lb (120.7 kg)  Height: '5\' 5"'$  (1.651 m)   Body mass index is 44.26 kg/m.  Gen: resting comfortably,  no acute distress HEENT: no scleral icterus, pupils equal round and reactive, no palptable cervical adenopathy,  CV: RRR, no m/r/g no jvd Resp: Clear to auscultation bilaterally GI: abdomen is soft, non-tender, non-distended, normal bowel sounds, no hepatosplenomegaly MSK: extremities are warm, no edema.  Skin: warm, no rash Neuro:  no focal deficits Psych: appropriate affect   Diagnostic Studies  12/2016 sleep study IMPRESSIONS - Minimal obstructive sleep apnea occurred during this study (AHI = 5.5/h) occurring primarily during supine sleep. - Mild central sleep apnea occurred during this study (CAI = 5.5/h). - The patient had minimal or no oxygen desaturation during the study (Min O2 = 100.00%) - The patient snored with Loud snoring volume. - EKG findings include Atrial Fibrillation. - Mild periodic limb movements of sleep occurred during the study. Associated arousals were significant.   DIAGNOSIS - Obstructive Sleep Apnea (327.23 [G47.33 ICD-10])   RECOMMENDATIONS - Very mild obstructive sleep apnea occurring primarily in supine position. - Recommend avoiding sleeping supine. - Avoid alcohol, sedatives and other CNS depressants that may worsen sleep apnea and disrupt normal sleep  architecture. - Sleep hygiene should be reviewed to assess factors that may improve sleep quality. - Weight management and regular exercise should be initiated or continued if appropriate. - Consider referral to ENT for evaluation of possible surgical causes of snoring.    11/2016 echo Study Conclusions   - Left ventricle: The cavity size was normal. Wall thickness was   normal. Systolic function was normal. The estimated ejection   fraction was in the range of 60% to 65%. Left ventricular   diastolic function parameters were normal. - Left atrium: The atrium was mildly to moderately dilated.   03/2021 echo 1. Left ventricular ejection fraction, by estimation, is 60 to 65%. The  left ventricle has normal function. The left ventricle has no regional  wall motion abnormalities. Left ventricular diastolic function could not  be evaluated.   2. Right ventricular systolic function is normal. The right ventricular  size is normal. There is normal pulmonary artery systolic pressure. The  estimated right ventricular systolic pressure is 85.4 mmHg.   3. The trivial pericardial effusion is anterior to the right ventricle.   4. The mitral valve is abnormal. Trivial mitral valve regurgitation.   5. The aortic valve is tricuspid. Aortic valve regurgitation is not  visualized.   6. The inferior vena cava is normal in size with greater than 50%  respiratory variability, suggesting right atrial pressure of 3 mmHg.    Assessment and Plan  1. PAF/Aflutter/acquired thrombophilia - prior ablation for afib, has had some subsequent episdoes of aflutter with RVR often in setting of systemic illness - doing well since most recent DCCV 06/2021, continue current meds     2. HTN - bp is at goal, continue current meds   3. Chronic diastolic HF - doing well, continue current diuretic   F/u 6 months      Arnoldo Lenis, M.D.

## 2021-11-25 NOTE — Patient Instructions (Addendum)
Medication Instructions:  Continue all current medications.   Labwork: none  Testing/Procedures: none  Follow-Up: 6 months   Any Other Special Instructions Will Be Listed Below (If Applicable).   If you need a refill on your cardiac medications before your next appointment, please call your pharmacy.  

## 2021-12-09 ENCOUNTER — Other Ambulatory Visit: Payer: Self-pay | Admitting: Cardiology

## 2021-12-11 ENCOUNTER — Encounter: Payer: Self-pay | Admitting: Cardiology

## 2022-01-31 ENCOUNTER — Ambulatory Visit: Payer: Medicare Other | Admitting: Internal Medicine

## 2022-03-11 ENCOUNTER — Telehealth: Payer: Self-pay | Admitting: Cardiology

## 2022-03-11 NOTE — Telephone Encounter (Signed)
Reports for the past week, HR has been fluctuating. Says she felt like she was going to pass out while in grocery store on yesterday after getting home, her HR was 149. Says she used her as needed diltiazem 30 mg x's 2 last Wednesday and x's 1 last night. HR now is 114 and BP 121/71. Advised to go ahead and take another diltiazem 30 mg. Offered appointment to be seen in [facility] on 11/16 and also made offer to see Swinyer NP at [facility] office tomorrow but patient declined. Says she only wants to see Dr. Harl Bowie and if her symptoms get worse, she would go to [facility]. Nurse visit scheduled for EKG tomorrow at 03/12/2022 '@10'$ :15. Aware if symptoms get worse, to go to the ED for an evaluation. Verbalized understanding of plan.

## 2022-03-11 NOTE — Telephone Encounter (Signed)
STAT if HR is under 50 or over 120 (normal HR is 60-100 beats per minute)  What is your heart rate? 114, after she woke up from a nap this afternoon.   Do you have a log of your heart rate readings (document readings)? Doesn't keep a log of her HR.  But she states, it was all over the place. Last night it was 149, 120, 70,  90, 110  Do you have any other symptoms? Patient states when her HR went up to 149 last night she felt hot and faint last night.  She states she has been getting a headache no and off all day, not bad just a little bit.

## 2022-03-12 ENCOUNTER — Ambulatory Visit: Payer: Medicare Other | Attending: Student | Admitting: Student

## 2022-03-12 ENCOUNTER — Encounter: Payer: Self-pay | Admitting: Student

## 2022-03-12 ENCOUNTER — Ambulatory Visit: Payer: Medicare Other

## 2022-03-12 VITALS — BP 132/72 | HR 71 | Ht 62.0 in | Wt 266.6 lb

## 2022-03-12 DIAGNOSIS — I2510 Atherosclerotic heart disease of native coronary artery without angina pectoris: Secondary | ICD-10-CM

## 2022-03-12 DIAGNOSIS — I480 Paroxysmal atrial fibrillation: Secondary | ICD-10-CM | POA: Diagnosis not present

## 2022-03-12 DIAGNOSIS — I10 Essential (primary) hypertension: Secondary | ICD-10-CM

## 2022-03-12 DIAGNOSIS — R600 Localized edema: Secondary | ICD-10-CM

## 2022-03-12 NOTE — Progress Notes (Signed)
Cardiology Office Note    Date:  03/12/2022   ID:  Brooke Luna, DOB Feb 07, 1946, MRN [id number]  PCP:  [name], MD  Cardiologist: Carlyle Dolly, MD    Chief Complaint  Patient presents with   Follow-up    Recent palpitations and tachycardia    History of Present Illness:    Brooke Luna is a 76 y.o. female with past medical history of paroxysmal atrial fibrillation (s/p ablation by Dr. Rayann Heman in 05/2019 with recurrence in 11/2019 in the setting of COVID-19, recurrence in 01/2021 in the setting of RSV and recurrent atrial flutter with RVR in 06/2021 and cardioverted in the Emergency Department), CAD (s/p cath in 2006 showing nonobstructive CAD, Coronary CT in 05/2019 showing minimal plaque and calcium score of 14), HTN and lower extremity edema who presents to the office today for evaluation of tachycardia and palpitations.  She was last examined by Dr. Harl Bowie in 10/2021 and denied any recent palpitations at that time. She was continued on her current cardiac medications including Xarelto '20mg'$  daily, Cardizem CD 120 mg daily, short-acting Cardizem 30 mg as needed, Lopressor 25 mg twice daily and Lasix 40 mg daily.  She called the office yesterday reporting palpitations and her heart rate had been variable from the 90's up to 140's. She did report episodes of presyncope and an appointment was offered at the [facility] but she declined. A nurse visit was initially scheduled for today but she was added on for an office visit and was advised to go to the Emergency Department in the interim if symptoms progressed.  In talking with the patient today, she reports having intermittent palpitations for the past week which resembled her prior atrial fibrillation. Reports fatigue and some presyncope during that timeframe. She did check her heart rate and it was variable as outlined above but reports her heart rate normalized this morning and she has felt like she is  back in normal sinus rhythm.  She is unaware of any precipitating events which would have triggered her episode. No recent illnesses. She did have recent labs by her PCP and was not informed of any abnormalities. No recent orthopnea or PND. She does experience intermittent lower extremity edema and takes Lasix 40 mg daily. She remains on Xarelto for anticoagulation with no reports of active bleeding.   Past Medical History:  Diagnosis Date   Arthritis    pain and oa  right knee and pain in rt leg and foot--also pain left knee-but right is worse   Atrial fibrillation (HCC)    Back pain    lumbar bulging disk   CHF (congestive heart failure) (HCC)    Contact dermatitis    underside of left leg--always in the same spots--comes and goes   Diverticulosis    GERD (gastroesophageal reflux disease)    diet control   Hemorrhoids    Obesity    Persistent atrial fibrillation ([place])     Past Surgical History:  Procedure Laterality Date   ATRIAL FIBRILLATION ABLATION N/A 06/07/2019   Procedure: ATRIAL FIBRILLATION ABLATION;  Surgeon: Thompson Grayer, MD;  Location: [facility];  Service: Cardiovascular;  Laterality: N/A;   CARDIOVERSION N/A 05/09/2019   Procedure: CARDIOVERSION;  Surgeon: Buford Dresser, MD;  Location: [facility] ENDOSCOPY;  Service: Cardiovascular;  Laterality: N/A;   CATARACT EXTRACTION W/PHACO Left 02/22/2015   Procedure: CATARACT EXTRACTION PHACO AND INTRAOCULAR LENS PLACEMENT LEFT EYE CDE=5.41;  Surgeon: Tonny Branch, MD;  Location: [facility];  Service: Ophthalmology;  Laterality: Left;   CATARACT EXTRACTION W/PHACO Right 03/26/2015   Procedure: CATARACT EXTRACTION PHACO AND INTRAOCULAR LENS PLACEMENT ([place]);  Surgeon: Tonny Branch, MD;  Location: [facility];  Service: Ophthalmology;  Laterality: Right;  CDE:5.01   CHOLECYSTECTOMY     surgery for broken right elbow Right    TOTAL KNEE ARTHROPLASTY  11/03/2011   Procedure: TOTAL KNEE ARTHROPLASTY;  Surgeon: Gearlean Alf, MD;   Location: [facility];  Service: Orthopedics;  Laterality: Right;   TUBAL LIGATION      Current Medications: Outpatient Medications Prior to Visit  Medication Sig Dispense Refill   acetaminophen (TYLENOL) 325 MG tablet Take 650 mg by mouth every 6 (six) hours as needed for moderate pain (headache).     citalopram (CELEXA) 20 MG tablet Take 20 mg by mouth daily.     clonazePAM (KLONOPIN) 0.5 MG tablet Take 0.5 mg by mouth 2 (two) times daily as needed for anxiety.     diltiazem (CARDIZEM CD) 120 MG 24 hr capsule TAKE 1 CAPSULE BY MOUTH EVERY DAY 90 capsule 2   diltiazem (CARDIZEM) 30 MG tablet Take 1 tablet (30 mg total) by mouth daily as needed (afib). 30 tablet 6   furosemide (LASIX) 40 MG tablet TAKE 1 TABLET BY MOUTH DAILY. MAY TAKEN AN ADDITIONAL 1/2 TABLET DAILY AS NEEDED FOR WEIGHT >263 LBS 135 tablet 2   metoprolol tartrate (LOPRESSOR) 25 MG tablet Take 1 tablet (25 mg total) by mouth 2 (two) times daily. 180 tablet 6   rivaroxaban (XARELTO) 20 MG TABS tablet Take 20 mg by mouth daily.     triamcinolone cream (KENALOG) 0.1 % Apply 1 application. topically 2 (two) times daily.     budesonide-formoterol (SYMBICORT) 160-4.5 MCG/ACT inhaler Inhale 2 puffs into the lungs 2 (two) times daily. (Patient not taking: Reported on 03/12/2022)     No facility-administered medications prior to visit.     Allergies:   Codeine and Meloxicam   Social History   Socioeconomic History   Marital status: Married    Spouse name: Not on file   Number of children: Not on file   Years of education: Not on file   Highest education level: Not on file  Occupational History   Not on file  Tobacco Use   Smoking status: Former    Packs/day: 1.00    Years: 38.00    Total pack years: 38.00    Types: Cigarettes    Start date: 06/25/1957    Quit date: 04/28/1996    Years since quitting: 25.8   Smokeless tobacco: Never   Tobacco comments:    quit smoking 1998  Vaping Use   Vaping Use: Never used  Substance  and Sexual Activity   Alcohol use: Yes    Alcohol/week: 4.0 standard drinks of alcohol    Types: 4 Cans of beer per week    Comment: 2 beers twice a week 06/05/21   Drug use: No   Sexual activity: Not on file  Other Topics Concern   Not on file  Social History Narrative   Lives in [place] with spouse.   Retired.   Social Determinants of Health   Financial Resource Strain: Not on file  Food Insecurity: Not on file  Transportation Needs: Not on file  Physical Activity: Not on file  Stress: Not on file  Social Connections: Not on file     Family History:  The patient's family history includes Diabetes in her mother; Stroke in her father  and mother.   Review of Systems:    Please see the history of present illness.     All other systems reviewed and are otherwise negative except as noted above.   Physical Exam:    VS:  BP 132/72   Pulse 71   Ht '5\' 2"'$  (1.575 m)   Wt 266 lb 9.6 oz (120.9 kg)   SpO2 94%   BMI 48.76 kg/m    General: Pleasant female appearing in no acute distress. Head: Normocephalic, atraumatic. Neck: No carotid bruits. JVD not elevated.  Lungs: Respirations regular and unlabored, without wheezes or rales.  Heart: Regular rate and rhythm. No S3 or S4.  No murmur, no rubs, or gallops appreciated. Abdomen: Appears non-distended. No obvious abdominal masses. Msk:  Strength and tone appear normal for age. No obvious joint deformities or effusions. Extremities: No clubbing or cyanosis.  Trace ankle edema bilaterally.  Distal pedal pulses are 2+ bilaterally. Neuro: Alert and oriented X 3. Moves all extremities spontaneously. No focal deficits noted. Psych:  Responds to questions appropriately with a normal affect. Skin: No rashes or lesions noted  Wt Readings from Last 3 Encounters:  03/12/22 266 lb 9.6 oz (120.9 kg)  11/25/21 266 lb (120.7 kg)  07/30/21 265 lb 6.4 oz (120.4 kg)     Studies/Labs Reviewed:   EKG:  EKG is ordered today. The ekg ordered today  demonstrates NSR, HR 71 with no acute ST changes.   Recent Labs: 07/12/2021: ALT 13; B Natriuretic Peptide 76.0; BUN 23; Creatinine, Ser 0.88; Hemoglobin 14.7; Magnesium 2.0; Platelets 409; Potassium 3.9; Sodium 133; TSH 4.380   Lipid Panel No results found for: "CHOL", "TRIG", "HDL", "CHOLHDL", "VLDL", "[place]", "LDLDIRECT"  Additional studies/ records that were reviewed today include:   Echocardiogram: 03/2021 IMPRESSIONS     1. Left ventricular ejection fraction, by estimation, is 60 to 65%. The  left ventricle has normal function. The left ventricle has no regional  wall motion abnormalities. Left ventricular diastolic function could not  be evaluated.   2. Right ventricular systolic function is normal. The right ventricular  size is normal. There is normal pulmonary artery systolic pressure. The  estimated right ventricular systolic pressure is 41.6 mmHg.   3. The trivial pericardial effusion is anterior to the right ventricle.   4. The mitral valve is abnormal. Trivial mitral valve regurgitation.   5. The aortic valve is tricuspid. Aortic valve regurgitation is not  visualized.   6. The inferior vena cava is normal in size with greater than 50%  respiratory variability, suggesting right atrial pressure of 3 mmHg.   Comparison(s): Changes from prior study are noted. 10/25/2019: LVEF 60-65%.   Assessment:    1. PAF (paroxysmal atrial fibrillation) ([place])   2. Coronary artery calcification seen on CT scan   3. Essential hypertension   4. Bilateral lower extremity edema      Plan:   In order of problems listed above:  1.  Paroxysmal Atrial Fibrillation - She underwent prior ablation in 05/2019 and has experienced recurrent episodes of atrial fibrillation since but usually triggered by an acute illness. By review of her symptoms and recent heart rate readings, it sounds like she was in atrial fibrillation over the past week but converted back to normal sinus rhythm.  Reviewed options with the patient as she is at high-risk for recurrence. At this time, she wishes to continue on her current medical therapy including Cardizem CD 120 mg daily and Lopressor 25 mg twice daily. We  reviewed that Cardizem CD could be further titrated but she wishes to keep this the same for now. She also has short-acting Cardizem tablets to take as needed for breakthrough episodes.Ultimately, I suspect she will need referral back to EP for consideration of antiarrhythmic therapy or possible repeat ablation.  If this is needed in the future, she prefers to follow-up in [place] as compared to the [facility]. - She remains on Xarelto 20 mg daily for anticoagulation. Labs in 11/2021 showed her hemoglobin was stable at 14.2 with platelets at 392 K.  2. CAD - Prior catheterization in 2006 showed nonobstructive CAD and Coronary CT in 05/2019 showed minimal plaque and calcium score of 14. - Her LDL was at 56 in 11/2021 and she reports she was on statin therapy at that time but did discontinue in the interim. Would continue to follow values and aim for a goal LDL less than 70.  Continue Lopressor 25 mg twice daily. She is no longer on ASA given the need for anticoagulation.  3. HTN - Her BP is well-controlled at 132/72 during today's visit. Continue current medication regimen with Cardizem CD 120 mg daily and Lopressor 25 mg twice daily. We did discuss titration of Cardizem CD as outlined above but she wishes to continue current medical therapy at this time.  4. Lower Extremity Edema - Symptoms have overall been well-controlled with Lasix 40 mg daily. Recent labs in 11/2021 showed her creatinine was stable at 0.98 and K+ was within a normal range. Continue current medical therapy.      Medication Adjustments/Labs and Tests Ordered: Current medicines are reviewed at length with the patient today.  Concerns regarding medicines are outlined above.  Medication changes, Labs and Tests  ordered today are listed in the Patient Instructions below. Patient Instructions  Medication Instructions:  Your physician recommends that you continue on your current medications as directed. Please refer to the Current Medication list given to you today.   Labwork: None  Testing/Procedures: None  Follow-Up: Keep scheduled follow up with Dr. Harl Bowie in January.   Any Other Special Instructions Will Be Listed Below (If Applicable).     If you need a refill on your cardiac medications before your next appointment, please call your pharmacy.    Signed, Erma Heritage, PA-C  03/12/2022 4:57 PM    [facility] [address] [place], [place] [address] Phone: [phone] Fax: [phone]

## 2022-03-12 NOTE — Telephone Encounter (Signed)
Agree with nursing visit for vitals and EKG, direct message me when available please  Zandra Abts MD

## 2022-03-12 NOTE — Telephone Encounter (Signed)
Appointment scheduled to see Strader today at 2:30 pm at the [place] office. Nurse visit was canceled.

## 2022-03-12 NOTE — Patient Instructions (Signed)
Medication Instructions:  Your physician recommends that you continue on your current medications as directed. Please refer to the Current Medication list given to you today.   Labwork: None  Testing/Procedures: None  Follow-Up: Keep scheduled follow up with Dr. Harl Bowie in January.   Any Other Special Instructions Will Be Listed Below (If Applicable).     If you need a refill on your cardiac medications before your next appointment, please call your pharmacy.

## 2022-04-05 DIAGNOSIS — R0902 Hypoxemia: Secondary | ICD-10-CM | POA: Insufficient documentation

## 2022-04-06 DIAGNOSIS — R739 Hyperglycemia, unspecified: Secondary | ICD-10-CM | POA: Insufficient documentation

## 2022-04-25 ENCOUNTER — Other Ambulatory Visit: Payer: Self-pay | Admitting: Cardiology

## 2022-04-25 ENCOUNTER — Other Ambulatory Visit: Payer: Self-pay

## 2022-04-25 ENCOUNTER — Emergency Department (HOSPITAL_COMMUNITY): Payer: Medicare Other

## 2022-04-25 ENCOUNTER — Observation Stay (HOSPITAL_COMMUNITY)
Admission: EM | Admit: 2022-04-25 | Discharge: 2022-04-26 | Disposition: A | Discharge status: Home / Self Care | Payer: Medicare Other | Attending: Internal Medicine | Admitting: Internal Medicine

## 2022-04-25 ENCOUNTER — Encounter (HOSPITAL_COMMUNITY): Payer: Self-pay

## 2022-04-25 DIAGNOSIS — I480 Paroxysmal atrial fibrillation: Secondary | ICD-10-CM

## 2022-04-25 DIAGNOSIS — I4819 Other persistent atrial fibrillation: Secondary | ICD-10-CM | POA: Diagnosis not present

## 2022-04-25 DIAGNOSIS — Z7901 Long term (current) use of anticoagulants: Secondary | ICD-10-CM | POA: Diagnosis not present

## 2022-04-25 DIAGNOSIS — K219 Gastro-esophageal reflux disease without esophagitis: Secondary | ICD-10-CM

## 2022-04-25 DIAGNOSIS — R002 Palpitations: Secondary | ICD-10-CM | POA: Diagnosis present

## 2022-04-25 DIAGNOSIS — Z87891 Personal history of nicotine dependence: Secondary | ICD-10-CM | POA: Insufficient documentation

## 2022-04-25 DIAGNOSIS — Z79899 Other long term (current) drug therapy: Secondary | ICD-10-CM | POA: Diagnosis not present

## 2022-04-25 DIAGNOSIS — I509 Heart failure, unspecified: Secondary | ICD-10-CM | POA: Diagnosis not present

## 2022-04-25 DIAGNOSIS — Z96651 Presence of right artificial knee joint: Secondary | ICD-10-CM | POA: Diagnosis not present

## 2022-04-25 DIAGNOSIS — I4891 Unspecified atrial fibrillation: Secondary | ICD-10-CM | POA: Diagnosis present

## 2022-04-25 DIAGNOSIS — J449 Chronic obstructive pulmonary disease, unspecified: Secondary | ICD-10-CM | POA: Diagnosis not present

## 2022-04-25 LAB — CBC
HCT: 44.8 % (ref 36.0–46.0)
Hemoglobin: 14.5 g/dL (ref 12.0–15.0)
MCH: 28.2 pg (ref 26.0–34.0)
MCHC: 32.4 g/dL (ref 30.0–36.0)
MCV: 87 fL (ref 80.0–100.0)
Platelets: 283 10*3/uL (ref 150–400)
RBC: 5.15 MIL/uL — ABNORMAL HIGH (ref 3.87–5.11)
RDW: 14.5 % (ref 11.5–15.5)
WBC: 9.7 10*3/uL (ref 4.0–10.5)
nRBC: 0 % (ref 0.0–0.2)

## 2022-04-25 LAB — BASIC METABOLIC PANEL
Anion gap: 8 (ref 5–15)
BUN: 18 mg/dL (ref 8–23)
CO2: 27 mmol/L (ref 22–32)
Calcium: 8.4 mg/dL — ABNORMAL LOW (ref 8.9–10.3)
Chloride: 101 mmol/L (ref 98–111)
Creatinine, Ser: 0.84 mg/dL (ref 0.44–1.00)
GFR, Estimated: >60 mL/min (ref >60)
Glucose, Bld: 174 mg/dL — ABNORMAL HIGH (ref 70–99)
Potassium: 3.7 mmol/L (ref 3.5–5.1)
Sodium: 136 mmol/L (ref 135–145)

## 2022-04-25 LAB — TROPONIN I (HIGH SENSITIVITY)
Troponin I (High Sensitivity): 6 ng/L (ref <18)
Troponin I (High Sensitivity): 6 ng/L (ref <18)

## 2022-04-25 LAB — TSH: TSH: 4.046 u[IU]/mL (ref 0.350–4.500)

## 2022-04-25 MED ORDER — FLECAINIDE ACETATE 50 MG PO TABS
75.0000 mg | ORAL_TABLET | Freq: Two times a day (BID) | ORAL | Status: DC
  Administered 2022-04-25 – 2022-04-26 (×3): 75 mg via ORAL
  Filled 2022-04-25 (×3): qty 2

## 2022-04-25 MED ORDER — CLONAZEPAM 0.5 MG PO TABS
0.5000 mg | ORAL_TABLET | Freq: Two times a day (BID) | ORAL | Status: DC | PRN
  Administered 2022-04-25: 0.5 mg via ORAL
  Filled 2022-04-25: qty 1

## 2022-04-25 MED ORDER — CHLORHEXIDINE GLUCONATE CLOTH 2 % EX PADS: 6.0000 | MEDICATED_PAD | Freq: Every day | CUTANEOUS | Status: DC

## 2022-04-25 MED ORDER — DILTIAZEM HCL 25 MG/5ML IV SOLN
10.0000 mg | Freq: Once | INTRAVENOUS | Status: AC
  Administered 2022-04-25: 10 mg via INTRAVENOUS
  Filled 2022-04-25: qty 5

## 2022-04-25 MED ORDER — SODIUM CHLORIDE 0.9% FLUSH: 3.0000 mL | INTRAVENOUS | Status: DC | PRN

## 2022-04-25 MED ORDER — CITALOPRAM HYDROBROMIDE 20 MG PO TABS
20.0000 mg | ORAL_TABLET | Freq: Every day | ORAL | Status: DC
  Administered 2022-04-25 – 2022-04-26 (×2): 20 mg via ORAL
  Filled 2022-04-25 (×2): qty 1

## 2022-04-25 MED ORDER — DILTIAZEM HCL-DEXTROSE 125-5 MG/125ML-% IV SOLN (PREMIX)
5.0000 mg/h | INTRAVENOUS | Status: DC
  Administered 2022-04-25: 5 mg/h via INTRAVENOUS
  Administered 2022-04-25: 12.5 mg/h via INTRAVENOUS
  Administered 2022-04-26: 5 mg/h via INTRAVENOUS
  Filled 2022-04-25 (×3): qty 125

## 2022-04-25 MED ORDER — SODIUM CHLORIDE 0.9% FLUSH
3.0000 mL | Freq: Two times a day (BID) | INTRAVENOUS | Status: DC
  Administered 2022-04-25: 3 mL via INTRAVENOUS

## 2022-04-25 MED ORDER — METOPROLOL TARTRATE 25 MG PO TABS
25.0000 mg | ORAL_TABLET | Freq: Two times a day (BID) | ORAL | Status: DC
  Administered 2022-04-25: 25 mg via ORAL
  Filled 2022-04-25: qty 1

## 2022-04-25 MED ORDER — METOPROLOL TARTRATE 50 MG PO TABS
50.0000 mg | ORAL_TABLET | Freq: Two times a day (BID) | ORAL | Status: DC
  Administered 2022-04-25 – 2022-04-26 (×2): 50 mg via ORAL
  Filled 2022-04-25 (×2): qty 1

## 2022-04-25 MED ORDER — ACETAMINOPHEN 325 MG PO TABS
650.0000 mg | ORAL_TABLET | Freq: Once | ORAL | Status: AC
  Administered 2022-04-25: 650 mg via ORAL
  Filled 2022-04-25: qty 2

## 2022-04-25 MED ORDER — ONDANSETRON HCL 4 MG/2ML IJ SOLN: 4.0000 mg | Freq: Four times a day (QID) | INTRAMUSCULAR | Status: DC | PRN

## 2022-04-25 MED ORDER — RIVAROXABAN 20 MG PO TABS
20.0000 mg | ORAL_TABLET | Freq: Every day | ORAL | Status: DC
  Administered 2022-04-25: 20 mg via ORAL
  Filled 2022-04-25: qty 1

## 2022-04-25 MED ORDER — ACETAMINOPHEN 325 MG PO TABS
650.0000 mg | ORAL_TABLET | Freq: Four times a day (QID) | ORAL | Status: DC | PRN
  Administered 2022-04-25: 650 mg via ORAL
  Filled 2022-04-25: qty 2

## 2022-04-25 MED ORDER — SODIUM CHLORIDE 0.9 % IV SOLN: 250.0000 mL | INTRAVENOUS | Status: DC | PRN

## 2022-04-25 NOTE — ED Triage Notes (Signed)
POV from home. Cc of a fib. Says that at home her rate was up to 180. Hx of the same says she has been cardioverted before.  88 on the monitor but would briefly go up to 127 and back down.

## 2022-04-25 NOTE — ED Provider Notes (Signed)
[facility] EMERGENCY DEPARTMENT Provider Note   CSN: [id number] Arrival date & time: 04/25/22  0210     History  Chief Complaint  Patient presents with   Atrial Fibrillation    Brooke Luna is a 76 y.o. female.  The history is provided by the patient.   Patient presents with concern for atrial fibrillation.  Patient reports she has a history of paroxysmal A-fib and is on Xarelto daily.  She reports for at least a week she has been back in atrial fibrillation.  She reports fatigue.  Tonight she started having heart racing and having chest tightness.  She also reports shortness of breath.  No syncope is reported.  No fevers or vomiting.  She has had mild headache. She reports previous history of cardioversion.  She reports at 1 time tonight  her heart rate was over 180    Home Medications Prior to Admission medications   Medication Sig Start Date End Date Taking? Authorizing Provider  acetaminophen (TYLENOL) 325 MG tablet Take 650 mg by mouth every 6 (six) hours as needed for moderate pain (headache).    [provider]  budesonide-formoterol (SYMBICORT) 160-4.5 MCG/ACT inhaler Inhale 2 puffs into the lungs 2 (two) times daily. Patient not taking: Reported on 03/12/2022    [provider]  citalopram (CELEXA) 20 MG tablet Take 20 mg by mouth daily. 10/28/12   [provider]  clonazePAM (KLONOPIN) 0.5 MG tablet Take 0.5 mg by mouth 2 (two) times daily as needed for anxiety.    [provider]  diltiazem (CARDIZEM CD) 120 MG 24 hr capsule TAKE 1 CAPSULE BY MOUTH EVERY DAY 12/09/21   Arnoldo Lenis, MD  diltiazem (CARDIZEM) 30 MG tablet Take 1 tablet (30 mg total) by mouth daily as needed (afib). 07/08/21   Arnoldo Lenis, MD  furosemide (LASIX) 40 MG tablet TAKE 1 TABLET BY MOUTH DAILY. MAY TAKEN AN ADDITIONAL 1/2 TABLET DAILY AS NEEDED FOR WEIGHT >263 LBS 11/21/21   Arnoldo Lenis, MD  metoprolol tartrate (LOPRESSOR) 25 MG tablet Take  1 tablet (25 mg total) by mouth 2 (two) times daily. 07/08/21   Arnoldo Lenis, MD  rivaroxaban (XARELTO) 20 MG TABS tablet Take 20 mg by mouth daily.    [provider]  triamcinolone cream (KENALOG) 0.1 % Apply 1 application. topically 2 (two) times daily. 07/17/21   [provider]      Allergies    Codeine and Meloxicam    Review of Systems   Review of Systems  Respiratory:  Positive for shortness of breath.   Cardiovascular:  Positive for chest pain and palpitations.  Neurological:  Negative for syncope.    Physical Exam Updated Vital Signs BP 131/77   Pulse 92   Temp 98 F (36.7 C) (Oral)   Resp 17   Ht 1.575 m ('5\' 2"'$ )   Wt 121 kg   SpO2 93%   BMI 48.79 kg/m  Physical Exam CONSTITUTIONAL: Elderly, no acute distress HEAD: Normocephalic/atraumatic EYES: EOMI/PERRL ENMT: Mucous membranes moist NECK: supple no meningeal signs CV: Irregular, no loud murmurs LUNGS: Lungs are clear to auscultation bilaterally, no apparent distress ABDOMEN: soft, nontender NEURO: Pt is awake/alert/appropriate, moves all extremitiesx4.  No facial droop.   EXTREMITIES: pulses normal/equal, full ROM SKIN: warm, color normal PSYCH: no abnormalities of mood noted, alert and oriented to situation  ED Results / Procedures / Treatments   Labs (all labs ordered are listed, but only abnormal results are displayed)  Labs Reviewed  BASIC METABOLIC PANEL - Abnormal; Notable for the following components:      Result Value   Glucose, Bld 174 (*)    Calcium 8.4 (*)    All other components within normal limits  CBC - Abnormal; Notable for the following components:   RBC 5.15 (*)    All other components within normal limits  TROPONIN I (HIGH SENSITIVITY)  TROPONIN I (HIGH SENSITIVITY)    EKG EKG Interpretation  Date/Time:  Friday April 25 2022 02:47:08 EST Ventricular Rate:  89 PR Interval:    QRS Duration: 101 QT Interval:  348 QTC Calculation: 424 R  Axis:   41 Text Interpretation: Atrial fibrillation Low voltage, precordial leads Borderline repolarization abnormality Confirmed by Ripley Fraise [phone]) on 04/25/2022 3:01:00 AM  Radiology DG Chest Port 1 View  Result Date: 04/25/2022 CLINICAL DATA:  Chest pain.  AFib. EXAM: PORTABLE CHEST 1 VIEW COMPARISON:  01/04/2022 FINDINGS: Stable cardiomegaly. Aortic atherosclerotic calcification. Chronic bronchitic changes. Biapical pleural-parenchymal scarring. Hyperinflation. Prominent fat pad at the cardiac apex. No definite pleural effusion. No pneumothorax. IMPRESSION: COPD.  Cardiomegaly. Electronically Signed   By: Placido Sou M.D.   On: 04/25/2022 03:53    Procedures .Critical Care  Performed by: Ripley Fraise, MD Authorized by: Ripley Fraise, MD   Critical care provider statement:    Critical care time (minutes):  60   Critical care start time:  04/25/2022 6:10 AM   Critical care end time:  04/25/2022 7:10 AM   Critical care time was exclusive of:  Separately billable procedures and treating other patients   Critical care was necessary to treat or prevent imminent or life-threatening deterioration of the following conditions:  Circulatory failure and cardiac failure   Critical care was time spent personally by me on the following activities:  Examination of patient, development of treatment plan with patient or surrogate, evaluation of patient's response to treatment, obtaining history from patient or surrogate, pulse oximetry, ordering and review of radiographic studies, ordering and review of laboratory studies, ordering and performing treatments and interventions and re-evaluation of patient's condition   I assumed direction of critical care for this patient from another provider in my specialty: no       Medications Ordered in ED Medications  diltiazem (CARDIZEM) 125 mg in dextrose 5% 125 mL (1 mg/mL) infusion (has no administration in time range)  acetaminophen  (TYLENOL) tablet 650 mg (650 mg Oral Given 04/25/22 0414)  diltiazem (CARDIZEM) injection 10 mg (10 mg Intravenous Given 04/25/22 0415)  diltiazem (CARDIZEM) injection 10 mg (10 mg Intravenous Given 04/25/22 0602)    ED Course/ Medical Decision Making/ A&P Clinical Course as of 04/25/22 0730  Fri Apr 25, 2022  0455 Glucose(!): 174 Hyperglycemia [DW]  0504 Patient overall well-appearing.  She is having runs of A-fib with RVR.  This been ongoing for at least a week.  Will give a dose of Cardizem and monitor.  Most of her chest symptoms are due to palpitations.  Will check troponin [DW]  0717 Patient now tachycardic in the 120s 130s after multiple doses of Cardizem.  Will place on Cardizem drip and admit to the hospitalist [DW]  0725 Patient with worsening symptoms palpitations and chest tightness likely due to her A-fib.  She reports it has been ongoing for at least a week but worse over the past 24 hours. [DW]  9678 Received sign out pending admission for Afib. Initially improved with Cardizem injection but having persistent tachycardia requiring drip [WS]  0730 Signed out to dr Truett Mainland at shift change to call report.  Pt has been considered likely needed EP referral and ablation [DW]    Clinical Course User Index [DW] Ripley Fraise, MD [WS] Cristie Hem, MD                           Medical Decision Making Amount and/or Complexity of Data Reviewed Labs: ordered. Decision-making details documented in ED Course.  Risk OTC drugs. Prescription drug management. Decision regarding hospitalization.   This patient presents to the ED for concern of atrial fibrillation and chest pain, this involves an extensive number of treatment options, and is a complaint that carries with it a high risk of complications and morbidity.  The differential diagnosis includes but is not limited to acute coronary syndrome, aortic dissection, pulmonary embolism, pericarditis, pneumothorax, pneumonia,  myocarditis, pleurisy, esophageal rupture    Comorbidities that complicate the patient evaluation: Patient's presentation is complicated by their history of CHF, afib  Social Determinants of Health: Patient's  former smoker status   increases the complexity of managing their presentation  Additional history obtained: Records reviewed Care Everywhere/External Records  Lab Tests: I Ordered, and personally interpreted labs.  The pertinent results include: Hyperglycemia  Imaging Studies ordered: I ordered imaging studies including X-ray chest   I independently visualized and interpreted imaging which showed no acute findings I agree with the radiologist interpretation  Cardiac Monitoring: The patient was maintained on a cardiac monitor.  I personally viewed and interpreted the cardiac monitor which showed an underlying rhythm of:  Atrial Fibrillation  Medicines ordered and prescription drug management: I ordered medication including Cardizem for atrial fibrillation Reevaluation of the patient after these medicines showed that the patient    stayed the same  Critical Interventions:  IV cardizem  Reevaluation: After the interventions noted above, I reevaluated the patient and found that they have :stayed the same  Complexity of problems addressed: Patient's presentation is most consistent with  acute presentation with potential threat to life or bodily function  Disposition: After consideration of the diagnostic results and the patient's response to treatment,  I feel that the patent would benefit from admission   .           Final Clinical Impression(s) / ED Diagnoses Final diagnoses:  Atrial fibrillation with rapid ventricular response [facility])    Rx / DC Orders ED Discharge Orders     None         Ripley Fraise, MD 04/25/22 0730

## 2022-04-25 NOTE — Consult Note (Signed)
CARDIOLOGY CONSULT NOTE       Patient ID: Brooke Luna MRN: [id number] DOB/AGE: Mar 17, 1946 76 y.o.  Admit date: 04/25/2022 Referring Physician: Pokhrel Primary Physician: [name], MD Primary Cardiologist: Branch/Allred Reason for Consultation: Afib  Principal Problem:   Atrial fibrillation with rapid ventricular response (HCC) Active Problems:   Persistent atrial fibrillation (HCC)   CHF (congestive heart failure) (HCC)   GERD (gastroesophageal reflux disease)   HPI:  76 y.o. seen in ED for rapid afib. Long history of PAF Ablation with Dr Rayann Heman 05/2019 with multiple recurrences with last [facility] in ER March 2023. Seen in ER Christmas week and converted with cardizem. Has had palpitations with dyspnea last week In ER rapid afib rates 120-140 bpm On cardizem drip with better control 90's now. No chest pain Cath 2006 with no CAD and coronary CT 05/2019 with minimal plaque calcium score 14.  TTE 04/23/21 with normal EF no significant valve dx moderate LAE 44 mm and no significant LVH.  Last seen by Bernerd Pho PA 03/12/22 and discussed likely need for AAT or repeat ablation. She is on Xarelto chronically with no missed doses. Denies SSCP, syncope or recent illness Covid negative at [facility] last 2 weeks.   ROS All other systems reviewed and negative except as noted above  Past Medical History:  Diagnosis Date   Arthritis    pain and oa  right knee and pain in rt leg and foot--also pain left knee-but right is worse   Atrial fibrillation (HCC)    Back pain    lumbar bulging disk   CHF (congestive heart failure) (HCC)    Contact dermatitis    underside of left leg--always in the same spots--comes and goes   Diverticulosis    GERD (gastroesophageal reflux disease)    diet control   Hemorrhoids    Obesity    Persistent atrial fibrillation ([place])     Family History  Problem Relation Age of Onset   Diabetes Mother    Stroke Mother    Stroke Father     Social History    Socioeconomic History   Marital status: Married    Spouse name: Not on file   Number of children: Not on file   Years of education: Not on file   Highest education level: Not on file  Occupational History   Not on file  Tobacco Use   Smoking status: Former    Packs/day: 1.00    Years: 38.00    Total pack years: 38.00    Types: Cigarettes    Start date: 06/25/1957    Quit date: 04/28/1996    Years since quitting: 26.0   Smokeless tobacco: Never   Tobacco comments:    quit smoking 1998  Vaping Use   Vaping Use: Never used  Substance and Sexual Activity   Alcohol use: Yes    Alcohol/week: 4.0 standard drinks of alcohol    Types: 4 Cans of beer per week    Comment: 2 beers twice a week 06/05/21   Drug use: No   Sexual activity: Not on file  Other Topics Concern   Not on file  Social History Narrative   Lives in [place] with spouse.   Retired.   Social Determinants of Health   Financial Resource Strain: Not on file  Food Insecurity: Not on file  Transportation Needs: Not on file  Physical Activity: Not on file  Stress: Not on file  Social Connections: Not on file  Intimate Partner  Violence: Not on file    Past Surgical History:  Procedure Laterality Date   ATRIAL FIBRILLATION ABLATION N/A 06/07/2019   Procedure: ATRIAL FIBRILLATION ABLATION;  Surgeon: Thompson Grayer, MD;  Location: [place] [facility];  Service: Cardiovascular;  Laterality: N/A;   CARDIOVERSION N/A 05/09/2019   Procedure: CARDIOVERSION;  Surgeon: Buford Dresser, MD;  Location: [facility] ENDOSCOPY;  Service: Cardiovascular;  Laterality: N/A;   CATARACT EXTRACTION W/PHACO Left 02/22/2015   Procedure: CATARACT EXTRACTION PHACO AND INTRAOCULAR LENS PLACEMENT LEFT EYE CDE=5.41;  Surgeon: Tonny Branch, MD;  Location: [facility];  Service: Ophthalmology;  Laterality: Left;   CATARACT EXTRACTION W/PHACO Right 03/26/2015   Procedure: CATARACT EXTRACTION PHACO AND INTRAOCULAR LENS PLACEMENT ([place]);  Surgeon: Tonny Branch, MD;   Location: [facility];  Service: Ophthalmology;  Laterality: Right;  CDE:5.01   CHOLECYSTECTOMY     surgery for broken right elbow Right    TOTAL KNEE ARTHROPLASTY  11/03/2011   Procedure: TOTAL KNEE ARTHROPLASTY;  Surgeon: Gearlean Alf, MD;  Location: [facility];  Service: Orthopedics;  Laterality: Right;   TUBAL LIGATION        Current Facility-Administered Medications:    0.9 %  sodium chloride infusion, 250 mL, Intravenous, PRN, Pokhrel, Laxman, MD   acetaminophen (TYLENOL) tablet 650 mg, 650 mg, Oral, Q6H PRN, Pokhrel, Laxman, MD   citalopram (CELEXA) tablet 20 mg, 20 mg, Oral, Daily, Pokhrel, Laxman, MD   clonazePAM (KLONOPIN) tablet 0.5 mg, 0.5 mg, Oral, BID PRN, Pokhrel, Laxman, MD   diltiazem (CARDIZEM) 125 mg in dextrose 5% 125 mL (1 mg/mL) infusion, 5-15 mg/hr, Intravenous, Continuous, Pokhrel, Laxman, MD, Last Rate: 10 mL/hr at 04/25/22 1018, 10 mg/hr at 04/25/22 1018   metoprolol tartrate (LOPRESSOR) tablet 25 mg, 25 mg, Oral, BID, Pokhrel, Laxman, MD, 25 mg at 04/25/22 1140   ondansetron (ZOFRAN) injection 4 mg, 4 mg, Intravenous, Q6H PRN, Pokhrel, Laxman, MD   rivaroxaban (XARELTO) tablet 20 mg, 20 mg, Oral, q1800, Pokhrel, Laxman, MD   sodium chloride flush (NS) 0.9 % injection 3 mL, 3 mL, Intravenous, Q12H, Pokhrel, Laxman, MD   sodium chloride flush (NS) 0.9 % injection 3 mL, 3 mL, Intravenous, PRN, Pokhrel, Laxman, MD  Current Outpatient Medications:    acetaminophen (TYLENOL) 325 MG tablet, Take 650 mg by mouth every 6 (six) hours as needed for moderate pain or headache., Disp: , Rfl:    citalopram (CELEXA) 20 MG tablet, Take 20 mg by mouth daily., Disp: , Rfl:    clonazePAM (KLONOPIN) 0.5 MG tablet, Take 0.5 mg by mouth 2 (two) times daily as needed for anxiety., Disp: , Rfl:    diltiazem (CARDIZEM CD) 120 MG 24 hr capsule, TAKE 1 CAPSULE BY MOUTH EVERY DAY (Patient taking differently: Take 120 mg by mouth daily.), Disp: 90 capsule, Rfl: 2   furosemide (LASIX) 40 MG tablet,  TAKE 1 TABLET BY MOUTH DAILY. MAY TAKEN AN ADDITIONAL 1/2 TABLET DAILY AS NEEDED FOR WEIGHT >263 LBS (Patient taking differently: Take 40 mg by mouth daily. May take an additional '20mg'$  daily as needed for weight >263 lbs.), Disp: 135 tablet, Rfl: 2   ibuprofen (ADVIL) 200 MG tablet, Take 200 mg by mouth every 6 (six) hours as needed for fever, headache or mild pain., Disp: , Rfl:    metoprolol tartrate (LOPRESSOR) 25 MG tablet, Take 1 tablet (25 mg total) by mouth 2 (two) times daily., Disp: 180 tablet, Rfl: 6   rivaroxaban (XARELTO) 20 MG TABS tablet, Take 20 mg by mouth daily., Disp: ,  Rfl:    diltiazem (CARDIZEM) 30 MG tablet, Take 1 tablet (30 mg total) by mouth daily as needed (afib)., Disp: 30 tablet, Rfl: 6  citalopram  20 mg Oral Daily   metoprolol tartrate  25 mg Oral BID   rivaroxaban  20 mg Oral q1800   sodium chloride flush  3 mL Intravenous Q12H    sodium chloride     diltiazem (CARDIZEM) infusion 10 mg/hr (04/25/22 1018)    Physical Exam: Blood pressure 120/84, pulse 86, temperature 97.9 F (36.6 C), temperature source Oral, resp. rate (!) 23, height '5\' 2"'$  (1.575 m), weight 121 kg, SpO2 94 %.   Obese female COPD mild dyspnea/tachypnea just got back from bathroom Distant heart sounds no murmur Lungs clear Abdomen benign Trace edema   Labs:   Lab Results  Component Value Date   WBC 9.7 04/25/2022   HGB 14.5 04/25/2022   HCT 44.8 04/25/2022   MCV 87.0 04/25/2022   PLT 283 04/25/2022    Recent Labs  Lab 04/25/22 0300  NA 136  K 3.7  CL 101  CO2 27  BUN 18  CREATININE 0.84  CALCIUM 8.4*  GLUCOSE 174*   Lab Results  Component Value Date   TROPONINI <0.03 10/11/2017   No results found for: "CHOL" No results found for: "HDL" No results found for: "LDLCALC" No results found for: "TRIG" No results found for: "CHOLHDL" No results found for: "LDLDIRECT"    Radiology: [facility] Chest Port 1 View  Result Date: 04/25/2022 CLINICAL DATA:  Chest pain.  AFib. EXAM:  PORTABLE CHEST 1 VIEW COMPARISON:  01/04/2022 FINDINGS: Stable cardiomegaly. Aortic atherosclerotic calcification. Chronic bronchitic changes. Biapical pleural-parenchymal scarring. Hyperinflation. Prominent fat pad at the cardiac apex. No definite pleural effusion. No pneumothorax. IMPRESSION: COPD.  Cardiomegaly. Electronically Signed   By: Placido Sou M.D.   On: 04/25/2022 03:53    EKG: afib rate 89 nonspecific ST changes    ASSESSMENT AND PLAN:   PAF:  Needs f/u with EP to discuss repeat ablation As a bridge to this start flecainide 75 mg bid. She has normal EF no significant CAD and no significant LVH. Increase lopressor to 50 mg bid Continue iv cardizem today and transition to PO in am. Continue xarelto ECG daily in am as starting AAT with flecainide K 3.9 and normal renal function  COPD:  dyspnea with exertion no pulmonary HTN on echo Denies smoking history but worked in [employer] with lots of chemicals CXR NAD Anticoagulation: Xarelto no missed doses No bleeding issues Nurse to give dose today now  Signed: Jenkins Rouge 04/25/2022, 12:03 PM

## 2022-04-25 NOTE — H&P (Signed)
[facility] History and Physical  Brooke Luna WJX:[id number] DOB: 1945/11/09 DOA: 04/25/2022  Referring physician: ED  PCP: [name], MD   Patient is coming from: Home  Chief Complaint: Palpitations  HPI: Patient is 76 years old female with past medical history of persistent atrial fibrillation presented to hospital with palpitation chest tightness and fatigue with shortness of breath.  Patient had noted palpitations for the last 1 week or so.  Denies any dizziness or syncope, headache lightheadedness.  Denied any fever chills or rigor.  Denies any cough congestion or throat pain.  Denies any nausea vomiting diarrhea or abdominal pain.  Denies any urinary urgency frequency or dysuria.  In the, ED, patient was noted to have atrial fibrillation with rapid ventricular response.  She was given 2 doses of 10 mg IV Cardizem push without much response so was started on Cardizem drip and considered for admission to the hospital.  Of note patient does have a history of cardioversion in the past and follows up with Dr. Harl Bowie as outpatient.  Assessment and Plan  Atrial fibrillation with rapid ventricular response.  History of persistent atrial fibrillation.  Follows up with cardiology as outpatient.  Symptomatic at this time.  Patient received Cardizem bolus in the ED will continue with Cardizem drip.  Will consult cardiology.  Patient did have a 2D echocardiogram done 1222 with LV ejection fraction of 60 to 65%.  Will repeat 2D echocardiogram when heart rate is better.  History of cardioversion in the past.  Patient was on 120 mg long-acting and 30 mg as needed Cardizem at home.  Continue Xarelto from home for anticoagulation.  TSH on 06/2021 at 4.3.  Will repeat.  History of congestive heart failure Currently compensated.  Continue intake and output charting Daily weights.  Closely monitor.  Check 2D echocardiogram.  History of GERD Add PPI.  DVT Prophylaxis: On Xarelto.  Review  of Systems:  All systems were reviewed and were negative unless otherwise mentioned in the HPI   Past Medical History:  Diagnosis Date   Arthritis    pain and oa  right knee and pain in rt leg and foot--also pain left knee-but right is worse   Atrial fibrillation (HCC)    Back pain    lumbar bulging disk   CHF (congestive heart failure) (HCC)    Contact dermatitis    underside of left leg--always in the same spots--comes and goes   Diverticulosis    GERD (gastroesophageal reflux disease)    diet control   Hemorrhoids    Obesity    Persistent atrial fibrillation ([place])    Past Surgical History:  Procedure Laterality Date   ATRIAL FIBRILLATION ABLATION N/A 06/07/2019   Procedure: ATRIAL FIBRILLATION ABLATION;  Surgeon: Thompson Grayer, MD;  Location: [place] [facility];  Service: Cardiovascular;  Laterality: N/A;   CARDIOVERSION N/A 05/09/2019   Procedure: CARDIOVERSION;  Surgeon: Buford Dresser, MD;  Location[facility] ENDOSCOPY;  Service: Cardiovascular;  Laterality: N/A;   CATARACT EXTRACTION W/PHACO Left 02/22/2015   Procedure: CATARACT EXTRACTION PHACO AND INTRAOCULAR LENS PLACEMENT LEFT EYE CDE=5.41;  Surgeon: Tonny Branch, MD;  Location: [facility];  Service: Ophthalmology;  Laterality: Left;   CATARACT EXTRACTION W/PHACO Right 03/26/2015   Procedure: CATARACT EXTRACTION PHACO AND INTRAOCULAR LENS PLACEMENT ([place]);  Surgeon: Tonny Branch, MD;  Location: [facility];  Service: Ophthalmology;  Laterality: Right;  CDE:5.01   CHOLECYSTECTOMY     surgery for broken right elbow Right    TOTAL KNEE ARTHROPLASTY  11/03/2011   Procedure: TOTAL KNEE ARTHROPLASTY;  Surgeon: Gearlean Alf, MD;  Location: [facility];  Service: Orthopedics;  Laterality: Right;   TUBAL LIGATION      Social History:  reports that she quit smoking about 26 years ago. Her smoking use included cigarettes. She started smoking about 64 years ago. She has a 38.00 pack-year smoking history. She has never used smokeless tobacco. She  reports current alcohol use of about 4.0 standard drinks of alcohol per week. She reports that she does not use drugs.  Allergies  Allergen Reactions   Codeine Shortness Of Breath and Itching   Meloxicam Other (See Comments)    Stomach iritation     Family History  Problem Relation Age of Onset   Diabetes Mother    Stroke Mother    Stroke Father      Prior to Admission medications   Medication Sig Start Date End Date Taking? Authorizing Provider  acetaminophen (TYLENOL) 325 MG tablet Take 650 mg by mouth every 6 (six) hours as needed for moderate pain (headache).    [provider]  budesonide-formoterol (SYMBICORT) 160-4.5 MCG/ACT inhaler Inhale 2 puffs into the lungs 2 (two) times daily. Patient not taking: Reported on 03/12/2022    [provider]  citalopram (CELEXA) 20 MG tablet Take 20 mg by mouth daily. 10/28/12   [provider]  clonazePAM (KLONOPIN) 0.5 MG tablet Take 0.5 mg by mouth 2 (two) times daily as needed for anxiety.    [provider]  diltiazem (CARDIZEM CD) 120 MG 24 hr capsule TAKE 1 CAPSULE BY MOUTH EVERY DAY 12/09/21   Arnoldo Lenis, MD  diltiazem (CARDIZEM) 30 MG tablet Take 1 tablet (30 mg total) by mouth daily as needed (afib). 07/08/21   Arnoldo Lenis, MD  furosemide (LASIX) 40 MG tablet TAKE 1 TABLET BY MOUTH DAILY. MAY TAKEN AN ADDITIONAL 1/2 TABLET DAILY AS NEEDED FOR WEIGHT >263 LBS 11/21/21   Arnoldo Lenis, MD  metoprolol tartrate (LOPRESSOR) 25 MG tablet Take 1 tablet (25 mg total) by mouth 2 (two) times daily. 07/08/21   Arnoldo Lenis, MD  rivaroxaban (XARELTO) 20 MG TABS tablet Take 20 mg by mouth daily.    [provider]  triamcinolone cream (KENALOG) 0.1 % Apply 1 application. topically 2 (two) times daily. 07/17/21   [provider]    Physical Exam: Vitals:   04/25/22 0615 04/25/22 0630 04/25/22 0730 04/25/22 0745  BP: (!) 133/55 131/77 (!) 127/90 (!) 136/52  Pulse: 90 92  96 63  Resp: (!) '21 17 13 18  '$ Temp:  98 F (36.7 C)    TempSrc:  Oral    SpO2: 92% 93% 97% 97%  Weight:      Height:       Wt Readings from Last 3 Encounters:  04/25/22 121 kg  03/12/22 120.9 kg  11/25/21 120.7 kg   Body mass index is 48.79 kg/m.  General: Morbidly obese built, not in obvious distress HENT: Normocephalic, No scleral pallor or icterus noted. Oral mucosa is moist.  Chest:  Clear breath sounds.  . No crackles or wheezes.  CVS: S1 &S2 heard. No murmur.  Irregularly irregular rhythm with tachycardia. Abdomen: Soft, nontender, nondistended.  Bowel sounds are heard. No abdominal mass palpated Extremities: No cyanosis, clubbing or edema.  Peripheral pulses are palpable. Psych: Alert, awake and oriented, normal mood CNS:  No cranial nerve deficits.  Power equal in all extremities.   Skin: Warm and dry.  No rashes noted.  Labs on Admission:   CBC: Recent Labs  Lab 04/25/22 0300  WBC 9.7  HGB 14.5  HCT 44.8  MCV 87.0  PLT 407    Basic Metabolic Panel: Recent Labs  Lab 04/25/22 0300  NA 136  K 3.7  CL 101  CO2 27  GLUCOSE 174*  BUN 18  CREATININE 0.84  CALCIUM 8.4*    Liver Function Tests: No results for input(s): "AST", "ALT", "ALKPHOS", "BILITOT", "PROT", "ALBUMIN" in the last 168 hours. No results for input(s): "LIPASE", "AMYLASE" in the last 168 hours. No results for input(s): "AMMONIA" in the last 168 hours.  Cardiac Enzymes: No results for input(s): "CKTOTAL", "CKMB", "CKMBINDEX", "TROPONINI" in the last 168 hours.  BNP (last 3 results) Recent Labs    07/12/21 0058  BNP 76.0    ProBNP (last 3 results) No results for input(s): "PROBNP" in the last 8760 hours.  CBG: No results for input(s): "GLUCAP" in the last 168 hours.  Lipase  No results found for: "LIPASE"   Urinalysis    Component Value Date/Time   COLORURINE YELLOW 07/12/2021 0740   APPEARANCEUR CLEAR 07/12/2021 0740   LABSPEC 1.016 07/12/2021 0740   PHURINE 5.0  07/12/2021 0740   GLUCOSEU NEGATIVE 07/12/2021 0740   HGBUR SMALL (A) 07/12/2021 0740   BILIRUBINUR NEGATIVE 07/12/2021 0740   KETONESUR NEGATIVE 07/12/2021 0740   PROTEINUR NEGATIVE 07/12/2021 0740   UROBILINOGEN 0.2 10/23/2011 1340   NITRITE NEGATIVE 07/12/2021 0740   LEUKOCYTESUR NEGATIVE 07/12/2021 0740     Drugs of Abuse  No results found for: "LABOPIA", "COCAINSCRNUR", "LABBENZ", "AMPHETMU", "THCU", "LABBARB"    Radiological Exams on Admission: DG Chest Port 1 View  Result Date: 04/25/2022 CLINICAL DATA:  Chest pain.  AFib. EXAM: PORTABLE CHEST 1 VIEW COMPARISON:  01/04/2022 FINDINGS: Stable cardiomegaly. Aortic atherosclerotic calcification. Chronic bronchitic changes. Biapical pleural-parenchymal scarring. Hyperinflation. Prominent fat pad at the cardiac apex. No definite pleural effusion. No pneumothorax. IMPRESSION: COPD.  Cardiomegaly. Electronically Signed   By: Placido Sou M.D.   On: 04/25/2022 03:53    EKG: Personally reviewed by me which shows atrial fibrillation with RVR.   Consultant: Cardiology  Code Status: Full code  Microbiology none  Antibiotics: None  Family Communication:  Patients' condition and plan of care including tests being ordered have been discussed with the patient  and the spouse who indicate understanding and agree with the plan.   Status is: Observation The patient remains OBS appropriate and will d/c before 2 midnights.   Severity of Illness: The appropriate patient status for this patient is OBSERVATION. Observation status is judged to be reasonable and necessary in order to provide the required intensity of service to ensure the patient's safety. The patient's presenting symptoms, physical exam findings, and initial radiographic and laboratory data in the context of their medical condition is felt to place them at decreased risk for further clinical deterioration. Furthermore, it is anticipated that the patient will be medically  stable for discharge from the hospital within 2 midnights of admission.   Signed, Flora Lipps, MD [facility] 04/25/2022

## 2022-04-26 ENCOUNTER — Observation Stay (HOSPITAL_BASED_OUTPATIENT_CLINIC_OR_DEPARTMENT_OTHER): Payer: Medicare Other

## 2022-04-26 ENCOUNTER — Encounter (HOSPITAL_COMMUNITY): Payer: Self-pay | Admitting: Physical Therapy

## 2022-04-26 DIAGNOSIS — I4891 Unspecified atrial fibrillation: Secondary | ICD-10-CM

## 2022-04-26 DIAGNOSIS — I509 Heart failure, unspecified: Secondary | ICD-10-CM | POA: Diagnosis not present

## 2022-04-26 DIAGNOSIS — K219 Gastro-esophageal reflux disease without esophagitis: Secondary | ICD-10-CM | POA: Diagnosis not present

## 2022-04-26 DIAGNOSIS — I4819 Other persistent atrial fibrillation: Secondary | ICD-10-CM | POA: Diagnosis not present

## 2022-04-26 LAB — CBC
HCT: 45.5 % (ref 36.0–46.0)
Hemoglobin: 14.1 g/dL (ref 12.0–15.0)
MCH: 27.6 pg (ref 26.0–34.0)
MCHC: 31 g/dL (ref 30.0–36.0)
MCV: 89.2 fL (ref 80.0–100.0)
Platelets: 302 10*3/uL (ref 150–400)
RBC: 5.1 MIL/uL (ref 3.87–5.11)
RDW: 14.6 % (ref 11.5–15.5)
WBC: 10.4 10*3/uL (ref 4.0–10.5)
nRBC: 0 % (ref 0.0–0.2)

## 2022-04-26 LAB — ECHOCARDIOGRAM COMPLETE
Area-P 1/2: 3.51 cm2
Height: 62 in
S' Lateral: 3.1 cm
Single Plane A4C EF: 70.4 %
Weight: 4268.11 oz

## 2022-04-26 LAB — MRSA NEXT GEN BY PCR, NASAL: MRSA by PCR Next Gen: NOT DETECTED

## 2022-04-26 LAB — BASIC METABOLIC PANEL
Anion gap: 8 (ref 5–15)
BUN: 16 mg/dL (ref 8–23)
CO2: 28 mmol/L (ref 22–32)
Calcium: 8.9 mg/dL (ref 8.9–10.3)
Chloride: 102 mmol/L (ref 98–111)
Creatinine, Ser: 0.94 mg/dL (ref 0.44–1.00)
GFR, Estimated: >60 mL/min (ref >60)
Glucose, Bld: 186 mg/dL — ABNORMAL HIGH (ref 70–99)
Potassium: 4.6 mmol/L (ref 3.5–5.1)
Sodium: 138 mmol/L (ref 135–145)

## 2022-04-26 LAB — LIPID PANEL
Cholesterol: 173 mg/dL (ref 0–200)
HDL: 53 mg/dL (ref >40)
LDL Cholesterol: 85 mg/dL (ref 0–99)
Total CHOL/HDL Ratio: 3.3 RATIO
Triglycerides: 175 mg/dL — ABNORMAL HIGH (ref <150)
VLDL: 35 mg/dL (ref 0–40)

## 2022-04-26 LAB — MAGNESIUM: Magnesium: 2.2 mg/dL (ref 1.7–2.4)

## 2022-04-26 MED ORDER — METOPROLOL TARTRATE 50 MG PO TABS: 50.0000 mg | ORAL_TABLET | Freq: Two times a day (BID) | ORAL | 2 refills | Status: DC

## 2022-04-26 MED ORDER — PERFLUTREN LIPID MICROSPHERE
1.0000 mL | INTRAVENOUS | Status: AC | PRN
  Administered 2022-04-26: 4 mL via INTRAVENOUS

## 2022-04-26 MED ORDER — FLECAINIDE ACETATE 150 MG PO TABS: 75.0000 mg | ORAL_TABLET | Freq: Two times a day (BID) | ORAL | 2 refills | Status: DC

## 2022-04-26 MED ORDER — DILTIAZEM HCL ER COATED BEADS 120 MG PO CP24
120.0000 mg | ORAL_CAPSULE | Freq: Every day | ORAL | Status: DC
  Administered 2022-04-26: 120 mg via ORAL

## 2022-04-26 NOTE — TOC Progression Note (Signed)
  Transition of Care [facility]) Screening Note   Patient Details  Name: Brooke Luna Date of Birth: 1946-02-17   Transition of Care [facility]) CM/SW Contact:    Boneta Lucks, RN Phone Number: 04/26/2022, 10:49 AM    Transition of Care Department [facility]) has reviewed patient and no TOC needs have been identified at this time. We will continue to monitor patient advancement through interdisciplinary progression rounds. If new patient transition needs arise, please place a TOC consult.     Expected Discharge Plan: Home/Self Care Barriers to Discharge: No Barriers Identified  Expected Discharge Plan and Services       Living arrangements for the past 2 months: Single Family Home                   Social Determinants of Health (SDOH) Interventions SDOH Screenings   Tobacco Use: Medium Risk (04/25/2022)    Readmission Risk Interventions    04/24/2021    2:27 PM  Readmission Risk Prevention Plan  Medication Screening Complete  Transportation Screening Complete

## 2022-04-26 NOTE — TOC Progression Note (Signed)
  Transition of Care [facility]) Screening Note   Patient Details  Name: Brooke Luna Date of Birth: 05/10/1945   Transition of Care [facility]) CM/SW Contact:    Boneta Lucks, RN Phone Number: 04/26/2022, 12:36 PM    Transition of Care Department [facility]) has reviewed patient and no TOC needs have been identified at this time. We will continue to monitor patient advancement through interdisciplinary progression rounds. If new patient transition needs arise, please place a TOC consult.     Expected Discharge Plan: Home/Self Care Barriers to Discharge: No Barriers Identified  Expected Discharge Plan and Services       Living arrangements for the past 2 months: Single Family Home                   Social Determinants of Health (SDOH) Interventions SDOH Screenings   Tobacco Use: Medium Risk (04/25/2022)    Readmission Risk Interventions    04/24/2021    2:27 PM  Readmission Risk Prevention Plan  Medication Screening Complete  Transportation Screening Complete

## 2022-04-26 NOTE — ED Notes (Signed)
Breakfast tray given. °

## 2022-04-26 NOTE — Therapy (Signed)
PT: Therapist spoke with pt who states that she has been getting out of bed and walking to the bathroom on her own and getting herself back into bed on her own and does not need a PT evaluation.  Rayetta Humphrey, [place] CLT [phone]

## 2022-04-26 NOTE — Discharge Summary (Signed)
Physician Discharge Summary  Brooke Luna OVF:[id number] DOB: 1946-01-09 DOA: 04/25/2022  PCP: Brooke Chroman, MD  Admit date: 04/25/2022 Discharge date: 04/26/2022  Admitted From: Home  Discharge disposition: Home  Recommendations for Outpatient Follow-Up:   Follow up with your primary care provider in one week.  Check CBC, BMP, magnesium in the next visit Follow-up with your cardiologist as outpatient to discuss about atrial fibrillation treatment,  Discharge Diagnosis:   Principal Problem:   Atrial fibrillation with rapid ventricular response (HCC) Active Problems:   Persistent atrial fibrillation (HCC)   CHF (congestive heart failure) (HCC)   GERD (gastroesophageal reflux disease)   Discharge Condition: Improved.  Diet recommendation: Low sodium, heart healthy.    Wound care: None.  Code status: Full.   History of Present Illness:   Patient is 76 years old female with past medical history of persistent atrial fibrillation presented to hospital with palpitation chest tightness and fatigue with shortness of breath.  Patient had noted palpitations for the last 1 week or so.  Denied any dizziness or syncope, headache lightheadedness.  In the, ED, patient was noted to have atrial fibrillation with rapid ventricular response.  She was given 2 doses of 10 mg IV Cardizem push without much response so was started on Cardizem drip and considered for admission to the hospital.  Of note patient does have a history of cardioversion in the past and follows up with Dr. Harl Luna as outpatient.  Patient was then admitted hospital for further evaluation and treatment.  Hospital Course:   Following conditions were addressed during hospitalization as listed below,  Atrial fibrillation with rapid ventricular response.  History of persistent atrial fibrillation.  Follows up with cardiology to branch as outpatient.  Patient was symptomatic on presentation.   Patient received Cardizem  bolus in the ED and was continued with Cardizem drip.  Cardiology was consulted due to history of cardioversion and was started on flecainide 75 mg twice daily along with Cardizem drip.  Patient did have 2D echocardiogram done 12/22 with LV ejection fraction of 60 to 65%.  Repeat 2D echocardiogram during this hospitalization showed LV ejection fraction of 60 to 65% with indeterminate diastolic parameters.   Patient is 120 mg long-acting and 30 mg as needed Cardizem at home.  Metoprolol dose was increased from 25 twice daily to 50 twice daily at this time.  Patient will continue to follow-up with cardiology as outpatient with this new regimen plus flecainide.  Continue with Xarelto.  TSH on 04/25/2022 was 4.0.  I spoke with Dr. Fabian Luna cardiology on-call at [facility] today regarding the patient and the plan is to continue flecainide and increased dose of metoprolol on discharge with outpatient electrophysiology/cardiology follow-up.   History of congestive heart failure Remained compensated.  2D echocardiogram with preserved LV function.  Disposition.  At this time, patient is stable for disposition home with outpatient PCP and cardiology follow-up.  Patient is aware of the need for follow-up with cardiology for definite atrial fibrillation management/follow-up.  Medical Consultants:   Cardiology  Procedures:    2D echocardiogram Subjective:   Today, patient was seen and examined at bedside.  Feels much better.  No dizziness lightheadedness chest pain or shortness of breath.  Wants to go home.  Able to ambulate without issues.  Discharge Exam:   Vitals:   04/26/22 1259 04/26/22 1350  BP:  (!) 108/59  Pulse:  (!) 55  Resp:    Temp: 97.9 F (36.6 C)  SpO2:  94%   Vitals:   04/26/22 0900 04/26/22 1000 04/26/22 1259 04/26/22 1350  BP: 139/79 135/71  (!) 108/59  Pulse: 75 69  (!) 55  Resp: (!) 22 (!) 24    Temp:   97.9 F (36.6 C)   TempSrc:   Oral   SpO2: 94% 94%  94%   Weight:      Height:       Body mass index is 48.79 kg/m.   General: Alert awake, not in obvious distress, morbidly obese HENT: pupils equally reacting to light,  No scleral pallor or icterus noted. Oral mucosa is moist.  Chest:  Clear breath sounds.  Diminished breath sounds bilaterally. No crackles or wheezes.  CVS: S1 &S2 heard. No murmur.  Irregular rhythm. Abdomen: Soft, nontender, nondistended.  Bowel sounds are heard.   Extremities: No cyanosis, clubbing or edema.  Peripheral pulses are palpable. Psych: Alert, awake and oriented, normal mood CNS:  No cranial nerve deficits.  Power equal in all extremities.   Skin: Warm and dry.  No rashes noted.  The results of significant diagnostics from this hospitalization (including imaging, microbiology, ancillary and laboratory) are listed below for reference.     Diagnostic Studies:   ECHOCARDIOGRAM COMPLETE  Result Date: 04/26/2022    ECHOCARDIOGRAM REPORT   Patient Name:   Brooke Luna Date of Exam: 04/26/2022 Medical Rec #:  [id number]         Height:       62.0 in Accession #:    [id number]        Weight:       266.8 lb Date of Birth:  [birth year]         BSA:          2.161 m Patient Age:    26 years          BP:           135/71 mmHg Patient Gender: F                 HR:           53 bpm. Exam Location:  Inpatient Procedure: 2D Echo, Color Doppler, Cardiac Doppler and Intracardiac            Opacification Agent Indications:    Atrial Fibrillation I48.91  History:        Patient has prior history of Echocardiogram examinations, most                 recent 04/23/2021. Arrythmias:Atrial Fibrillation. GERD.  Sonographer:    Darlina Sicilian RDCS Referring Phys: [id number]  Sonographer Comments: Suboptimal apical window. IMPRESSIONS  1. EF is challenging in afib. Left ventricular ejection fraction, by estimation, is 60 to 65%. The left ventricle has normal function. The left ventricle has no regional wall motion abnormalities.  Left ventricular diastolic parameters are indeterminate.  2. Right ventricular systolic function is normal. The right ventricular size is normal. Tricuspid regurgitation signal is inadequate for assessing PA pressure.  3. The mitral valve was not well visualized. No evidence of mitral valve regurgitation.  4. Aortic valve regurgitation is not visualized.  5. The inferior vena cava is normal in size with greater than 50% respiratory variability, suggesting right atrial pressure of 3 mmHg. Comparison(s): No significant change from prior study. FINDINGS  Left Ventricle: EF is challenging in afib. Left ventricular ejection fraction, by estimation, is 60 to 65%. The left ventricle has normal function. The left ventricle  has no regional wall motion abnormalities. Definity contrast agent was given IV to delineate the left ventricular endocardial borders. The left ventricular internal cavity size was normal in size. There is no left ventricular hypertrophy. Left ventricular diastolic parameters are indeterminate. Right Ventricle: The right ventricular size is normal. Right ventricular systolic function is normal. Tricuspid regurgitation signal is inadequate for assessing PA pressure. Left Atrium: Left atrial size was normal in size. Right Atrium: Right atrial size was normal in size. Pericardium: Trivial pericardial effusion is present. Mitral Valve: The mitral valve was not well visualized. No evidence of mitral valve regurgitation. Tricuspid Valve: Tricuspid valve regurgitation is not demonstrated. Aortic Valve: Aortic valve regurgitation is not visualized. Pulmonic Valve: Pulmonic valve regurgitation is not visualized. Aorta: The aortic root and ascending aorta are structurally normal, with no evidence of dilitation. Venous: The inferior vena cava is normal in size with greater than 50% respiratory variability, suggesting right atrial pressure of 3 mmHg.  LEFT VENTRICLE PLAX 2D LVIDd:         5.20 cm     Diastology  LVIDs:         3.10 cm     LV e' medial:    10.00 cm/s LV PW:         0.90 cm     LV E/e' medial:  9.7 LV IVS:        0.90 cm     LV e' lateral:   9.24 cm/s LVOT diam:     1.90 cm     LV E/e' lateral: 10.5 LV SV:         43 LV SV Index:   20 LVOT Area:     2.84 cm  LV Volumes (MOD) LV vol d, MOD A4C: 85.0 ml LV vol s, MOD A4C: 25.2 ml LV SV MOD A4C:     85.0 ml RIGHT VENTRICLE RV S prime:     12.10 cm/s TAPSE (M-mode): 1.9 cm LEFT ATRIUM             Index        RIGHT ATRIUM           Index LA diam:        4.00 cm 1.85 cm/m   RA Area:     10.50 cm LA Vol (A2C):   31.5 ml 14.58 ml/m  RA Volume:   19.20 ml  8.89 ml/m LA Vol (A4C):   47.4 ml 21.94 ml/m LA Biplane Vol: 40.5 ml 18.74 ml/m  AORTIC VALVE LVOT Vmax:   97.25 cm/s LVOT Vmean:  65.950 cm/s LVOT VTI:    0.152 m  AORTA Ao Root diam: 3.20 cm Ao Asc diam:  3.00 cm MITRAL VALVE MV Area (PHT): 3.51 cm    SHUNTS MV Decel Time: 216 msec    Systemic VTI:  0.15 m MV E velocity: 96.60 cm/s  Systemic Diam: 1.90 cm Phineas Inches Electronically signed by Phineas Inches Signature Date/Time: 04/26/2022/12:29:58 PM    Final    DG Chest Port 1 View  Result Date: 04/25/2022 CLINICAL DATA:  Chest pain.  AFib. EXAM: PORTABLE CHEST 1 VIEW COMPARISON:  01/04/2022 FINDINGS: Stable cardiomegaly. Aortic atherosclerotic calcification. Chronic bronchitic changes. Biapical pleural-parenchymal scarring. Hyperinflation. Prominent fat pad at the cardiac apex. No definite pleural effusion. No pneumothorax. IMPRESSION: COPD.  Cardiomegaly. Electronically Signed   By: Placido Sou M.D.   On: 04/25/2022 03:53     Labs:   Basic Metabolic Panel: Recent Labs  Lab 04/25/22 0300 04/26/22  0610  NA 136 138  K 3.7 4.6  CL 101 102  CO2 27 28  GLUCOSE 174* 186*  BUN 18 16  CREATININE 0.84 0.94  CALCIUM 8.4* 8.9  MG  --  2.2   GFR Estimated Creatinine Clearance: 63.1 mL/min (by C-G formula based on SCr of 0.94 mg/dL). Liver Function Tests: No results for input(s): "AST",  "ALT", "ALKPHOS", "BILITOT", "PROT", "ALBUMIN" in the last 168 hours. No results for input(s): "LIPASE", "AMYLASE" in the last 168 hours. No results for input(s): "AMMONIA" in the last 168 hours. Coagulation profile No results for input(s): "INR", "PROTIME" in the last 168 hours.  CBC: Recent Labs  Lab 04/25/22 0300 04/26/22 0610  WBC 9.7 10.4  HGB 14.5 14.1  HCT 44.8 45.5  MCV 87.0 89.2  PLT 283 302   Cardiac Enzymes: No results for input(s): "CKTOTAL", "CKMB", "CKMBINDEX", "TROPONINI" in the last 168 hours. BNP: Invalid input(s): "POCBNP" CBG: No results for input(s): "GLUCAP" in the last 168 hours. D-Dimer No results for input(s): "DDIMER" in the last 72 hours. Hgb A1c No results for input(s): "HGBA1C" in the last 72 hours. Lipid Profile Recent Labs    04/26/22 0610  CHOL 173  HDL 53  LDLCALC 85  TRIG 175*  CHOLHDL 3.3   Thyroid function studies Recent Labs    04/25/22 1258  TSH 4.046   Anemia work up No results for input(s): "VITAMINB12", "FOLATE", "FERRITIN", "TIBC", "IRON", "RETICCTPCT" in the last 72 hours. Microbiology Recent Results (from the past 240 hour(s))  MRSA Next Gen by PCR, Nasal     Status: None   Collection Time: 04/26/22 11:15 AM   Specimen: Nasal Mucosa; Nasal Swab  Result Value Ref Range Status   MRSA by PCR Next Gen NOT DETECTED NOT DETECTED Final    Comment: (NOTE) The GeneXpert MRSA Assay (FDA approved for NASAL specimens only), is one component of a comprehensive MRSA colonization surveillance program. It is not intended to diagnose MRSA infection nor to guide or monitor treatment for MRSA infections. Test performance is not FDA approved in patients less than 25 years old. Performed at [facility], [address], [place], [place] [address]      Discharge Instructions:   Discharge Instructions     Diet - low sodium heart healthy   Complete by: As directed    Discharge instructions   Complete by: As directed    Follow-up  with your primary care provider in 1 week.  Follow-up with cardiology as scheduled by the clinic for atrial fibrillation/plans for further treatment. Seek medical attention for worsening symptoms.  Please note that your dose of metoprolol has increased and you have been prescribed flecainide for your atrial fibrillation.   Increase activity slowly   Complete by: As directed       Allergies as of 04/26/2022       Reactions   Codeine Shortness Of Breath, Itching   Meloxicam Other (See Comments)   Stomach iritation        Medication List     TAKE these medications    acetaminophen 325 MG tablet Commonly known as: TYLENOL Take 650 mg by mouth every 6 (six) hours as needed for moderate pain or headache.   citalopram 20 MG tablet Commonly known as: CELEXA Take 20 mg by mouth daily.   clonazePAM 0.5 MG tablet Commonly known as: KLONOPIN Take 0.5 mg by mouth 2 (two) times daily as needed for anxiety.   diltiazem 120 MG 24 hr capsule Commonly  known as: CARDIZEM CD TAKE 1 CAPSULE BY MOUTH EVERY DAY What changed: how much to take   diltiazem 30 MG tablet Commonly known as: CARDIZEM Take 1 tablet (30 mg total) by mouth daily as needed (afib).   flecainide 150 MG tablet Commonly known as: TAMBOCOR Take 0.5 tablets (75 mg total) by mouth every 12 (twelve) hours.   furosemide 40 MG tablet Commonly known as: LASIX TAKE 1 TABLET BY MOUTH DAILY. MAY TAKEN AN ADDITIONAL 1/2 TABLET DAILY AS NEEDED FOR WEIGHT >263 LBS What changed: See the new instructions.   ibuprofen 200 MG tablet Commonly known as: ADVIL Take 200 mg by mouth every 6 (six) hours as needed for fever, headache or mild pain.   metoprolol tartrate 50 MG tablet Commonly known as: LOPRESSOR Take 1 tablet (50 mg total) by mouth 2 (two) times daily. What changed:  medication strength how much to take   rivaroxaban 20 MG Tabs tablet Commonly known as: XARELTO Take 20 mg by mouth daily.        Follow-up  Information     Arnoldo Lenis, MD Follow up on 05/01/2022.   Specialty: Cardiology Why: at  10:00 AM with his Nurse Practitioner [place] information: [place] [address] [phone]         Thompson Grayer, MD Follow up on 05/16/2022.   Specialty: Cardiology Why: at 1:45 PM    please not the appointment is will Dr. Myles Gip- a doctor like Dr. Rayann Heman ( Dr. Rayann Heman is no longer in our group) Contact information: [place] [address] [phone]                  Time coordinating discharge: 39 minutes  Signed:  Tayden Duran  [facility] 04/26/2022, 2:00 PM

## 2022-04-30 ENCOUNTER — Emergency Department (HOSPITAL_COMMUNITY): Payer: Medicare Other

## 2022-04-30 ENCOUNTER — Telehealth: Payer: Self-pay | Admitting: Nurse Practitioner

## 2022-04-30 ENCOUNTER — Emergency Department (HOSPITAL_COMMUNITY)
Admission: EM | Admit: 2022-04-30 | Discharge: 2022-04-30 | Disposition: A | Discharge status: Home / Self Care | Payer: Medicare Other | Attending: Emergency Medicine | Admitting: Emergency Medicine

## 2022-04-30 DIAGNOSIS — R0602 Shortness of breath: Secondary | ICD-10-CM | POA: Diagnosis present

## 2022-04-30 DIAGNOSIS — Z1152 Encounter for screening for COVID-19: Secondary | ICD-10-CM | POA: Insufficient documentation

## 2022-04-30 DIAGNOSIS — R0600 Dyspnea, unspecified: Secondary | ICD-10-CM | POA: Insufficient documentation

## 2022-04-30 DIAGNOSIS — I509 Heart failure, unspecified: Secondary | ICD-10-CM | POA: Insufficient documentation

## 2022-04-30 DIAGNOSIS — Z7901 Long term (current) use of anticoagulants: Secondary | ICD-10-CM | POA: Diagnosis not present

## 2022-04-30 DIAGNOSIS — R911 Solitary pulmonary nodule: Secondary | ICD-10-CM

## 2022-04-30 DIAGNOSIS — J189 Pneumonia, unspecified organism: Secondary | ICD-10-CM

## 2022-04-30 LAB — BASIC METABOLIC PANEL
Anion gap: 9 (ref 5–15)
BUN: 8 mg/dL (ref 8–23)
CO2: 30 mmol/L (ref 22–32)
Calcium: 8.5 mg/dL — ABNORMAL LOW (ref 8.9–10.3)
Chloride: 95 mmol/L — ABNORMAL LOW (ref 98–111)
Creatinine, Ser: 0.77 mg/dL (ref 0.44–1.00)
GFR, Estimated: >60 mL/min (ref >60)
Glucose, Bld: 149 mg/dL — ABNORMAL HIGH (ref 70–99)
Potassium: 4 mmol/L (ref 3.5–5.1)
Sodium: 134 mmol/L — ABNORMAL LOW (ref 135–145)

## 2022-04-30 LAB — CBC
HCT: 39 % (ref 36.0–46.0)
Hemoglobin: 12.4 g/dL (ref 12.0–15.0)
MCH: 28.1 pg (ref 26.0–34.0)
MCHC: 31.8 g/dL (ref 30.0–36.0)
MCV: 88.2 fL (ref 80.0–100.0)
Platelets: 277 10*3/uL (ref 150–400)
RBC: 4.42 MIL/uL (ref 3.87–5.11)
RDW: 14.9 % (ref 11.5–15.5)
WBC: 9.2 10*3/uL (ref 4.0–10.5)
nRBC: 0 % (ref 0.0–0.2)

## 2022-04-30 LAB — TROPONIN I (HIGH SENSITIVITY)
Troponin I (High Sensitivity): 4 ng/L (ref <18)
Troponin I (High Sensitivity): 4 ng/L (ref <18)

## 2022-04-30 LAB — BRAIN NATRIURETIC PEPTIDE: B Natriuretic Peptide: 116 pg/mL — ABNORMAL HIGH (ref 0.0–100.0)

## 2022-04-30 LAB — RESP PANEL BY RT-PCR (RSV, FLU A&B, COVID)  RVPGX2
Influenza A by PCR: NEGATIVE
Influenza B by PCR: NEGATIVE
Resp Syncytial Virus by PCR: NEGATIVE
SARS Coronavirus 2 by RT PCR: NEGATIVE

## 2022-04-30 MED ORDER — AZITHROMYCIN 250 MG PO TABS
500.0000 mg | ORAL_TABLET | Freq: Once | ORAL | Status: AC
  Administered 2022-04-30: 500 mg via ORAL
  Filled 2022-04-30: qty 2

## 2022-04-30 MED ORDER — IOHEXOL 350 MG/ML SOLN
75.0000 mL | Freq: Once | INTRAVENOUS | Status: AC | PRN
  Administered 2022-04-30: 75 mL via INTRAVENOUS

## 2022-04-30 MED ORDER — AMOXICILLIN-POT CLAVULANATE 875-125 MG PO TABS
1.0000 | ORAL_TABLET | Freq: Once | ORAL | Status: AC
  Administered 2022-04-30: 1 via ORAL
  Filled 2022-04-30: qty 1

## 2022-04-30 MED ORDER — AMOXICILLIN-POT CLAVULANATE 875-125 MG PO TABS: 1.0000 | ORAL_TABLET | Freq: Two times a day (BID) | ORAL | 0 refills | Status: DC

## 2022-04-30 MED ORDER — AZITHROMYCIN 250 MG PO TABS: 250.0000 mg | ORAL_TABLET | Freq: Every day | ORAL | 0 refills | Status: DC

## 2022-04-30 NOTE — Telephone Encounter (Signed)
I spoke with patient.She is very tachypneic, barely able to speak.She states her O2 sat is 83%. I advised her to go directly to the ED.Her spouse is taking her to [facility].   I will [place], [place]

## 2022-04-30 NOTE — ED Triage Notes (Signed)
Pt to ED c/o [facility] and chest pain x 3 weeks, worse with exhaustion, reports recently discharged from hospital. HX COPD , AFIB, home o2 user '@3L'$ .

## 2022-04-30 NOTE — ED Notes (Signed)
E-signature pad broken, pt gives verbal consent  

## 2022-04-30 NOTE — Discharge Instructions (Addendum)
Your CT scan today shows pneumonia.  You are being given 2 different antibiotics to help with this.  You were given your first dose of both today, start the prescription tomorrow.  You were also found to have a couple spots in your lung called nodules.  These may be related to the infection but we need to make sure this is not related to something more serious such as a mass or cancer.  You need to have a repeat CT scan in 3 months, which your primary care doctor can order.  If you develop high fever, vomiting, coughing up blood, new or worsening shortness of breath, or any other new/concerning symptoms then return to the ER for evaluation.

## 2022-04-30 NOTE — ED Provider Notes (Signed)
Care transferred to me.  Patient's CT images show likely multifocal pneumonia/inflammation.  Will treat with antibiotics.  She also has some nodules that we will need to be followed with a CT in 3 months and she was made aware of this.  Otherwise, she feels comfortable going home, has normal O2 sats on her home oxygen.   Sherwood Gambler, MD 04/30/22 [phone]

## 2022-04-30 NOTE — Telephone Encounter (Signed)
Pt c/o Shortness Of Breath: STAT if SOB developed within the last 24 hours or pt is noticeably SOB on the phone  1. Are you currently SOB (can you hear that pt is SOB on the phone)? NO 2. How long have you been experiencing SOB? Since discharged from hospital 3. Are you SOB when sitting or when up moving around? yes 4.  Are you currently experiencing any other symptoms? Headache, .SOB States that when she moves her O2 is dropping to 83. States that she feels terrible.

## 2022-04-30 NOTE — ED Provider Notes (Signed)
[facility] EMERGENCY DEPARTMENT Provider Note   CSN: [id number] Arrival date & time: 04/30/22  1114     History  Chief Complaint  Patient presents with   Shortness of Breath   Chest Pain    Brooke Luna is a 77 y.o. female.  Pt is a 77 yo female with a pmhx significant for afib (on Xarelto), gerd, arthritis, obesity, and chf.  Pt was admitted from 12/29-12/30 for afib.  Pt said she continues to feel sob.  She said she is normally on 2-3 L oxygen, but her O2 sats are going down to 83% on her usual oxygen with ambulation and she's been having to increase her oxygen usage.  She called her cardiologist this am and was told to come to the ED.  Pt denies f/c.  She feels like her HR has been ok.       Home Medications Prior to Admission medications   Medication Sig Start Date End Date Taking? Authorizing Provider  acetaminophen (TYLENOL) 325 MG tablet Take 650 mg by mouth every 6 (six) hours as needed for moderate pain or headache.    [provider]  citalopram (CELEXA) 20 MG tablet Take 20 mg by mouth daily. 10/28/12   [provider]  clonazePAM (KLONOPIN) 0.5 MG tablet Take 0.5 mg by mouth 2 (two) times daily as needed for anxiety.    [provider]  diltiazem (CARDIZEM CD) 120 MG 24 hr capsule TAKE 1 CAPSULE BY MOUTH EVERY DAY Patient taking differently: Take 120 mg by mouth daily. 12/09/21   Arnoldo Lenis, MD  diltiazem (CARDIZEM) 30 MG tablet Take 1 tablet (30 mg total) by mouth daily as needed (afib). 07/08/21   Arnoldo Lenis, MD  flecainide (TAMBOCOR) 150 MG tablet Take 0.5 tablets (75 mg total) by mouth every 12 (twelve) hours. 04/26/22 07/25/22  Pokhrel, Corrie Mckusick, MD  furosemide (LASIX) 40 MG tablet TAKE 1 TABLET BY MOUTH DAILY. MAY TAKEN AN ADDITIONAL 1/2 TABLET DAILY AS NEEDED FOR WEIGHT >263 LBS Patient taking differently: Take 40 mg by mouth daily. May take an additional '20mg'$  daily as needed for weight >263 lbs. 11/21/21   Arnoldo Lenis, MD  ibuprofen (ADVIL) 200 MG tablet Take 200 mg by mouth every 6 (six) hours as needed for fever, headache or mild pain.    [provider]  metoprolol tartrate (LOPRESSOR) 50 MG tablet Take 1 tablet (50 mg total) by mouth 2 (two) times daily. 04/26/22 07/25/22  Pokhrel, Corrie Mckusick, MD  rivaroxaban (XARELTO) 20 MG TABS tablet Take 20 mg by mouth daily.    [provider]      Allergies    Codeine and Meloxicam    Review of Systems   Review of Systems  Respiratory:  Positive for shortness of breath.   All other systems reviewed and are negative.   Physical Exam Updated Vital Signs BP 91/71   Pulse 84   Temp 98.8 F (37.1 C)   Resp (!) 24   Ht '5\' 2"'$  (1.575 m)   Wt 120.7 kg   SpO2 94%   BMI 48.65 kg/m  Physical Exam Vitals and nursing note reviewed.  Constitutional:      Appearance: She is well-developed. She is obese.  HENT:     Head: Normocephalic and atraumatic.     Mouth/Throat:     Mouth: Mucous membranes are moist.     Pharynx: Oropharynx is clear.  Eyes:     Extraocular Movements: Extraocular movements intact.  Pupils: Pupils are equal, round, and reactive to light.  Cardiovascular:     Rate and Rhythm: Normal rate. Rhythm irregular.  Pulmonary:     Effort: Tachypnea present.     Breath sounds: Rhonchi present.  Abdominal:     General: Bowel sounds are normal.     Palpations: Abdomen is soft.  Musculoskeletal:        General: Normal range of motion.     Cervical back: Normal range of motion and neck supple.  Skin:    General: Skin is warm.     Capillary Refill: Capillary refill takes less than 2 seconds.  Neurological:     General: No focal deficit present.     Mental Status: She is alert and oriented to person, place, and time.  Psychiatric:        Mood and Affect: Mood normal.        Behavior: Behavior normal.     ED Results / Procedures / Treatments   Labs (all labs ordered are listed, but only abnormal results are  displayed) Labs Reviewed  BASIC METABOLIC PANEL - Abnormal; Notable for the following components:      Result Value   Sodium 134 (*)    Chloride 95 (*)    Glucose, Bld 149 (*)    Calcium 8.5 (*)    All other components within normal limits  BRAIN NATRIURETIC PEPTIDE - Abnormal; Notable for the following components:   B Natriuretic Peptide 116.0 (*)    All other components within normal limits  RESP PANEL BY RT-PCR (RSV, FLU A&B, COVID)  [id number]  CBC  TROPONIN I (HIGH SENSITIVITY)  TROPONIN I (HIGH SENSITIVITY)    EKG EKG Interpretation  Date/Time:  Wednesday April 30 2022 12:05:23 EST Ventricular Rate:  67 PR Interval:  172 QRS Duration: 112 QT Interval:  419 QTC Calculation: 443 R Axis:   73 Text Interpretation: Sinus rhythm Borderline intraventricular conduction delay Low voltage, precordial leads No significant change since last tracing Confirmed by Isla Pence [phone]) on 04/30/2022 12:19:57 PM  Radiology DG Chest Port 1 View  Result Date: 04/30/2022 CLINICAL DATA:  Shortness of breath EXAM: PORTABLE CHEST 1 VIEW COMPARISON:  04/25/2022 and older FINDINGS: Hyperinflation with chronic appearing diffuse interstitial changes and some vascular congestion. Enlarged cardiopericardial silhouette with calcified aorta. Bilateral apical pleural thickening. No pneumothorax or consolidation. Overlapping cardiac leads. Films under penetrated. Degenerative changes IMPRESSION: HYPERINFLATION WITH CHRONIC APPEARING CHANGES OF THE LUNGS AND ENLARGED HEART: IMPRESSION: HYPERINFLATION WITH CHRONIC APPEARING CHANGES OF THE LUNGS AND ENLARGED HEART No significant interval change when adjusting for technique. Electronically Signed   By: Jill Side M.D.   On: 04/30/2022 11:58    Procedures Procedures    Medications Ordered in ED Medications  iohexol (OMNIPAQUE) 350 MG/ML injection 75 mL (75 mLs Intravenous Contrast Given 04/30/22 1555)    ED Course/ Medical Decision Making/ A&P                            Medical Decision Making Amount and/or Complexity of Data Reviewed Labs: ordered. Radiology: ordered.  Risk Prescription drug management.   This patient presents to the ED for concern of sob, this involves an extensive number of treatment options, and is a complaint that carries with it a high risk of complications and morbidity.  The differential diagnosis includes chf, pna, covid/flu, afib, anemia   Co morbidities that complicate the patient evaluation   afib (on Xarelto), gerd,  arthritis, obesity, and chf   Additional history obtained:  Additional history obtained from epic chart review External records from outside source obtained and reviewed including husband   Lab Tests:  I Ordered, and personally interpreted labs.  The pertinent results include:  cbc nl, bmp nl, trop nl, bnp sl elevated at 116, covid/flu/rsv neg   Imaging Studies ordered:  I ordered imaging studies including cxr and CT chest I independently visualized and interpreted imaging which showed  CXR: No significant interval change when adjusting for technique.  CT chest pending at shift change I agree with the radiologist interpretation   Cardiac Monitoring:  The patient was maintained on a cardiac monitor.  I personally viewed and interpreted the cardiac monitored which showed an underlying rhythm of: afib   Medicines ordered and prescription drug management:   I have reviewed the patients home medicines and have made adjustments as needed   Test Considered:  ct  Problem List / ED Course:  SOB:  pt is oxygenating well now.  She is able to ambulate without any problems.  CT chest added on to make sure she does not have a PE or pna.  Pt signed out to Dr. Regenia Skeeter at shift change.  If this is ok, she can go home.  She has an appt tomorrow with cards.   Reevaluation:  After the interventions noted above, I reevaluated the patient and found that they have :improved   Social  Determinants of Health:  Lives at home   Dispostion:  Pending at shift change.        Final Clinical Impression(s) / ED Diagnoses Final diagnoses:  Dyspnea, unspecified type    Rx / DC Orders ED Discharge Orders     None         Isla Pence, MD 04/30/22 [phone]

## 2022-04-30 NOTE — ED Notes (Signed)
Ambulated pt from room 8 to nursing station and back. Oxygen stayed above 96% 3 lpm.

## 2022-05-01 ENCOUNTER — Encounter: Payer: Self-pay | Admitting: Nurse Practitioner

## 2022-05-01 ENCOUNTER — Ambulatory Visit: Payer: Medicare Other | Attending: Nurse Practitioner | Admitting: Nurse Practitioner

## 2022-05-01 VITALS — BP 114/62 | HR 65 | Ht 65.0 in | Wt 271.0 lb

## 2022-05-01 DIAGNOSIS — I2510 Atherosclerotic heart disease of native coronary artery without angina pectoris: Secondary | ICD-10-CM

## 2022-05-01 DIAGNOSIS — I480 Paroxysmal atrial fibrillation: Secondary | ICD-10-CM

## 2022-05-01 DIAGNOSIS — I10 Essential (primary) hypertension: Secondary | ICD-10-CM | POA: Diagnosis not present

## 2022-05-01 DIAGNOSIS — J441 Chronic obstructive pulmonary disease with (acute) exacerbation: Secondary | ICD-10-CM

## 2022-05-01 DIAGNOSIS — R918 Other nonspecific abnormal finding of lung field: Secondary | ICD-10-CM

## 2022-05-01 NOTE — Patient Instructions (Signed)
Medication Instructions:  Continue all current medications.  Labwork: none  Testing/Procedures: none  Follow-Up: 3 months   Any Other Special Instructions Will Be Listed Below (If Applicable).  If you need a refill on your cardiac medications before your next appointment, please call your pharmacy.  

## 2022-05-01 NOTE — Progress Notes (Signed)
Cardiology Office Note:    Date:  05/01/2022   ID:  Brooke Luna, DOB [birth year], MRN [id number]  PCP:  [name], MD   [place] [facility] Cardiologist:  Carlyle Dolly, MD Electrophysiologist:  Thompson Grayer, MD     Referring MD: [name], MD   CC: Here for follow-up  History of Present Illness:    Brooke Luna is a 77 y.o. female with a hx of the following:  Nonobstructive CAD Persistent A-fib, A-flutter, s/p ablation in 2021 Chronic venous insufficiency COPD GERD Morbid obesity Anxiety  Patient is a very pleasant 77 year old female with past medical history as mentioned above.  Diagnosed with nonobstructive CAD by cardiac cath in 2006.  Patient was diagnosed with A-fib/a flutter in 2016 during hospital admission at [facility].  She has been previously followed by Dr. Rayann Heman and underwent ablation in 2021.  Most recently was admitted in March 2023 with a flutter with RVR, converted in the ED, s/p DCCV at that time.  Seen by Dr. Harl Bowie on November 25, 2021.  Was tolerating her medications well, compliant with medications.  Denied any bleeding while on Xarelto.  Was following Dr. Melvyn Novas with pulmonology, previous PFTs revealed evidence of restrictive lung disease.  Was overall doing well from a cardiac perspective.  No change in medication therapy was noted.  Was told to follow-up in 6 months.  Last seen by Bernerd Pho, PA-C on 03/12/2022 for palpitations and presyncope. Noted intermittent palpitations that resembled her prior A-fib. HR was variable at home. Unaware of what triggered A-fib, but reported was back in NSR by the time of the OV. Noted intermittent leg edema and was taking Lasix 40 mg daily. No medication changes were made and was told to follow up in January 2024.   Arrived to the ED on 04/25/2022 with chief complaint of A-fib, has stated at that time she was back in A-fib and reported fatigue.  She felt chest heaviness and heart  was racing, noted shortness of breath.  She received Cardizem for A-fib.  It was stated that patient might need likely EP referral and ablation.  Echocardiogram revealed EF approximately 60 to 65%, no RWMA, overall unremarkable, no significant change from previous study.  Metoprolol was increased and flecainide was continued.  Was told to follow-up with cardiology outpatient.  Patient contacted our office yesterday noting shortness of breath.  Based on her symptoms, she was advised to go to the emergency department for evaluation.  O2 sat was decreased at 83% while on 2 to 3 L of oxygen.  Tested negative for COVID, flu, and RSV.  BNP 116.  Troponins unremarkable.  CT of the chest was negative for PE but did show scattered patchy airspace disease bilaterally most prominent in upper lobes right middle lobe consistent with multifocal infection/inflammation.  Nodular opacities in left upper lobe and left lower lobe are noted measuring up to 7 mm and 10 mm respectively.  It was recommended to follow-up with CT of the chest in 3 months.  Was discharged on p.o. antibiotics.  Today she presents for follow-up with her husband.  She states she has not picked up her Rx for tx for recently diagnosed pneumonia. Overall doing well from a cardiac perspective. States Flecainide has helped her A-fib. Says her heart rate is stable, denies any recent tachycardia since discharge. Denies any chest pain, palpitations, orthopnea, PND, worsening swelling, significant weight changes, acute bleeding, or claudication.  Taking only metoprolol tartrate 25 mg  twice daily instead of 50 mg twice daily, as she experienced bradycardia with Lopressor at 50 mg twice daily.  Breathing is stable, remains on 3 to 4 L of oxygen.  She has upcoming appointment to see PCP tomorrow.  Denies any other questions or concerns today.  Past Medical History:  Diagnosis Date   Arthritis    pain and oa  right knee and pain in rt leg and foot--also pain left  knee-but right is worse   Atrial fibrillation (HCC)    Back pain    lumbar bulging disk   CHF (congestive heart failure) (HCC)    Contact dermatitis    underside of left leg--always in the same spots--comes and goes   Diverticulosis    GERD (gastroesophageal reflux disease)    diet control   Hemorrhoids    Obesity    Persistent atrial fibrillation ([place])     Past Surgical History:  Procedure Laterality Date   ATRIAL FIBRILLATION ABLATION N/A 06/07/2019   Procedure: ATRIAL FIBRILLATION ABLATION;  Surgeon: Thompson Grayer, MD;  Location: [facility];  Service: Cardiovascular;  Laterality: N/A;   CARDIOVERSION N/A 05/09/2019   Procedure: CARDIOVERSION;  Surgeon: Buford Dresser, MD;  Location: [facility] ENDOSCOPY;  Service: Cardiovascular;  Laterality: N/A;   CATARACT EXTRACTION W/PHACO Left 02/22/2015   Procedure: CATARACT EXTRACTION PHACO AND INTRAOCULAR LENS PLACEMENT LEFT EYE CDE=5.41;  Surgeon: Tonny Branch, MD;  Location: [facility];  Service: Ophthalmology;  Laterality: Left;   CATARACT EXTRACTION W/PHACO Right 03/26/2015   Procedure: CATARACT EXTRACTION PHACO AND INTRAOCULAR LENS PLACEMENT ([place]);  Surgeon: Tonny Branch, MD;  Location: [facility];  Service: Ophthalmology;  Laterality: Right;  CDE:5.01   CHOLECYSTECTOMY     surgery for broken right elbow Right    TOTAL KNEE ARTHROPLASTY  11/03/2011   Procedure: TOTAL KNEE ARTHROPLASTY;  Surgeon: Gearlean Alf, MD;  Location: [facility] ORS;  Service: Orthopedics;  Laterality: Right;   TUBAL LIGATION      Current Medications: Current Meds  Medication Sig   acetaminophen (TYLENOL) 325 MG tablet Take 650 mg by mouth every 6 (six) hours as needed for moderate pain or headache.   amoxicillin-clavulanate (AUGMENTIN) 875-125 MG tablet Take 1 tablet by mouth every 12 (twelve) hours.   azithromycin (ZITHROMAX) 250 MG tablet Take 1 tablet (250 mg total) by mouth daily.   citalopram (CELEXA) 20 MG tablet Take 20 mg by mouth daily.   clonazePAM (KLONOPIN)  0.5 MG tablet Take 0.5 mg by mouth 2 (two) times daily as needed for anxiety.   diltiazem (CARDIZEM CD) 120 MG 24 hr capsule TAKE 1 CAPSULE BY MOUTH EVERY DAY (Patient taking differently: Take 120 mg by mouth daily.)   diltiazem (CARDIZEM) 30 MG tablet Take 1 tablet (30 mg total) by mouth daily as needed (afib).   flecainide (TAMBOCOR) 150 MG tablet Take 0.5 tablets (75 mg total) by mouth every 12 (twelve) hours.   Fluticasone-Umeclidin-Vilant (TRELEGY ELLIPTA) 200-62.5-25 MCG/ACT AEPB Inhale 1 puff into the lungs daily.   furosemide (LASIX) 40 MG tablet TAKE 1 TABLET BY MOUTH DAILY. MAY TAKEN AN ADDITIONAL 1/2 TABLET DAILY AS NEEDED FOR WEIGHT >263 LBS (Patient taking differently: Take 40 mg by mouth daily. May take an additional '20mg'$  daily as needed for weight >263 lbs.)   ibuprofen (ADVIL) 200 MG tablet Take 200 mg by mouth every 6 (six) hours as needed for fever, headache or mild pain.   metoprolol tartrate (LOPRESSOR) 50 MG tablet Take 0.5 tablet (25 mg total) by mouth  2 (two) times daily.    rivaroxaban (XARELTO) 20 MG TABS tablet Take 20 mg by mouth daily.     Allergies:   Codeine and Meloxicam   Social History   Socioeconomic History   Marital status: Married    Spouse name: Not on file   Number of children: Not on file   Years of education: Not on file   Highest education level: Not on file  Occupational History   Not on file  Tobacco Use   Smoking status: Former    Packs/day: 1.00    Years: 38.00    Total pack years: 38.00    Types: Cigarettes    Start date: 06/25/1957    Quit date: 04/28/1996    Years since quitting: 26.0   Smokeless tobacco: Never   Tobacco comments:    quit smoking 1998  Vaping Use   Vaping Use: Never used  Substance and Sexual Activity   Alcohol use: Yes    Alcohol/week: 4.0 standard drinks of alcohol    Types: 4 Cans of beer per week    Comment: 2 beers twice a week 06/05/21   Drug use: No   Sexual activity: Not on file  Other Topics Concern    Not on file  Social History Narrative   Lives in [place] with spouse.   Retired.   Social Determinants of Health   Financial Resource Strain: Not on file  Food Insecurity: Not on file  Transportation Needs: Not on file  Physical Activity: Not on file  Stress: Not on file  Social Connections: Not on file     Family History: The patient's family history includes Diabetes in her mother; Stroke in her father and mother.  ROS:   Review of Systems  Constitutional: Negative.   HENT: Negative.    Eyes: Negative.   Respiratory:  Positive for shortness of breath. Negative for cough, hemoptysis, sputum production and wheezing.        See HPI.  Cardiovascular: Negative.   Gastrointestinal: Negative.   Genitourinary: Negative.   Musculoskeletal: Negative.   Skin: Negative.   Neurological: Negative.   Endo/Heme/Allergies: Negative.   Psychiatric/Behavioral: Negative.      Please see the history of present illness.    All other systems reviewed and are negative.  EKGs/Labs/Other Studies Reviewed:    The following studies were reviewed today:   EKG:  EKG is not ordered today.   CT angio of chest on April 30, 2022: IMPRESSION: No evidence of pulmonary embolism.   Scattered patchy airspace disease bilaterally, most prominent in the upper lobes right middle lobe, consistent with a multifocal infectious/inflammatory process. There are distinct nodular opacities in the left upper lobe and left lower lobe, measuring up to 7 mm and 10 mm respectively, which are indeterminate. Prominent bilateral hilar lymph nodes, favored to be reactive. Recommend follow-up chest CT in 3 months.  Echocardiogram on April 26, 2022:   1. EF is challenging in afib. Left ventricular ejection fraction, by  estimation, is 60 to 65%. The left ventricle has normal function. The left  ventricle has no regional wall motion abnormalities. Left ventricular  diastolic parameters are indeterminate.   2.  Right ventricular systolic function is normal. The right ventricular  size is normal. Tricuspid regurgitation signal is inadequate for assessing  PA pressure.   3. The mitral valve was not well visualized. No evidence of mitral valve  regurgitation.   4. Aortic valve regurgitation is not visualized.   5. The inferior  vena cava is normal in size with greater than 50%  respiratory variability, suggesting right atrial pressure of 3 mmHg.   Comparison(s): No significant change from prior study.  A-fib ablation on June 07, 2019: CONCLUSIONS: 1. Sinus rhythm upon presentation.   2. Intracardiac echo reveals a moderate sized left atrium with four separate pulmonary veins without evidence of pulmonary vein stenosis. 3. Successful electrical isolation and anatomical encircling of all four pulmonary veins with radiofrequency current. 4. No inducible arrhythmias following ablation both on and off of Isuprel 5. No early apparent complications.  Cardiac CT/CTA on June 02, 2019:  IMPRESSION: 1. There is normal pulmonary vein drainage into the left atrium. Measurements as reported   2. There is no thrombus in the left atrial appendage.   3. The esophagus courses posterior to ostium of right inferior pulmonary vein   4. Calcium score 14 (41st percentile for age/gender). The study was performed without use of NTG so is not optimal for plaque evaluation, but nonobstructive CAD is seen with calcified plaque in the proximal LAD and proximal LCX causing minimal (0-24%) stenosis  1.  No acute findings in the imaged extracardiac chest. 2.  Aortic Atherosclerosis (ICD10-I70.0). 3. Pulmonary artery enlargement suggests pulmonary arterial hypertension. 4. 2 mm right-sided pulmonary nodule. No follow-up needed if patient is low-risk. Non-contrast chest CT can be considered in 12 months if patient is high-risk. This recommendation follows the consensus statement: Guidelines for Management of  Incidental Pulmonary Nodules Detected on CT Images: From the Fleischner Society 2017; Radiology 2017; 284:228-243.  Recent Labs: 07/12/2021: ALT 13 04/25/2022: TSH 4.046 04/26/2022: Magnesium 2.2 04/30/2022: B Natriuretic Peptide 116.0; BUN 8; Creatinine, Ser 0.77; Hemoglobin 12.4; Platelets 277; Potassium 4.0; Sodium 134  Recent Lipid Panel    Component Value Date/Time   CHOL 173 04/26/2022 0610   TRIG 175 (H) 04/26/2022 0610   HDL 53 04/26/2022 0610   CHOLHDL 3.3 04/26/2022 0610   VLDL 35 04/26/2022 0610   LDLCALC 85 04/26/2022 0610     Risk Assessment/Calculations:    CHA2DS2-VASc Score = 6  This indicates a 9.7% annual risk of stroke. The patient's score is based upon: CHF History: 1 HTN History: 1 Diabetes History: 0 Stroke History: 0 Vascular Disease History: 1 Age Score: 2 Gender Score: 1       Physical Exam:    VS:  BP 114/62   Pulse 65   Ht '5\' 5"'$  (1.651 m)   Wt 271 lb (122.9 kg)   SpO2 92%   BMI 45.10 kg/m     Wt Readings from Last 3 Encounters:  05/01/22 271 lb (122.9 kg)  04/30/22 266 lb (120.7 kg)  04/25/22 266 lb 12.1 oz (121 kg)     GEN: Morbidly obese, 77 y.o. female in no acute distress, on chronic O2 HEENT: Normal NECK: No JVD; No carotid bruits CARDIAC: S1/S2, RRR, no murmurs, rubs, gallops; 2+ radial pulses, equal bilaterally; 1+ PT, equal bilaterally. RESPIRATORY:  Diminished to auscultation with fine/diminished crackles noted to posterior left lung base, overall clear and diminished throughout to auscultation MUSCULOSKELETAL:  Generalized, nonpitting edema; No deformity  SKIN: Warm and dry NEUROLOGIC:  Alert and oriented x 3 PSYCHIATRIC:  Normal affect   ASSESSMENT:    1. PAF (paroxysmal atrial fibrillation) ([place])   2. Coronary artery disease involving native heart without angina pectoris, unspecified vessel or lesion type   3. Hypertension, unspecified type   4. COPD with acute exacerbation ([place])   5. Pulmonary nodules   6.  Morbid obesity ([place])    PLAN:    In order of problems listed above:  PAF Ever since starting flecainide, she says she has not noticed her A-fib since last hospitalization. She is in NSR on exam today. Denies any recent tachycardia or palpitations. Continue Flecainide, Lopressor 25 mg BID, and Diltiazem, with PRN diltiazem for breakthrough palpitations. Follow up with EP (Dr. Myles Gip) as scheduled later this month. Has been seen previously at A-fib clinic. Heart healthy diet and regular cardiovascular exercise as tolerated encouraged. CrCl 80. Continue Xarelto 20 mg daily, denies any bleeding issues while on Xarelto.   CAD Cath in 2006 revealed nonobstructive CAD and CCTA in 2021 showed minimal plaque with coronary calcium score of 14. Dr. Johnsie Cancel who saw her during hospitalization in 03/2022 stated she had no significant CAD; therefore, was started on Flecainide. Stable with no anginal symptoms. No indication for ischemic evaluation. Continue Lopressor. Heart healthy diet and regular cardiovascular exercise as tolerated encouraged.   HTN BP stable today. BP well controlled at home. Continue current medication regimen. Discussed to monitor BP at home at least 2 hours after medications and sitting for 5-10 minutes. Heart healthy diet and regular cardiovascular exercise encouraged.   4. COPD, pulmonary nodules  Recent COPD exacerbation secondary to acquired pneumonia, unable to detect if this is HAP or CAP. Encouraged patient to pick up prescription  and start ABX. Encouraged to follow up with PCP tomorrow. Continue oxygen therapy. CT scan on 04/30/2022 revealed indeterminate nodular opacities along LUL and LLL, measuring 7 mm and 10 mm, with prominent bilateral hilar lymph nodes noted. Recommended follow up CT chest in 3 months. PCP to manage, follow up with pulmonology.    5. Morbid obesity BMI today 45.10. Weight loss via diet and exercise as tolerated encouraged. Discussed the impact being overweight  would have on cardiovascular risk.  6. Disposition: Follow up with me in 3-4 months or sooner if anything changes.   Crackles left lower base.  Medication Adjustments/Labs and Tests Ordered: Current medicines are reviewed at length with the patient today.  Concerns regarding medicines are outlined above.  No orders of the defined types were placed in this encounter.  No orders of the defined types were placed in this encounter.   Patient Instructions  Medication Instructions:  Continue all current medications.   Labwork: none  Testing/Procedures: none  Follow-Up: 3 months   Any Other Special Instructions Will Be Listed Below (If Applicable).   If you need a refill on your cardiac medications before your next appointment, please call your pharmacy.    Signed, Finis Bud, NP  05/01/2022 5:12 PM    [place]

## 2022-05-07 ENCOUNTER — Ambulatory Visit: Payer: Medicare Other | Admitting: Cardiology

## 2022-05-16 ENCOUNTER — Encounter: Payer: Self-pay | Admitting: Internal Medicine

## 2022-05-16 ENCOUNTER — Ambulatory Visit: Payer: Medicare Other | Admitting: Cardiovascular Disease

## 2022-05-16 ENCOUNTER — Ambulatory Visit: Payer: Medicare Other | Admitting: Internal Medicine

## 2022-05-16 VITALS — BP 126/72 | HR 57 | Ht 65.0 in | Wt 268.0 lb

## 2022-05-16 DIAGNOSIS — R0609 Other forms of dyspnea: Secondary | ICD-10-CM | POA: Diagnosis not present

## 2022-05-16 DIAGNOSIS — R918 Other nonspecific abnormal finding of lung field: Secondary | ICD-10-CM | POA: Insufficient documentation

## 2022-05-16 DIAGNOSIS — J9611 Chronic respiratory failure with hypoxia: Secondary | ICD-10-CM

## 2022-05-16 NOTE — Assessment & Plan Note (Signed)
D/c on 02 03/3022 p CAP and RAF - 05/16/2022   Walked on 3lpm POC  x  1  lap(s) =  approx 150  ft  @ slow pace, stopped due to sob/legs hurt with lowest 02 sats 93%   Advised: Make sure you check your oxygen saturation  AT  your highest level of activity (not after you stop)   to be sure it stays over 90% and adjust  02 flow upward to maintain this level if needed but remember to turn it back to previous settings when you stop (to conserve your supply).

## 2022-05-16 NOTE — Assessment & Plan Note (Signed)
ERV 30% on PFTs  12/28/19 @ wt 255  Body mass index is 44.6 kg/m.   Lab Results  Component Value Date   TSH 4.046 04/25/2022      Contributing to doe and risk of GERD >>>   reviewed the need and the process to achieve and maintain neg calorie balance > defer f/u primary care including intermittently monitoring thyroid status

## 2022-05-16 NOTE — Assessment & Plan Note (Signed)
Quit smoking 1998 at wt 140 Onset was 2019-20 on a background of wt gain up to 230 / orthopnea since 2016-17 - Covid Pos aug 2021  - PFT's 12/28/19 nl ex ERV 30% with DLCO 14.17 (71%) and corrected to 5.01 (122%) for alv vol  -  07/30/2020   Walked RA  approx   300 ft  @ moderate pace  stopped due to  Sob with sats 93%  - much worse since dx of CAP / Rapid afib > 02 dep at post hosp f/u 05/16/2022     When respiratory symptoms begin or become refractory well after a patient reports complete smoking cessation,  Especially when this wasn't the case while they were smoking, a red flag is raised based on the work of Dr Kris Mouton which states:  if you quit smoking when your best day FEV1 is still well preserved (as was previously documented to be the case here) it is highly unlikely you will progress to more severe disease.  That is to say, once the smoking stops,  the symptoms should not suddenly erupt or markedly worsen.  If so, the differential diagnosis should include  obesity/deconditioning,  LPR/Reflux/Aspiration syndromes,  occult CHF, or  especially side effect of medications commonly used in this population.    I suspect most of her ongoing problems are related to the bolded ddx above and already addressed ? On pna/Rapid afib so no change in rx needed for now but f/u in 4 weeks to regroup

## 2022-05-16 NOTE — Progress Notes (Signed)
Brooke [facility], female    DOB: 09-07-1945    MRN: [id number]   Brief patient profile:  40  yowf quit smoking 1998  @ 140 lb no resp problems until around 2019-2020 at around 230 lb  and gradually gained up to 264 with proportionate doe  >>>  self referred  to pulmonary clinic 07/30/2020   Onset of afib around 2018   Covid Pos  Aug 2021    History of Present Illness  07/30/2020  Pulmonary/ 1st [facility] eval/ [facility] / [facility]  Chief Complaint  Patient presents with   Pulmonary Consult    Referred by [facility]. Pt c/o DOE for the past few years, esp worse over the past year. She gets winded walking room to room at home, taking out the trash, showering.   Dyspnea:  [employer] pushes buggy, uses HC, one aisle is the limit = MMRC3 = can't walk 100 yards even at a slow pace at a flat grade s stopping due to sob   Cough: for about a year /   Sporadic/dry  Sleep: wakes up most nights in 45 degrees  recliner due to breathing or coughng  X 2017  SABA use: not helping  Rec To get the most out of exercise, you need to be continuously aware that you are short of breath, but never out of breath, for 30 minutes daily. PACE YOURSELF  Make sure you check your oxygen saturation at your highest level of activity to be sure it stays over 90% Pantoprazole (protonix) 40 mg   Take  30-60 min before first meal of the day and Pepcid (famotidine)  20 mg one @  bedtime until return to [facility]  GERD diet reviewed, bed blocks rec  Please schedule a follow up [facility] visit in 6 weeks, call sooner if needed > did not return as rec   Admit date: 04/25/2022 Discharge date: 04/26/2022 Recommendations for Outpatient Follow-Up:    Follow up with your primary care provider in one week.  Check CBC, BMP, magnesium in the next visit Follow-up with your cardiologist as outpatient to discuss about atrial fibrillation treatment,   Discharge Diagnosis:    Principal Problem:   Atrial fibrillation with  rapid ventricular response (HCC)   Persistent atrial fibrillation (HCC)   CHF (congestive heart failure) (HCC)   GERD (gastroesophageal reflux disease)     Discharge Condition: Improved.   Diet recommendation: Low sodium, heart healthy.     Wound care: None.   Code status: Full.     History of Present Illness:    Patient is 77 years old female with past medical history of persistent atrial fibrillation presented to [facility] with palpitation chest tightness and fatigue with shortness of breath.  Patient had noted palpitations for the last 1 week or so.  Denied any dizziness or syncope, headache lightheadedness.  In the, ED, patient was noted to have atrial fibrillation with rapid ventricular response.  She was given 2 doses of 10 mg IV Cardizem push without much response so was started on Cardizem drip and considered for admission to the [facility].  Of note patient does have a history of cardioversion in the past and follows up with [facility]. [facility] as outpatient.  Patient was then admitted [facility] for further evaluation and treatment.   [facility] Course:    Following conditions were addressed during hospitalization as listed below,   Atrial fibrillation with rapid ventricular response.  History of persistent atrial fibrillation.  Follows up with cardiology  to branch as outpatient.  Patient was symptomatic on presentation.   Patient received Cardizem bolus in the ED and was continued with Cardizem drip.  Cardiology was consulted due to history of cardioversion and was started on flecainide 75 mg twice daily along with Cardizem drip.  Patient did have 2D echocardiogram done 12/22 with LV ejection fraction of 60 to 65%.  Repeat 2D echocardiogram during this hospitalization showed LV ejection fraction of 60 to 65% with indeterminate diastolic parameters.   Patient is 120 mg long-acting and 30 mg as needed Cardizem at home.  Metoprolol dose was increased from 25 twice daily to 50 twice daily at this time.   Patient will continue to follow-up with cardiology as outpatient with this new regimen plus flecainide.  Continue with Xarelto.  TSH on 04/25/2022 was 4.0.  I spoke with [facility] cardiology on-call at [facility] today regarding the patient and the plan is to continue flecainide and increased dose of metoprolol on discharge with outpatient electrophysiology/cardiology follow-up.   History of congestive heart failure Remained compensated.  2D echocardiogram with preserved LV function.   Disposition.  At this time, patient is stable for disposition home with outpatient PCP and cardiology follow-up.  Patient is aware of the need for follow-up with cardiology for definite atrial fibrillation management/follow-up    05/16/2022  re establish s/p admit ov/[facility] re: f/u  sob/ nodules  maint on Trelegy no better   Chief Complaint  Patient presents with   Follow-up    Pt HFU for COPD exacerbation, also had ED visit for PNA. Imaging showed nodules needing to be f/u on  Dyspnea:  50 ft no better on trelegy / not much change on prednisone  Cough: none  Sleeping: 45 degrees recliner x 2017 sleep /sometimes with HA  SABA use: none  02: ever since d/c from [facility] on 2-4lpm  Covid status: vax x 2 and infected x one     No obvious day to day or daytime variability or assoc excess/ purulent sputum or mucus plugs or hemoptysis or cp or chest tightness, subjective wheeze or overt sinus or hb symptoms.   Sleeping  without nocturnal  or early am exacerbation  of respiratory  c/o's or need for noct saba. Also denies any obvious fluctuation of symptoms with weather or environmental changes or other aggravating or alleviating factors except as outlined above   No unusual exposure hx or h/o childhood pna/ asthma or knowledge of premature birth.  Current Allergies, Complete Past Medical History, Past Surgical History, Family History, and Social History were reviewed in [facility] record.  ROS  The following are not active complaints unless bolded Hoarseness, sore throat, dysphagia, dental problems, itching, sneezing,  nasal congestion or discharge of excess mucus or purulent secretions, ear ache,   fever, chills, sweats, unintended wt loss or wt gain, classically pleuritic or exertional cp,  orthopnea pnd or arm/hand swelling  or leg swelling, presyncope, palpitations, abdominal pain, anorexia, nausea, vomiting, diarrhea  or change in bowel habits or change in bladder habits, change in stools or change in urine, dysuria, hematuria,  rash, arthralgias, visual complaints, headache, numbness, weakness or ataxia or problems with walking or coordination,  change in mood or  memory.        Current Meds  Medication Sig   acetaminophen (TYLENOL) 325 MG tablet Take 650 mg by mouth every 6 (six) hours as needed for moderate pain or headache.   citalopram (CELEXA) 20 MG  tablet Take 20 mg by mouth daily.   clonazePAM (KLONOPIN) 0.5 MG tablet Take 0.5 mg by mouth 2 (two) times daily as needed for anxiety.   diltiazem (CARDIZEM CD) 120 MG 24 hr capsule TAKE 1 CAPSULE BY MOUTH EVERY DAY (Patient taking differently: Take 120 mg by mouth daily.)   diltiazem (CARDIZEM) 30 MG tablet Take 1 tablet (30 mg total) by mouth daily as needed (afib).   flecainide (TAMBOCOR) 150 MG tablet Take 0.5 tablets (75 mg total) by mouth every 12 (twelve) hours.   Fluticasone-Umeclidin-Vilant (TRELEGY ELLIPTA) 200-62.5-25 MCG/ACT AEPB Inhale 1 puff into the lungs daily.   furosemide (LASIX) 40 MG tablet TAKE 1 TABLET BY MOUTH DAILY. MAY TAKEN AN ADDITIONAL 1/2 TABLET DAILY AS NEEDED FOR WEIGHT >263 LBS (Patient taking differently: Take 40 mg by mouth daily. May take an additional '20mg'$  daily as needed for weight >263 lbs.)   ibuprofen (ADVIL) 200 MG tablet Take 200 mg by mouth every 6 (six) hours as needed for fever, headache or mild pain.   metoprolol tartrate (LOPRESSOR) 50 MG tablet Take 1  tablet (50 mg total) by mouth 2 (two) times daily. (Patient taking differently: Take 25 mg by mouth 2 (two) times daily.)   rivaroxaban (XARELTO) 20 MG TABS tablet Take 20 mg by mouth daily.           Past Medical History:  Diagnosis Date   Arthritis    pain and oa  right knee and pain in rt leg and foot--also pain left knee-but right is worse   Atrial fibrillation (HCC)    Back pain    lumbar bulging disk   Contact dermatitis    underside of left leg--always in the same spots--comes and goes   Diverticulosis    GERD (gastroesophageal reflux disease)    diet control   Hemorrhoids    Obesity    Persistent atrial fibrillation (HCC)       Objective:     Wt Readings from Last 3 Encounters:  05/16/22 268 lb (121.6 kg)  05/01/22 271 lb (122.9 kg)  04/30/22 266 lb (120.7 kg)     Vital signs reviewed  05/16/2022  - Note at rest 02 sats  91% on 3lpm POC   General appearance:    obese slow moving amb wf and     HEENT : Oropharynx  clear      Nasal turbinates nl    NECK :  without  apparent JVD/ palpable Nodes/TM    LUNGS: no acc muscle use,  Nl contour chest which is clear to A and P bilaterally without cough on insp or exp maneuvers   CV:  RRR  no s3 or murmur or increase in P2, and no edema   ABD:  quit obese but soft and nontender    MS:  slow gait/ ext warm without deformities Or obvious joint restrictions  calf tenderness, cyanosis or clubbing    SKIN: warm and dry without lesions    NEURO:  alert, approp, nl sensorium with  no motor or cerebellar deficits apparent.          I personally reviewed images and agree with radiology impression as follows:   Chest CTa   04/30/22  No evidence of pulmonary embolism.   Scattered patchy airspace disease bilaterally, most prominent in the upper lobes right middle lobe, consistent with a multifocal infectious/inflammatory process. There are distinct nodular opacities in the left upper lobe and left lower lobe, measuring  up to 7  mm and 10 mm respectively, which are indeterminate. Prominent bilateral hilar lymph nodes, favored to be reactive. Recommend follow-up chest CT in 3 months.              Assessment

## 2022-05-16 NOTE — Patient Instructions (Signed)
Make sure you check your oxygen saturation  AT  your highest level of activity (not after you stop)   to be sure it stays over 90% and adjust  02 flow upward to maintain this level if needed but remember to turn it back to previous settings when you stop (to conserve your supply).   Please schedule a follow up office visit in 4 weeks, sooner if needed with cxr on return

## 2022-05-16 NOTE — Assessment & Plan Note (Signed)
Quit smoking 1998 See CTa  04/30/22 > placed reminder needs another one in 07/30/22   CT results reviewed with pt >>> Too numerous to bx, too close to clinical infection to use PET,  > really only option for now is follow the Fleischner society guidelines as rec by radiology = 3 m comparison study  Discussed in detail all the  indications, usual  risks and alternatives  relative to the benefits with patient who agrees to proceed with w/u as outlined.     Each maintenance medication was reviewed in detail including emphasizing most importantly the difference between maintenance and prns and under what circumstances the prns are to be triggered using an action plan format where appropriate.  Total time for H and P, chart review, counseling,  directly observing portions of ambulatory 02 saturation study/ and generating customized AVS unique to this office visit / same day charting  > 40 min post hosp f/u eval

## 2022-05-19 ENCOUNTER — Other Ambulatory Visit: Payer: Self-pay | Admitting: Cardiology

## 2022-06-05 ENCOUNTER — Encounter (HOSPITAL_COMMUNITY): Payer: Self-pay | Admitting: *Deleted

## 2022-06-12 ENCOUNTER — Ambulatory Visit: Payer: Medicare Other | Attending: Cardiovascular Disease | Admitting: Cardiovascular Disease

## 2022-06-12 VITALS — BP 142/63 | HR 56 | Ht 65.0 in | Wt 268.0 lb

## 2022-06-12 DIAGNOSIS — I480 Paroxysmal atrial fibrillation: Secondary | ICD-10-CM

## 2022-06-12 NOTE — Progress Notes (Signed)
PCP: [name], MD Primary Cardiologist: Dr Harl Bowie Primary EP: Dr Myles Gip  Brooke [facility] is a 77 y.o. female who presents today for routine electrophysiology followup.    She underwent AF and flutter ablation by Dr. Rayann Heman in Feb 2021.  Since last being seen in our clinic, she was admitted with recurrence of AF. Just prior to this, she was admitted for respiratory failure and started on flecainide and steroids. For AF, she was started on flecainide. She reports that she has been doing well from a rhythm standpoint and is now under the care of a pulmonologist.  She is not very active.  + SOB with moderate activity.  Today, she denies symptoms of palpitations, chest pain,  lower extremity edema, dizziness, presyncope, or syncope.  The patient is otherwise without complaint today.   Past Medical History:  Diagnosis Date   Arthritis    pain and oa  right knee and pain in rt leg and foot--also pain left knee-but right is worse   Atrial fibrillation (HCC)    Back pain    lumbar bulging disk   CHF (congestive heart failure) (HCC)    Contact dermatitis    underside of left leg--always in the same spots--comes and goes   Diverticulosis    GERD (gastroesophageal reflux disease)    diet control   Hemorrhoids    Obesity    Persistent atrial fibrillation ([place])    Past Surgical History:  Procedure Laterality Date   ATRIAL FIBRILLATION ABLATION N/A 06/07/2019   Procedure: ATRIAL FIBRILLATION ABLATION;  Surgeon: Thompson Grayer, MD;  Location: [facility];  Service: Cardiovascular;  Laterality: N/A;   CARDIOVERSION N/A 05/09/2019   Procedure: CARDIOVERSION;  Surgeon: Buford Dresser, MD;  Location: [facility];  Service: Cardiovascular;  Laterality: N/A;   CATARACT EXTRACTION W/PHACO Left 02/22/2015   Procedure: CATARACT EXTRACTION PHACO AND INTRAOCULAR LENS PLACEMENT LEFT EYE CDE=5.41;  Surgeon: Tonny Branch, MD;  Location: [facility];  Service: Ophthalmology;  Laterality: Left;    CATARACT EXTRACTION W/PHACO Right 03/26/2015   Procedure: CATARACT EXTRACTION PHACO AND INTRAOCULAR LENS PLACEMENT ([place]);  Surgeon: Tonny Branch, MD;  Location: [facility];  Service: Ophthalmology;  Laterality: Right;  CDE:5.01   CHOLECYSTECTOMY     surgery for broken right elbow Right    TOTAL KNEE ARTHROPLASTY  11/03/2011   Procedure: TOTAL KNEE ARTHROPLASTY;  Surgeon: Gearlean Alf, MD;  Location: [facility];  Service: Orthopedics;  Laterality: Right;   TUBAL LIGATION      ROS- all systems are reviewed and negatives except as per HPI above  Current Outpatient Medications  Medication Sig Dispense Refill   acetaminophen (TYLENOL) 325 MG tablet Take 650 mg by mouth every 6 (six) hours as needed for moderate pain or headache.     citalopram (CELEXA) 20 MG tablet Take 20 mg by mouth daily.     clonazePAM (KLONOPIN) 0.5 MG tablet Take 0.5 mg by mouth 2 (two) times daily as needed for anxiety.     diltiazem (CARDIZEM CD) 120 MG 24 hr capsule TAKE 1 CAPSULE BY MOUTH EVERY DAY 90 capsule 2   diltiazem (CARDIZEM) 30 MG tablet Take 1 tablet (30 mg total) by mouth daily as needed (afib). 30 tablet 6   flecainide (TAMBOCOR) 150 MG tablet Take 0.5 tablets (75 mg total) by mouth every 12 (twelve) hours. 30 tablet 2   Fluticasone-Umeclidin-Vilant (TRELEGY ELLIPTA) 200-62.5-25 MCG/ACT AEPB Inhale 1 puff into the lungs daily.     furosemide (LASIX) 40  MG tablet TAKE 1 TABLET BY MOUTH DAILY. MAY TAKEN AN ADDITIONAL 1/2 TABLET DAILY AS NEEDED FOR WEIGHT >263 LBS (Patient taking differently: Take 40 mg by mouth daily. May take an additional 27m daily as needed for weight >263 lbs.) 135 tablet 2   ibuprofen (ADVIL) 200 MG tablet Take 200 mg by mouth every 6 (six) hours as needed for fever, headache or mild pain.     metoprolol tartrate (LOPRESSOR) 50 MG tablet Take 1 tablet (50 mg total) by mouth 2 (two) times daily. (Patient taking differently: Take 25 mg by mouth 2 (two) times daily.) 60 tablet 2   rivaroxaban  (XARELTO) 20 MG TABS tablet Take 20 mg by mouth daily.     No current facility-administered medications for this visit.    Physical Exam: Vitals:   06/12/22 1501  BP: (!) 142/63  Pulse: (!) 56  Weight: 268 lb (121.6 kg)  Height: 5' 5"$  (1.651 m)    Gen: Appears comfortable, well-nourished CV: RRR, trace dependent edema Pulm: breathing easily on supplemental oxygen   Wt Readings from Last 3 Encounters:  06/12/22 268 lb (121.6 kg)  05/16/22 268 lb (121.6 kg)  05/01/22 271 lb (122.9 kg)    EKG tracing today show sinus rhythm  Assessment and Plan:  Paroxysmal atrial fibrillation DC flecainide due to diagnosis of CAD. I recommended Tikosyn, but she would like to see how she does off antiarrhythmic medications I have advised lifestyle modification.   She will almost certainly have additional afib without substantial weight loss and exercise  2. HTN Stable No change required today  3. Obesity Body mass index is 44.6 kg/m. Lifestyle modification advised  4. SOB Likely due to deconditioning Echo reviewed We discussed importance of regular exercise at length today  Follow-up in AF clinic in 3 months  AMelida Quitter MD 06/12/2022 3:12 PM

## 2022-06-12 NOTE — Patient Instructions (Signed)
Medication Instructions:  Stop Flecainide (Tambocor)  Continue all other medications.     Labwork: none  Testing/Procedures: none  Follow-Up: 6 months   Any Other Special Instructions Will Be Listed Below (If Applicable).   If you need a refill on your cardiac medications before your next appointment, please call your pharmacy.

## 2022-06-25 ENCOUNTER — Telehealth: Payer: Self-pay | Admitting: Internal Medicine

## 2022-06-25 ENCOUNTER — Ambulatory Visit: Payer: Medicare Other

## 2022-06-25 ENCOUNTER — Ambulatory Visit (HOSPITAL_COMMUNITY)
Admission: RE | Admit: 2022-06-25 | Discharge: 2022-06-25 | Disposition: A | Discharge status: Home / Self Care | Payer: Medicare Other | Source: Ambulatory Visit | Attending: Internal Medicine | Admitting: Internal Medicine

## 2022-06-25 ENCOUNTER — Ambulatory Visit: Payer: Medicare Other | Admitting: Internal Medicine

## 2022-06-25 ENCOUNTER — Encounter: Payer: Self-pay | Admitting: Internal Medicine

## 2022-06-25 VITALS — BP 138/82 | HR 76 | Ht 65.0 in | Wt 268.0 lb

## 2022-06-25 DIAGNOSIS — R0609 Other forms of dyspnea: Secondary | ICD-10-CM | POA: Diagnosis present

## 2022-06-25 DIAGNOSIS — R918 Other nonspecific abnormal finding of lung field: Secondary | ICD-10-CM | POA: Diagnosis not present

## 2022-06-25 DIAGNOSIS — J9611 Chronic respiratory failure with hypoxia: Secondary | ICD-10-CM

## 2022-06-25 NOTE — Progress Notes (Unsigned)
Brooke Luna, female    DOB: Oct 07, 1945    MRN: [id number]   Brief patient profile:  67  yowf quit smoking 1998  @ 140 lb no resp problems until around 2019-2020 at around 230 lb  and gradually gained up to 264 with proportionate doe  >>>  self referred  to pulmonary clinic 07/30/2020   Onset of afib around 2018   Covid Pos  Aug 2021    History of Present Illness  07/30/2020  Pulmonary/ 1st office eval/ Brooke Luna / [facility]  Chief Complaint  Patient presents with   Pulmonary Consult    Referred by Brooke Luna. Pt c/o DOE for the past few years, esp worse over the past year. She gets winded walking room to room at home, taking out the trash, showering.   Dyspnea:  [employer] pushes buggy, uses HC, one aisle is the limit = MMRC3 = can't walk 100 yards even at a slow pace at a flat grade s stopping due to sob   Cough: for about a year /   Sporadic/dry  Sleep: wakes up most nights in 45 degrees  recliner due to breathing or coughng  X 2017  SABA use: not helping  Rec To get the most out of exercise, you need to be continuously aware that you are short of breath, but never out of breath, for 30 minutes daily. PACE YOURSELF  Make sure you check your oxygen saturation at your highest level of activity to be sure it stays over 90% Pantoprazole (protonix) 40 mg   Take  30-60 min before first meal of the day and Pepcid (famotidine)  20 mg one @  bedtime until return to office  GERD diet reviewed, bed blocks rec  Please schedule a follow up office visit in 6 weeks, call sooner if needed > did not return as rec   Admit date: 04/25/2022 Discharge date: 04/26/2022 Recommendations for Outpatient Follow-Up:    Follow up with your primary care provider in one week.  Check CBC, BMP, magnesium in the next visit Follow-up with your cardiologist as outpatient to discuss about atrial fibrillation treatment,   Discharge Diagnosis:    Principal Problem:   Atrial fibrillation with  rapid ventricular response (HCC)   Persistent atrial fibrillation (HCC)   CHF (congestive heart failure) (HCC)   GERD (gastroesophageal reflux disease)     Discharge Condition: Improved.   Diet recommendation: Low sodium, heart healthy.     Wound care: None.   Code status: Full.     History of Present Illness:    Patient is 77 years old female with past medical history of persistent atrial fibrillation presented to hospital with palpitation chest tightness and fatigue with shortness of breath.  Patient had noted palpitations for the last 1 week or so.  Denied any dizziness or syncope, headache lightheadedness.  In the, ED, patient was noted to have atrial fibrillation with rapid ventricular response.  She was given 2 doses of 10 mg IV Cardizem push without much response so was started on Cardizem drip and considered for admission to the hospital.  Of note patient does have a history of cardioversion in the past and follows up with Brooke Luna as outpatient.  Patient was then admitted hospital for further evaluation and treatment.   Hospital Course:    Following conditions were addressed during hospitalization as listed below,   Atrial fibrillation with rapid ventricular response.  History of persistent atrial fibrillation.  Follows up with cardiology  to branch as outpatient.  Patient was symptomatic on presentation.   Patient received Cardizem bolus in the ED and was continued with Cardizem drip.  Cardiology was consulted due to history of cardioversion and was started on flecainide 75 mg twice daily along with Cardizem drip.  Patient did have 2D echocardiogram done 12/22 with LV ejection fraction of 60 to 65%.  Repeat 2D echocardiogram during this hospitalization showed LV ejection fraction of 60 to 65% with indeterminate diastolic parameters.   Patient is 120 mg long-acting and 30 mg as needed Cardizem at home.  Metoprolol dose was increased from 25 twice daily to 50 twice daily at this time.   Patient will continue to follow-up with cardiology as outpatient with this new regimen plus flecainide.  Continue with Xarelto.  TSH on 04/25/2022 was 4.0.  I spoke with Brooke Luna cardiology on-call at [facility] today regarding the patient and the plan is to continue flecainide and increased dose of metoprolol on discharge with outpatient electrophysiology/cardiology follow-up.   History of congestive heart failure Remained compensated.  2D echocardiogram with preserved LV function.   Disposition.  At this time, patient is stable for disposition home with outpatient PCP and cardiology follow-up.  Patient is aware of the need for follow-up with cardiology for definite atrial fibrillation management/follow-up    05/16/2022  re establish s/p admit ov/[place] office/Brooke Luna re: f/u  sob/ nodules  maint on Trelegy no better   Chief Complaint  Patient presents with   Follow-up    Pt HFU for COPD exacerbation, also had ED visit for PNA. Imaging showed nodules needing to be f/u on  Dyspnea:  50 ft no better on trelegy / not much change on prednisone  Cough: none  Sleeping: 45 degrees recliner x 2017 sleep /sometimes with HA  SABA use: none  02: ever since d/c from [place] on 2-4lpm  Covid status: vax x 2 and infected x one  Rec Make sure you check your oxygen saturation  AT  your highest level of activity (not after you stop)   to be sure it stays over 90%   Please schedule a follow up office visit in 4 weeks, sooner if needed with cxr on return    06/25/2022  f/u ov/[place] office/Brooke Luna re: doe/ obesity changes on cxr  maint on 02 only    Chief Complaint  Patient presents with   Follow-up    Breathing is about the same since last ov CXR done today  Has questions about blood work    Dyspnea:  house bound due to low 02 3lpm improved since dec  2023 but not as good a year prior to OV   Cough: none  Sleeping: 45 degrees since 2017  SABA use: none  02: 3lpm cont daytime/ 3lpm cont  at rest Covid status: 2 vax/ one infection      No obvious day to day or daytime variability or assoc excess/ purulent sputum or mucus plugs or hemoptysis or cp or chest tightness, subjective wheeze or overt sinus or hb symptoms.    Also denies any obvious fluctuation of symptoms with weather or environmental changes or other aggravating or alleviating factors except as outlined above   No unusual exposure hx or h/o childhood pna/ asthma or knowledge of premature birth.  Current Allergies, Complete Past Medical History, Past Surgical History, Family History, and Social History were reviewed in [employer] record.  ROS  The following are not active complaints unless bolded Hoarseness,  sore throat, dysphagia, dental problems, itching, sneezing,  nasal congestion or discharge of excess mucus or purulent secretions, ear ache,   fever, chills, sweats, unintended wt loss or wt gain, classically pleuritic or exertional cp,  orthopnea pnd or arm/hand swelling  or leg swelling, presyncope, palpitations, abdominal pain, anorexia, nausea, vomiting, diarrhea  or change in bowel habits or change in bladder habits, change in stools or change in urine, dysuria, hematuria,  rash, arthralgias, visual complaints, headache, numbness, weakness or ataxia or problems with walking or coordination,  change in mood or  memory.        Current Meds  Medication Sig   acetaminophen (TYLENOL) 325 MG tablet Take 650 mg by mouth every 6 (six) hours as needed for moderate pain or headache.   citalopram (CELEXA) 20 MG tablet Take 20 mg by mouth daily.   clonazePAM (KLONOPIN) 0.5 MG tablet Take 0.5 mg by mouth 2 (two) times daily as needed for anxiety.   diltiazem (CARDIZEM CD) 120 MG 24 hr capsule TAKE 1 CAPSULE BY MOUTH EVERY DAY   diltiazem (CARDIZEM) 30 MG tablet Take 1 tablet (30 mg total) by mouth daily as needed (afib).   furosemide (LASIX) 40 MG tablet TAKE 1 TABLET BY MOUTH DAILY. MAY TAKEN AN  ADDITIONAL 1/2 TABLET DAILY AS NEEDED FOR WEIGHT >263 LBS (Patient taking differently: Take 40 mg by mouth daily. May take an additional '20mg'$  daily as needed for weight >263 lbs.)   ibuprofen (ADVIL) 200 MG tablet Take 200 mg by mouth every 6 (six) hours as needed for fever, headache or mild pain.   metoprolol tartrate (LOPRESSOR) 50 MG tablet Take 1 tablet (50 mg total) by mouth 2 (two) times daily. (Patient taking differently: Take 25 mg by mouth 2 (two) times daily.)   rivaroxaban (XARELTO) 20 MG TABS tablet Take 20 mg by mouth daily.                   Past Medical History:  Diagnosis Date   Arthritis    pain and oa  right knee and pain in rt leg and foot--also pain left knee-but right is worse   Atrial fibrillation (HCC)    Back pain    lumbar bulging disk   Contact dermatitis    underside of left leg--always in the same spots--comes and goes   Diverticulosis    GERD (gastroesophageal reflux disease)    diet control   Hemorrhoids    Obesity    Persistent atrial fibrillation (HCC)       Objective:   Wts    06/25/2022       268  05/16/22 268 lb (121.6 kg)  05/01/22 271 lb (122.9 kg)  04/30/22 266 lb (120.7 kg)     Vital signs reviewed  06/25/2022  - Note at rest 02 sats  92% on RA   General appearance:    MO (by bmi) amb wf nad     HEENT : Oropharynx  clear      NECK :  without  apparent JVD/ palpable Nodes/TM    LUNGS: no acc muscle use,  Nl contour chest which is clear to A and P bilaterally without cough on insp or exp maneuvers   CV:  RRR  no s3 or murmur or increase in P2, and no edema   ABD:  obese  soft and nontender    MS:   ext warm without deformities Or obvious joint restrictions  calf tenderness, cyanosis or clubbing  SKIN: warm and dry without lesions    NEURO:  alert, approp, nl sensorium with  no motor or cerebellar deficits apparent.              I personally reviewed images and agree with radiology impression as follows:    Chest CTa   04/30/22  No evidence of pulmonary embolism.  Scattered patchy airspace disease bilaterally, most prominent in the upper lobes right middle lobe, consistent with a multifocal infectious/inflammatory process. There are distinct nodular opacities in the left upper lobe and left lower lobe, measuring up to 7 mm and 10 mm respectively, which are indeterminate. Prominent bilateral hilar lymph nodes, favored to be reactive. Recommend follow-up chest CT in 3 months.       CXR PA and Lateral:   06/25/2022 :    I personally reviewed images and impression is as follows:     No acute findings  Labs ordered/ reviewed:      Chemistry      Component Value Date/Time   NA 141 06/25/2022 1406   K 4.4 06/25/2022 1406   CL 96 06/25/2022 1406   CO2 29 06/25/2022 1406   BUN 8 06/25/2022 1406   CREATININE 0.79 06/25/2022 1406      Component Value Date/Time   CALCIUM 9.7 06/25/2022 1406   ALKPHOS 66 07/12/2021 0449   AST 13 (L) 07/12/2021 0449   ALT 13 07/12/2021 0449   BILITOT 0.9 07/12/2021 0449        Lab Results  Component Value Date   WBC 10.3 06/25/2022   HGB 13.1 06/25/2022   HCT 39.4 06/25/2022   MCV 82 06/25/2022   PLT 401 06/25/2022       EOS                                                              0.4                                    06/25/2022       Lab Results  Component Value Date   TSH 4.046 04/25/2022         Lab Results  Component Value Date   ESRSEDRATE 41 (H) 06/25/2022        BNP      06/25/2022      57     Assessment

## 2022-06-25 NOTE — Telephone Encounter (Signed)
Joe husband would like to know if patient needs xray for appointment today 06/25/2022. Joe phone number is [phone].

## 2022-06-25 NOTE — Patient Instructions (Signed)
Make sure you check your oxygen saturation  AT  your highest level of activity (not after you stop)   to be sure it stays over 90% and adjust  02 flow upward to maintain this level if needed but remember to turn it back to previous settings when you stop (to conserve your supply).   Please remember to go to the lab department   for your tests - we will call you with the results when they are available.      Please schedule a follow up visit in 3 months but call sooner if needed

## 2022-06-25 NOTE — Telephone Encounter (Signed)
Called patients husband- per last ov note, Dr. Melvyn Novas did want CXR on return. CXR ordered.  Nothing further needed at this time.

## 2022-06-26 ENCOUNTER — Encounter: Payer: Self-pay | Admitting: Internal Medicine

## 2022-06-26 LAB — CBC WITH DIFFERENTIAL/PLATELET
Basophils Absolute: 0.1 10*3/uL (ref 0.0–0.2)
Basos: 1 %
EOS (ABSOLUTE): 0.4 10*3/uL (ref 0.0–0.4)
Eos: 4 %
Hematocrit: 39.4 % (ref 34.0–46.6)
Hemoglobin: 13.1 g/dL (ref 11.1–15.9)
Immature Grans (Abs): 0.1 10*3/uL (ref 0.0–0.1)
Immature Granulocytes: 1 %
Lymphocytes Absolute: 1.6 10*3/uL (ref 0.7–3.1)
Lymphs: 15 %
MCH: 27.3 pg (ref 26.6–33.0)
MCHC: 33.2 g/dL (ref 31.5–35.7)
MCV: 82 fL (ref 79–97)
Monocytes Absolute: 0.6 10*3/uL (ref 0.1–0.9)
Monocytes: 6 %
Neutrophils Absolute: 7.6 10*3/uL — ABNORMAL HIGH (ref 1.4–7.0)
Neutrophils: 73 %
Platelets: 401 10*3/uL (ref 150–450)
RBC: 4.79 x10E6/uL (ref 3.77–5.28)
RDW: 13.5 % (ref 11.7–15.4)
WBC: 10.3 10*3/uL (ref 3.4–10.8)

## 2022-06-26 LAB — BASIC METABOLIC PANEL
BUN/Creatinine Ratio: 10 — ABNORMAL LOW (ref 12–28)
BUN: 8 mg/dL (ref 8–27)
CO2: 29 mmol/L (ref 20–29)
Calcium: 9.7 mg/dL (ref 8.7–10.3)
Chloride: 96 mmol/L (ref 96–106)
Creatinine, Ser: 0.79 mg/dL (ref 0.57–1.00)
Glucose: 129 mg/dL — ABNORMAL HIGH (ref 70–99)
Potassium: 4.4 mmol/L (ref 3.5–5.2)
Sodium: 141 mmol/L (ref 134–144)
eGFR: 77 mL/min/{1.73_m2} (ref >59)

## 2022-06-26 LAB — SEDIMENTATION RATE: Sed Rate: 41 mm/hr — ABNORMAL HIGH (ref 0–40)

## 2022-06-26 LAB — BRAIN NATRIURETIC PEPTIDE: BNP: 57 pg/mL (ref 0.0–100.0)

## 2022-06-26 NOTE — Assessment & Plan Note (Addendum)
D/c on 02 03/3022 p CAP and RAF 06/25/2022   Walked on 3 lpm POC  x  1  lap(s) =  approx 150  ft  @ slow pace, stopped due to sob  with lowest 02 sats 91% then dropped to 89% on lap 2 and required 4lpm POCto complete   Rec Make sure you check your oxygen saturation  AT  your highest level of activity (not after you stop)   to be sure it stays over 90% and adjust  02 flow upward to maintain this level if needed but remember to turn it back to previous settings when you stop (to conserve your supply).

## 2022-06-26 NOTE — Assessment & Plan Note (Addendum)
Quit smoking 1998 at wt 140 Onset was 2019-20 on a background of wt gain up to 230 / orthopnea since 2016-17 - Covid Pos aug 2021  - PFT's 12/28/19 nl ex ERV 30% with DLCO 14.17 (71%) and corrected to 5.01 (122%) for alv vol  -  07/30/2020   Walked RA  approx   300 ft  @ moderate pace  stopped due to  Sob with sats 93%  - much worse since dx of CAP / Rapid afib > 02 dep at post hosp f/u 05/16/2022     Symptoms are  disproportionate to objective findings and not clear to what extent this is actually a pulmonary  problem but pt does appear to have difficult to sort out respiratory symptoms of unknown origin for which  DDX  = almost all start with A and  include Adherence, Ace Inhibitors, Acid Reflux, Active Sinus Disease, Alpha 1 Antitripsin deficiency, Anxiety masquerading as Airways dz,  ABPA,  Allergy(esp in young), Aspiration (esp in elderly), Adverse effects of meds,  Active smoking or Vaping, A bunch of PE's/clot burden (a few small clots can't cause this syndrome unless there is already severe underlying pulm or vascular dz with poor reserve),  Anemia or thyroid disorder, plus two Bs  = Bronchiectasis and Beta blocker use..and one C= CHF    Adherence is always the initial "prime suspect" and is a multilayered concern that requires a "trust but verify" approach in every patient - starting with knowing how to use medications, especially inhalers, correctly, keeping up with refills and understanding the fundamental difference between maintenance and prns vs those medications only taken for a very short course and then stopped and not refilled.  - explained approp use of 02 to improve ex tol (see separate a/p)   ? Allergy/asthma > nothing to suggest and no assoc rhinitis/ cough so unlikely  ? Adverse drug effects > none of the usual suspects likely   ? Anxiety/depression/ deconditioning  > usually at the bottom of this list of usual suspects but   note already on psychotropics and may interfere with  adherence and also interpretation of response or lack thereof to symptom management which can be quite subjective > reconditioning reviewed  ? A bunch of Pe's > very unlikely on DOAC  ? Beta blocker effects > unlikely   ? Chf > unlikely with BNP so low   Main issues continue to be   deconditioning at this point, no evidence at all of airflow obstruction at this point

## 2022-06-26 NOTE — Assessment & Plan Note (Signed)
ERV 30% on PFTs  12/28/19 @ wt 255  Body mass index is 44.6 kg/m.  -    Lab Results  Component Value Date   TSH 4.046 04/25/2022      Contributing to doe and risk of GERD >>>   reviewed the need and the process to achieve and maintain neg calorie balance > defer f/u primary care including intermittently monitoring thyroid status   Each maintenance medication was reviewed in detail including emphasizing most importantly the difference between maintenance and prns and under what circumstances the prns are to be triggered using an action plan format where appropriate.  Total time for H and P, chart review, counseling,  directly observing portions of ambulatory 02 saturation study/ and generating customized AVS unique to this office visit / same day charting = 32 min

## 2022-06-26 NOTE — Assessment & Plan Note (Addendum)
See CTa  04/30/22 > placed reminder needs another one in 07/30/22

## 2022-07-04 ENCOUNTER — Telehealth: Payer: Self-pay | Admitting: Cardiology

## 2022-07-04 MED ORDER — RIVAROXABAN 20 MG PO TABS: 20.0000 mg | ORAL_TABLET | Freq: Every day | ORAL | 1 refills | Status: DC

## 2022-07-04 NOTE — Telephone Encounter (Signed)
*  STAT* If patient is at the pharmacy, call can be transferred to refill team.   1. Which medications need to be refilled? (please list name of each medication and dose if known)   rivaroxaban (XARELTO) 20 MG TABS tablet    2. Which pharmacy/location (including street and city if local pharmacy) is medication to be sent to?  [place], [place]    3. Do they need a 30 day or 90 day supply? 90 day

## 2022-07-04 NOTE — Telephone Encounter (Signed)
Pt last saw Finis Bud, [place] on 05/01/22, last labs 06/25/22 Creat 0.79, age 77, weight 121.6kg, CrCl 114.48, based on CrCl pt is on appropriate dosage of Xarelto '20mg'$  QD for afib.  Will refill rx.

## 2022-07-28 ENCOUNTER — Telehealth: Payer: Self-pay | Admitting: *Deleted

## 2022-07-28 DIAGNOSIS — R918 Other nonspecific abnormal finding of lung field: Secondary | ICD-10-CM

## 2022-07-28 NOTE — Telephone Encounter (Signed)
Spoke with the pt and notified due for chest ct to f/u on pulmonary nodules  Pt verbalized understanding and agreeable to have the ct  Order placed

## 2022-07-28 NOTE — Telephone Encounter (Signed)
-----   Message from Tanda Rockers, MD sent at 05/16/2022  3:18 PM EST ----- Ct s contrast f/u MPN due by end of 1st week in April 2024

## 2022-08-05 ENCOUNTER — Telehealth: Payer: Self-pay | Admitting: Cardiology

## 2022-08-05 NOTE — Telephone Encounter (Signed)
Reports she thinks she has been in A-fib for 2 weeks. Reports heart has been racing. Denies chest pain, dizziness or SOB. Denies active symptoms. Reports HR was up to 150 bpm 2 days ago. Reports taking flecainide 60 mg on Tuesday, Wednesday and Thursday last week and HR decreased to 80's. Says she is aware that she isn't supposed to take flecainide. Reports that she has been taking diltiazem 30 mg daily for the past 2 days. Today HR 80 & BP 125/74. Medications reviewed. Gave first available appointment to see Philis Nettle on 08/11/2022 @1 :30 pm. Advised if she develops worsening symptoms, to go to the ED for an evaluation. Verbalized understanding of plan.

## 2022-08-05 NOTE — Telephone Encounter (Signed)
Pt sent mychart message stating  I have been n a fib for several weeks and think I need to be checked out

## 2022-08-08 ENCOUNTER — Ambulatory Visit (HOSPITAL_COMMUNITY)
Admission: RE | Admit: 2022-08-08 | Discharge: 2022-08-08 | Disposition: A | Discharge status: Home / Self Care | Payer: Medicare Other | Source: Ambulatory Visit | Attending: Internal Medicine | Admitting: Internal Medicine

## 2022-08-08 DIAGNOSIS — R918 Other nonspecific abnormal finding of lung field: Secondary | ICD-10-CM | POA: Diagnosis present

## 2022-08-11 ENCOUNTER — Ambulatory Visit: Payer: Medicare Other | Admitting: Nurse Practitioner

## 2022-08-21 ENCOUNTER — Encounter: Payer: Self-pay | Admitting: Cardiology

## 2022-08-21 ENCOUNTER — Ambulatory Visit: Payer: Medicare Other | Attending: Cardiology | Admitting: Cardiology

## 2022-08-21 VITALS — BP 126/60 | HR 60 | Ht 65.0 in | Wt 259.2 lb

## 2022-08-21 DIAGNOSIS — I10 Essential (primary) hypertension: Secondary | ICD-10-CM

## 2022-08-21 DIAGNOSIS — I480 Paroxysmal atrial fibrillation: Secondary | ICD-10-CM

## 2022-08-21 DIAGNOSIS — I5032 Chronic diastolic (congestive) heart failure: Secondary | ICD-10-CM

## 2022-08-21 NOTE — Progress Notes (Signed)
Clinical Summary Brooke Luna is a 77 y.o.female seen today for follow up of the following medical problems.    1. PAF/Aflutter - diagnosed 12/2014 during hospital admission at [facility] - followed by Dr Brooke Luna, she is s/p afib ablation 05/2019   - 11/2019 had admission with atrial flutter. Incidetnal COVID + at the time. She was rate controlled, at follow ups had converted back to SR - ER visit 07/17/20 with palpitations,EKG with aflutter with variable conduction.  - rates were able to be controlled in ER, discharged   - admit to [facility] 01/2021 initially for unintentional overdose of her calcium channel blocker - noted to be in afib with RVR at the time. Found to be RSV+, had a cough at the time.      - admit 06/2021 with aflutter with RVR, cardioverted in ER    -transiently on flecanide started 03/2022 admission with PAF - later discontinued by EP. Recs were to start dofetilide but patietn wanted to monitor symptoms before starting.   Recent several week episode of afib with high heart rates, she captured on her cardia. Was taking additional prn metoprolol and diltiazem. Reports finally came out 08/07/22.  - no bleeding on xarelto. CrCl 111, she is on  daily    2. HTN - she is compliant with meds     3. CAD - nonobstructive disease by cath 2006 according to prior clinic notes - admit 09/2016 to [facility] with chest pain primarily related to palpitations.     - 05/2019 CT cardiac prior to ablation, nonobstructive CAD - no recent chest pain     4. Chronic hypoxic resp failure - chronically on 2.5L at home, at her baseline breathing - followed by pulmonary         5. Chronic HFpEF - edema overall controlled, at her baseline from breathing standpoint on home O2   SH: enjoys travelling to casinos, usually plays the penny slots. Travellling to [place] this summer, often travels to [place].        Past Medical History:  Diagnosis Date   Arthritis     pain and oa  right knee and pain in rt leg and foot--also pain left knee-but right is worse   Atrial fibrillation (HCC)    Back pain    lumbar bulging disk   CHF (congestive heart failure) (HCC)    Contact dermatitis    underside of left leg--always in the same spots--comes and goes   Diverticulosis    GERD (gastroesophageal reflux disease)    diet control   Hemorrhoids    Obesity    Persistent atrial fibrillation (HCC)      Allergies  Allergen Reactions   Codeine Shortness Of Breath and Itching   Meloxicam Other (See Comments)    Stomach iritation      Current Outpatient Medications  Medication Sig Dispense Refill   acetaminophen (TYLENOL) 325 MG tablet Take 650 mg by mouth every 6 (six) hours as needed for moderate pain or headache.     citalopram (CELEXA) 20 MG tablet Take 20 mg by mouth daily.     clonazePAM (KLONOPIN) 0.5 MG tablet Take 0.5 mg by mouth 2 (two) times daily as needed for anxiety.     diltiazem (CARDIZEM CD) 120 MG 24 hr capsule TAKE 1 CAPSULE BY MOUTH EVERY DAY 90 capsule 2   diltiazem (CARDIZEM) 30 MG tablet Take 1 tablet (30 mg total) by mouth daily as needed (afib). 30 tablet  6   furosemide (LASIX) 40 MG tablet TAKE 1 TABLET BY MOUTH DAILY. MAY TAKEN AN ADDITIONAL 1/2 TABLET DAILY AS NEEDED FOR WEIGHT >263 LBS (Patient taking differently: Take 40 mg by mouth daily. May take an additional 20mg  daily as needed for weight >263 lbs.) 135 tablet 2   ibuprofen (ADVIL) 200 MG tablet Take 200 mg by mouth every 6 (six) hours as needed for fever, headache or mild pain.     metoprolol tartrate (LOPRESSOR) 25 MG tablet Take 25 mg by mouth 2 (two) times daily.     rivaroxaban (XARELTO) 20 MG TABS tablet Take 1 tablet (20 mg total) by mouth daily. 90 tablet 1   No current facility-administered medications for this visit.     Past Surgical History:  Procedure Laterality Date   ATRIAL FIBRILLATION ABLATION N/A 06/07/2019   Procedure: ATRIAL FIBRILLATION ABLATION;   Surgeon: Brooke Range, MD;  Location: [facility] INVASIVE CV LAB;  Service: Cardiovascular;  Laterality: N/A;   CARDIOVERSION N/A 05/09/2019   Procedure: CARDIOVERSION;  Surgeon: Brooke Red, MD;  Location: [facility] ENDOSCOPY;  Service: Cardiovascular;  Laterality: N/A;   CATARACT EXTRACTION W/PHACO Left 02/22/2015   Procedure: CATARACT EXTRACTION PHACO AND INTRAOCULAR LENS PLACEMENT LEFT EYE CDE=5.41;  Surgeon: Brooke Payor, MD;  Location: [facility] ORS;  Service: Ophthalmology;  Laterality: Left;   CATARACT EXTRACTION W/PHACO Right 03/26/2015   Procedure: CATARACT EXTRACTION PHACO AND INTRAOCULAR LENS PLACEMENT (IOC);  Surgeon: Brooke Payor, MD;  Location: [facility] ORS;  Service: Ophthalmology;  Laterality: Right;  CDE:5.01   CHOLECYSTECTOMY     surgery for broken right elbow Right    TOTAL KNEE ARTHROPLASTY  11/03/2011   Procedure: TOTAL KNEE ARTHROPLASTY;  Surgeon: Brooke Drilling, MD;  Location: [facility] ORS;  Service: Orthopedics;  Laterality: Right;   TUBAL LIGATION       Allergies  Allergen Reactions   Codeine Shortness Of Breath and Itching   Meloxicam Other (See Comments)    Stomach iritation       Family History  Problem Relation Age of Onset   Diabetes Mother    Stroke Mother    Stroke Father      Social History Ms. Aron reports that she quit smoking about 26 years ago. Her smoking use included cigarettes. She started smoking about 65 years ago. She has a 38.00 pack-year smoking history. She has never used smokeless tobacco. Ms. Caulfield reports current alcohol use of about 4.0 standard drinks of alcohol per week.   Review of Systems CONSTITUTIONAL: No weight loss, fever, chills, weakness or fatigue.  HEENT: Eyes: No visual loss, blurred vision, double vision or yellow sclerae.No hearing loss, sneezing, congestion, runny nose or sore throat.  SKIN: No rash or itching.  CARDIOVASCULAR: per hpi RESPIRATORY: per hpi GASTROINTESTINAL: No anorexia, nausea, vomiting or diarrhea. No abdominal  pain or blood.  GENITOURINARY: No burning on urination, no polyuria NEUROLOGICAL: No headache, dizziness, syncope, paralysis, ataxia, numbness or tingling in the extremities. No change in bowel or bladder control.  MUSCULOSKELETAL: No muscle, back pain, joint pain or stiffness.  LYMPHATICS: No enlarged nodes. No history of splenectomy.  PSYCHIATRIC: No history of depression or anxiety.  ENDOCRINOLOGIC: No reports of sweating, cold or heat intolerance. No polyuria or polydipsia.  Marland Kitchen   Physical Examination Today's Vitals   08/21/22 1510  BP: 126/60  Pulse: 60  SpO2: 95%  Weight: 259 lb 3.2 oz (117.6 kg)  Height: 5\' 5"  (1.651 m)   Body mass index is 43.13 kg/m.  Gen: resting  comfortably, no acute distress HEENT: no scleral icterus, pupils equal round and reactive, no palptable cervical adenopathy,  CV: RRR, no m/r,g no jvd Resp: Clear to auscultation bilaterally GI: abdomen is soft, non-tender, non-distended, normal bowel sounds, no hepatosplenomegaly MSK: extremities are warm, no edema.  Skin: warm, no rash Neuro:  no focal deficits Psych: appropriate affect   Diagnostic Studies  12/2016 sleep study IMPRESSIONS - Minimal obstructive sleep apnea occurred during this study (AHI = 5.5/h) occurring primarily during supine sleep. - Mild central sleep apnea occurred during this study (CAI = 5.5/h). - The patient had minimal or no oxygen desaturation during the study (Min O2 = 100.00%) - The patient snored with Loud snoring volume. - EKG findings include Atrial Fibrillation. - Mild periodic limb movements of sleep occurred during the study. Associated arousals were significant.   DIAGNOSIS - Obstructive Sleep Apnea (327.23 [G47.33 ICD-10])   RECOMMENDATIONS - Very mild obstructive sleep apnea occurring primarily in supine position. - Recommend avoiding sleeping supine. - Avoid alcohol, sedatives and other CNS depressants that may worsen sleep apnea and disrupt normal sleep  architecture. - Sleep hygiene should be reviewed to assess factors that may improve sleep quality. - Weight management and regular exercise should be initiated or continued if appropriate. - Consider referral to ENT for evaluation of possible surgical causes of snoring.    11/2016 echo Study Conclusions   - Left ventricle: The cavity size was normal. Wall thickness was   normal. Systolic function was normal. The estimated ejection   fraction was in the Luna of 60% to 65%. Left ventricular   diastolic function parameters were normal. - Left atrium: The atrium was mildly to moderately dilated.   03/2021 echo 1. Left ventricular ejection fraction, by estimation, is 60 to 65%. The  left ventricle has normal function. The left ventricle has no regional  wall motion abnormalities. Left ventricular diastolic function could not  be evaluated.   2. Right ventricular systolic function is normal. The right ventricular  size is normal. There is normal pulmonary artery systolic pressure. The  estimated right ventricular systolic pressure is 18.1 mmHg.   3. The trivial pericardial effusion is anterior to the right ventricle.   4. The mitral valve is abnormal. Trivial mitral valve regurgitation.   5. The aortic valve is tricuspid. Aortic valve regurgitation is not  visualized.   6. The inferior vena cava is normal in size with greater than 50%  respiratory variability, suggesting right atrial pressure of 3 mmHg.     03/2022 echo 1. EF is challenging in afib. Left ventricular ejection fraction, by  estimation, is 60 to 65%. The left ventricle has normal function. The left  ventricle has no regional wall motion abnormalities. Left ventricular  diastolic parameters are indeterminate.   2. Right ventricular systolic function is normal. The right ventricular  size is normal. Tricuspid regurgitation signal is inadequate for assessing  PA pressure.   3. The mitral valve was not well visualized. No  evidence of mitral valve  regurgitation.   4. Aortic valve regurgitation is not visualized.   5. The inferior vena cava is normal in size with greater than 50%  respiratory variability, suggesting right atrial pressure of 3 mmHg.     Assessment and Plan  1. PAF/Aflutter/acquired thrombophilia - prior ablation for afib - recent recurrent afib for several weeks just resolved few weeks ago, back in SR - f/u back up with EP, at last visit discussions about dofetilide. I discussed I think  best option for her, only a matter of time for afib to reoccur and frequent and duration only likely to increase on current regimen - limited av nodal agent dosing as low normal HRs in SR - continue xarelto for stroke prevention     2. HTN - she is at goal, continue current meds   3. Chronic HFpEF - euvolemic, continue current meds      Antoine Poche, M.D..

## 2022-08-21 NOTE — Patient Instructions (Signed)
Medication Instructions:   Continue all current medications.     Labwork:  none  Testing/Procedures:  none  Follow-Up:  6 months   Any Other Special Instructions Will Be Listed Below (If Applicable). Please reschedule Dr. Nelly Laurence for the next month or so.    If you need a refill on your cardiac medications before your next appointment, please call your pharmacy.

## 2022-09-01 ENCOUNTER — Other Ambulatory Visit: Payer: Self-pay | Admitting: Cardiology

## 2022-09-16 ENCOUNTER — Emergency Department (HOSPITAL_COMMUNITY): Payer: Medicare Other

## 2022-09-16 ENCOUNTER — Encounter (HOSPITAL_COMMUNITY): Payer: Self-pay

## 2022-09-16 ENCOUNTER — Emergency Department (HOSPITAL_COMMUNITY)
Admission: EM | Admit: 2022-09-16 | Discharge: 2022-09-16 | Disposition: A | Discharge status: Home / Self Care | Payer: Medicare Other | Attending: Emergency Medicine | Admitting: Emergency Medicine

## 2022-09-16 ENCOUNTER — Other Ambulatory Visit: Payer: Self-pay

## 2022-09-16 DIAGNOSIS — E669 Obesity, unspecified: Secondary | ICD-10-CM | POA: Diagnosis not present

## 2022-09-16 DIAGNOSIS — I4891 Unspecified atrial fibrillation: Secondary | ICD-10-CM | POA: Diagnosis not present

## 2022-09-16 DIAGNOSIS — Z6841 Body Mass Index (BMI) 40.0 and over, adult: Secondary | ICD-10-CM | POA: Insufficient documentation

## 2022-09-16 DIAGNOSIS — I509 Heart failure, unspecified: Secondary | ICD-10-CM | POA: Insufficient documentation

## 2022-09-16 DIAGNOSIS — Z7901 Long term (current) use of anticoagulants: Secondary | ICD-10-CM | POA: Insufficient documentation

## 2022-09-16 DIAGNOSIS — R079 Chest pain, unspecified: Secondary | ICD-10-CM | POA: Diagnosis present

## 2022-09-16 LAB — BASIC METABOLIC PANEL
Anion gap: 11 (ref 5–15)
BUN: 13 mg/dL (ref 8–23)
CO2: 26 mmol/L (ref 22–32)
Calcium: 8.9 mg/dL (ref 8.9–10.3)
Chloride: 101 mmol/L (ref 98–111)
Creatinine, Ser: 0.89 mg/dL (ref 0.44–1.00)
GFR, Estimated: >60 mL/min (ref >60)
Glucose, Bld: 195 mg/dL — ABNORMAL HIGH (ref 70–99)
Potassium: 3.9 mmol/L (ref 3.5–5.1)
Sodium: 138 mmol/L (ref 135–145)

## 2022-09-16 LAB — CBC
HCT: 46.1 % — ABNORMAL HIGH (ref 36.0–46.0)
Hemoglobin: 14.2 g/dL (ref 12.0–15.0)
MCH: 27.1 pg (ref 26.0–34.0)
MCHC: 30.8 g/dL (ref 30.0–36.0)
MCV: 88 fL (ref 80.0–100.0)
Platelets: 418 10*3/uL — ABNORMAL HIGH (ref 150–400)
RBC: 5.24 MIL/uL — ABNORMAL HIGH (ref 3.87–5.11)
RDW: 14.2 % (ref 11.5–15.5)
WBC: 11.6 10*3/uL — ABNORMAL HIGH (ref 4.0–10.5)
nRBC: 0 % (ref 0.0–0.2)

## 2022-09-16 LAB — MAGNESIUM: Magnesium: 2 mg/dL (ref 1.7–2.4)

## 2022-09-16 LAB — TROPONIN I (HIGH SENSITIVITY): Troponin I (High Sensitivity): 9 ng/L (ref <18)

## 2022-09-16 MED ORDER — FENTANYL CITRATE PF 50 MCG/ML IJ SOSY
50.0000 ug | PREFILLED_SYRINGE | Freq: Once | INTRAMUSCULAR | Status: AC
  Administered 2022-09-16: 50 ug via INTRAVENOUS
  Filled 2022-09-16: qty 1

## 2022-09-16 MED ORDER — ETOMIDATE 2 MG/ML IV SOLN
INTRAVENOUS | Status: AC | PRN
  Administered 2022-09-16: 10 mg via INTRAVENOUS

## 2022-09-16 MED ORDER — ETOMIDATE 2 MG/ML IV SOLN
0.1500 mg/kg | Freq: Once | INTRAVENOUS | Status: DC
  Filled 2022-09-16: qty 10

## 2022-09-16 NOTE — ED Provider Notes (Signed)
[place] EMERGENCY DEPARTMENT AT [facility] Provider Note   CSN: [id number] Arrival date & time: 09/16/22  1021     History  Chief Complaint  Patient presents with   Abdominal Pain    Brooke Luna is a 77 y.o. female.  Patient is a 77 year old female with a past medical history of paroxysmal A-fib on Xarelto, CHF, chronic hypoxic respiratory failure on 2 L nasal cannula at baseline presenting to the emergency department with chest pain.  The patient states that she has had on and off chest pain with episodes of her heart racing for the last week.  She states that yesterday morning when she woke up her heart rate was in the 150s, she took her morning medications and it improved to the 50s to 80s.  She states when she woke up this morning her heart was again in the 150s.  She states that she has associated chest pain with some mild shortness of breath.  She states that she has nausea but denies any vomiting.  She denies any recent diarrhea.  She states she has not missed any medications including no missed doses of her anticoagulation.  Initially reported abdominal pain to triage but reports only chest pain to myself.  The history is provided by the patient and the spouse.  Abdominal Pain      Home Medications Prior to Admission medications   Medication Sig Start Date End Date Taking? Authorizing Provider  acetaminophen (TYLENOL) 325 MG tablet Take 650 mg by mouth every 6 (six) hours as needed for moderate pain or headache.    [provider]  citalopram (CELEXA) 20 MG tablet Take 20 mg by mouth daily. 10/28/12   [provider]  clonazePAM (KLONOPIN) 0.5 MG tablet Take 0.5 mg by mouth 2 (two) times daily as needed for anxiety.    [provider]  diltiazem (CARDIZEM CD) 120 MG 24 hr capsule TAKE 1 CAPSULE BY MOUTH EVERY DAY 05/19/22   Antoine Poche, MD  diltiazem (CARDIZEM) 30 MG tablet Take 1 tablet (30 mg total) by mouth daily as needed  (afib). 07/08/21   Antoine Poche, MD  furosemide (LASIX) 40 MG tablet Take 1 tablet (40 mg total) by mouth daily. May take an additional 20mg  daily as needed for weight >263 lbs. 09/01/22   Antoine Poche, MD  ibuprofen (ADVIL) 200 MG tablet Take 200 mg by mouth every 6 (six) hours as needed for fever, headache or mild pain.    [provider]  metoprolol tartrate (LOPRESSOR) 25 MG tablet Take 25 mg by mouth 2 (two) times daily. 05/19/22   [provider]  rivaroxaban (XARELTO) 20 MG TABS tablet Take 1 tablet (20 mg total) by mouth daily. 07/04/22   Antoine Poche, MD      Allergies    Codeine and Meloxicam    Review of Systems   Review of Systems  Gastrointestinal:  Positive for abdominal pain.    Physical Exam Updated Vital Signs BP 118/72   Pulse 70   Temp 98.7 F (37.1 C)   Resp 17   Ht 5\' 5"  (1.651 m)   Wt 117.6 kg   SpO2 97%   BMI 43.14 kg/m  Physical Exam Vitals and nursing note reviewed.  Constitutional:      General: She is not in acute distress.    Appearance: She is well-developed. She is obese.  HENT:     Head: Normocephalic and atraumatic.  Mouth/Throat:     Mouth: Mucous membranes are moist.     Pharynx: Oropharynx is clear.  Eyes:     Extraocular Movements: Extraocular movements intact.  Cardiovascular:     Rate and Rhythm: Tachycardia present. Rhythm irregular.     Heart sounds: Normal heart sounds.  Pulmonary:     Effort: Pulmonary effort is normal.     Breath sounds: Normal breath sounds.  Abdominal:     General: Abdomen is flat.     Palpations: Abdomen is soft.     Tenderness: There is no abdominal tenderness.  Skin:    General: Skin is warm and dry.  Neurological:     General: No focal deficit present.     Mental Status: She is alert and oriented to person, place, and time.  Psychiatric:        Mood and Affect: Mood normal.        Behavior: Behavior normal.     ED Results / Procedures / Treatments    Labs (all labs ordered are listed, but only abnormal results are displayed) Labs Reviewed  BASIC METABOLIC PANEL - Abnormal; Notable for the following components:      Result Value   Glucose, Bld 195 (*)    All other components within normal limits  CBC - Abnormal; Notable for the following components:   WBC 11.6 (*)    RBC 5.24 (*)    HCT 46.1 (*)    Platelets 418 (*)    All other components within normal limits  MAGNESIUM  TROPONIN I (HIGH SENSITIVITY)    EKG EKG Interpretation  Date/Time:  Tuesday Sep 16 2022 11:36:45 EDT Ventricular Rate:  83 PR Interval:  192 QRS Duration: 103 QT Interval:  359 QTC Calculation: 422 R Axis:   60 Text Interpretation: Sinus rhythm Minimal ST depression, diffuse leads Now in sinus rhythm compared to prior EKG today Confirmed by Elayne Snare ([id number]) on 09/16/2022 11:49:08 AM  Radiology DG Chest Portable 1 View  Result Date: 09/16/2022 CLINICAL DATA:  History EXAM: PORTABLE CHEST 1 VIEW COMPARISON:  CXR 06/25/22 FINDINGS: No pleural effusion. No pneumothorax. Cardiomegaly. Linear bibasilar airspace opacities are favored to atelectasis. Prominent bilateral interstitial opacities could represent pulmonary venous congestion or atypical infection. No radiographically apparent displaced rib fractures. Visualized upper abdomen is unremarkable IMPRESSION: Cardiomegaly with prominent bilateral interstitial opacities which could represent pulmonary venous congestion or atypical infection. Bibasilar atelectasis Electronically Signed   By: Lorenza Cambridge M.D.   On: 09/16/2022 11:08    Procedures .Cardioversion  Date/Time: 09/16/2022 11:43 AM  Performed by: Rexford Maus, DO Authorized by: Rexford Maus, DO   Consent:    Consent obtained:  Written   Consent given by:  Patient   Risks discussed:  Cutaneous burn, death, induced arrhythmia and pain   Alternatives discussed:  No treatment, rate-control medication and delayed  treatment Pre-procedure details:    Cardioversion basis:  Emergent   Rhythm:  Atrial fibrillation   Electrode placement:  Anterior-posterior Patient sedated: Yes. Refer to sedation procedure documentation for details of sedation.  Attempt one:    Cardioversion mode:  Synchronous   Waveform:  Monophasic   Shock (Joules):  200   Shock outcome:  Conversion to normal sinus rhythm Post-procedure details:    Patient status:  Awake   Patient tolerance of procedure:  Tolerated well, no immediate complications .Sedation  Date/Time: 09/16/2022 11:44 AM  Performed by: Rexford Maus, DO Authorized by: Rexford Maus, DO   Consent:  Consent obtained:  Written   Consent given by:  Patient   Risks discussed:  Allergic reaction, dysrhythmia, inadequate sedation, nausea, prolonged hypoxia resulting in organ damage, prolonged sedation necessitating reversal and respiratory compromise necessitating ventilatory assistance and intubation   Alternatives discussed:  Analgesia without sedation and anxiolysis Universal protocol:    Immediately prior to procedure, a time out was called: yes     Patient identity confirmed:  Verbally with patient Indications:    Procedure performed:  Cardioversion   Procedure necessitating sedation performed by:  Physician performing sedation Pre-sedation assessment:    Time since last food or drink:  4   NPO status caution: urgency dictates proceeding with non-ideal NPO status     ASA classification: class 2 - patient with mild systemic disease     Mouth opening:  3 or more finger widths   Thyromental distance:  4 finger widths   Mallampati score:  III - soft palate, base of uvula visible   Neck mobility: normal     Pre-sedation assessments completed and reviewed: pre-procedure airway patency not reviewed, pre-procedure cardiovascular function not reviewed, pre-procedure hydration status not reviewed, pre-procedure mental status not reviewed, pre-procedure  nausea and vomiting status not reviewed, pre-procedure pain level not reviewed, pre-procedure respiratory function not reviewed and pre-procedure temperature not reviewed   Immediate pre-procedure details:    Reassessment: Patient reassessed immediately prior to procedure     Reviewed: vital signs, relevant labs/tests and NPO status     Verified: bag valve mask available, emergency equipment available, intubation equipment available, IV patency confirmed, oxygen available, reversal medications available and suction available   Procedure details (see MAR for exact dosages):    Preoxygenation:  Nasal cannula   Sedation:  Etomidate   Intended level of sedation: deep   Analgesia:  Fentanyl   Intra-procedure monitoring:  Blood pressure monitoring, cardiac monitor, continuous capnometry, continuous pulse oximetry, frequent LOC assessments and frequent vital sign checks   Intra-procedure events: hypoxia     Intra-procedure management:  Airway repositioning and supplemental oxygen   Total Provider sedation time (minutes):  15 Post-procedure details:    Attendance: Constant attendance by certified staff until patient recovered     Recovery: Patient returned to pre-procedure baseline     Post-sedation assessments completed and reviewed: post-procedure airway patency not reviewed, post-procedure cardiovascular function not reviewed, post-procedure hydration status not reviewed, post-procedure mental status not reviewed, post-procedure nausea and vomiting status not reviewed, pain score not reviewed, post-procedure respiratory function not reviewed and post-procedure temperature not reviewed     Patient is stable for discharge or admission: yes     Procedure completion:  Tolerated well, no immediate complications     Medications Ordered in ED Medications  etomidate (AMIDATE) injection 17.64 mg (has no administration in time range)  fentaNYL (SUBLIMAZE) injection 50 mcg (50 mcg Intravenous Given 09/16/22  1134)  etomidate (AMIDATE) injection (10 mg Intravenous Given 09/16/22 1134)    ED Course/ Medical Decision Making/ A&P Clinical Course as of 09/16/22 1500  Tue Sep 16, 2022  1343 Patient's labs thus far are within normal range, remaining in sinus rhythm. Mag level pending. [VK]  1456 Mag level is normal.  Patient is chest pain-free and remains in sinus rhythm.  She stable for discharge home with outpatient follow-up. [VK]    Clinical Course User Index [VK] Rexford Maus, DO  Medical Decision Making This patient presents to the ED with chief complaint(s) of chest pain, palpitations with pertinent past medical history of A-fib, CHF, chronic hypoxic respiratory failure which further complicates the presenting complaint. The complaint involves an extensive differential diagnosis and also carries with it a high risk of complications and morbidity.    The differential diagnosis includes ACS, arrhythmia, anemia, electrolyte abnormality, dehydration, pneumonia, pneumothorax, pulmonary edema, pleural effusion  Additional history obtained: Additional history obtained from spouse Records reviewed outpatient cardiology records  ED Course and Reassessment: Since arrival to the emergency department she was tachycardic to the 150s and otherwise hemodynamically stable no acute distress.  EKG on arrival showed a supraventricular rhythm likely A-fib versus a flutter.  The patient has not missed any doses of anticoagulation and I discussed cardioversion and the patient is agreeable.  She will additionally have labs including mag to evaluate for possible cause of her A-fib and chest pain and she will be closely reassessed.  Independent labs interpretation:  The following labs were independently interpreted: within normal range  Independent visualization of imaging: - I independently visualized the following imaging with scope of interpretation limited to determining acute  life threatening conditions related to emergency care: CXR, which revealed no acute disease  Consultation: - Consulted or discussed management/test interpretation w/ external professional: N/A  Consideration for admission or further workup: Patient has no emergent conditions requiring admission or further work-up at this time and is stable for discharge home with primary care follow-up  Social Determinants of health: N/A    Amount and/or Complexity of Data Reviewed Labs: ordered. Radiology: ordered. ECG/medicine tests: ordered.  Risk Prescription drug management.          Final Clinical Impression(s) / ED Diagnoses Final diagnoses:  Atrial fibrillation with rapid ventricular response [facility])    Rx / DC Orders ED Discharge Orders          Ordered    Amb referral to AFIB Clinic        09/16/22 1103              Rexford Maus, DO 09/16/22 1500

## 2022-09-16 NOTE — Progress Notes (Signed)
RT at bedside for a cardioversion. Patient on [place] with ETCO2 monitoring. Airway equipment at patient room.

## 2022-09-16 NOTE — ED Triage Notes (Signed)
Pt c/o epigastric pain, SOB and tachycardiax1wk. Pt wears 2L 02 per [place] at baseline. Pt able to speak in complete sentences

## 2022-09-16 NOTE — Sedation Documentation (Signed)
Cardioversion at 200 j

## 2022-09-16 NOTE — ED Notes (Signed)
Patient Alert and oriented to baseline. Stable and ambulatory to baseline. Patient verbalized understanding of the discharge instructions.  Patient belongings were taken by the patient.   

## 2022-09-16 NOTE — Discharge Instructions (Signed)
You were seen in the emergency department for your chest pain and heart racing.  You were in rapid atrial fibrillation and we did perform a cardioversion to put you back into a normal rhythm.  You should continue to take your normal medications as prescribed and should follow-up with your cardiologist to have your symptoms rechecked and to determine if you need any further medication changes to prevent you from going back into A-fib.  You should return to the emergency department if you are having worsening chest pain, you feel like your heart is racing again and does not go back to normal with your medications or if you have any other new or concerning symptoms.

## 2022-09-26 ENCOUNTER — Telehealth: Payer: Self-pay | Admitting: Internal Medicine

## 2022-09-26 NOTE — Telephone Encounter (Signed)
PT wondering if she needs a CT scan before coming in for her Monday appt. I looked at AVS and active orders tab but no order for CT is in. Please call PT to advise. Sending this back [facility] because of time restraints, TY.   [phone]

## 2022-09-26 NOTE — Telephone Encounter (Signed)
Called and spoke w/ pt, after looking through [facility] notes and orders no scans/images were requested after LOV.   Pt verbalized understanding NFN att.

## 2022-09-28 NOTE — Progress Notes (Unsigned)
Brooke Luna, female    DOB: Feb 12, 1946    MRN: [id number]   Brief patient profile:  5  yowf quit smoking 1998  @ 140 lb no resp problems until around 2019-2020 at around 230 lb  and gradually gained up to 264 with proportionate doe  >>>  self referred  to pulmonary clinic 07/30/2020   Onset of afib around 2018   Covid Pos  Aug 2021    History of Present Illness  07/30/2020  Pulmonary/ 1st office eval/ [name] / [name]  Chief Complaint  Patient presents with   Pulmonary Consult    Referred by Dr [name]. Pt c/o DOE for the past few years, esp worse over the past year. She gets winded walking room to room at home, taking out the trash, showering.   Dyspnea:  [employer] pushes buggy, uses HC, one aisle is the limit = MMRC3 = can't walk 100 yards even at a slow pace at a flat grade s stopping due to sob   Cough: for about a year /   Sporadic/dry  Sleep: wakes up most nights in 45 degrees  recliner due to breathing or coughng  X 2017  SABA use: not helping  Rec To get the most out of exercise, you need to be continuously aware that you are short of breath, but never out of breath, for 30 minutes daily. PACE YOURSELF  Make sure you check your oxygen saturation at your highest level of activity to be sure it stays over 90% Pantoprazole (protonix) 40 mg   Take  30-60 min before first meal of the day and Pepcid (famotidine)  20 mg one @  bedtime until return to office  GERD diet reviewed, bed blocks rec  Please schedule a follow up office visit in 6 weeks, call sooner if needed > did not return as rec   Admit date: 04/25/2022 Discharge date: 04/26/2022 Recommendations for Outpatient Follow-Up:    Follow up with your primary care provider in one week.  Check CBC, BMP, magnesium in the next visit Follow-up with your cardiologist as outpatient to discuss about atrial fibrillation treatment,   Discharge Diagnosis:    Principal Problem:   Atrial fibrillation with  rapid ventricular response (HCC)   Persistent atrial fibrillation (HCC)   CHF (congestive heart failure) (HCC)   GERD (gastroesophageal reflux disease)     Discharge Condition: Improved.   Diet recommendation: Low sodium, heart healthy.     Wound care: None.   Code status: Full.     History of Present Illness:    Patient is 77 years old female with past medical history of persistent atrial fibrillation presented to hospital with palpitation chest tightness and fatigue with shortness of breath.  Patient had noted palpitations for the last 1 week or so.  Denied any dizziness or syncope, headache lightheadedness.  In the, ED, patient was noted to have atrial fibrillation with rapid ventricular response.  She was given 2 doses of 10 mg IV Cardizem push without much response so was started on Cardizem drip and considered for admission to the hospital.  Of note patient does have a history of cardioversion in the past and follows up with Dr. [name] as outpatient.  Patient was then admitted hospital for further evaluation and treatment.   Hospital Course:    Following conditions were addressed during hospitalization as listed below,   Atrial fibrillation with rapid ventricular response.  History of persistent atrial fibrillation.  Follows up with cardiology  to branch as outpatient.  Patient was symptomatic on presentation.   Patient received Cardizem bolus in the ED and was continued with Cardizem drip.  Cardiology was consulted due to history of cardioversion and was started on flecainide 75 mg twice daily along with Cardizem drip.  Patient did have 2D echocardiogram done 12/22 with LV ejection fraction of 60 to 65%.  Repeat 2D echocardiogram during this hospitalization showed LV ejection fraction of 60 to 65% with indeterminate diastolic parameters.   Patient is 120 mg long-acting and 30 mg as needed Cardizem at home.  Metoprolol dose was increased from 25 twice daily to 50 twice daily at this time.   Patient will continue to follow-up with cardiology as outpatient with this new regimen plus flecainide.  Continue with Xarelto.  TSH on 04/25/2022 was 4.0.  I spoke with Dr. [name] cardiology on-call at [facility] today regarding the patient and the plan is to continue flecainide and increased dose of metoprolol on discharge with outpatient electrophysiology/cardiology follow-up.   History of congestive heart failure Remained compensated.  2D echocardiogram with preserved LV function.   Disposition.  At this time, patient is stable for disposition home with outpatient PCP and cardiology follow-up.  Patient is aware of the need for follow-up with cardiology for definite atrial fibrillation management/follow-up    05/16/2022  re establish s/p admit ov/[place] [facility]/Cesario Weidinger re: f/u  sob/ nodules  maint on Trelegy no better   Chief Complaint  Patient presents with   Follow-up    Pt HFU for COPD exacerbation, also had ED visit for PNA. Imaging showed nodules needing to be f/u on  Dyspnea:  50 ft no better on trelegy / not much change on prednisone  Cough: none  Sleeping: 45 degrees recliner x 2017 sleep /sometimes with HA  SABA use: none  02: ever since d/c from [place] on 2-4lpm  Covid status: vax x 2 and infected x one  Rec Make sure you check your oxygen saturation  AT  your highest level of activity (not after you stop)   to be sure it stays over 90%   Please schedule a follow up office visit in 4 weeks, sooner if needed with cxr on return    06/25/2022  f/u ov/[place] [facility]/Kaelum Kissick re: doe/ obesity changes on cxr  maint on 02 only    Chief Complaint  Patient presents with   Follow-up    Breathing is about the same since last ov CXR done today  Has questions about blood work    Dyspnea:  house bound due to low 02 3lpm improved since dec  2023 but not as good a year prior to OV   Cough: none  Sleeping: 45 degrees since 2017  SABA use: none  02: 3lpm cont daytime/ 3lpm cont  at rest Covid status: 2 vax/ one infection  Rec Make sure you check your oxygen saturation  AT  your highest level of activity (not after you stop)   to be sure it stays over 90%       09/16/22 s/p cardioversion      09/29/2022  f/u ov/[place] office/Gissel Keilman re: doe/ obesity  maint on just 02 2lpm / 4lpm with activity   Chief Complaint  Patient presents with   Follow-up    Pt f/u states that her breathing is the same as LOV - she was recently in the ED for her afib and cardioversion performed   Dyspnea:  really limited by B knee pain /s/p R  TKR  [place] and ever since > amox/ pred  Cough: no am flares Sleeping: 45 degrees in recliner x sev years  SABA use: none  02: 2lpm sleeping and sitting and 4lpm shower  With 02 lpm      No obvious day to day or daytime variability or assoc excess/ purulent sputum or mucus plugs or hemoptysis or cp or chest tightness, subjective wheeze or overt sinus or hb symptoms.   *** without nocturnal  or early am exacerbation  of respiratory  c/o's or need for noct saba. Also denies any obvious fluctuation of symptoms with weather or environmental changes or other aggravating or alleviating factors except as outlined above   No unusual exposure hx or h/o childhood pna/ asthma or knowledge of premature birth.  Current Allergies, Complete Past Medical History, Past Surgical History, Family History, and Social History were reviewed in [employer] record.  ROS  The following are not active complaints unless bolded Hoarseness, sore throat, dysphagia, dental problems, itching, sneezing,  nasal congestion or discharge of excess mucus or purulent secretions, ear ache,   fever, chills, sweats, unintended wt loss or wt gain, classically pleuritic or exertional cp,  orthopnea pnd or arm/hand swelling  or leg swelling, presyncope, palpitations, abdominal pain, anorexia, nausea, vomiting, diarrhea  or change in bowel habits or  change in bladder habits, change in stools or change in urine, dysuria, hematuria,  rash, arthralgias, visual complaints, headache, numbness, weakness or ataxia or problems with walking or coordination,  change in mood or  memory.        Current Meds  Medication Sig   acetaminophen (TYLENOL) 325 MG tablet Take 650 mg by mouth every 6 (six) hours as needed for moderate pain or headache.   citalopram (CELEXA) 20 MG tablet Take 20 mg by mouth daily.   clonazePAM (KLONOPIN) 0.5 MG tablet Take 0.5 mg by mouth 2 (two) times daily as needed for anxiety.   diltiazem (CARDIZEM CD) 120 MG 24 hr capsule TAKE 1 CAPSULE BY MOUTH EVERY DAY   diltiazem (CARDIZEM) 30 MG tablet Take 1 tablet (30 mg total) by mouth daily as needed (afib).   furosemide (LASIX) 40 MG tablet Take 1 tablet (40 mg total) by mouth daily. May take an additional 20mg  daily as needed for weight >263 lbs.   ibuprofen (ADVIL) 200 MG tablet Take 200 mg by mouth every 6 (six) hours as needed for fever, headache or mild pain.   metoprolol tartrate (LOPRESSOR) 25 MG tablet Take 25 mg by mouth 2 (two) times daily.                     Past Medical History:  Diagnosis Date   Arthritis    pain and oa  right knee and pain in rt leg and foot--also pain left knee-but right is worse   Atrial fibrillation (HCC)    Back pain    lumbar bulging disk   Contact dermatitis    underside of left leg--always in the same spots--comes and goes   Diverticulosis    GERD (gastroesophageal reflux disease)    diet control   Hemorrhoids    Obesity    Persistent atrial fibrillation (HCC)       Objective:   Wts   09/29/2022         ***  06/25/2022       268  05/16/22 268 lb (121.6 kg)  05/01/22 271 lb (122.9 kg)  04/30/22 266 lb (120.7 kg)     Vital signs reviewed  09/29/2022  - Note at rest 02 sats  ***% on ***   General appearance:    ***              I personally reviewed images and agree with radiology impression as follows:    Chest CTa   04/30/22  No evidence of pulmonary embolism.  Scattered patchy airspace disease bilaterally, most prominent in the upper lobes right middle lobe, consistent with a multifocal infectious/inflammatory process. There are distinct nodular opacities in the left upper lobe and left lower lobe, measuring up to 7 mm and 10 mm respectively, which are indeterminate. Prominent bilateral hilar lymph nodes, favored to be reactive. Recommend follow-up chest CT in 3 months.       CXR PA and Lateral:   06/25/2022 :    I personally reviewed images and impression is as follows:     No acute findings          Assessment

## 2022-09-29 ENCOUNTER — Ambulatory Visit: Payer: Medicare Other | Admitting: Internal Medicine

## 2022-09-29 ENCOUNTER — Encounter: Payer: Self-pay | Admitting: Internal Medicine

## 2022-09-29 VITALS — BP 111/67 | HR 57 | Ht 65.0 in | Wt 262.0 lb

## 2022-09-29 DIAGNOSIS — J9611 Chronic respiratory failure with hypoxia: Secondary | ICD-10-CM

## 2022-09-29 DIAGNOSIS — R0609 Other forms of dyspnea: Secondary | ICD-10-CM

## 2022-09-29 NOTE — Patient Instructions (Addendum)
Make sure you check your oxygen saturation at your highest level of activity(NOT after you stop)  to be sure it stays over 90% and keep track of it at least once a week, more often if breathing getting worse, and let me know if losing ground. (Collect the dots to connect the dots approach)    Think about recumbent bicycle x up to 30 min at a time - it should be much easier for you than bearing weight on your joints   Follow up here is as needed

## 2022-09-30 ENCOUNTER — Other Ambulatory Visit: Payer: Self-pay | Admitting: Cardiology

## 2022-09-30 NOTE — Assessment & Plan Note (Signed)
Quit smoking 1998 at wt 140 Onset was 2019-20 on a background of wt gain up to 230 / orthopnea since 2016-17 - Covid Pos aug 2021  - PFT's 12/28/19 nl ex ERV 30% with DLCO 14.17 (71%) and corrected to 5.01 (122%) for alv vol  -  07/30/2020   Walked RA  approx   300 ft  @ moderate pace  stopped due to  Sob with sats 93%  - much worse since dx of CAP / Rapid afib > 02 dep at post hosp f/u 05/16/2022     Advised that combination of obesity with low ERV and DJD are going to gradually reduce her to W/c level if doesn't turn things around with wt loss and more consistent ex program than does not involve bearing wt eg recumbent bike

## 2022-09-30 NOTE — Assessment & Plan Note (Addendum)
D/c on 02 03/3022 p CAP and RAF 06/25/2022   Walked on 3 lpm POC  x  1  lap(s) =  approx 150  ft  @ slow pace, stopped due to sob  with lowest 02 sats 91% then dropped to 89% on lap 2 and required 4lpm POCto complete   Again advised: Make sure you check your oxygen saturation  AT  your highest level of activity (not after you stop)   to be sure it stays over 90% and adjust  02 flow upward to maintain this level if needed but remember to turn it back to previous settings when you stop (to conserve your supply).   Pulmonary f/u can be prn          Each maintenance medication was reviewed in detail including emphasizing most importantly the difference between maintenance and prns and under what circumstances the prns are to be triggered using an action plan format where appropriate.  Total time for H and P, chart review, counseling, reviewing 02 device(s) and generating customized AVS unique to this office visit / same day charting = 24 min

## 2022-09-30 NOTE — Telephone Encounter (Signed)
Prescription refill request for Xarelto received.  Indication: PAF Last office visit: 08/21/22  Brooke Ferry MD Weight: 117.6kg Age: 77 Scr: 0.89 on 09/16/22  Epic CrCl: 98.28  Based on above findings Xarelto 20mg  daily is the appropriate dose.  Refill approved.

## 2022-10-10 ENCOUNTER — Ambulatory Visit: Payer: Medicare Other | Attending: Cardiovascular Disease | Admitting: Cardiovascular Disease

## 2022-10-10 ENCOUNTER — Encounter: Payer: Self-pay | Admitting: Cardiovascular Disease

## 2022-10-10 VITALS — BP 130/78 | HR 105 | Ht 64.0 in | Wt 261.0 lb

## 2022-10-10 DIAGNOSIS — I10 Essential (primary) hypertension: Secondary | ICD-10-CM | POA: Diagnosis not present

## 2022-10-10 DIAGNOSIS — Z79899 Other long term (current) drug therapy: Secondary | ICD-10-CM | POA: Diagnosis not present

## 2022-10-10 DIAGNOSIS — I480 Paroxysmal atrial fibrillation: Secondary | ICD-10-CM | POA: Diagnosis not present

## 2022-10-10 MED ORDER — HYDROCORTISONE 0.5 % EX CREA: 1.0000 | TOPICAL_CREAM | Freq: Two times a day (BID) | CUTANEOUS | 0 refills | Status: DC | PRN

## 2022-10-10 MED ORDER — AMIODARONE HCL 200 MG PO TABS: ORAL_TABLET | ORAL | 6 refills | Status: DC

## 2022-10-10 NOTE — Progress Notes (Signed)
PCP: Ignatius Specking, MD Primary Cardiologist: Dr [name] Primary EP: Dr Nelly Laurence  Brooke Luna is a 77 y.o. female who presents today for routine electrophysiology followup.    She underwent AF and flutter ablation by Dr. Johney Frame in Feb 2021. She was admitted with AF in 03/2022 and started on flecainide. I discontinued this due to CAD diagnosed on cath in 2006. I recommended Tikosyn but she wanted to monitor. She has had recurrence of AF documented with a Kardia mobile device. I reviewed strips from her device today.   She is not very active.  + SOB with moderate activity.  Today, she denies symptoms of palpitations, chest pain,  lower extremity edema, dizziness, presyncope, or syncope.  The patient is otherwise without complaint today.   Past Medical History:  Diagnosis Date   Arthritis    pain and oa  right knee and pain in rt leg and foot--also pain left knee-but right is worse   Atrial fibrillation (HCC)    Back pain    lumbar bulging disk   CHF (congestive heart failure) (HCC)    Contact dermatitis    underside of left leg--always in the same spots--comes and goes   Diverticulosis    GERD (gastroesophageal reflux disease)    diet control   Hemorrhoids    Obesity    Persistent atrial fibrillation (HCC)    Past Surgical History:  Procedure Laterality Date   ATRIAL FIBRILLATION ABLATION N/A 06/07/2019   Procedure: ATRIAL FIBRILLATION ABLATION;  Surgeon: Hillis Range, MD;  Location: [facility] INVASIVE CV LAB;  Service: Cardiovascular;  Laterality: N/A;   CARDIOVERSION N/A 05/09/2019   Procedure: CARDIOVERSION;  Surgeon: Jodelle Red, MD;  Location: [facility] ENDOSCOPY;  Service: Cardiovascular;  Laterality: N/A;   CATARACT EXTRACTION W/PHACO Left 02/22/2015   Procedure: CATARACT EXTRACTION PHACO AND INTRAOCULAR LENS PLACEMENT LEFT EYE CDE=5.41;  Surgeon: Gemma Payor, MD;  Location: [facility] ORS;  Service: Ophthalmology;  Laterality: Left;   CATARACT EXTRACTION W/PHACO Right 03/26/2015    Procedure: CATARACT EXTRACTION PHACO AND INTRAOCULAR LENS PLACEMENT (IOC);  Surgeon: Gemma Payor, MD;  Location: [facility] ORS;  Service: Ophthalmology;  Laterality: Right;  CDE:5.01   CHOLECYSTECTOMY     surgery for broken right elbow Right    TOTAL KNEE ARTHROPLASTY  11/03/2011   Procedure: TOTAL KNEE ARTHROPLASTY;  Surgeon: Loanne Drilling, MD;  Location: [facility] ORS;  Service: Orthopedics;  Laterality: Right;   TUBAL LIGATION      ROS- all systems are reviewed and negatives except as per HPI above  Current Outpatient Medications  Medication Sig Dispense Refill   acetaminophen (TYLENOL) 325 MG tablet Take 650 mg by mouth every 6 (six) hours as needed for moderate pain or headache.     citalopram (CELEXA) 20 MG tablet Take 20 mg by mouth daily.     clonazePAM (KLONOPIN) 0.5 MG tablet Take 0.5 mg by mouth 2 (two) times daily as needed for anxiety.     diltiazem (CARDIZEM CD) 120 MG 24 hr capsule TAKE 1 CAPSULE BY MOUTH EVERY DAY 90 capsule 2   diltiazem (CARDIZEM) 30 MG tablet Take 1 tablet (30 mg total) by mouth daily as needed (afib). 30 tablet 6   furosemide (LASIX) 40 MG tablet Take 1 tablet (40 mg total) by mouth daily. May take an additional 20mg  daily as needed for weight >263 lbs. 135 tablet 3   ibuprofen (ADVIL) 200 MG tablet Take 200 mg by mouth every 6 (six) hours as needed for fever, headache or  mild pain.     metoprolol tartrate (LOPRESSOR) 25 MG tablet Take 25 mg by mouth 2 (two) times daily. Taking once Daily     XARELTO 20 MG TABS tablet TAKE ONE TABLET BY MOUTH EVERY DAY 90 tablet 1   No current facility-administered medications for this visit.    Physical Exam: Vitals:   10/10/22 1458  BP: 130/78  Pulse: (!) 105  SpO2: 97%  Weight: 261 lb (118.4 kg)  Height: 5\' 4"  (1.626 m)     Gen: Appears comfortable, well-nourished CV: RRR, trace dependent edema Pulm: breathing easily on supplemental oxygen   Wt Readings from Last 3 Encounters:  10/10/22 261 lb (118.4 kg)  09/29/22  262 lb (118.8 kg)  09/16/22 259 lb 4.2 oz (117.6 kg)    EKG tracing today show sinus tachycardia  Assessment and Plan:  Paroxysmal atrial fibrillation S/p ablation 05/2019 Flecainide discontinued due to diagnosis of CAD I have advised lifestyle modification.   She will almost certainly have additional afib without substantial weight loss and exercise Qtc 474 today - will start amiodarone. 200 BID for 2 weeks, then daily. I explained risks of lung, thyroid, liver toxicities Check CMP. Recent TSH reviewed  2. HTN Stable No change required today  3. Obesity Body mass index is 44.8 kg/m. Lifestyle modification advised  4. SOB Likely due largely to deconditioning Echo 03/2022 reviewed   Follow-up in AF clinic in 3 months  Maurice Small, MD 10/10/2022 3:43 PM

## 2022-10-10 NOTE — Patient Instructions (Addendum)
Medication Instructions:   Begin Amiodarone 200mg  twice a day x 2 weeks, then decrease to 200mg  daily thereafter  Hydrocortisone cream to area on back as needed  Continue all other medications.     Labwork:  CMET - order given  Office will contact with results via phone, letter or mychart.     Testing/Procedures:  none  Follow-Up:  1 month   Any Other Special Instructions Will Be Listed Below (If Applicable).   If you need a refill on your cardiac medications before your next appointment, please call your pharmacy.

## 2022-10-31 ENCOUNTER — Encounter: Payer: Self-pay | Admitting: Cardiovascular Disease

## 2022-10-31 ENCOUNTER — Ambulatory Visit: Payer: Medicare Other | Attending: Cardiovascular Disease | Admitting: Cardiovascular Disease

## 2022-10-31 VITALS — BP 124/64 | HR 70 | Ht 64.0 in | Wt 266.0 lb

## 2022-10-31 DIAGNOSIS — I480 Paroxysmal atrial fibrillation: Secondary | ICD-10-CM

## 2022-10-31 NOTE — Patient Instructions (Signed)
Medication Instructions:   Continue all current medications.   Labwork:  none  Testing/Procedures:  none  Follow-Up:  6 months   Any Other Special Instructions Will Be Listed Below (If Applicable).  Nurse visit in 2 weeks for EKG only.   If you need a refill on your cardiac medications before your next appointment, please call your pharmacy.

## 2022-10-31 NOTE — Progress Notes (Signed)
PCP: Ignatius Specking, MD Primary Cardiologist: Dr [name] Primary EP: Dr Nelly Laurence  Brooke Luna is a 77 y.o. female who presents today for routine electrophysiology followup.    She underwent AF and flutter ablation by Dr. Johney Frame in Feb 2021. She was admitted with AF in 03/2022 and started on flecainide. I discontinued this due to CAD diagnosed on cath in 2006. I recommended Tikosyn but she wanted to monitor. She has had recurrence of AF documented with a Kardia mobile device.   She is not very active.  + SOB with moderate activity.  Today, she denies symptoms of palpitations, chest pain,  lower extremity edema, dizziness, presyncope, or syncope.  The patient is otherwise without complaint today.   I saw her just a few weeks ago.  After a long discussion at that visit we decided to start amiodarone.  She did not start the medication, however.  She had some concerns about photosensitivity.   Past Medical History:  Diagnosis Date   Arthritis    pain and oa  right knee and pain in rt leg and foot--also pain left knee-but right is worse   Atrial fibrillation (HCC)    Back pain    lumbar bulging disk   CHF (congestive heart failure) (HCC)    Contact dermatitis    underside of left leg--always in the same spots--comes and goes   Diverticulosis    GERD (gastroesophageal reflux disease)    diet control   Hemorrhoids    Obesity    Persistent atrial fibrillation (HCC)    Past Surgical History:  Procedure Laterality Date   ATRIAL FIBRILLATION ABLATION N/A 06/07/2019   Procedure: ATRIAL FIBRILLATION ABLATION;  Surgeon: Hillis Range, MD;  Location: [facility];  Service: Cardiovascular;  Laterality: N/A;   CARDIOVERSION N/A 05/09/2019   Procedure: CARDIOVERSION;  Surgeon: Jodelle Red, MD;  Location: [facility] ENDOSCOPY;  Service: Cardiovascular;  Laterality: N/A;   CATARACT EXTRACTION W/PHACO Left 02/22/2015   Procedure: CATARACT EXTRACTION PHACO AND INTRAOCULAR LENS PLACEMENT  LEFT EYE CDE=5.41;  Surgeon: Gemma Payor, MD;  Location: [facility];  Service: Ophthalmology;  Laterality: Left;   CATARACT EXTRACTION W/PHACO Right 03/26/2015   Procedure: CATARACT EXTRACTION PHACO AND INTRAOCULAR LENS PLACEMENT (IOC);  Surgeon: Gemma Payor, MD;  Location: [facility];  Service: Ophthalmology;  Laterality: Right;  CDE:5.01   CHOLECYSTECTOMY     surgery for broken right elbow Right    TOTAL KNEE ARTHROPLASTY  11/03/2011   Procedure: TOTAL KNEE ARTHROPLASTY;  Surgeon: Loanne Drilling, MD;  Location: [facility];  Service: Orthopedics;  Laterality: Right;   TUBAL LIGATION      ROS- all systems are reviewed and negatives except as per HPI above  Current Outpatient Medications  Medication Sig Dispense Refill   acetaminophen (TYLENOL) 325 MG tablet Take 650 mg by mouth every 6 (six) hours as needed for moderate pain or headache.     amiodarone (PACERONE) 200 MG tablet Take 1 tablet (200 mg total) by mouth 2 (two) times daily for 14 days, THEN 1 tablet (200 mg total) daily. 58 tablet 6   citalopram (CELEXA) 20 MG tablet Take 20 mg by mouth daily.     clonazePAM (KLONOPIN) 0.5 MG tablet Take 0.5 mg by mouth 2 (two) times daily as needed for anxiety.     cyanocobalamin (VITAMIN B12) 1000 MCG tablet Take 1,000 mcg by mouth daily.     diltiazem (CARDIZEM CD) 120 MG 24 hr capsule TAKE 1 CAPSULE BY MOUTH EVERY  DAY 90 capsule 2   diltiazem (CARDIZEM) 30 MG tablet Take 1 tablet (30 mg total) by mouth daily as needed (afib). 30 tablet 6   furosemide (LASIX) 40 MG tablet Take 1 tablet (40 mg total) by mouth daily. May take an additional 20mg  daily as needed for weight >263 lbs. 135 tablet 3   hydrocortisone cream 0.5 % Apply 1 Application topically 2 (two) times daily as needed for itching. 30 g 0   ibuprofen (ADVIL) 200 MG tablet Take 200 mg by mouth every 6 (six) hours as needed for fever, headache or mild pain.     metoprolol tartrate (LOPRESSOR) 25 MG tablet Take 25 mg by mouth 2 (two) times daily. Taking  once Daily     XARELTO 20 MG TABS tablet TAKE ONE TABLET BY MOUTH EVERY DAY 90 tablet 1   No current facility-administered medications for this visit.    Physical Exam: Vitals:   10/31/22 1315  Weight: 266 lb (120.7 kg)  Height: 5\' 4"  (1.626 m)     Gen: Appears comfortable, well-nourished CV: RRR, trace dependent edema Pulm: breathing easily on supplemental oxygen   Wt Readings from Last 3 Encounters:  10/31/22 266 lb (120.7 kg)  10/10/22 261 lb (118.4 kg)  09/29/22 262 lb (118.8 kg)    EKG tracing today show sinus tachycardia  Assessment and Plan:  Paroxysmal atrial fibrillation S/p ablation 05/2019 Flecainide discontinued due to diagnosis of CAD I have advised lifestyle modification.   She will almost certainly have additional afib without substantial weight loss and exercise 6/19, 04/25/2022 labs reviewed Will start amiodarone as an previously.  Follow-up in 2 weeks for EKG.  Follow-up with me in 6 months  2. HTN Stable No change required today  3. Obesity Body mass index is 45.66 kg/m. Lifestyle modification advised  4. SOB Likely due largely to deconditioning Echo 03/2022 reviewed     Maurice Small, MD 10/31/2022 1:17 PM

## 2022-11-13 ENCOUNTER — Ambulatory Visit: Payer: Medicare Other | Attending: Cardiology | Admitting: *Deleted

## 2022-11-13 DIAGNOSIS — Z79899 Other long term (current) drug therapy: Secondary | ICD-10-CM

## 2022-11-13 DIAGNOSIS — I480 Paroxysmal atrial fibrillation: Secondary | ICD-10-CM

## 2022-11-13 NOTE — Progress Notes (Signed)
Patient in office for EKG as she was just started on Amiodarone.  States she has been feeing good lately.    She will change to daily on her Amiodarone this Saturday.    Has not had to use any of her as needed Diltiazem.   States that she has only been taking her Lopressor 25mg  DAILY instead of twice a day as prescribed.  Has been taking this way for about 1 month, but forgot to tell the doctor.     States her resting HR is usually running in the 50's & BP running 160/70 - does mostly take these readings in the evenings.

## 2022-11-28 ENCOUNTER — Ambulatory Visit: Payer: Medicare Other | Admitting: Cardiovascular Disease

## 2022-12-27 ENCOUNTER — Other Ambulatory Visit: Payer: Self-pay | Admitting: Cardiology

## 2022-12-30 NOTE — Telephone Encounter (Signed)
Prescription refill request for Xarelto received.  Indication: PAF Last office visit: 10/31/22  A Mealor MD Weight: 120.7kg Age: 77 Scr: 0.73 on 10/15/22  Epic CrCl: 122.97  Based on above findings Xarelto 20mg  daily is the appropriate dose.  Refill approved.

## 2023-02-12 ENCOUNTER — Other Ambulatory Visit: Payer: Self-pay | Admitting: Cardiology

## 2023-02-24 ENCOUNTER — Ambulatory Visit: Payer: Medicare Other | Attending: Cardiology | Admitting: Cardiology

## 2023-02-24 ENCOUNTER — Encounter: Payer: Self-pay | Admitting: Cardiology

## 2023-02-24 VITALS — BP 130/82 | HR 67 | Ht 65.0 in | Wt 225.8 lb

## 2023-02-24 DIAGNOSIS — I10 Essential (primary) hypertension: Secondary | ICD-10-CM

## 2023-02-24 DIAGNOSIS — I5032 Chronic diastolic (congestive) heart failure: Secondary | ICD-10-CM

## 2023-02-24 DIAGNOSIS — I480 Paroxysmal atrial fibrillation: Secondary | ICD-10-CM | POA: Diagnosis not present

## 2023-02-24 NOTE — Patient Instructions (Signed)
Medication Instructions:  Continue all current medications.   Labwork: none  Testing/Procedures: none  Follow-Up: 6 months   Any Other Special Instructions Will Be Listed Below (If Applicable).   If you need a refill on your cardiac medications before your next appointment, please call your pharmacy.  

## 2023-02-24 NOTE — Progress Notes (Signed)
Clinical Summary Ms. Zellman is a 77 y.o.female seen today for follow up of the following medical problems.    1. PAF/Aflutter - diagnosed 12/2014 during hospital admission at [facility] - followed by Dr Johney Frame, she is s/p afib ablation 05/2019   - 11/2019 had admission with atrial flutter. Incidetnal COVID + at the time. She was rate controlled, at follow ups had converted back to SR - ER visit 07/17/20 with palpitations,EKG with aflutter with variable conduction.  - rates were able to be controlled in ER, discharged   - admit to [facility] 01/2021 initially for unintentional overdose of her calcium channel blocker - noted to be in afib with RVR at the time. Found to be RSV+, had a cough at the time.      - admit 06/2021 with aflutter with RVR, cardioverted in ER     -transiently on flecanide started 03/2022 admission with PAF - later discontinued by EP due tp CAD. Recs were to start dofetilide but patietn wanted to monitor symptoms before starting.   - has since followed with EP, started amiodarone.  - started amio - no recent palpitatons.            2. HTN - she is compliant with meds     3. CAD - nonobstructive disease by cath 2006 according to prior clinic notes - admit 09/2016 to [facility] with chest pain primarily related to palpitations.      - 05/2019 CT cardiac prior to ablation, nonobstructive CAD - no recent chest pains     4. Chronic hypoxic resp failure - chronically on 3L at home, at her baseline breathing - followed by pulmonary          5. Chronic HFpEF - edema overall controlled, at her baseline from breathing standpoint on home O2 - no recent edema, taking lasix    SH: enjoys travelling to casinos, usually plays the penny slots. Travellling to [place] this summer, often travels to [place].      Past Medical History:  Diagnosis Date   Arthritis    pain and oa  right knee and pain in rt leg and foot--also pain left knee-but right is  worse   Atrial fibrillation (HCC)    Back pain    lumbar bulging disk   CHF (congestive heart failure) (HCC)    Contact dermatitis    underside of left leg--always in the same spots--comes and goes   Diverticulosis    GERD (gastroesophageal reflux disease)    diet control   Hemorrhoids    Obesity    Persistent atrial fibrillation (HCC)      Allergies  Allergen Reactions   Codeine Itching and Shortness Of Breath   Meloxicam Other (See Comments)    Stomach iritation      Current Outpatient Medications  Medication Sig Dispense Refill   acetaminophen (TYLENOL) 325 MG tablet Take 650 mg by mouth every 6 (six) hours as needed for moderate pain or headache.     amiodarone (PACERONE) 200 MG tablet Take 1 tablet (200 mg total) by mouth 2 (two) times daily for 14 days, THEN 1 tablet (200 mg total) daily. 58 tablet 6   citalopram (CELEXA) 20 MG tablet Take 20 mg by mouth daily.     clonazePAM (KLONOPIN) 0.5 MG tablet Take 0.5 mg by mouth 2 (two) times daily as needed for anxiety.     cyanocobalamin (VITAMIN B12) 1000 MCG tablet Take 1,000 mcg by mouth daily.  diltiazem (CARDIZEM CD) 120 MG 24 hr capsule TAKE 1 CAPSULE BY MOUTH EVERY DAY 90 capsule 0   diltiazem (CARDIZEM) 30 MG tablet Take 1 tablet (30 mg total) by mouth daily as needed (afib). 30 tablet 6   furosemide (LASIX) 40 MG tablet Take 1 tablet (40 mg total) by mouth daily. May take an additional 20mg  daily as needed for weight >263 lbs. 135 tablet 3   hydrocortisone cream 0.5 % Apply 1 Application topically 2 (two) times daily as needed for itching. 30 g 0   ibuprofen (ADVIL) 200 MG tablet Take 200 mg by mouth every 6 (six) hours as needed for fever, headache or mild pain.     metoprolol tartrate (LOPRESSOR) 25 MG tablet Take 25 mg by mouth every morning.     XARELTO 20 MG TABS tablet TAKE ONE TABLET BY MOUTH EVERY DAY 90 tablet 1   No current facility-administered medications for this visit.     Past Surgical History:   Procedure Laterality Date   ATRIAL FIBRILLATION ABLATION N/A 06/07/2019   Procedure: ATRIAL FIBRILLATION ABLATION;  Surgeon: Hillis Range, MD;  Location: [facility] INVASIVE CV LAB;  Service: Cardiovascular;  Laterality: N/A;   CARDIOVERSION N/A 05/09/2019   Procedure: CARDIOVERSION;  Surgeon: Jodelle Red, MD;  Location: [facility] ENDOSCOPY;  Service: Cardiovascular;  Laterality: N/A;   CATARACT EXTRACTION W/PHACO Left 02/22/2015   Procedure: CATARACT EXTRACTION PHACO AND INTRAOCULAR LENS PLACEMENT LEFT EYE CDE=5.41;  Surgeon: Gemma Payor, MD;  Location: [facility];  Service: Ophthalmology;  Laterality: Left;   CATARACT EXTRACTION W/PHACO Right 03/26/2015   Procedure: CATARACT EXTRACTION PHACO AND INTRAOCULAR LENS PLACEMENT (IOC);  Surgeon: Gemma Payor, MD;  Location: [facility];  Service: Ophthalmology;  Laterality: Right;  CDE:5.01   CHOLECYSTECTOMY     surgery for broken right elbow Right    TOTAL KNEE ARTHROPLASTY  11/03/2011   Procedure: TOTAL KNEE ARTHROPLASTY;  Surgeon: Loanne Drilling, MD;  Location: [facility];  Service: Orthopedics;  Laterality: Right;   TUBAL LIGATION       Allergies  Allergen Reactions   Codeine Itching and Shortness Of Breath   Meloxicam Other (See Comments)    Stomach iritation       Family History  Problem Relation Age of Onset   Diabetes Mother    Stroke Mother    Stroke Father      Social History Ms. Polio reports that she quit smoking about 26 years ago. Her smoking use included cigarettes. She started smoking about 65 years ago. She has a 38.8 pack-year smoking history. She has never used smokeless tobacco. Ms. Lassere reports current alcohol use of about 4.0 standard drinks of alcohol per week.   Review of Systems CONSTITUTIONAL: No weight loss, fever, chills, weakness or fatigue.  HEENT: Eyes: No visual loss, blurred vision, double vision or yellow sclerae.No hearing loss, sneezing, congestion, runny nose or sore throat.  SKIN: No rash or itching.   CARDIOVASCULAR: per hpi RESPIRATORY: per hpi.  GASTROINTESTINAL: No anorexia, nausea, vomiting or diarrhea. No abdominal pain or blood.  GENITOURINARY: No burning on urination, no polyuria NEUROLOGICAL: No headache, dizziness, syncope, paralysis, ataxia, numbness or tingling in the extremities. No change in bowel or bladder control.  MUSCULOSKELETAL: No muscle, back pain, joint pain or stiffness.  LYMPHATICS: No enlarged nodes. No history of splenectomy.  PSYCHIATRIC: No history of depression or anxiety.  ENDOCRINOLOGIC: No reports of sweating, cold or heat intolerance. No polyuria or polydipsia.  Marland Kitchen   Physical Examination Today's Vitals  02/24/23 1407 02/24/23 1412  BP: (!) 148/82 130/82  Pulse: 67   SpO2: 94%   Weight: 225 lb 12.8 oz (102.4 kg)   Height: 5\' 5"  (1.651 m)    Body mass index is 37.58 kg/m.  Gen: resting comfortably, no acute distress HEENT: no scleral icterus, pupils equal round and reactive, no palptable cervical adenopathy,  CV: RRR, no m/rg, no jvd Resp: Clear to auscultation bilaterally GI: abdomen is soft, non-tender, non-distended, normal bowel sounds, no hepatosplenomegaly MSK: extremities are warm, no edema.  Skin: warm, no rash Neuro:  no focal deficits Psych: appropriate affect   Diagnostic Studies  12/2016 sleep study IMPRESSIONS - Minimal obstructive sleep apnea occurred during this study (AHI = 5.5/h) occurring primarily during supine sleep. - Mild central sleep apnea occurred during this study (CAI = 5.5/h). - The patient had minimal or no oxygen desaturation during the study (Min O2 = 100.00%) - The patient snored with Loud snoring volume. - EKG findings include Atrial Fibrillation. - Mild periodic limb movements of sleep occurred during the study. Associated arousals were significant.   DIAGNOSIS - Obstructive Sleep Apnea (327.23 [G47.33 ICD-10])   RECOMMENDATIONS - Very mild obstructive sleep apnea occurring primarily in supine  position. - Recommend avoiding sleeping supine. - Avoid alcohol, sedatives and other CNS depressants that may worsen sleep apnea and disrupt normal sleep architecture. - Sleep hygiene should be reviewed to assess factors that may improve sleep quality. - Weight management and regular exercise should be initiated or continued if appropriate. - Consider referral to ENT for evaluation of possible surgical causes of snoring.    11/2016 echo Study Conclusions   - Left ventricle: The cavity size was normal. Wall thickness was   normal. Systolic function was normal. The estimated ejection   fraction was in the range of 60% to 65%. Left ventricular   diastolic function parameters were normal. - Left atrium: The atrium was mildly to moderately dilated.   03/2021 echo 1. Left ventricular ejection fraction, by estimation, is 60 to 65%. The  left ventricle has normal function. The left ventricle has no regional  wall motion abnormalities. Left ventricular diastolic function could not  be evaluated.   2. Right ventricular systolic function is normal. The right ventricular  size is normal. There is normal pulmonary artery systolic pressure. The  estimated right ventricular systolic pressure is 18.1 mmHg.   3. The trivial pericardial effusion is anterior to the right ventricle.   4. The mitral valve is abnormal. Trivial mitral valve regurgitation.   5. The aortic valve is tricuspid. Aortic valve regurgitation is not  visualized.   6. The inferior vena cava is normal in size with greater than 50%  respiratory variability, suggesting right atrial pressure of 3 mmHg.      03/2022 echo 1. EF is challenging in afib. Left ventricular ejection fraction, by  estimation, is 60 to 65%. The left ventricle has normal function. The left  ventricle has no regional wall motion abnormalities. Left ventricular  diastolic parameters are indeterminate.   2. Right ventricular systolic function is normal. The right  ventricular  size is normal. Tricuspid regurgitation signal is inadequate for assessing  PA pressure.   3. The mitral valve was not well visualized. No evidence of mitral valve  regurgitation.   4. Aortic valve regurgitation is not visualized.   5. The inferior vena cava is normal in size with greater than 50%  respiratory variability, suggesting right atrial pressure of 3 mmHg.  Assessment and Plan  1. PAF/Aflutter/acquired thrombophilia - prior ablation for afib - recent recurrent afib for several weeks just resolved few weeks ago, back in SR - startd on amio, has been doing well without symptoms - continue current meds including xarelto for stroke prevention     2. HTN - at goal, continue curren meds   3. Chronic HFpEF -no symptoms, she is euvolemic today. Continue current meds      Antoine Poche, M.D.

## 2023-04-03 ENCOUNTER — Ambulatory Visit: Payer: Medicare Other | Admitting: Cardiovascular Disease

## 2023-05-11 ENCOUNTER — Other Ambulatory Visit: Payer: Self-pay | Admitting: Cardiology

## 2023-06-10 ENCOUNTER — Ambulatory Visit: Payer: Self-pay | Admitting: Internal Medicine

## 2023-06-10 NOTE — Telephone Encounter (Addendum)
Chief Complaint: low oxygen level Symptoms: hypoxia with exertion, mild-mod SOB Frequency: x 1-2 week Pertinent Negatives: Patient denies wheezing, fever, URI sx, chest pain Disposition: [] ED /[] Urgent Care (no appt availability in office) / [x] Appointment(In office/virtual)/ []  [facility]/ [] Home Care/ [] Refused Recommended Disposition /[] [facility]/ []  Follow-up with PCP Additional Notes: Patient c/o hypoxia with exertion x1-2 weeks. Hx of COPD, CHF per chart. Patient reports that she is currently on 3L O2 and notices that her SpO2 drops to mid 70's with exertion, at baseline she is mid 80's with exertion. Currently at rest she is 97% with 3L. Denies wheezing, fevers, URI sx. Pulmonology PAA Fredric Mare notified of disposition, patient transferred for scheduling. Patient verbalized understanding.    Reason for Disposition  [1] MILD difficulty breathing (e.g., minimal/no SOB at rest, SOB with walking) AND [2] worse than normal  Answer Assessment - Initial Assessment Questions "Chief Complaint (e.g., cough, sob, wheezing, fever, chills, sweat or additional symptoms) *Go to specific symptom protocol after initial questions. hypoxia  "How long have symptoms been present?" A week ago Have you tested for COVID or Flu? Note: If not, ask patient if a home test can be taken. If so, instruct patient to call back for positive results. No  MEDICINES:   "Have you used your inhalers/maintenance medication?" Not on any  OXYGEN: "Do you wear supplemental oxygen?" Yes If yes, "How many liters are you supposed to use?" 3L "Do you monitor your oxygen levels?" Yes If yes, "What is your reading (oxygen level) today?" Without exertion sitting in recliner 94-95% With exertion used to be 85-86% Drops down to 74% with exertion  Currently: 97% 73 pulse   1. MAIN CONCERN OR SYMPTOM: "What's your main concern?" (e.g., low oxygen level, breathing difficulty) "What question do you  have?"     hypoxia 2. ONSET: "When did the  hypoxia with exertion  start?"      2 weeks 3. OXYGEN THERAPY:    - "Do you currently use home oxygen?"    - If Yes, ask: "What is your oxygen source?" (e.g., O2 tank, O2 concentrator).    - If Yes, ask: "How do you get the oxygen?" (e.g., nasal prongs, face mask).    - If Yes, ask: "How much oxygen are you supposed to use?" (e.g., 1-2 L [place])     3L 4.  OXYGEN EQUIPMENT:  "Are you having any trouble with your oxygen equipment?"  (e.g., cannula, mask, tubing, tank, concentrator)     no  7. VSS MONITORING: "Do you monitor/measure your oxygen level or vital signs?" (e.g., yes, no, measurements are automatically sent to provider/call center). Document CURRENT and NORMAL BASELINE values if available.     -  P: "What is your pulse rate per minute?"   -  RR: "What is your respiratory rate per minute?"     Pulse is in 60's, with exertion 90s  8. BREATHING DIFFICULTY: "Are you having any difficulty breathing?" If Yes, ask: "How bad is it?"  (e.g., none, mild, moderate, severe)    - MILD: No SOB at rest, mild SOB with walking, speaks normally in sentences, able to lie down, no retractions, pulse < 100.    - MODERATE: SOB at rest, SOB with minimal exertion and prefers to sit, cannot lie down flat, speaks in phrases, mild retractions, audible wheezing, pulse 100-120.    - SEVERE: Very SOB at rest, speaks in single words, struggling to breathe, sitting hunched forward, retractions, pulse > 120  Mild-Moderate 9. OTHER SYMPTOMS: "Do you have any other symptoms?" (e.g., fever, change in sputum)     Some Dry cough  10. SMOKING: "Do you smoke currently?" "Is there anyone that smokes around you?"  (Note: smoking around oxygen is dangerous!)       no  Protocols used: Oxygen Monitoring and Hypoxia-A-AH

## 2023-06-11 ENCOUNTER — Encounter: Payer: Self-pay | Admitting: Internal Medicine

## 2023-06-11 ENCOUNTER — Ambulatory Visit: Payer: Medicare Other | Admitting: Internal Medicine

## 2023-06-11 ENCOUNTER — Ambulatory Visit (HOSPITAL_COMMUNITY)
Admission: RE | Admit: 2023-06-11 | Discharge: 2023-06-11 | Disposition: A | Discharge status: Home / Self Care | Payer: Medicare Other | Source: Ambulatory Visit | Attending: Internal Medicine | Admitting: Internal Medicine

## 2023-06-11 ENCOUNTER — Telehealth: Payer: Self-pay | Admitting: Internal Medicine

## 2023-06-11 VITALS — BP 127/48 | HR 62 | Ht 65.0 in | Wt 257.0 lb

## 2023-06-11 DIAGNOSIS — L209 Atopic dermatitis, unspecified: Secondary | ICD-10-CM | POA: Insufficient documentation

## 2023-06-11 DIAGNOSIS — L853 Xerosis cutis: Secondary | ICD-10-CM | POA: Insufficient documentation

## 2023-06-11 DIAGNOSIS — R0609 Other forms of dyspnea: Secondary | ICD-10-CM | POA: Diagnosis present

## 2023-06-11 DIAGNOSIS — G4700 Insomnia, unspecified: Secondary | ICD-10-CM | POA: Insufficient documentation

## 2023-06-11 DIAGNOSIS — K649 Unspecified hemorrhoids: Secondary | ICD-10-CM | POA: Insufficient documentation

## 2023-06-11 DIAGNOSIS — Z96659 Presence of unspecified artificial knee joint: Secondary | ICD-10-CM | POA: Insufficient documentation

## 2023-06-11 DIAGNOSIS — L309 Dermatitis, unspecified: Secondary | ICD-10-CM | POA: Insufficient documentation

## 2023-06-11 DIAGNOSIS — E559 Vitamin D deficiency, unspecified: Secondary | ICD-10-CM | POA: Insufficient documentation

## 2023-06-11 DIAGNOSIS — E2839 Other primary ovarian failure: Secondary | ICD-10-CM | POA: Insufficient documentation

## 2023-06-11 DIAGNOSIS — R5383 Other fatigue: Secondary | ICD-10-CM | POA: Insufficient documentation

## 2023-06-11 DIAGNOSIS — M2550 Pain in unspecified joint: Secondary | ICD-10-CM | POA: Insufficient documentation

## 2023-06-11 DIAGNOSIS — Z8719 Personal history of other diseases of the digestive system: Secondary | ICD-10-CM | POA: Insufficient documentation

## 2023-06-11 DIAGNOSIS — F32A Depression, unspecified: Secondary | ICD-10-CM | POA: Insufficient documentation

## 2023-06-11 DIAGNOSIS — J9611 Chronic respiratory failure with hypoxia: Secondary | ICD-10-CM

## 2023-06-11 DIAGNOSIS — Z8639 Personal history of other endocrine, nutritional and metabolic disease: Secondary | ICD-10-CM | POA: Insufficient documentation

## 2023-06-11 DIAGNOSIS — Z6841 Body Mass Index (BMI) 40.0 and over, adult: Secondary | ICD-10-CM | POA: Insufficient documentation

## 2023-06-11 DIAGNOSIS — E7800 Pure hypercholesterolemia, unspecified: Secondary | ICD-10-CM | POA: Insufficient documentation

## 2023-06-11 DIAGNOSIS — I10 Essential (primary) hypertension: Secondary | ICD-10-CM | POA: Insufficient documentation

## 2023-06-11 DIAGNOSIS — E039 Hypothyroidism, unspecified: Secondary | ICD-10-CM | POA: Insufficient documentation

## 2023-06-11 NOTE — Assessment & Plan Note (Signed)
ERV 30% on PFTs  12/28/19 @ wt 255  Body mass index is 42.77 kg/m.  -  trending down slightly but a long way to go Lab Results  Component Value Date   TSH 4.046 04/25/2022      Contributing to doe and risk of GERD >>>   reviewed the need and the process to achieve and maintain neg calorie balance > defer f/u primary care including intermittently monitoring thyroid status      Each maintenance medication was reviewed in detail including emphasizing most importantly the difference between maintenance and prns and under what circumstances the prns are to be triggered using an action plan format where appropriate.  Total time for H and P, chart review, counseling,  directly observing portions of ambulatory 02 saturation study/ and generating customized AVS unique to this office visit / same day charting  > 40 min acute eval

## 2023-06-11 NOTE — Telephone Encounter (Signed)
Patient in office to have labs drawn.

## 2023-06-11 NOTE — Telephone Encounter (Signed)
LVM for pt to return call

## 2023-06-11 NOTE — Assessment & Plan Note (Signed)
D/c on 02 03/3022 p CAP and RAF 06/25/2022   Walked on 3 lpm POC  x  1  lap(s) =  approx 150  ft  @ slow pace, stopped due to sob  with lowest 02 sats 91% then dropped to 89% on lap 2 and required 4lpm POC to complete  - 06/11/2023   Walked on 3lpm POC   x  1  lap(s) =  approx 150  ft  @ moderate pace, stopped due to sob with lowest 02 sats 97%    No need to change 02 rx at this point

## 2023-06-11 NOTE — Telephone Encounter (Signed)
Cxr suggests maybe amiodarone vs chf so return to lab this afternoon and stop amiodarone until we call back with results (it doesn't get out of the system for weeks so ok to leave off for a few days)

## 2023-06-11 NOTE — Assessment & Plan Note (Signed)
Quit smoking 1998 at wt 140 Onset was 2019-20 on a background of wt gain up to 230 / orthopnea since 2016-17 - Covid Pos aug 2021  - PFT's 12/28/19 nl ex ERV 30% with DLCO 14.17 (71%) and corrected to 5.01 (122%) for alv vol  -  07/30/2020   Walked RA  approx   300 ft  @ moderate pace  stopped due to  Sob with sats 93%  - much worse since dx of CAP / Rapid afib > 02 dep at post hosp f/u 05/16/2022    - worse x 1st of Feb 2025 without cough or cp/ assoc mild leg swelling on amiodarone since 09/2022  Symptoms are markedly disproportionate to objective findings and not clear to what extent this is actually a pulmonary  problem but pt does appear to have difficult to sort out respiratory symptoms of unknown origin for which  DDX  = almost all start with A and  include Adherence, Ace Inhibitors, Acid Reflux, Active Sinus Disease, Alpha 1 Antitripsin deficiency, Anxiety masquerading as Airways dz,  ABPA,  Allergy(esp in young), Aspiration (esp in elderly), Adverse effects of meds,  Active smoking or Vaping, A bunch of PE's/clot burden (a few small clots can't cause this syndrome unless there is already severe underlying pulm or vascular dz with poor reserve),  Anemia or thyroid disorder, plus two Bs  = Bronchiectasis and Beta blocker use..and one C= CHF    BOLDED dx's of concern: No evidence of allergy/ asthma and she is already on DOAC so PE unlikely   Amiodarone of greatest immediate concern so rec check esr/BNP/hgb/tsh and hold Amiodarone until labs back given 48 day half life

## 2023-06-11 NOTE — Patient Instructions (Addendum)
Classic subdiaphragmatic pain pattern suggests ibs:  Stereotypical, migratory with a very limited distribution of pain locations, daytime, not usually exacerbated by exercise  or coughing, worse in sitting position, frequently associated with generalized abd bloating, not as likely to be present supine due to the dome effect of the diaphragm which  is  canceled in that position. Frequently these patients have had multiple negative GI workups and CT scans.  Treatment consists of avoiding foods that cause gas (especially boiled eggs, mexcican food but especially  beans and undercooked vegetables like  spinach and some salads)  and citrucel 1 heaping tbsp twice daily with a large glass of water.  Pain should improve w/in 2 weeks and if not then consider further GI work up.     Please remember to go to the  x-ray department  for your tests - we will call you with the results when they are available.  Make sure you check your oxygen saturation  AT  your highest level of activity (not after you stop)   to be sure it stays over 90% and adjust  02 flow upward to maintain this level if needed but remember to turn it back to previous settings when you stop (to conserve your supply).

## 2023-06-11 NOTE — Progress Notes (Signed)
Brooke Luna [place], female    DOB: 12-05-1945    MRN: [id number]   Brief patient profile:  77  yowf quit smoking 1998  @ 140 lb no resp problems until around 2019-2020 at around 230 lb new onset doe  and gradually gained up to 264 with proportionate doe  >>>  self referred  to pulmonary clinic 07/30/2020   Onset of afib around 2018   Covid Pos  Aug 2021    History of Present Illness  07/30/2020  Pulmonary/ 1st office eval/ [name] / [name]  Chief Complaint  Patient presents with   Pulmonary Consult    Referred by Dr [name]. Pt c/o DOE for the past few years, esp worse over the past year. She gets winded walking room to room at home, taking out the trash, showering.   Dyspnea:  [employer] pushes buggy, uses HC, one aisle is the limit = MMRC3 = can't walk 100 yards even at a slow pace at a flat grade s stopping due to sob   Cough: for about a year /   Sporadic/dry  Sleep: wakes up most nights in 45 degrees  recliner due to breathing or coughng  X 2017  SABA use: not helping  Rec To get the most out of exercise, you need to be continuously aware that you are short of breath, but never out of breath, for 30 minutes daily. PACE YOURSELF  Make sure you check your oxygen saturation at your highest level of activity to be sure it stays over 90% Pantoprazole (protonix) 40 mg   Take  30-60 min before first meal of the day and Pepcid (famotidine)  20 mg one @  bedtime until return to office  GERD diet reviewed, bed blocks rec  Please schedule a follow up office visit in 6 weeks, call sooner if needed > did not return as rec   Admit date: 04/25/2022 Discharge date: 04/26/2022 Recommendations for Outpatient Follow-Up:    Follow up with your primary care provider in one week.  Check CBC, BMP, magnesium in the next visit Follow-up with your cardiologist as outpatient to discuss about atrial fibrillation treatment,   Discharge Diagnosis:    Principal Problem:   Atrial  fibrillation with rapid ventricular response (HCC)   Persistent atrial fibrillation (HCC)   CHF (congestive heart failure) (HCC)   GERD (gastroesophageal reflux disease)     Discharge Condition: Improved.   Diet recommendation: Low sodium, heart healthy.     Wound care: None.   Code status: Full.     History of Present Illness:    Patient is 78 years old female with past medical history of persistent atrial fibrillation presented to hospital with palpitation chest tightness and fatigue with shortness of breath.  Patient had noted palpitations for the last 1 week or so.  Denied any dizziness or syncope, headache lightheadedness.  In the, ED, patient was noted to have atrial fibrillation with rapid ventricular response.  She was given 2 doses of 10 mg IV Cardizem push without much response so was started on Cardizem drip and considered for admission to the hospital.  Of note patient does have a history of cardioversion in the past and follows up with Dr. [name] as outpatient.  Patient was then admitted hospital for further evaluation and treatment.   Hospital Course:    Following conditions were addressed during hospitalization as listed below,   Atrial fibrillation with rapid ventricular response.  History of persistent atrial fibrillation.  Follows  up with cardiology to branch as outpatient.  Patient was symptomatic on presentation.   Patient received Cardizem bolus in the ED and was continued with Cardizem drip.  Cardiology was consulted due to history of cardioversion and was started on flecainide 75 mg twice daily along with Cardizem drip.  Patient did have 2D echocardiogram done 12/22 with LV ejection fraction of 60 to 65%.  Repeat 2D echocardiogram during this hospitalization showed LV ejection fraction of 60 to 65% with indeterminate diastolic parameters.   Patient is 120 mg long-acting and 30 mg as needed Cardizem at home.  Metoprolol dose was increased from 25 twice daily to 50 twice  daily at this time.  Patient will continue to follow-up with cardiology as outpatient with this new regimen plus flecainide.  Continue with Xarelto.  TSH on 04/25/2022 was 4.0.  I spoke with Dr. [name] cardiology on-call at [facility] today regarding the patient and the plan is to continue flecainide and increased dose of metoprolol on discharge with outpatient electrophysiology/cardiology follow-up.   History of congestive heart failure Remained compensated.  2D echocardiogram with preserved LV function.    06/25/2022  f/u ov/[place] office/[name] re: doe/ obesity changes on cxr  maint on 02 only    Chief Complaint  Patient presents with   Follow-up    Breathing is about the same since last ov CXR done today  Has questions about blood work    Dyspnea:  house bound due to low 02 3lpm improved since dec  2023 but not as good a year prior to OV   Cough: none  Sleeping: 45 degrees since 2017  SABA use: none  02: 3lpm cont daytime/ 3lpm cont at rest Covid status: 2 vax/ one infection  Rec Make sure you check your oxygen saturation  AT  your highest level of activity (not after you stop)   to be sure it stays over 90%       09/16/22 s/p cardioversion      09/29/2022  f/u ov/[place] office/[name] re: doe/ obesity  maint on 02 2lpm rest/sleep/ 4lpm with activity  but not really titrating  Chief Complaint  Patient presents with   Follow-up    Pt f/u states that her breathing is the same as LOV - she was recently in the ED for her afib and cardioversion performed   Dyspnea:  really limited by B knee pain /s/p R TKR  [place] and ever since > amox/ pred  Cough: no am flares Sleeping: 45 degrees in recliner x sev years  SABA use: none  02: 2lpm sleeping and sitting and 4lpm shower  Rec Make sure you check your oxygen saturation at your highest level of activity (NOT after you stop)  to be sure it stays over 90% and keep track of it at least once a week, more  often if breathing getting worse, and let me know if losing ground. (Collect the dots to connect the dots approach)   Think about recumbent bicycle x up to 30 min at a time - it should be much easier for you than bearing weight on your joints   Follow up here is as needed    06/11/2023  ACUTE ov/[place] office/Lemar Bakos re: hypoxemic resp failure  maint on amiodarone  ? Since 09/2022 Chief Complaint  Patient presents with   Acute Visit   Shortness of Breath  Dyspnea:   worse x 2 weeks / no regular activity (rides scooter when goes to  stores)   Cough: none  Sleeping: 45 degrees in recliner x sev years more difficult x 2 weeks but 02 sats are ok  SABA use: none  02: 3lpm at bedtime  L UQ pain x one year/ waxes and wanes not pleuritic     No obvious day to day or daytime variability or assoc excess/ purulent sputum or mucus plugs or hemoptysis or cp or chest tightness, subjective wheeze or overt sinus or hb symptoms.    Also denies any obvious fluctuation of symptoms with weather or environmental changes or other aggravating or alleviating factors except as outlined above   No unusual exposure hx or h/o childhood pna/ asthma or knowledge of premature birth.  Current Allergies, Complete Past Medical History, Past Surgical History, Family History, and Social History were reviewed in [employer] record.  ROS  The following are not active complaints unless bolded Hoarseness, sore throat, dysphagia, dental problems, itching, sneezing,  nasal congestion or discharge of excess mucus or purulent secretions, ear ache,   fever, chills, sweats, unintended wt loss or wt gain, classically pleuritic or exertional cp,  orthopnea pnd or arm/hand swelling  or leg swelling L > R, presyncope, palpitations, abdominal pain, anorexia, nausea, vomiting, diarrhea  or change in bowel habits or change in bladder habits, change in stools or change in urine, dysuria, hematuria,  rash, arthralgias,  visual complaints, headache, numbness, weakness or ataxia or problems with walking or coordination,  change in mood or  memory.        Current Meds  Medication Sig   acetaminophen (TYLENOL) 325 MG tablet Take 650 mg by mouth every 6 (six) hours as needed for moderate pain or headache.   amiodarone (PACERONE) 200 MG tablet Take 200 mg by mouth daily.   citalopram (CELEXA) 20 MG tablet Take 20 mg by mouth daily.   clonazePAM (KLONOPIN) 0.5 MG tablet Take 0.5 mg by mouth 2 (two) times daily as needed for anxiety.   cyanocobalamin (VITAMIN B12) 1000 MCG tablet Take 1,000 mcg by mouth daily.   diltiazem (CARDIZEM CD) 120 MG 24 hr capsule TAKE 1 CAPSULE BY MOUTH EVERY DAY   diltiazem (CARDIZEM) 30 MG tablet Take 1 tablet (30 mg total) by mouth daily as needed (afib).   furosemide (LASIX) 40 MG tablet Take 1 tablet (40 mg total) by mouth daily. May take an additional 20mg  daily as needed for weight >263 lbs.   hydrocortisone cream 0.5 % Apply 1 Application topically 2 (two) times daily as needed for itching.   ibuprofen (ADVIL) 200 MG tablet Take 200 mg by mouth every 6 (six) hours as needed for fever, headache or mild pain.   metoprolol tartrate (LOPRESSOR) 25 MG tablet Take 25 mg by mouth every morning.   XARELTO 20 MG TABS tablet TAKE ONE TABLET BY MOUTH EVERY DAY           Past Medical History:  Diagnosis Date   Arthritis    pain and oa  right knee and pain in rt leg and foot--also pain left knee-but right is worse   Atrial fibrillation (HCC)    Back pain    lumbar bulging disk   Contact dermatitis    underside of left leg--always in the same spots--comes and goes   Diverticulosis    GERD (gastroesophageal reflux disease)    diet control   Hemorrhoids    Obesity    Persistent atrial fibrillation (HCC)       Objective:   Wts  06/11/2023       257   09/29/2022        262  06/25/2022       268  05/16/22 268 lb (121.6 kg)  05/01/22 271 lb (122.9 kg)  04/30/22 266 lb (120.7 kg)     Vital signs reviewed  06/11/2023  - Note at rest 02 sats  94% on 3lpm    General appearance:    obese amb wf nad    HEENT : Oropharynx  clear         NECK :  without  apparent JVD/ palpable Nodes/TM    LUNGS: no acc muscle use,  Nl contour chest which is clear to A and P bilaterally without cough on insp or exp maneuvers   CV:  RRR  no s3 or murmur or increase in P2, and  L>R pitting edema  ABD: obese  soft and nontender   MS:  Gait slow slt awkward/   ext warm without deformities Or obvious joint restrictions  calf tenderness, cyanosis or clubbing    SKIN: warm and dry without lesions    NEURO:  alert, approp, nl sensorium with  no motor or cerebellar deficits apparent.     CXR PA and Lateral:   06/11/2023 :    I personally reviewed images and impression is as follows:     Cm with increased markings       I personally reviewed images and agree with radiology impression as follows:   Chest CTa   04/30/22  No evidence of pulmonary embolism.  Scattered patchy airspace disease bilaterally, most prominent in the upper lobes right middle lobe, consistent with a multifocal infectious/inflammatory process. There are distinct nodular opacities in the left upper lobe and left lower lobe, measuring up to 7 mm and 10 mm respectively, which are indeterminate. Prominent bilateral hilar lymph nodes, favored to be reactive. Recommend follow-up chest CT in 3 months > improved on f/u study 08/08/22               Assessment

## 2023-06-11 NOTE — Telephone Encounter (Signed)
Patient seen in office today.

## 2023-06-12 ENCOUNTER — Telehealth: Payer: Self-pay | Admitting: Cardiology

## 2023-06-12 ENCOUNTER — Ambulatory Visit: Payer: Medicare Other | Admitting: Cardiovascular Disease

## 2023-06-12 LAB — CBC WITH DIFFERENTIAL/PLATELET
Basophils Absolute: 0.1 10*3/uL (ref 0.0–0.2)
Basos: 1 %
EOS (ABSOLUTE): 0.4 10*3/uL (ref 0.0–0.4)
Eos: 4 %
Hematocrit: 38.7 % (ref 34.0–46.6)
Hemoglobin: 12.5 g/dL (ref 11.1–15.9)
Immature Grans (Abs): 0 10*3/uL (ref 0.0–0.1)
Immature Granulocytes: 0 %
Lymphocytes Absolute: 1 10*3/uL (ref 0.7–3.1)
Lymphs: 9 %
MCH: 27.1 pg (ref 26.6–33.0)
MCHC: 32.3 g/dL (ref 31.5–35.7)
MCV: 84 fL (ref 79–97)
Monocytes Absolute: 0.6 10*3/uL (ref 0.1–0.9)
Monocytes: 5 %
Neutrophils Absolute: 9.4 10*3/uL — ABNORMAL HIGH (ref 1.4–7.0)
Neutrophils: 81 %
Platelets: 417 10*3/uL (ref 150–450)
RBC: 4.61 x10E6/uL (ref 3.77–5.28)
RDW: 13.2 % (ref 11.7–15.4)
WBC: 11.6 10*3/uL — ABNORMAL HIGH (ref 3.4–10.8)

## 2023-06-12 LAB — BASIC METABOLIC PANEL
BUN/Creatinine Ratio: 12 (ref 12–28)
BUN: 12 mg/dL (ref 8–27)
CO2: 28 mmol/L (ref 20–29)
Calcium: 9 mg/dL (ref 8.7–10.3)
Chloride: 98 mmol/L (ref 96–106)
Creatinine, Ser: 1.04 mg/dL — ABNORMAL HIGH (ref 0.57–1.00)
Glucose: 125 mg/dL — ABNORMAL HIGH (ref 70–99)
Potassium: 4.5 mmol/L (ref 3.5–5.2)
Sodium: 142 mmol/L (ref 134–144)
eGFR: 55 mL/min/{1.73_m2} — ABNORMAL LOW (ref >59)

## 2023-06-12 LAB — TSH: TSH: 6.87 u[IU]/mL — ABNORMAL HIGH (ref 0.450–4.500)

## 2023-06-12 LAB — SEDIMENTATION RATE: Sed Rate: 14 mm/h (ref 0–40)

## 2023-06-12 LAB — BRAIN NATRIURETIC PEPTIDE: BNP: 72.8 pg/mL (ref 0.0–100.0)

## 2023-06-12 NOTE — Telephone Encounter (Signed)
Per call center message [facility] Uvaldo Rising) - nevermind she said pulmonology is taking care of it.  See Dr. [name] current notes.

## 2023-06-12 NOTE — Telephone Encounter (Signed)
Pt called back in. She states she is not SOB right now but would like to speak with nurse about situation since Dr. [name] prescribed this med. Informed her message was sent today and they will c/b shortly.

## 2023-06-12 NOTE — Telephone Encounter (Addendum)
Pt c/o medication issue:  1. Name of Medication: amiodarone (PACERONE) 200 MG tablet   2. How are you currently taking this medication (dosage and times per day)? 200 mg, Daily but did not take today as directed by pulmo  3. Are you having a reaction (difficulty breathing--STAT)? SOB, especially when up moving around  4. What is your medication issue? Pt states she was seen by her pulmonologist and he thinks that this medication is causing pt some breathing issues, requesting cb

## 2023-06-16 ENCOUNTER — Telehealth: Payer: Self-pay | Admitting: Internal Medicine

## 2023-06-16 NOTE — Telephone Encounter (Signed)
 Patient states she had testing done last week and would like the results---call back [phone]

## 2023-06-16 NOTE — Telephone Encounter (Signed)
 Spoke with patient regarding results and recommendations. Patient voiced understanding. No further questions or concerns.   Results routed to PCP and Card.

## 2023-06-23 ENCOUNTER — Encounter: Payer: Self-pay | Admitting: *Deleted

## 2023-07-06 ENCOUNTER — Encounter: Payer: Self-pay | Admitting: Cardiology

## 2023-09-03 ENCOUNTER — Ambulatory Visit: Attending: Cardiology | Admitting: Cardiology

## 2023-09-03 ENCOUNTER — Encounter: Payer: Self-pay | Admitting: Cardiology

## 2023-09-03 VITALS — BP 136/64 | HR 58 | Ht 64.5 in | Wt 256.0 lb

## 2023-09-03 DIAGNOSIS — D6869 Other thrombophilia: Secondary | ICD-10-CM | POA: Diagnosis not present

## 2023-09-03 DIAGNOSIS — R0602 Shortness of breath: Secondary | ICD-10-CM

## 2023-09-03 DIAGNOSIS — I5033 Acute on chronic diastolic (congestive) heart failure: Secondary | ICD-10-CM | POA: Diagnosis not present

## 2023-09-03 DIAGNOSIS — I10 Essential (primary) hypertension: Secondary | ICD-10-CM | POA: Diagnosis not present

## 2023-09-03 DIAGNOSIS — I480 Paroxysmal atrial fibrillation: Secondary | ICD-10-CM

## 2023-09-03 DIAGNOSIS — Z79899 Other long term (current) drug therapy: Secondary | ICD-10-CM

## 2023-09-03 MED ORDER — FUROSEMIDE 40 MG PO TABS: 60.0000 mg | ORAL_TABLET | Freq: Every day | ORAL | 1 refills | Status: DC

## 2023-09-03 NOTE — Progress Notes (Signed)
 Clinical Summary Brooke Luna is a 78 y.o.female seen today for follow up of the following medical problems.    1. PAF/Aflutter - diagnosed 12/2014 during hospital admission at [facility] - followed by Dr Nunzio Belch, she is s/p afib ablation 05/2019   - 11/2019 had admission with atrial flutter. Incidetnal COVID + at the time. She was rate controlled, at follow ups had converted back to SR - ER visit 07/17/20 with palpitations,EKG with aflutter with variable conduction.  - rates were able to be controlled in ER, discharged   - admit to [facility] 01/2021 initially for unintentional overdose of her calcium channel blocker - noted to be in afib with RVR at the time. Found to be RSV+, had a cough at the time.      - admit 06/2021 with aflutter with RVR, cardioverted in ER     -transiently on flecanide started 03/2022 admission with PAF - later discontinued by EP due tp CAD. Recs were to start dofetilide but patietn wanted to monitor symptoms before starting.    - has since followed with EP, started amiodarone .  - started amio - no recent palpitatons.      -pulmonary evaluated, felt SOB may be amio related - breathing gradually improving.  - no recent palpitations. Checks home heart rates, 50s - no bleeding on xarelto . CrCl 82 today, she is on 20mg  daily.      2. HTN - she is compliant with meds     3. 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      - 05/2019 CT cardiac prior to ablation, nonobstructive CAD - no exertional symptoms.      4. Chronic hypoxic resp failure - chronically on 3L at home, at her baseline breathing - followed by pulmonary          5. Chronic HFpEF  -chronic left leg swelling - chronic orthopena, sleeps in recliner. - ongoing SOB/DOE   SH: enjoys travelling to casinos, usually plays the penny slots. Travellling to [place] this  summer, often travels to [place].      Past Medical History:  Diagnosis Date   Arthritis    pain and oa  right knee and pain in rt leg and foot--also pain left knee-but right is worse   Atrial fibrillation (HCC)    Back pain    lumbar bulging disk   CHF (congestive heart failure) (HCC)    Contact dermatitis    underside of left leg--always in the same spots--comes and goes   Diverticulosis    GERD (gastroesophageal reflux disease)    diet control   Hemorrhoids    Obesity    Persistent atrial fibrillation (HCC)      Allergies  Allergen Reactions   Codeine Itching and Shortness Of Breath   Meloxicam Other (See Comments)    Stomach iritation      Current Outpatient Medications  Medication Sig Dispense Refill   acetaminophen  (TYLENOL ) 325 MG tablet Take 650 mg by mouth every 6 (six) hours as needed for moderate pain or headache.     amiodarone  (PACERONE ) 200 MG tablet Take 200 mg by mouth daily.     citalopram  (CELEXA ) 20 MG tablet Take 20 mg by mouth daily.     clonazePAM  (KLONOPIN ) 0.5 MG tablet Take 0.5 mg by mouth 2 (two) times daily as needed for anxiety.  cyanocobalamin (VITAMIN B12) 1000 MCG tablet Take 1,000 mcg by mouth daily.     diltiazem  (CARDIZEM  CD) 120 MG 24 hr capsule TAKE 1 CAPSULE BY MOUTH EVERY DAY 90 capsule 0   diltiazem  (CARDIZEM ) 30 MG tablet Take 1 tablet (30 mg total) by mouth daily as needed (afib). 30 tablet 6   furosemide  (LASIX ) 40 MG tablet Take 1 tablet (40 mg total) by mouth daily. May take an additional 20mg  daily as needed for weight >263 lbs. 135 tablet 3   hydrocortisone  cream 0.5 % Apply 1 Application topically 2 (two) times daily as needed for itching. 30 g 0   ibuprofen (ADVIL) 200 MG tablet Take 200 mg by mouth every 6 (six) hours as needed for fever, headache or mild pain.     metoprolol  tartrate (LOPRESSOR ) 25 MG tablet Take 25 mg by mouth every morning.     XARELTO  20 MG TABS tablet TAKE ONE TABLET BY MOUTH EVERY DAY 90 tablet 1    No current facility-administered medications for this visit.     Past Surgical History:  Procedure Laterality Date   ATRIAL FIBRILLATION ABLATION N/A 06/07/2019   Procedure: ATRIAL FIBRILLATION ABLATION;  Surgeon: Jolly Needle, MD;  Location: [facility] INVASIVE CV LAB;  Service: Cardiovascular;  Laterality: N/A;   CARDIOVERSION N/A 05/09/2019   Procedure: CARDIOVERSION;  Surgeon: Sheryle Donning, MD;  Location: [facility] ENDOSCOPY;  Service: Cardiovascular;  Laterality: N/A;   CATARACT EXTRACTION W/PHACO Left 02/22/2015   Procedure: CATARACT EXTRACTION PHACO AND INTRAOCULAR LENS PLACEMENT LEFT EYE CDE=5.41;  Surgeon: Anner Kill, MD;  Location: [facility] ORS;  Service: Ophthalmology;  Laterality: Left;   CATARACT EXTRACTION W/PHACO Right 03/26/2015   Procedure: CATARACT EXTRACTION PHACO AND INTRAOCULAR LENS PLACEMENT (IOC);  Surgeon: Anner Kill, MD;  Location: [facility] ORS;  Service: Ophthalmology;  Laterality: Right;  CDE:5.01   CHOLECYSTECTOMY     surgery for broken right elbow Right    TOTAL KNEE ARTHROPLASTY  11/03/2011   Procedure: TOTAL KNEE ARTHROPLASTY;  Surgeon: Aurther Blue, MD;  Location: [facility] ORS;  Service: Orthopedics;  Laterality: Right;   TUBAL LIGATION       Allergies  Allergen Reactions   Codeine Itching and Shortness Of Breath   Meloxicam Other (See Comments)    Stomach iritation       Family History  Problem Relation Age of Onset   Diabetes Mother    Stroke Mother    Stroke Father      Social History Ms. Stokke reports that she quit smoking about 27 years ago. Her smoking use included cigarettes. She started smoking about 66 years ago. She has a 38.8 pack-year smoking history. She has never used smokeless tobacco. Ms. Mays reports current alcohol  use of about 4.0 standard drinks of alcohol  per week.    Physical Examination Today's Vitals   09/03/23 1424  BP: 136/64  Pulse: (!) 58  SpO2: 96%  Weight: 256 lb (116.1 kg)  Height: 5' 4.5" (1.638 m)   Body mass  index is 43.26 kg/m.  Gen: resting comfortably, no acute distress HEENT: no scleral icterus, pupils equal round and reactive, no palptable cervical adenopathy,  CV: RRR, n m/rg, no jvd Resp: mild crackles bilaterally GI: abdomen is soft, non-tender, non-distended, normal bowel sounds, no hepatosplenomegaly MSK: extremities are warm, 1+ bilateral LE edema Skin: warm, no rash Neuro:  no focal deficits Psych: appropriate affect   Diagnostic Studies  12/2016 sleep study IMPRESSIONS - Minimal obstructive sleep apnea occurred during this study (AHI = 5.5/h)  occurring primarily during supine sleep. - Mild central sleep apnea occurred during this study (CAI = 5.5/h). - The patient had minimal or no oxygen desaturation during the study (Min O2 = 100.00%) - The patient snored with Loud snoring volume. - EKG findings include Atrial Fibrillation. - Mild periodic limb movements of sleep occurred during the study. Associated arousals were significant.   DIAGNOSIS - Obstructive Sleep Apnea (327.23 [G47.33 ICD-10])   RECOMMENDATIONS - Very mild obstructive sleep apnea occurring primarily in supine position. - Recommend avoiding sleeping supine. - Avoid alcohol , sedatives and other CNS depressants that may worsen sleep apnea and disrupt normal sleep architecture. - Sleep hygiene should be reviewed to assess factors that may improve sleep quality. - Weight management and regular exercise should be initiated or continued if appropriate. - Consider referral to ENT for evaluation of possible surgical causes of snoring.    11/2016 echo Study Conclusions   - Left ventricle: The cavity size was normal. Wall thickness was   normal. Systolic function was normal. The estimated ejection   fraction was in the range of 60% to 65%. Left ventricular   diastolic function parameters were normal. - Left atrium: The atrium was mildly to moderately dilated.   03/2021 echo 1. Left ventricular ejection  fraction, by estimation, is 60 to 65%. The  left ventricle has normal function. The left ventricle has no regional  wall motion abnormalities. Left ventricular diastolic function could not  be evaluated.   2. Right ventricular systolic function is normal. The right ventricular  size is normal. There is normal pulmonary artery systolic pressure. The  estimated right ventricular systolic pressure is 18.1 mmHg.   3. The trivial pericardial effusion is anterior to the right ventricle.   4. The mitral valve is abnormal. Trivial mitral valve regurgitation.   5. The aortic valve is tricuspid. Aortic valve regurgitation is not  visualized.   6. The inferior vena cava is normal in size with greater than 50%  respiratory variability, suggesting right atrial pressure of 3 mmHg.      03/2022 echo 1. EF is challenging in afib. Left ventricular ejection fraction, by  estimation, is 60 to 65%. The left ventricle has normal function. The left  ventricle has no regional wall motion abnormalities. Left ventricular  diastolic parameters are indeterminate.   2. Right ventricular systolic function is normal. The right ventricular  size is normal. Tricuspid regurgitation signal is inadequate for assessing  PA pressure.   3. The mitral valve was not well visualized. No evidence of mitral valve  regurgitation.   4. Aortic valve regurgitation is not visualized.   5. The inferior vena cava is normal in size with greater than 50%  respiratory variability, suggesting right atrial pressure of 3 mmHg.      Assessment and Plan   1. PAF/Aflutter/acquired thrombophilia - prior ablation for afib - has had afib recurrences - most recently on afib and did well however pulmonary recently stopped for progressing SOB.  - EKG today shows sinus brady at 57 - if recurrent issues with afib will need to see EP back, reconsider dofetilide most likely - continue xarelto  for stroke prevention, CrCl is 82, she is on 20mg   daily.      2. HTN -bp is at goal, continue current meds   3. Acute on chronic HFpEF -signs of fluid overload. DOE somewhat improved off amio but not resolved, likely some fluid playing a role - increase her lasix  to 60mg  daily, may take additional  20mg  prn - 2 weeks check bmet/mg/bnp  F/u 4 weeks reassess volume status    Laurann Pollock, M.D.

## 2023-09-03 NOTE — Patient Instructions (Signed)
 Medication Instructions:   Increase Lasix  to 60mg  daily, may take an additional 20mg  as needed for swelling  Continue all other medications.     Labwork:  BMET, Mg, BNP - orders given today Please do in 2 weeks   Testing/Procedures:  Your physician has requested that you have an echocardiogram. Echocardiography is a painless test that uses sound waves to create images of your heart. It provides your doctor with information about the size and shape of your heart and how well your heart's chambers and valves are working. This procedure takes approximately one hour. There are no restrictions for this procedure. Please do NOT wear cologne, perfume, aftershave, or lotions (deodorant is allowed). Please arrive 15 minutes prior to your appointment time.  Please note: We ask at that you not bring children with you during ultrasound (echo/ vascular) testing. Due to room size and safety concerns, children are not allowed in the ultrasound rooms during exams. Our front office staff cannot provide observation of children in our lobby area while testing is being conducted. An adult accompanying a patient to their appointment will only be allowed in the ultrasound room at the discretion of the ultrasound technician under special circumstances. We apologize for any inconvenience.  Follow-Up:  4 weeks   Any Other Special Instructions Will Be Listed Below (If Applicable).   If you need a refill on your cardiac medications before your next appointment, please call your pharmacy.

## 2023-09-04 ENCOUNTER — Other Ambulatory Visit: Payer: Self-pay | Admitting: *Deleted

## 2023-09-04 MED ORDER — FUROSEMIDE 20 MG PO TABS: 60.0000 mg | ORAL_TABLET | Freq: Every day | ORAL | 1 refills | Status: DC

## 2023-09-06 LAB — BRAIN NATRIURETIC PEPTIDE: BNP: 92.8 pg/mL (ref 0.0–100.0)

## 2023-09-06 LAB — MAGNESIUM: Magnesium: 2.3 mg/dL (ref 1.6–2.3)

## 2023-09-06 LAB — BASIC METABOLIC PANEL WITH GFR
BUN/Creatinine Ratio: 18 (ref 12–28)
BUN: 16 mg/dL (ref 8–27)
CO2: 27 mmol/L (ref 20–29)
Calcium: 9.4 mg/dL (ref 8.7–10.3)
Chloride: 98 mmol/L (ref 96–106)
Creatinine, Ser: 0.88 mg/dL (ref 0.57–1.00)
Glucose: 84 mg/dL (ref 70–99)
Potassium: 4.6 mmol/L (ref 3.5–5.2)
Sodium: 141 mmol/L (ref 134–144)
eGFR: 67 mL/min/{1.73_m2}

## 2023-09-08 ENCOUNTER — Ambulatory Visit: Payer: Self-pay

## 2023-09-23 ENCOUNTER — Ambulatory Visit: Attending: Cardiology

## 2023-09-23 ENCOUNTER — Other Ambulatory Visit: Payer: Self-pay | Admitting: Cardiology

## 2023-09-23 DIAGNOSIS — R0602 Shortness of breath: Secondary | ICD-10-CM | POA: Diagnosis not present

## 2023-09-24 LAB — ECHOCARDIOGRAM COMPLETE
AR max vel: 2.37 cm2
AV Area VTI: 2.24 cm2
AV Area mean vel: 2.18 cm2
AV Mean grad: 4 mmHg
AV Peak grad: 7.3 mmHg
Ao pk vel: 1.35 m/s
Calc EF: 62.1 %
MV VTI: 1.6 cm2
S' Lateral: 3.7 cm
Single Plane A2C EF: 63.2 %
Single Plane A4C EF: 57.1 %

## 2023-10-16 ENCOUNTER — Ambulatory Visit: Attending: Nurse Practitioner | Admitting: Nurse Practitioner

## 2023-10-16 ENCOUNTER — Encounter: Payer: Self-pay | Admitting: Nurse Practitioner

## 2023-10-16 VITALS — BP 128/70 | HR 60 | Ht 65.0 in | Wt 254.0 lb

## 2023-10-16 DIAGNOSIS — I480 Paroxysmal atrial fibrillation: Secondary | ICD-10-CM

## 2023-10-16 DIAGNOSIS — I10 Essential (primary) hypertension: Secondary | ICD-10-CM

## 2023-10-16 DIAGNOSIS — I2510 Atherosclerotic heart disease of native coronary artery without angina pectoris: Secondary | ICD-10-CM | POA: Diagnosis not present

## 2023-10-16 DIAGNOSIS — J449 Chronic obstructive pulmonary disease, unspecified: Secondary | ICD-10-CM

## 2023-10-16 DIAGNOSIS — J441 Chronic obstructive pulmonary disease with (acute) exacerbation: Secondary | ICD-10-CM

## 2023-10-16 DIAGNOSIS — R0609 Other forms of dyspnea: Secondary | ICD-10-CM

## 2023-10-16 NOTE — Patient Instructions (Addendum)

## 2023-10-16 NOTE — Progress Notes (Unsigned)
 Cardiology Office Note:    Date:  10/16/2023 ID:  Brooke [facility], DOB [birth year], MRN [id number] PCP:  Brooke Leta NOVAK, MD [facility] Providers Cardiologist:  Alvan Carrier, MD Electrophysiologist:  Eulas FORBES Furbish, MD     Referring MD: Brooke Leta NOVAK, MD   CC: Here for 4 week follow-up  History of Present Illness:    Brooke Luna is a 78 y.o. female with a PMH of nonobstructive CAD, persistent A-fib/A-flutter, s/p ablation in 2021, COPD on chronic O2, chronic venous insufficiency, GERD, anxiety, and morbid obesity, who presents today for 4-week follow-up.  Diagnosed with nonobstructive CAD by cardiac cath in 2006.  Patient was diagnosed with A-fib/a flutter in 2016 during hospital admission at [facility].  She has been previously followed by Dr. Kelsie and underwent ablation in 2021.  Most recently was admitted in March 2023 with a flutter with RVR, converted in the ED, s/p DCCV at that time.  Last seen by Dr. Carrier Alvan on Sep 03, 2023, showed signs of volume overload. Lasix  increased to 60 mg BID.   She is here for follow-up with her husband. Weights are stable. Has not noticed difference with medication change since last office visit. Denies any chest pain, palpitations, syncope, presyncope, dizziness, orthopnea, PND, swelling or significant weight changes, acute bleeding, or claudication. Breathing is stable per her report.    Please see the history of present illness.    All other systems reviewed and are negative.  EKGs/Labs/Other Studies Reviewed:    The following studies were reviewed today:  EKG:  EKG is not ordered today.   Echo 08/2023:  1. Left ventricular ejection fraction, by estimation, is 60 to 65%. The  left ventricle has normal function. The left ventricle has no regional  wall motion abnormalities. Left ventricular diastolic parameters are  indeterminate.   2. Right ventricular systolic function is normal. The right ventricular   size is normal. Tricuspid regurgitation signal is inadequate for assessing  PA pressure.   3. The mitral valve is normal in structure. No evidence of mitral valve  regurgitation. No evidence of mitral stenosis.   4. The aortic valve is tricuspid. Aortic valve regurgitation is not  visualized. No aortic stenosis is present.   5. The inferior vena cava is normal in size with greater than 50%  respiratory variability, suggesting right atrial pressure of 3 mmHg.   Comparison(s): A prior study was performed on 04/26/2022. EF 60-65%.  CT angio of chest on April 30, 2022: IMPRESSION: No evidence of pulmonary embolism.   Scattered patchy airspace disease bilaterally, most prominent in the upper lobes right middle lobe, consistent with a multifocal infectious/inflammatory process. There are distinct nodular opacities in the left upper lobe and left lower lobe, measuring up to 7 mm and 10 mm respectively, which are indeterminate. Prominent bilateral hilar lymph nodes, favored to be reactive. Recommend follow-up chest CT in 3 months.  Echocardiogram on April 26, 2022:   1. EF is challenging in afib. Left ventricular ejection fraction, by  estimation, is 60 to 65%. The left ventricle has normal function. The left  ventricle has no regional wall motion abnormalities. Left ventricular  diastolic parameters are indeterminate.   2. Right ventricular systolic function is normal. The right ventricular  size is normal. Tricuspid regurgitation signal is inadequate for assessing  PA pressure.   3. The mitral valve was not well visualized. No evidence of mitral valve  regurgitation.   4. Aortic valve regurgitation is not  visualized.   5. The inferior vena cava is normal in size with greater than 50%  respiratory variability, suggesting right atrial pressure of 3 mmHg.   Comparison(s): No significant change from prior study.  A-fib ablation on June 07, 2019: CONCLUSIONS: 1. Sinus rhythm  upon presentation.   2. Intracardiac echo reveals a moderate sized left atrium with four separate pulmonary veins without evidence of pulmonary vein stenosis. 3. Successful electrical isolation and anatomical encircling of all four pulmonary veins with radiofrequency current. 4. No inducible arrhythmias following ablation both on and off of Isuprel  5. No early apparent complications.  Cardiac CT/CTA on June 02, 2019:  IMPRESSION: 1. There is normal pulmonary vein drainage into the left atrium. Measurements as reported   2. There is no thrombus in the left atrial appendage.   3. The esophagus courses posterior to ostium of right inferior pulmonary vein   4. Calcium score 14 (41st percentile for age/gender). The study was performed without use of NTG so is not optimal for plaque evaluation, but nonobstructive CAD is seen with calcified plaque in the proximal LAD and proximal LCX causing minimal (0-24%) stenosis  1.  No acute findings in the imaged extracardiac chest. 2.  Aortic Atherosclerosis (ICD10-I70.0). 3. Pulmonary artery enlargement suggests pulmonary arterial hypertension. 4. 2 mm right-sided pulmonary nodule. No follow-up needed if patient is low-risk. Non-contrast chest CT can be considered in 12 months if patient is high-risk. This recommendation follows the consensus statement: Guidelines for Management of Incidental Pulmonary Nodules Detected on CT Images: From the Fleischner Society 2017; Radiology 2017; 284:228-243.  Physical Exam:    VS:  BP 128/70   Pulse 60   Ht 5' 5 (1.651 m)   Wt 254 lb (115.2 kg)   SpO2 96%   BMI 42.27 kg/m     Wt Readings from Last 3 Encounters:  10/16/23 254 lb (115.2 kg)  09/03/23 256 lb (116.1 kg)  06/11/23 257 lb (116.6 kg)    GEN: Morbidly obese, 78 y.o. female in no acute distress, on chronic O2 HEENT: Normal NECK: No JVD; No carotid bruits CARDIAC: S1/S2, RRR, no murmurs, rubs, gallops; 2+ radial pulses, equal  bilaterally; 1+ PT, equal bilaterally. RESPIRATORY:  Clear and diminished to auscultation without adventitious breath sounds MUSCULOSKELETAL:  No edema noted to BLE; No deformity  SKIN: Warm and dry NEUROLOGIC:  Alert and oriented x 3 PSYCHIATRIC:  Normal affect   ASSESSMENT & PLAN:    In order of problems listed above:  PAF Denies any tachycardia or palpitations. Continue current medication regimen.  Heart healthy diet and regular cardiovascular exercise as tolerated encouraged. Continue Xarelto  20 mg daily, denies any bleeding issues while on Xarelto . Continue to f/u with EP.   CAD Cath in 2006 revealed nonobstructive CAD and CCTA in 2021 showed minimal plaque with coronary calcium score of 14. Dr. Delford who saw her during hospitalization in 03/2022 stated she had no significant CAD. Stable with no anginal symptoms. No indication for ischemic evaluation. Continue current medication regimen. Heart healthy diet and regular cardiovascular exercise as tolerated encouraged.   HTN BP stable today. BP well controlled at home. Continue current medication regimen. Discussed to monitor BP at home at least 2 hours after medications and sitting for 5-10 minutes. Heart healthy diet and regular cardiovascular exercise encouraged.   4. COPD, DOE I believe pt has had recent progression in COPD symptoms. Weights are stable. Recent BNP normal. No real improvement since medication change. Echo unremarkable. Hx of pulmonary nodules.  Requesting a new pulmonologist, will provide referral. No medication changes at this time. Care and ED precautions discussed. Continue to follow with PCP.  5. Morbid obesity Weight loss via diet and exercise as tolerated encouraged. Discussed the impact being overweight would have on cardiovascular risk.  6. Disposition: Follow up with me/APP in 43-months or sooner if anything changes.   Medication Adjustments/Labs and Tests Ordered: Current medicines are reviewed at length  with the patient today.  Concerns regarding medicines are outlined above.  Orders Placed This Encounter  Procedures   Ambulatory referral to Pulmonology   No orders of the defined types were placed in this encounter.   Patient Instructions  Medication Instructions:  Your physician recommends that you continue on your current medications as directed. Please refer to the Current Medication list given to you today.  Labwork: None   Testing/Procedures: None   Follow-Up: Your physician recommends that you schedule a follow-up appointment in: 3 Months   Any Other Special Instructions Will Be Listed Below (If Applicable).  If you need a refill on your cardiac medications before your next appointment, please call your pharmacy.   Signed, Almarie Crate, NP

## 2023-10-20 ENCOUNTER — Telehealth: Payer: Self-pay | Admitting: Internal Medicine

## 2023-10-20 NOTE — Telephone Encounter (Signed)
 Patient wished to transfer care from Dr. Darlean to Dr. Alva

## 2023-11-06 ENCOUNTER — Other Ambulatory Visit: Payer: Self-pay | Admitting: Cardiology

## 2023-11-06 NOTE — Telephone Encounter (Signed)
 Pt last saw Almarie Crate, NP on 10/16/23, last labs 09/04/23 Creat 0.88, age 78, weight 115.2kg, CrCl 95.82, based on CrCl pt is on appropriate dosage of Xarelto  20mg  every day for afib.  Will refill rx.

## 2023-12-01 ENCOUNTER — Other Ambulatory Visit: Payer: Self-pay | Admitting: Cardiology

## 2023-12-06 IMAGING — DX DG CHEST 2V
2 series · 2 of 2 positions shown · non-contrast
Comparison: 07/17/2020

CLINICAL DATA: Dyspnea, shortness of breath, hypoxemia, history
atrial fibrillation, asthma

EXAM:
CHEST - 2 VIEW

[chest pa]
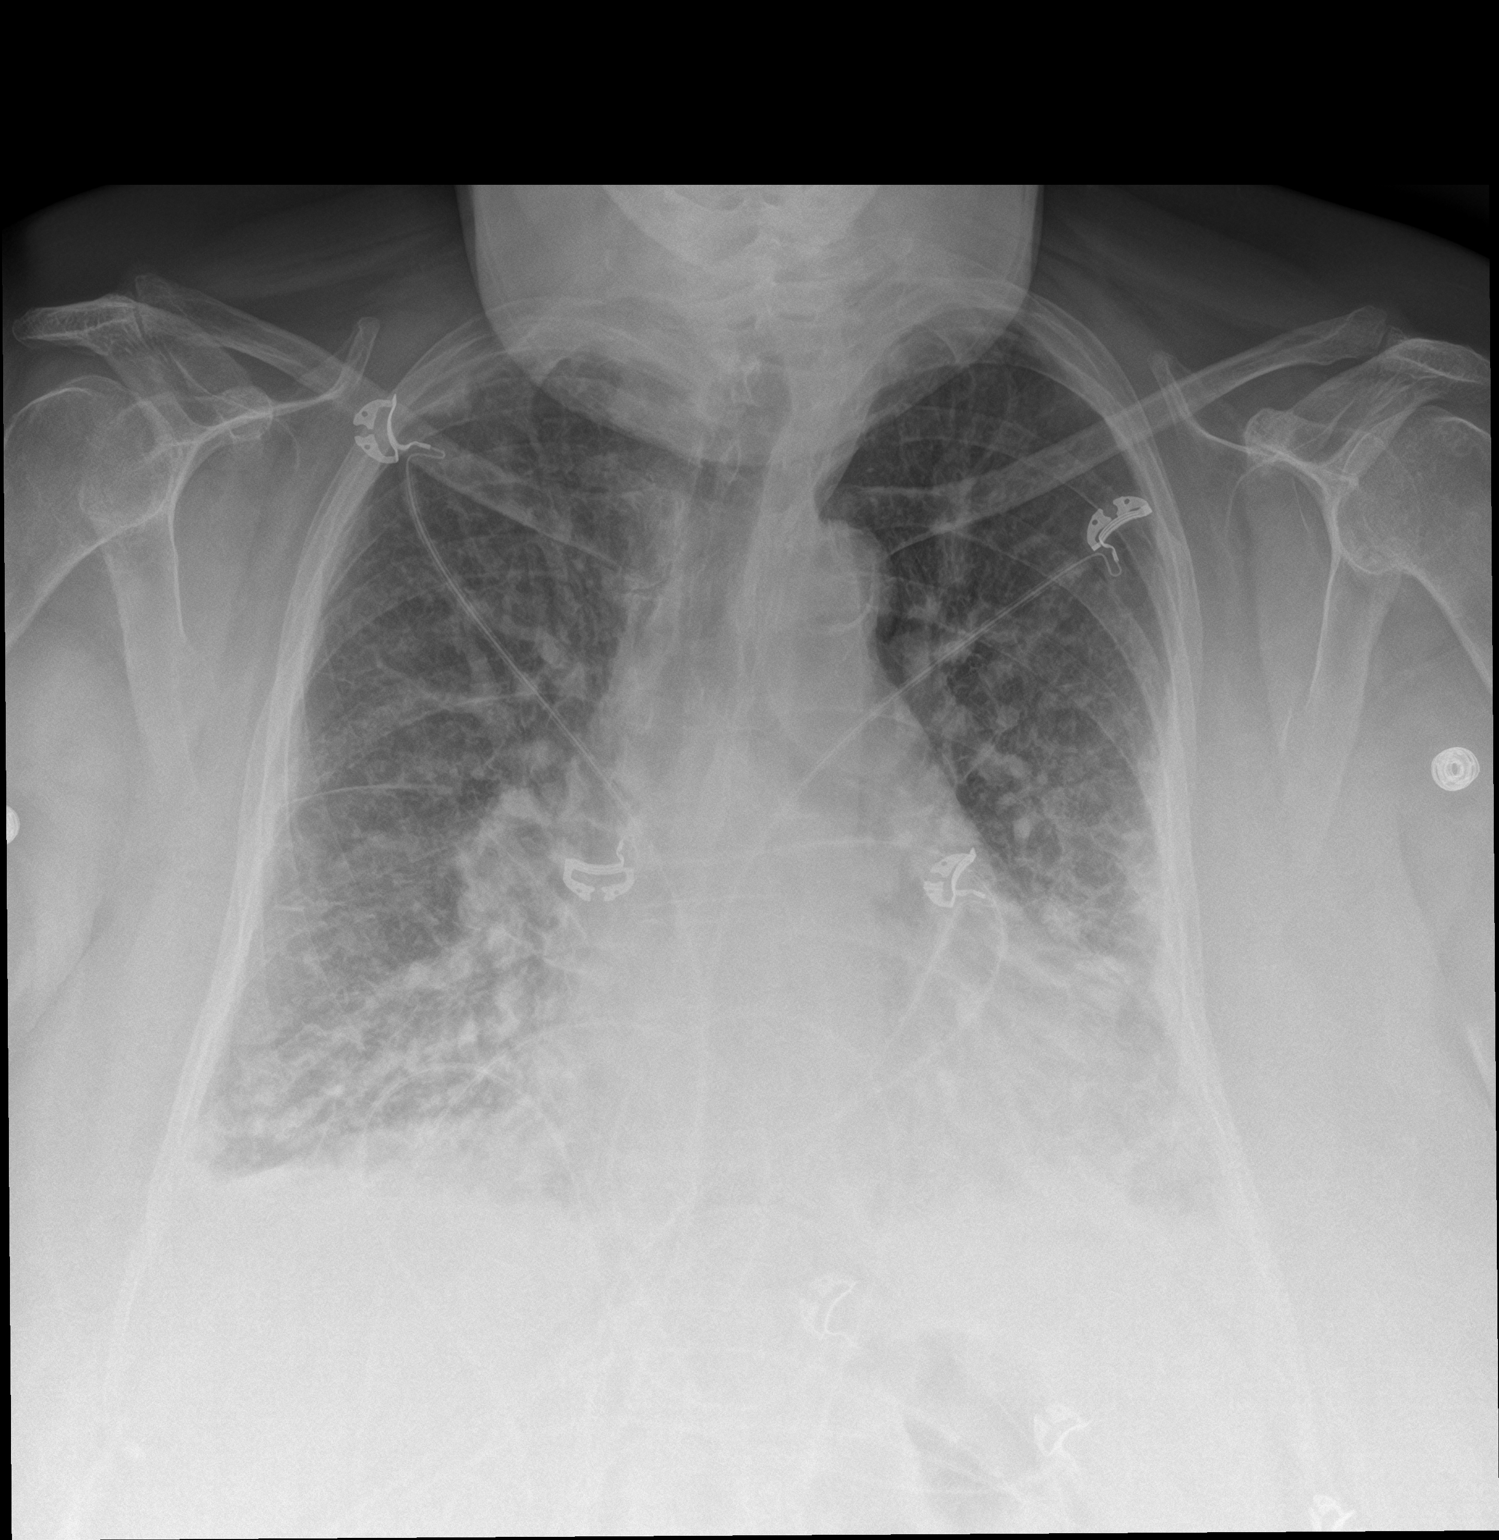

[chest lat]
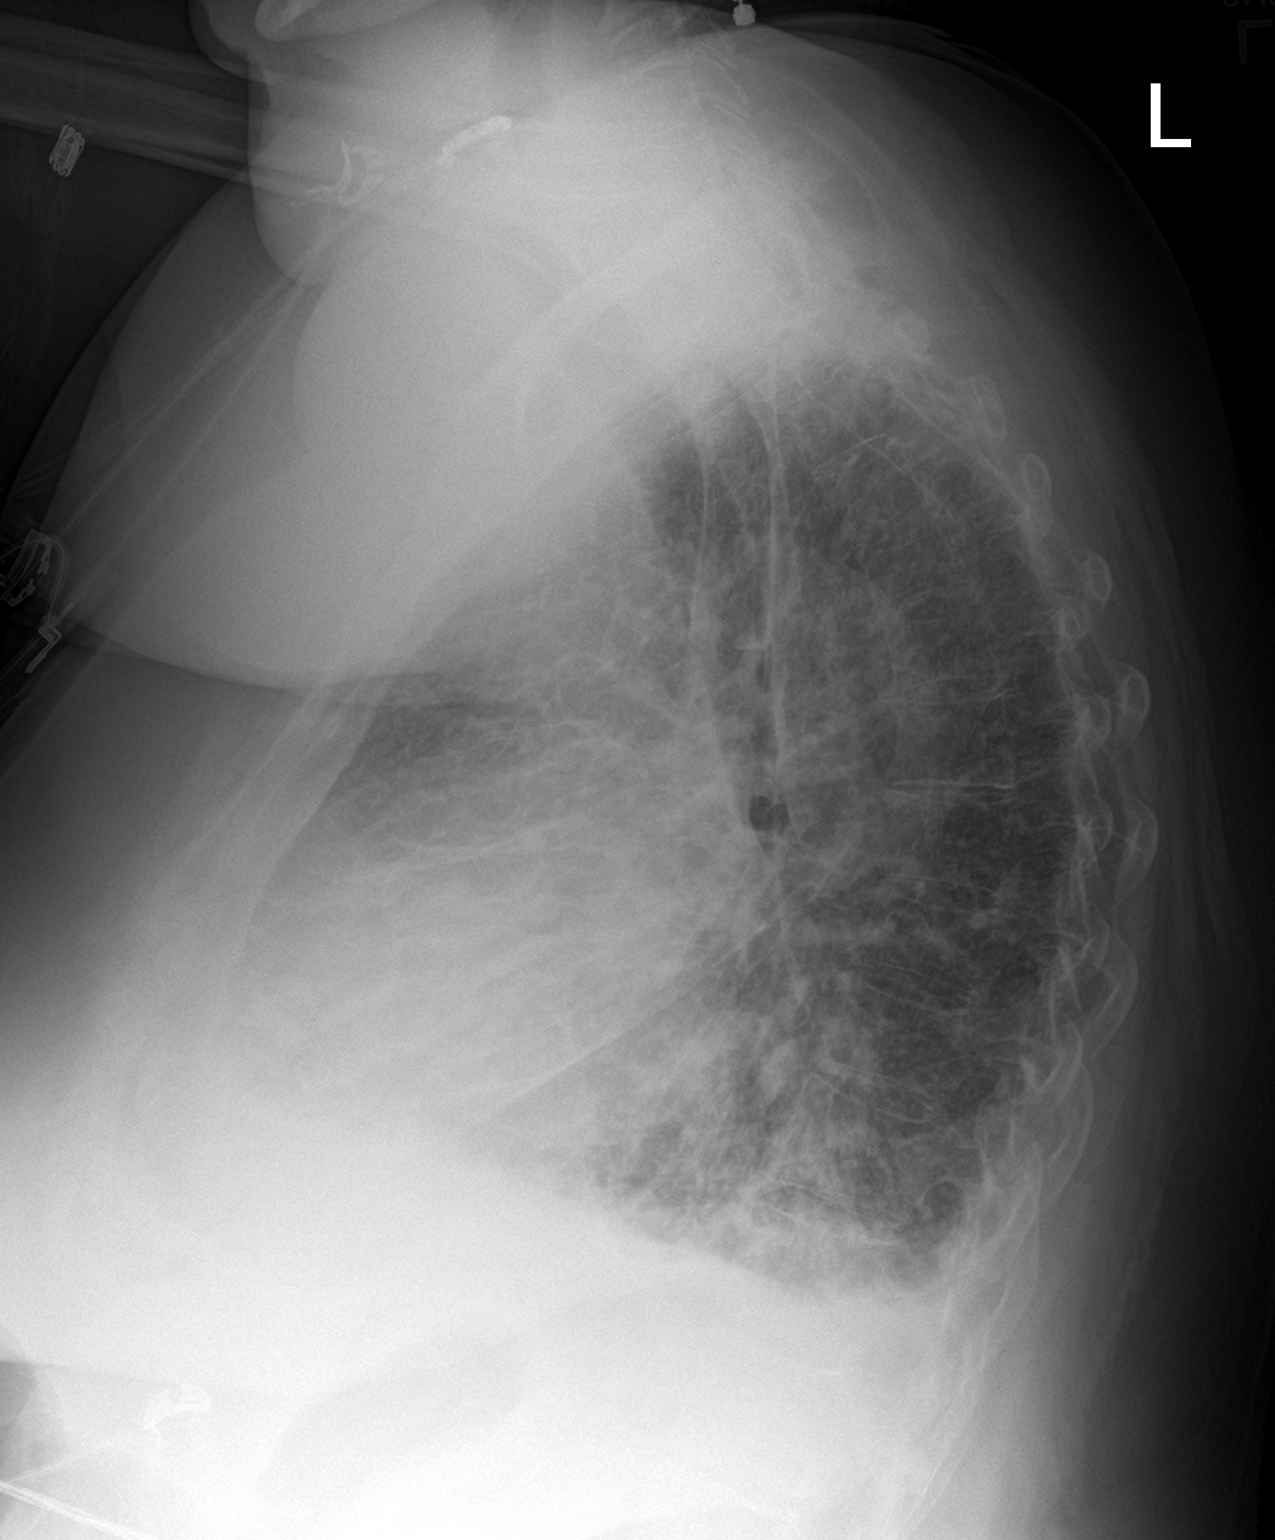

[2 of 2 positions shown; findings below may reference images not displayed]

FINDINGS: Enlargement of cardiac silhouette with pulmonary vascular
congestion.

Atherosclerotic calcification aorta.

Interstitial infiltrates likely pulmonary edema and CHF.

Tiny bibasilar effusions and minimal atelectasis.

No pneumothorax.

Bones demineralized.
IMPRESSION: CHF.

Aortic Atherosclerosis (WNRQF-E05.5).

## 2024-01-04 ENCOUNTER — Encounter: Payer: Self-pay | Admitting: Cardiology

## 2024-01-11 ENCOUNTER — Ambulatory Visit: Attending: Cardiology | Admitting: *Deleted

## 2024-01-11 ENCOUNTER — Encounter: Payer: Self-pay | Admitting: *Deleted

## 2024-01-11 DIAGNOSIS — I480 Paroxysmal atrial fibrillation: Secondary | ICD-10-CM

## 2024-01-11 NOTE — Patient Instructions (Signed)
Your physician recommends that you continue on your current medications as directed. Please refer to the Current Medication list given to you today. Your physician recommends that you schedule a follow-up appointment in: as planned 

## 2024-01-11 NOTE — Progress Notes (Signed)
 Present for nurse visit for EKG and review of home blood pressure log per recent mychart message request by [facility]-Let's bring her in for an EKG in 1 week and log and monitor her blood pressure readings and I will see if we can adjust her medication if needed   Reports taking all medications as prescribed without missed doses or side effects. Reports symptoms are better. Reports home blood pressure readings and heart rates have been normal but did not bring log to visit today. EKG done and routed to provider for review.

## 2024-01-21 ENCOUNTER — Ambulatory Visit: Attending: Nurse Practitioner | Admitting: Nurse Practitioner

## 2024-01-21 ENCOUNTER — Encounter: Payer: Self-pay | Admitting: Nurse Practitioner

## 2024-01-21 VITALS — BP 118/72 | HR 91 | Ht 65.0 in | Wt 266.8 lb

## 2024-01-21 DIAGNOSIS — I480 Paroxysmal atrial fibrillation: Secondary | ICD-10-CM

## 2024-01-21 DIAGNOSIS — J449 Chronic obstructive pulmonary disease, unspecified: Secondary | ICD-10-CM

## 2024-01-21 DIAGNOSIS — I2510 Atherosclerotic heart disease of native coronary artery without angina pectoris: Secondary | ICD-10-CM

## 2024-01-21 DIAGNOSIS — I10 Essential (primary) hypertension: Secondary | ICD-10-CM | POA: Diagnosis not present

## 2024-01-21 DIAGNOSIS — R0609 Other forms of dyspnea: Secondary | ICD-10-CM

## 2024-01-21 MED ORDER — METOPROLOL TARTRATE 25 MG PO TABS: 12.5000 mg | ORAL_TABLET | Freq: Two times a day (BID) | ORAL | 1 refills | Status: AC

## 2024-01-21 NOTE — Progress Notes (Signed)
 Cardiology Office Note:    Date:  01/21/2024 ID:  Brooke [facility], DOB 03/01/1946, MRN [id number] PCP:  Rosamond Leta NOVAK, MD [facility] Providers Cardiologist:  Alvan Carrier, MD Electrophysiologist:  Eulas FORBES Furbish, MD    Referring MD: Rosamond Leta NOVAK, MD   CC: Here for 3 month follow-up  History of Present Illness:    Brooke [facility] is a 78 y.o. female with a PMH of nonobstructive CAD, persistent A-fib/A-flutter, s/p ablation in 2021, COPD on chronic O2, chronic venous insufficiency, GERD, anxiety, and morbid obesity, who presents today for 3 month follow-up.  Diagnosed with nonobstructive CAD by cardiac cath in 2006.  Patient was diagnosed with A-fib/a flutter in 2016 during hospital admission at [facility].  She has been previously followed by Dr. Kelsie and underwent ablation in 2021.  Most recently was admitted in March 2023 with a flutter with RVR, converted in the ED, s/p DCCV at that time.  Last seen by Dr. Carrier Alvan on Sep 03, 2023, showed signs of volume overload. Lasix  increased to 60 mg BID.   10/16/2023 - She is here for follow-up with her husband. Weights are stable. Has not noticed difference with medication change since last office visit. Denies any chest pain, palpitations, syncope, presyncope, dizziness, orthopnea, PND, swelling or significant weight changes, acute bleeding, or claudication. Breathing is stable per her report.   01/21/2024 - Here for follow-up.  She admits to recent issues with A-fib nearly daily for the past 3 weeks and admits to past hx of ablation and past hx of 5 DCCV.  Remains compliant with medication regimen.  She confirms to me she is taking metoprolol  tartrate 25 mg in the morning.  Denies any chest pain, shortness of breath, syncope, presyncope, dizziness, orthopnea, PND, swelling or significant weight changes, acute bleeding, or claudication.   Please see the history of present illness.    All other systems reviewed and  are negative.  EKGs/Labs/Other Studies Reviewed:    The following studies were reviewed today:  EKG:  EKG is not ordered today.   Echo 08/2023:  1. Left ventricular ejection fraction, by estimation, is 60 to 65%. The  left ventricle has normal function. The left ventricle has no regional  wall motion abnormalities. Left ventricular diastolic parameters are  indeterminate.   2. Right ventricular systolic function is normal. The right ventricular  size is normal. Tricuspid regurgitation signal is inadequate for assessing  PA pressure.   3. The mitral valve is normal in structure. No evidence of mitral valve  regurgitation. No evidence of mitral stenosis.   4. The aortic valve is tricuspid. Aortic valve regurgitation is not  visualized. No aortic stenosis is present.   5. The inferior vena cava is normal in size with greater than 50%  respiratory variability, suggesting right atrial pressure of 3 mmHg.   Comparison(s): A prior study was performed on 04/26/2022. EF 60-65%.  CT angio of chest on April 30, 2022: IMPRESSION: No evidence of pulmonary embolism.   Scattered patchy airspace disease bilaterally, most prominent in the upper lobes right middle lobe, consistent with a multifocal infectious/inflammatory process. There are distinct nodular opacities in the left upper lobe and left lower lobe, measuring up to 7 mm and 10 mm respectively, which are indeterminate. Prominent bilateral hilar lymph nodes, favored to be reactive. Recommend follow-up chest CT in 3 months.  Echocardiogram on April 26, 2022:   1. EF is challenging in afib. Left ventricular ejection fraction, by  estimation, is 60 to 65%. The left ventricle has normal function. The left  ventricle has no regional wall motion abnormalities. Left ventricular  diastolic parameters are indeterminate.   2. Right ventricular systolic function is normal. The right ventricular  size is normal. Tricuspid regurgitation  signal is inadequate for assessing  PA pressure.   3. The mitral valve was not well visualized. No evidence of mitral valve  regurgitation.   4. Aortic valve regurgitation is not visualized.   5. The inferior vena cava is normal in size with greater than 50%  respiratory variability, suggesting right atrial pressure of 3 mmHg.   Comparison(s): No significant change from prior study.  A-fib ablation on June 07, 2019: CONCLUSIONS: 1. Sinus rhythm upon presentation.   2. Intracardiac echo reveals a moderate sized left atrium with four separate pulmonary veins without evidence of pulmonary vein stenosis. 3. Successful electrical isolation and anatomical encircling of all four pulmonary veins with radiofrequency current. 4. No inducible arrhythmias following ablation both on and off of Isuprel  5. No early apparent complications.  Cardiac CT/CTA on June 02, 2019:  IMPRESSION: 1. There is normal pulmonary vein drainage into the left atrium. Measurements as reported   2. There is no thrombus in the left atrial appendage.   3. The esophagus courses posterior to ostium of right inferior pulmonary vein   4. Calcium score 14 (41st percentile for age/gender). The study was performed without use of NTG so is not optimal for plaque evaluation, but nonobstructive CAD is seen with calcified plaque in the proximal LAD and proximal LCX causing minimal (0-24%) stenosis  1.  No acute findings in the imaged extracardiac chest. 2.  Aortic Atherosclerosis (ICD10-I70.0). 3. Pulmonary artery enlargement suggests pulmonary arterial hypertension. 4. 2 mm right-sided pulmonary nodule. No follow-up needed if patient is low-risk. Non-contrast chest CT can be considered in 12 months if patient is high-risk. This recommendation follows the consensus statement: Guidelines for Management of Incidental Pulmonary Nodules Detected on CT Images: From the Fleischner Society 2017; Radiology 2017;  284:228-243.  Physical Exam:    VS:  BP 118/72   Pulse 91   Ht 5' 5 (1.651 m)   Wt 266 lb 12.8 oz (121 kg)   SpO2 95% Comment: 2-3 L  BMI 44.40 kg/m     Wt Readings from Last 3 Encounters:  01/21/24 266 lb 12.8 oz (121 kg)  10/16/23 254 lb (115.2 kg)  09/03/23 256 lb (116.1 kg)    GEN: Morbidly obese, 78 y.o. female in no acute distress, on chronic O2 HEENT: Normal NECK: No JVD; No carotid bruits CARDIAC: S1/S2, RRR, no murmurs, rubs, gallops; 2+ radial pulses, equal bilaterally; 1+ PT, equal bilaterally. RESPIRATORY:  Clear and diminished to auscultation without adventitious breath sounds MUSCULOSKELETAL:  No edema noted to BLE; No deformity  SKIN: Warm and dry NEUROLOGIC:  Alert and oriented x 3 PSYCHIATRIC:  Normal affect   ASSESSMENT & PLAN:    In order of problems listed above:  PAF Admits to recent episodes of A-fib for the past 3 weeks, heart rate around 95 bpm.  Will change current metoprolol  to Lopressor  12.5 mg twice daily.  Heart healthy diet and regular cardiovascular exercise as tolerated encouraged. Continue Xarelto  20 mg daily, denies any bleeding issues while on Xarelto .  Dr. Alvan previously recommended that if she were to have recurrent issues with A-fib, will need to see EP and reconsider Tikosyn most likely. Will schedule first available appt with Dr. Nancey.  Pt agrees  with plan. Continue to f/u with EP.   CAD Cath in 2006 revealed nonobstructive CAD and CCTA in 2021 showed minimal plaque with coronary calcium score of 14. Dr. Delford who saw her during hospitalization in 03/2022 stated she had no significant CAD. Stable with no anginal symptoms. No indication for ischemic evaluation. Continue current medication regimen. Heart healthy diet and regular cardiovascular exercise as tolerated encouraged.   HTN BP stable today. BP well controlled at home. Continue current medication regimen. Discussed to monitor BP at home at least 2 hours after medications and  sitting for 5-10 minutes. Heart healthy diet and regular cardiovascular exercise encouraged.   4. COPD, DOE Denies any recent shortness of breath or acute exacerbations. Echo unremarkable. Hx of pulmonary nodules. No medication changes at this time. Care and ED precautions discussed. Continue to follow with PCP and pulmonology.  5. Morbid obesity Weight loss via diet and exercise as tolerated encouraged. Discussed the impact being overweight would have on cardiovascular risk.  6. Disposition: Follow up with me/APP in 15-months or sooner if anything changes.   Medication Adjustments/Labs and Tests Ordered: Current medicines are reviewed at length with the patient today.  Concerns regarding medicines are outlined above.  No orders of the defined types were placed in this encounter.  Meds ordered this encounter  Medications   metoprolol  tartrate (LOPRESSOR ) 25 MG tablet    Sig: Take 0.5 tablets (12.5 mg total) by mouth 2 (two) times daily.    Dispense:  90 tablet    Refill:  1    Dose change 01/21/24    Patient Instructions  Medication Instructions:  Your physician has recommended you make the following change in your medication:  Please change Metoprolol  Tartrate (Lopressor ) to 12.5 Mg twice daily   Labwork: None   Testing/Procedures: None   Follow-Up: Your physician recommends that you schedule a follow-up appointment in:  Please schedule first available with Dr.Mealor  3 Months   Any Other Special Instructions Will Be Listed Below (If Applicable).  If you need a refill on your cardiac medications before your next appointment, please call your pharmacy.   Signed, Almarie Crate, NP

## 2024-01-21 NOTE — Patient Instructions (Addendum)
 Medication Instructions:  Your physician has recommended you make the following change in your medication:  Please change Metoprolol  Tartrate (Lopressor ) to 12.5 Mg twice daily   Labwork: None   Testing/Procedures: None   Follow-Up: Your physician recommends that you schedule a follow-up appointment in:  Please schedule first available with Dr.Mealor  3 Months   Any Other Special Instructions Will Be Listed Below (If Applicable).  If you need a refill on your cardiac medications before your next appointment, please call your pharmacy.

## 2024-01-28 ENCOUNTER — Telehealth: Payer: Self-pay | Admitting: Cardiology

## 2024-01-28 NOTE — Telephone Encounter (Signed)
 Pt would like a callback from Vickie in regards to an appointment with Mealor. Please advise.

## 2024-02-01 ENCOUNTER — Ambulatory Visit: Payer: Self-pay | Admitting: Internal Medicine

## 2024-02-04 ENCOUNTER — Ambulatory Visit (HOSPITAL_BASED_OUTPATIENT_CLINIC_OR_DEPARTMENT_OTHER): Admitting: Pulmonary Disease

## 2024-02-04 ENCOUNTER — Encounter (HOSPITAL_BASED_OUTPATIENT_CLINIC_OR_DEPARTMENT_OTHER): Payer: Self-pay | Admitting: Pulmonary Disease

## 2024-02-04 VITALS — BP 116/76 | HR 91 | Ht 65.0 in | Wt 260.0 lb

## 2024-02-04 DIAGNOSIS — Z9981 Dependence on supplemental oxygen: Secondary | ICD-10-CM | POA: Diagnosis not present

## 2024-02-04 DIAGNOSIS — J849 Interstitial pulmonary disease, unspecified: Secondary | ICD-10-CM

## 2024-02-04 DIAGNOSIS — J9611 Chronic respiratory failure with hypoxia: Secondary | ICD-10-CM

## 2024-02-04 NOTE — Progress Notes (Signed)
 Subjective:    Patient ID: Brooke Luna, female    DOB: June 09, 1945, 78 y.o.   MRN: [id number]  PMH -nonobstructive CAD, persistent A-fib/A-flutter, s/p ablation in 2021,  Smoker 38 pyrs, quit 1998  D/c on 02 03/3022 p CAP and RAF 06/25/2022   Walked on 3 lpm POC  x  1  lap(s) =  approx 150  ft  @ slow pace, stopped due to sob  with lowest 02 sats 91% then dropped to 89% on lap 2 and required 4lpm POC to complete  - 06/11/2023   Walked on 3lpm POC   x  1  lap(s) =  approx 150  ft  @ moderate pace, stopped due to sob with lowest 02 sats 97%       Discussed the use of AI scribe software for clinical note transcription with the patient, who gave verbal consent to proceed.  History of Present Illness Brooke Luna is a 78 year old female with atrial fibrillation and congestive heart failure who presents with chronic respiratory failure and hypoxia. She is accompanied by Larnell, her companion. She was referred by her cardiologist, Dr. Almarie, for evaluation of her chronic respiratory issues.  She experiences significant dyspnea, especially during exertion, leading to rapid desaturation. She uses supplemental oxygen, adjusting the flow rate based on activity level, with 3.5 liters per minute at rest and 4 liters per minute when ambulating. Despite several imaging studies and pulmonary function tests, no significant abnormalities have been identified. She had pneumonia in December 2023, which initiated her oxygen use, and was diagnosed with COPD at that time, though subsequent tests have not confirmed COPD. She is frustrated by the lack of a definitive diagnosis despite multiple evaluations.  She has a history of atrial fibrillation, having undergone cardioversion five times and an ablation. She takes Lasix  60 mg three times daily for fluid management but still experiences some peripheral edema. Her history of congestive heart failure has necessitated various cardiac medications, including  amiodarone  and diltiazem , which were discontinued due to adverse effects.  She has a significant smoking history, having quit in 1998 after approximately 40 years of smoking. She denies significant weakness in her extremities but reports arthritis in her hands and shoulders. She uses a wheelchair due to knee problems and dyspnea, and she has a left knee prosthesis.  She previously experienced pain at the end of her ribs with deep breathing, which has since resolved. 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     Ambulation Sat dropped to 94%    Significant tests/ events reviewed  PFT's 12/28/19 ratio 81, FVC 57%, FEV1 61%  ex ERV 30% with DLCO 14.17 (71%) and corrected to 5.01 (122%) for alv vol   CT chest wo con 07/2022 >>Previous areas of patchy opacities with ground-glass and discrete nodules have shown significant improvement  Echo 08/2023 normal RV  Review of Systems  neg for any significant sore throat, dysphagia, itching, sneezing, nasal congestion or excess/ purulent secretions, fever, chills, sweats, unintended wt loss, pleuritic or exertional cp, hempoptysis, orthopnea pnd or change in chronic leg swelling. Also denies presyncope, palpitations, heartburn, abdominal pain, nausea, vomiting, diarrhea or change in bowel or urinary habits, dysuria,hematuria, rash, arthralgias, visual complaints, headache, numbness weakness or ataxia.      Objective:   Physical Exam  Gen. Pleasant, obese, in no distress ENT - no lesions, no post nasal  drip Neck: No JVD, no thyromegaly, no carotid bruits Lungs: no use of accessory muscles, no dullness to percussion, LLL rales no rhonchi  Cardiovascular: Rhythm regular, heart sounds  normal, no murmurs or gallops, no peripheral edema Musculoskeletal: No deformities, no cyanosis or clubbing , no tremors       Assessment & Plan:   Assessment and Plan Assessment &  Plan Chronic respiratory failure with hypoxia, unclear etiology Chronic respiratory failure with hypoxia of unclear etiology. She experiences significant drops in oxygen saturation upon exertion, requiring supplemental oxygen. Previous pulmonary function tests do not indicate classic COPD, and there is no significant fibrosis or scarring observed in recent imaging. Differential diagnosis includes COPD, pulmonary fibrosis, blood clots, and pulmonary hypertension. Previous echocardiograms do not show evidence of pulmonary hypertension. She has congestive heart failure and atrial fibrillation, which may contribute to her symptoms. The etiology of her hypoxia remains unclear, and further investigation is warranted. - Conduct a walk test to assess oxygen saturation levels during exertion. - Order a high-resolution CT scan of the chest to evaluate for interstitial lung disease or other abnormalities. - Consider right heart catheterization if hypoxia persists and other tests are inconclusive. - Communicate findings and next steps with her cardiologist

## 2024-02-04 NOTE — Patient Instructions (Addendum)
 X Ambulatory sat  Schedule High res Ct of chest   VISIT SUMMARY: During your visit, we discussed your ongoing issues with chronic respiratory failure and hypoxia. Despite multiple tests, the exact cause of your low oxygen levels remains unclear. We reviewed your history, including your use of supplemental oxygen, past pneumonia, and previous diagnoses. We also discussed your heart conditions and the medications you have been taking.  YOUR PLAN: -CHRONIC RESPIRATORY FAILURE WITH HYPOXIA: Chronic respiratory failure with hypoxia means that your body is not getting enough oxygen over a long period. This can cause significant shortness of breath, especially during physical activity. We will conduct a walk test to see how your oxygen levels change when you move around. Additionally, we will order a high-resolution CT scan of your chest to look for any lung diseases or abnormalities. If these tests do not provide answers, we may consider a right heart catheterization to further investigate. We will keep your cardiologist, Dr. Almarie, informed of any findings and next steps.  INSTRUCTIONS: Please schedule a walk test and a high-resolution CT scan of your chest as soon as possible. Follow up with your cardiologist,  Almarie, to discuss the results and any further steps.                      Contains text generated by Abridge.                                 Contains text generated by Abridge.

## 2024-02-08 ENCOUNTER — Other Ambulatory Visit: Payer: Self-pay | Admitting: Cardiology

## 2024-02-09 NOTE — Telephone Encounter (Signed)
 Prescription refill request for Xarelto  received.  Indication:afib Last office visit:9/25 Weight:117.9  kg Age:78 Scr:0.88  5/25 CrCl:98.06  ml/min  Prescription refilled

## 2024-02-25 ENCOUNTER — Other Ambulatory Visit: Payer: Self-pay

## 2024-02-25 ENCOUNTER — Observation Stay (HOSPITAL_COMMUNITY)
Admission: EM | Admit: 2024-02-25 | Discharge: 2024-02-26 | Disposition: A | Discharge status: Home / Self Care | Attending: Family Medicine | Admitting: Family Medicine

## 2024-02-25 ENCOUNTER — Encounter (HOSPITAL_COMMUNITY): Payer: Self-pay

## 2024-02-25 ENCOUNTER — Emergency Department (HOSPITAL_COMMUNITY)

## 2024-02-25 DIAGNOSIS — Z87891 Personal history of nicotine dependence: Secondary | ICD-10-CM | POA: Diagnosis not present

## 2024-02-25 DIAGNOSIS — I509 Heart failure, unspecified: Principal | ICD-10-CM

## 2024-02-25 DIAGNOSIS — I5033 Acute on chronic diastolic (congestive) heart failure: Secondary | ICD-10-CM | POA: Diagnosis not present

## 2024-02-25 DIAGNOSIS — Z96651 Presence of right artificial knee joint: Secondary | ICD-10-CM | POA: Diagnosis not present

## 2024-02-25 DIAGNOSIS — I4819 Other persistent atrial fibrillation: Secondary | ICD-10-CM | POA: Insufficient documentation

## 2024-02-25 DIAGNOSIS — E039 Hypothyroidism, unspecified: Secondary | ICD-10-CM | POA: Insufficient documentation

## 2024-02-25 DIAGNOSIS — I110 Hypertensive heart disease with heart failure: Secondary | ICD-10-CM | POA: Insufficient documentation

## 2024-02-25 DIAGNOSIS — I4891 Unspecified atrial fibrillation: Secondary | ICD-10-CM | POA: Diagnosis not present

## 2024-02-25 DIAGNOSIS — J9611 Chronic respiratory failure with hypoxia: Secondary | ICD-10-CM | POA: Diagnosis not present

## 2024-02-25 DIAGNOSIS — R0602 Shortness of breath: Secondary | ICD-10-CM | POA: Diagnosis present

## 2024-02-25 DIAGNOSIS — J449 Chronic obstructive pulmonary disease, unspecified: Secondary | ICD-10-CM | POA: Diagnosis not present

## 2024-02-25 DIAGNOSIS — I10 Essential (primary) hypertension: Secondary | ICD-10-CM | POA: Diagnosis present

## 2024-02-25 LAB — CBC WITH DIFFERENTIAL/PLATELET
Abs Immature Granulocytes: 0.04 K/uL (ref 0.00–0.07)
Basophils Absolute: 0.1 K/uL (ref 0.0–0.1)
Basophils Relative: 0 %
Eosinophils Absolute: 0.5 K/uL (ref 0.0–0.5)
Eosinophils Relative: 4 %
HCT: 38.9 % (ref 36.0–46.0)
Hemoglobin: 12.4 g/dL (ref 12.0–15.0)
Immature Granulocytes: 0 %
Lymphocytes Relative: 6 %
Lymphs Abs: 0.8 K/uL (ref 0.7–4.0)
MCH: 28.4 pg (ref 26.0–34.0)
MCHC: 31.9 g/dL (ref 30.0–36.0)
MCV: 89 fL (ref 80.0–100.0)
Monocytes Absolute: 0.8 K/uL (ref 0.1–1.0)
Monocytes Relative: 6 %
Neutro Abs: 10.6 K/uL — ABNORMAL HIGH (ref 1.7–7.7)
Neutrophils Relative %: 84 %
Platelets: 319 K/uL (ref 150–400)
RBC: 4.37 MIL/uL (ref 3.87–5.11)
RDW: 14 % (ref 11.5–15.5)
WBC: 12.8 K/uL — ABNORMAL HIGH (ref 4.0–10.5)
nRBC: 0 % (ref 0.0–0.2)

## 2024-02-25 LAB — COMPREHENSIVE METABOLIC PANEL WITH GFR
ALT: 11 U/L (ref 0–44)
AST: 15 U/L (ref 15–41)
Albumin: 4 g/dL (ref 3.5–5.0)
Alkaline Phosphatase: 82 U/L (ref 38–126)
Anion gap: 9 (ref 5–15)
BUN: 10 mg/dL (ref 8–23)
CO2: 33 mmol/L — ABNORMAL HIGH (ref 22–32)
Calcium: 8.7 mg/dL — ABNORMAL LOW (ref 8.9–10.3)
Chloride: 98 mmol/L (ref 98–111)
Creatinine, Ser: 0.7 mg/dL (ref 0.44–1.00)
GFR, Estimated: >60 mL/min (ref >60)
Glucose, Bld: 125 mg/dL — ABNORMAL HIGH (ref 70–99)
Potassium: 4.1 mmol/L (ref 3.5–5.1)
Sodium: 139 mmol/L (ref 135–145)
Total Bilirubin: 0.6 mg/dL (ref 0.0–1.2)
Total Protein: 6.5 g/dL (ref 6.5–8.1)

## 2024-02-25 LAB — BLOOD GAS, VENOUS
Acid-Base Excess: 8.4 mmol/L — ABNORMAL HIGH (ref 0.0–2.0)
Bicarbonate: 35.5 mmol/L — ABNORMAL HIGH (ref 20.0–28.0)
Drawn by: 442
O2 Saturation: 84.6 %
Patient temperature: 36.8
pCO2, Ven: 60 mmHg (ref 44–60)
pH, Ven: 7.38 (ref 7.25–7.43)
pO2, Ven: 48 mmHg — ABNORMAL HIGH (ref 32–45)

## 2024-02-25 LAB — RESP PANEL BY RT-PCR (RSV, FLU A&B, COVID)  RVPGX2
Influenza A by PCR: NEGATIVE
Influenza B by PCR: NEGATIVE
Resp Syncytial Virus by PCR: NEGATIVE
SARS Coronavirus 2 by RT PCR: NEGATIVE

## 2024-02-25 LAB — TROPONIN T, HIGH SENSITIVITY
Troponin T High Sensitivity: <15 ng/L (ref 0–19)
Troponin T High Sensitivity: <15 ng/L (ref 0–19)

## 2024-02-25 LAB — MAGNESIUM: Magnesium: 2.1 mg/dL (ref 1.7–2.4)

## 2024-02-25 LAB — PRO BRAIN NATRIURETIC PEPTIDE: Pro Brain Natriuretic Peptide: 475 pg/mL — ABNORMAL HIGH (ref <300.0)

## 2024-02-25 IMAGING — DX DG CHEST 1V PORT
1 series · 1 of 1 positions shown · non-contrast
Comparison: 04/22/2021

CLINICAL DATA: Shortness of breath

EXAM:
PORTABLE CHEST 1 VIEW

[chest ap]
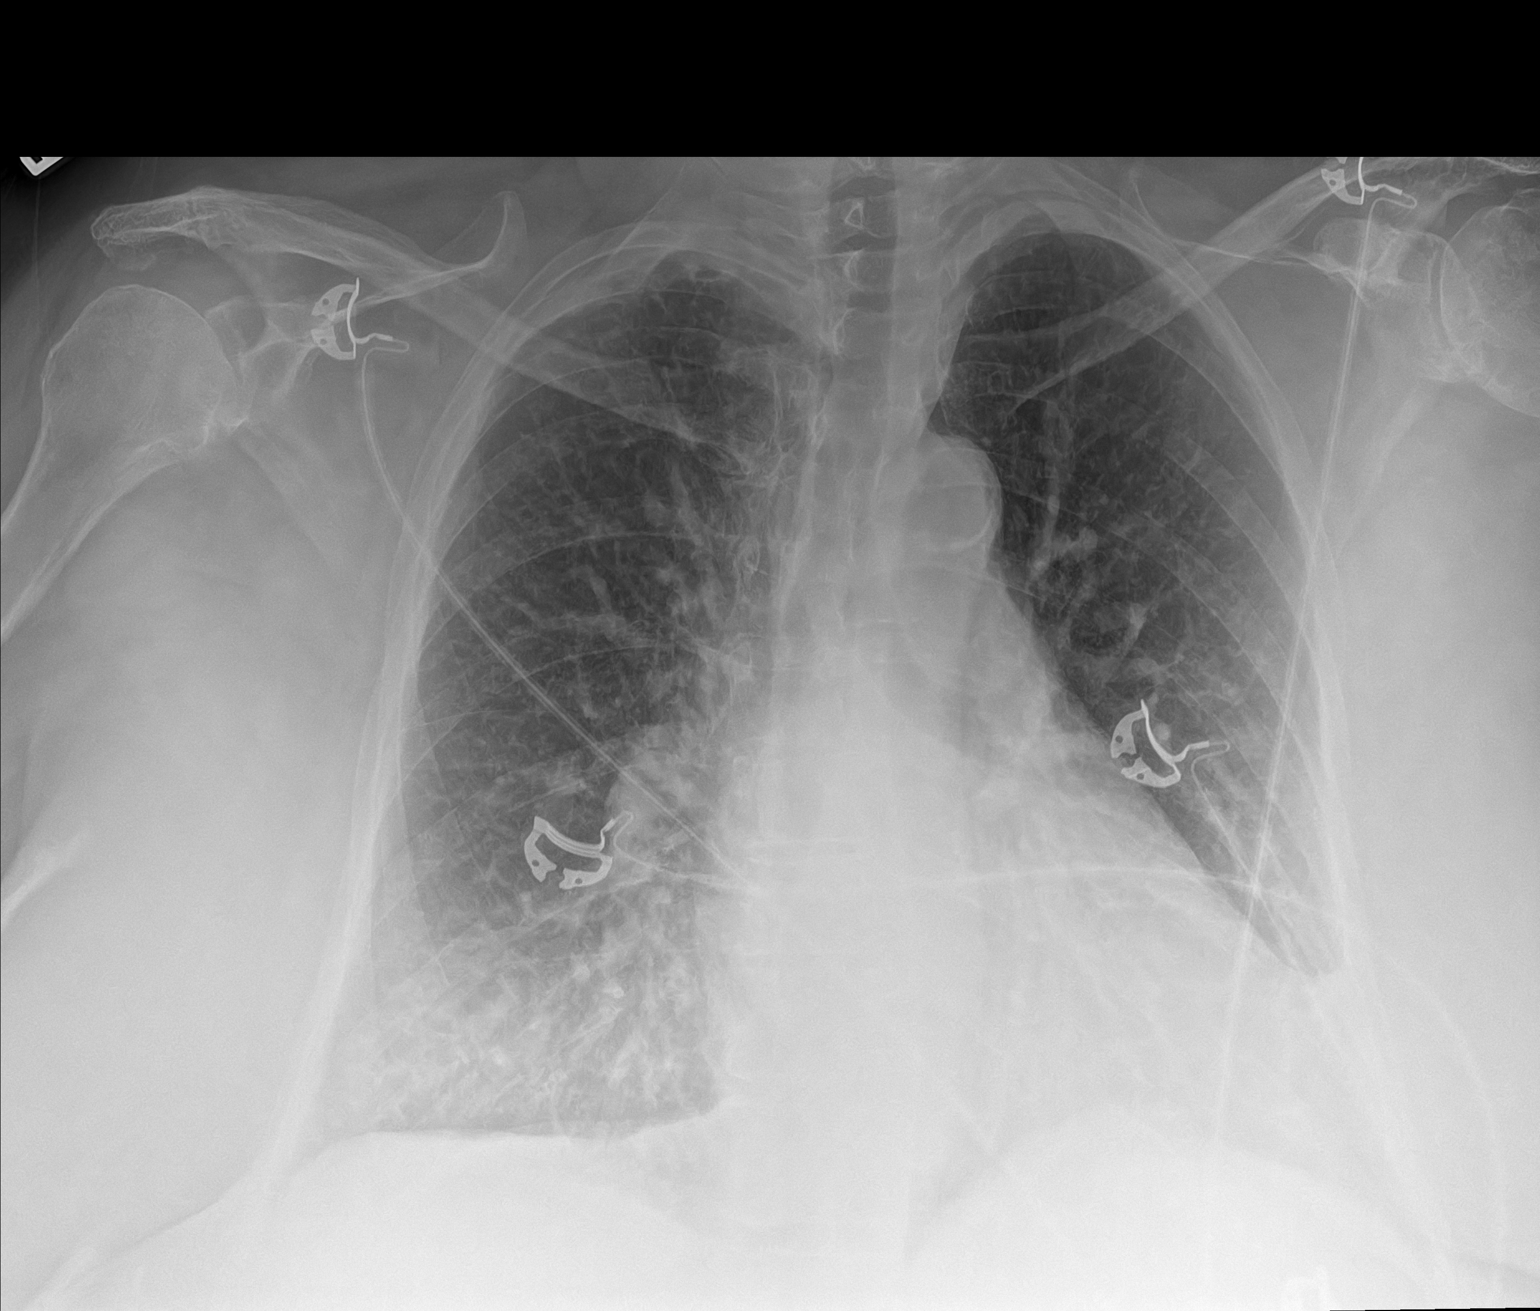

[1 of 1 positions shown; findings below may reference images not displayed]

FINDINGS: There is hyperinflation of the lungs compatible with COPD. Heart is
mildly enlarged. Vascular congestion. No confluent opacities,
effusions or edema. No acute bony abnormality.
IMPRESSION: COPD.

Cardiomegaly, vascular congestion.

## 2024-02-25 MED ORDER — ONDANSETRON HCL 4 MG PO TABS: 4.0000 mg | ORAL_TABLET | Freq: Four times a day (QID) | ORAL | Status: DC | PRN

## 2024-02-25 MED ORDER — POLYETHYLENE GLYCOL 3350 17 G PO PACK: 17.0000 g | PACK | Freq: Every day | ORAL | Status: DC | PRN

## 2024-02-25 MED ORDER — IPRATROPIUM-ALBUTEROL 0.5-2.5 (3) MG/3ML IN SOLN
3.0000 mL | Freq: Once | RESPIRATORY_TRACT | Status: AC
  Administered 2024-02-25: 3 mL via RESPIRATORY_TRACT
  Filled 2024-02-25: qty 3

## 2024-02-25 MED ORDER — ACETAMINOPHEN 325 MG PO TABS
650.0000 mg | ORAL_TABLET | Freq: Once | ORAL | Status: AC
  Administered 2024-02-25: 650 mg via ORAL
  Filled 2024-02-25: qty 2

## 2024-02-25 MED ORDER — GUAIFENESIN-DM 100-10 MG/5ML PO SYRP
15.0000 mL | ORAL_SOLUTION | Freq: Three times a day (TID) | ORAL | Status: DC
  Administered 2024-02-25 – 2024-02-26 (×2): 15 mL via ORAL
  Filled 2024-02-25 (×2): qty 15

## 2024-02-25 MED ORDER — ACETAMINOPHEN 650 MG RE SUPP: 650.0000 mg | Freq: Four times a day (QID) | RECTAL | Status: DC | PRN

## 2024-02-25 MED ORDER — SODIUM CHLORIDE 0.9 % IV SOLN
2.0000 g | INTRAVENOUS | Status: DC
  Administered 2024-02-25: 2 g via INTRAVENOUS
  Filled 2024-02-25: qty 20

## 2024-02-25 MED ORDER — METOPROLOL TARTRATE 25 MG PO TABS
12.5000 mg | ORAL_TABLET | Freq: Two times a day (BID) | ORAL | Status: DC
  Administered 2024-02-25 – 2024-02-26 (×2): 12.5 mg via ORAL
  Filled 2024-02-25 (×2): qty 1

## 2024-02-25 MED ORDER — FUROSEMIDE 40 MG PO TABS
60.0000 mg | ORAL_TABLET | Freq: Every day | ORAL | Status: DC
  Administered 2024-02-26: 60 mg via ORAL
  Filled 2024-02-25: qty 1

## 2024-02-25 MED ORDER — FUROSEMIDE 10 MG/ML IJ SOLN
60.0000 mg | Freq: Once | INTRAMUSCULAR | Status: AC
  Administered 2024-02-25: 60 mg via INTRAVENOUS
  Filled 2024-02-25: qty 6

## 2024-02-25 MED ORDER — DILTIAZEM HCL ER COATED BEADS 120 MG PO CP24
120.0000 mg | ORAL_CAPSULE | Freq: Every day | ORAL | Status: DC
  Administered 2024-02-26: 120 mg via ORAL
  Filled 2024-02-25: qty 1

## 2024-02-25 MED ORDER — ONDANSETRON HCL 4 MG/2ML IJ SOLN: 4.0000 mg | Freq: Four times a day (QID) | INTRAMUSCULAR | Status: DC | PRN

## 2024-02-25 MED ORDER — SODIUM CHLORIDE 0.9 % IV SOLN
500.0000 mg | INTRAVENOUS | Status: DC
  Administered 2024-02-26: 500 mg via INTRAVENOUS
  Filled 2024-02-25: qty 5

## 2024-02-25 MED ORDER — IPRATROPIUM-ALBUTEROL 0.5-2.5 (3) MG/3ML IN SOLN: 3.0000 mL | RESPIRATORY_TRACT | Status: DC | PRN

## 2024-02-25 MED ORDER — RIVAROXABAN 20 MG PO TABS
20.0000 mg | ORAL_TABLET | Freq: Every day | ORAL | Status: DC
  Administered 2024-02-26: 20 mg via ORAL
  Filled 2024-02-25: qty 1

## 2024-02-25 MED ORDER — CITALOPRAM HYDROBROMIDE 20 MG PO TABS
20.0000 mg | ORAL_TABLET | Freq: Every day | ORAL | Status: DC
  Administered 2024-02-26: 20 mg via ORAL
  Filled 2024-02-25: qty 1

## 2024-02-25 MED ORDER — ACETAMINOPHEN 325 MG PO TABS
650.0000 mg | ORAL_TABLET | Freq: Four times a day (QID) | ORAL | Status: DC | PRN
  Administered 2024-02-26: 650 mg via ORAL
  Filled 2024-02-25: qty 2

## 2024-02-25 MED ORDER — POTASSIUM CHLORIDE CRYS ER 20 MEQ PO TBCR
40.0000 meq | EXTENDED_RELEASE_TABLET | Freq: Once | ORAL | Status: AC
  Administered 2024-02-25: 40 meq via ORAL
  Filled 2024-02-25: qty 2

## 2024-02-25 NOTE — Plan of Care (Signed)
   Problem: Activity: Goal: Risk for activity intolerance will decrease Outcome: Progressing   Problem: Coping: Goal: Level of anxiety will decrease Outcome: Progressing

## 2024-02-25 NOTE — H&P (Signed)
 History and Physical    Brooke Luna DOB: [birth year] DOA: 02/25/2024  PCP: Rosamond Leta NOVAK, MD   Patient coming from: Home  I have personally briefly reviewed patient's old medical records in [facility]  Chief Complaint: Difficulty breathing  HPI: Brooke Luna is a 78 y.o. female with medical history significant for diastolic CHF, COPD, atrial fibrillation, hypertension, hypothyroidism, chronic respiratory failure. Patient presented to ED with complaints of difficulty breathing, started about 2 days ago.  She also reports chest tightness, dry cough.  She had a recent trip to [facility], and reports some lower extremity swelling after the trip, worse on the left.  She reports baseline weight is 264 pounds, and she has added about 3 to 4 pounds.  she is on Lasix  60 mg daily and has been compliant, she was told to increase her Lasix  if she had swelling-she increased to 80 mg daily for 2 days and is back to her regular dose.  She reports compliance with her Xarelto .  She is on 2 to 3 L at baseline.  She follows with Dr. Jude as outpatient, and he is trying to wean her O2 down-currently she is now using her O2 mostly with activity.   ED Course: Temperature 98.3.  Heart rate 60s.  Respiratory rate 14-28.  Blood pressure systolic 120s to 849d.  O2 sats > 93% on 3 L.   Troponin less than 15 x 2  proBNP 475.  Magnesium 2.1.   VBG shows pH of 7.38, pCO2 of 60.  WBC 12.8. IV Lasix  60 mg x 1 given.  DuoNebs given. As High-resolution CT was already ordered by pulmonologist, this was done in the ED- Mosaic attenuation with multifocal air trapping, consistent with small airway disease; superimposed discoid atelectasis in the left mid lung. No confluent pulmonary infiltrate, pneumothorax, or pleural effusion. Hospitalist admit for CHF.  Review of Systems: As per HPI all other systems reviewed and negative.  Past Medical History:  Diagnosis Date   Arthritis    pain and oa   right knee and pain in rt leg and foot--also pain left knee-but right is worse   Atrial fibrillation (HCC)    Back pain    lumbar bulging disk   CHF (congestive heart failure) (HCC)    Contact dermatitis    underside of left leg--always in the same spots--comes and goes   Diverticulosis    GERD (gastroesophageal reflux disease)    diet control   Hemorrhoids    Obesity    Persistent atrial fibrillation (HCC)     Past Surgical History:  Procedure Laterality Date   ATRIAL FIBRILLATION ABLATION N/A 06/07/2019   Procedure: ATRIAL FIBRILLATION ABLATION;  Surgeon: Kelsie Agent, MD;  Location: [facility] INVASIVE CV LAB;  Service: Cardiovascular;  Laterality: N/A;   CARDIOVERSION N/A 05/09/2019   Procedure: CARDIOVERSION;  Surgeon: Lonni Slain, MD;  Location[facility] ENDOSCOPY;  Service: Cardiovascular;  Laterality: N/A;   CATARACT EXTRACTION W/PHACO Left 02/22/2015   Procedure: CATARACT EXTRACTION PHACO AND INTRAOCULAR LENS PLACEMENT LEFT EYE CDE=5.41;  Surgeon: Cherene Mania, MD;  Location: [facility] ORS;  Service: Ophthalmology;  Laterality: Left;   CATARACT EXTRACTION W/PHACO Right 03/26/2015   Procedure: CATARACT EXTRACTION PHACO AND INTRAOCULAR LENS PLACEMENT (IOC);  Surgeon: Cherene Mania, MD;  Location: [facility] ORS;  Service: Ophthalmology;  Laterality: Right;  CDE:5.01   CHOLECYSTECTOMY     surgery for broken right elbow Right    TOTAL KNEE ARTHROPLASTY  11/03/2011   Procedure: TOTAL KNEE ARTHROPLASTY;  Surgeon: Dempsey LULLA Moan, MD;  Location: [facility] ORS;  Service: Orthopedics;  Laterality: Right;   TUBAL LIGATION       reports that she quit smoking about 27 years ago. Her smoking use included cigarettes. She started smoking about 66 years ago. She has a 38.8 pack-year smoking history. She has never used smokeless tobacco. She reports current alcohol  use of about 4.0 standard drinks of alcohol  per week. She reports that she does not use drugs.  Allergies  Allergen Reactions   Codeine Shortness Of Breath and  Itching   Meloxicam Other (See Comments)    Stomach iritation     Family History  Problem Relation Age of Onset   Diabetes Mother    Stroke Mother    Stroke Father     Prior to Admission medications   Medication Sig Start Date End Date Taking? Authorizing Provider  citalopram  (CELEXA ) 20 MG tablet Take 20 mg by mouth daily. 10/28/12  Yes [provider]  clonazePAM  (KLONOPIN ) 0.5 MG tablet Take 0.5 mg by mouth 2 (two) times daily as needed for anxiety.   Yes [provider]  cyanocobalamin (VITAMIN B12) 1000 MCG tablet Take 1,000 mcg by mouth daily.   Yes [provider]  diltiazem  (CARDIZEM  CD) 120 MG 24 hr capsule TAKE 1 CAPSULE BY MOUTH EVERY DAY 09/23/23  Yes Branch, Dorn FALCON, MD  diltiazem  (CARDIZEM ) 30 MG tablet Take 1 tablet (30 mg total) by mouth daily as needed (afib). 07/08/21  Yes Branch, Dorn FALCON, MD  furosemide  (LASIX ) 20 MG tablet TAKE 3 TABLETS BY MOUTH DAILY.MAY TAKE AN ADDITIONAL 20 MG DAILY AS NEEDED FOR WEIGHT > 263 LBS OR SWELLING 12/01/23  Yes Branch, Dorn FALCON, MD  ibuprofen (ADVIL) 200 MG tablet Take 200 mg by mouth every 6 (six) hours as needed for fever, headache or mild pain.   Yes [provider]  metoprolol  tartrate (LOPRESSOR ) 25 MG tablet Take 0.5 tablets (12.5 mg total) by mouth 2 (two) times daily. 01/21/24  Yes Miriam Norris, NP  XARELTO  20 MG TABS tablet TAKE ONE TABLET BY MOUTH EVERY DAY 02/09/24  Yes Branch, Dorn FALCON, MD  gentamicin (GARAMYCIN) 0.3 % ophthalmic solution Place 1 drop into both eyes 3 (three) times daily. Patient not taking: Reported on 02/25/2024 02/15/24   [provider]    Physical Exam: Vitals:   02/25/24 2120 02/25/24 2125 02/25/24 2130 02/25/24 2200  BP:   (!) 145/55 (!) 141/59  Pulse: 69 69 68 69  Resp: 19 19 18 14   Temp:      TempSrc:      SpO2: 94% 94% 94% 93%  Weight:        Constitutional: NAD, calm, comfortable Vitals:   02/25/24 2120 02/25/24 2125 02/25/24 2130  02/25/24 2200  BP:   (!) 145/55 (!) 141/59  Pulse: 69 69 68 69  Resp: 19 19 18 14   Temp:      TempSrc:      SpO2: 94% 94% 94% 93%  Weight:       Eyes: PERRL, lids and conjunctivae normal ENMT: Mucous membranes are moist.   Neck: normal, supple, no masses, no thyromegaly Respiratory: Reduced air entry bilaterally, clear to auscultation bilaterally, no wheezing, no crackles. Normal respiratory effort. No accessory muscle use.  Cardiovascular: Regular rate and rhythm, no murmurs / rubs / gallops.  Trace bilateral lower extremity edema, extremities warm..  Abdomen: no tenderness, no masses palpated. No hepatosplenomegaly. Bowel sounds positive.  Musculoskeletal: no clubbing /  cyanosis. No joint deformity upper and lower extremities.  Skin: no rashes, lesions, ulcers. No induration Neurologic: No facial asymmetry, moving extremities spontaneously, speech fluent Psychiatric: Normal judgment and insight. Alert and oriented x 3. Normal mood.   Labs on Admission: I have personally reviewed following labs and imaging studies  CBC: Recent Labs  Lab 02/25/24 1919  WBC 12.8*  NEUTROABS 10.6*  HGB 12.4  HCT 38.9  MCV 89.0  PLT 319   Basic Metabolic Panel: Recent Labs  Lab 02/25/24 1919  NA 139  K 4.1  CL 98  CO2 33*  GLUCOSE 125*  BUN 10  CREATININE 0.70  CALCIUM 8.7*  MG 2.1   GFR: Estimated Creatinine Clearance: 74.5 mL/min (by C-G formula based on SCr of 0.7 mg/dL). Liver Function Tests: Recent Labs  Lab 02/25/24 1919  AST 15  ALT 11  ALKPHOS 82  BILITOT 0.6  PROT 6.5  ALBUMIN 4.0   BNP (last 3 results) Recent Labs    02/25/24 1919  PROBNP 475.0*   Radiological Exams on Admission: CT Chest High Resolution Result Date: 02/25/2024 EXAM: HIGH RESOLUTION CHEST 02/25/2024 08:16:07 PM TECHNIQUE: CT of the chest was performed without the administration of intravenous contrast. High resolution CT imaging was performed of the lungs. Multiplanar reformatted images are  provided for review. Automated exposure control, iterative reconstruction, and/or weight based adjustment of the mA/kV was utilized to reduce the radiation dose to as low as reasonably achievable. High resolution CT images were performed in the supine inspiration, supine expiration, and prone inspiration positions. COMPARISON: 08/08/2022 CLINICAL HISTORY: Heart failure, new ischemic symptoms. FINDINGS: MEDIASTINUM AND LYMPH NODES: Global cardiac size is mildly enlarged. Moderate coronary artery calcification. No pericardial effusion. The central pulmonary arteries are enlarged in keeping with changes of pulmonary arterial hypertension. Mild atherosclerotic calcification within the thoracic aorta. No aortic aneurysm. No mediastinal, hilar or axillary lymphadenopathy. HRCT FINDINGS AND LUNGS AND PLEURA: No pneumothorax or pleural effusion. The central airways are clear. Mosaic attenuation of the pulmonary parenchyma seen in keeping with multifocal air trapping suggesting small airway disease. Superimposed discoid atelectasis within the left mid lung zone. No confluent pulmonary infiltrate. No significant pulmonary nodules. No significant regions of subpleural reticulation or groundglass opacity. No traction bronchiectasis. No frank honeycombing. No significant air trapping or tracheobronchomalacia on the expiration sequence. UPPER ABDOMEN: No acute abnormality. SOFT TISSUES AND BONES: Osseous structures are age appropriate. No acute bone abnormality. No lytic or blastic bone lesion. No acute abnormality of the soft tissues. IMPRESSION: 1. Mosaic attenuation with multifocal air trapping, consistent with small airway disease; superimposed discoid atelectasis in the left mid lung. No confluent pulmonary infiltrate, pneumothorax, or pleural effusion. 2. Enlarged central pulmonary arteries, consistent with pulmonary arterial hypertension. 3. Mild cardiomegaly 4. raf score: aortic atherosclerosis (icd10-i70.0)  Electronically signed by: Dorethia Molt MD 02/25/2024 08:50 PM EDT RP Workstation: [id number]   DG Chest Port 1 View Result Date: 02/25/2024 EXAM: 1 VIEW XRAY OF THE CHEST 02/25/2024 06:54:00 PM COMPARISON: 06/10/2021, 06/02/2022, and 06/21/2022. CLINICAL HISTORY: SOB (shortness of breath). FINDINGS: LUNGS AND PLEURA: No focal pulmonary opacity. Probable mild interstitial edema. No pleural effusion. No pneumothorax. HEART AND MEDIASTINUM: Chronic cardiomegaly. Central vascular congestion. Aortic atherosclerosis. BONES AND SOFT TISSUES: No acute osseous abnormality. IMPRESSION: 1. Cardiomegaly with Central vascular congestion with probable mild interstitial edema. 2. No consolidation, pleural effusion, or pneumothorax. 3. Aortic atherosclerosis. Electronically signed by: Luke Bun MD 02/25/2024 07:01 PM EDT RP Workstation: [id number]   EKG: Independently reviewed.  Ectopic atrial  rhythm, P waves occasionally present, rhythm slightly irregular, heart rate 67, QTc 477.  Assessment/Plan Principal Problem:   Acute on chronic diastolic (congestive) heart failure (HCC) Active Problems:   COPD (chronic obstructive pulmonary disease) (HCC)   Atrial fibrillation (HCC)   Chronic respiratory failure with hypoxia (HCC)   Essential hypertension   Hypothyroidism   Assessment and Plan:  Acute on chronic diastolic CHF-dyspnea likely mild component of both CHF and possibly pneumonia. Reports 3 to 4 pound weight gain, trace bilateral lower extremity swelling.  proBNP 475. (No prior to compare).  Last echo 08/2023 EF 60 to 65%. - IV Lasix  60 mg x 1 given in ED, further dosing pending clinical course otherwise- resume home dose Lasix  60 mg daily in a.m.  COPD -reduced air entry bilaterally, dry cough.  No rhonchi or wheeze.  WBC 12.8.  High-resolution CT- was ordered as outpatient by pulmonologist, so was done as imaging of choice in ED today.  Mosaic attenuation with multifocal air trapping, consistent with  small airway disease; superimposed discoid atelectasis in the left mid lung. No confluent pulmonary infiltrate, pneumothorax, or pleural effusion.  Has been compliant with Xarelto . - Treat small airway disease as pneumonia at this time, with IV ceftriaxone and azithromycin  - DuoNebs as needed - Mucolytics - Hold off on steroids for now  Chronic respiratory failure-on 2 to 3 L.  Sats currently > 93% on 3 L  Atrial fibrillation-rate controlled and on anticoagulation with Xarelto . - Resume diltiazem , Xarelto , metoprolol   Hypertension-stable. - Resume diltiazem , metoprolol , diuretics  Hypothyroidism -Resume Synthroid  DVT prophylaxis: Xarelto  Code Status: FULL Family Communication: None at bedside Disposition Plan: 1- 2 days Consults called: None Admission status:  obs tele   Author: Tully FORBES Carwin, MD 02/25/2024 10:44 PM  For on call review [email or web address].

## 2024-02-25 NOTE — ED Provider Notes (Signed)
 [place] EMERGENCY DEPARTMENT AT [facility] Provider Note   CSN: [id number] Arrival date & time: 02/25/24  8190     Patient presents with: Shortness of Breath   Brooke Luna is a 78 y.o. female.    Shortness of Breath Associated symptoms: chest pain and headaches   Associated symptoms: no fever and no vomiting        Brooke Luna is a 78 y.o. female past medical history of persistent atrial fibrillation, anticoagulated, GERD, back pain, COPD and CHF who presents to the Emergency Department complaining of chest tightness, cough temporal headache and shortness of breath worsening from baseline.  States she is chronically on 3 L oxygen continuously.  She states she was recently evaluated by her pulmonologist, Dr. Jude approximately 3 weeks ago and she was advised to trial tapering down from her oxygen requirement.  States she has been only using her oxygen during activity and has been trying to avoid using her oxygen while at rest.  Developed tightness of her chest with cough yesterday.  Cough is mostly nonproductive.  She also endorses some increased left lower leg swelling over the weekend after a trip to [place].  After the swelling, she increased her Lasix  dose from her baseline of 60 mg daily to 80 mg daily for 2 days.  She is now back to her 60 mg daily dose.  She also endorses orthopnea but states this is baseline for her and the orthopnea has not worsened.  She denies any significant weight gain, fever, chills, hemoptysis    Prior to Admission medications   Medication Sig Start Date End Date Taking? Authorizing Provider  acetaminophen  (TYLENOL ) 325 MG tablet Take 650 mg by mouth every 6 (six) hours as needed for moderate pain or headache.    [provider]  citalopram  (CELEXA ) 20 MG tablet Take 20 mg by mouth daily. 10/28/12   [provider]  clonazePAM  (KLONOPIN ) 0.5 MG tablet Take 0.5 mg by mouth 2 (two) times daily as needed for anxiety.     [provider]  cyanocobalamin (VITAMIN B12) 1000 MCG tablet Take 1,000 mcg by mouth daily.    [provider]  diltiazem  (CARDIZEM  CD) 120 MG 24 hr capsule TAKE 1 CAPSULE BY MOUTH EVERY DAY 09/23/23   Alvan Dorn JULIANNA, MD  diltiazem  (CARDIZEM ) 30 MG tablet Take 1 tablet (30 mg total) by mouth daily as needed (afib). 07/08/21   Alvan Dorn JULIANNA, MD  furosemide  (LASIX ) 20 MG tablet TAKE 3 TABLETS BY MOUTH DAILY.MAY TAKE AN ADDITIONAL 20 MG DAILY AS NEEDED FOR WEIGHT > 263 LBS OR SWELLING 12/01/23   Alvan Dorn JULIANNA, MD  gentamicin (GARAMYCIN) 0.3 % ophthalmic solution Place 1 drop into both eyes 3 (three) times daily. 02/15/24   [provider]  hydrocortisone  cream 0.5 % Apply 1 Application topically 2 (two) times daily as needed for itching. 10/10/22   Mealor, Augustus E, MD  ibuprofen (ADVIL) 200 MG tablet Take 200 mg by mouth every 6 (six) hours as needed for fever, headache or mild pain.    [provider]  metoprolol  tartrate (LOPRESSOR ) 25 MG tablet Take 0.5 tablets (12.5 mg total) by mouth 2 (two) times daily. 01/21/24   Miriam Norris, NP  XARELTO  20 MG TABS tablet TAKE ONE TABLET BY MOUTH EVERY DAY 02/09/24   Alvan Dorn JULIANNA, MD    Allergies: Codeine and Meloxicam    Review of Systems  Constitutional:  Negative for chills and fever.  Respiratory:  Positive for shortness of breath.   Cardiovascular:  Positive for chest pain and leg swelling.  Gastrointestinal:  Negative for nausea and vomiting.  Neurological:  Positive for headaches. Negative for syncope and numbness.    Updated Vital Signs BP (!) 150/60 (BP Location: Left Arm)   Pulse 71   Temp 98.3 F (36.8 C) (Oral)   Resp (!) 28   Wt 117.9 kg   SpO2 93%   BMI 43.27 kg/m   Physical Exam Vitals and nursing note reviewed.  Constitutional:      General: She is not in acute distress.    Appearance: She is well-developed. She is ill-appearing. She is not toxic-appearing or  diaphoretic.  HENT:     Mouth/Throat:     Mouth: Mucous membranes are moist.  Cardiovascular:     Rate and Rhythm: Normal rate and regular rhythm.     Pulses: Normal pulses.  Pulmonary:     Effort: Respiratory distress present.     Comments: Coarse lung sounds bilaterally.  No wheezing.  Patient currently on 3 L O2 with labored breathing. Chest:     Chest wall: No tenderness.  Abdominal:     General: There is no distension.     Palpations: Abdomen is soft.     Tenderness: There is no abdominal tenderness.  Musculoskeletal:        General: No swelling.     Right lower leg: Edema present.     Left lower leg: Edema present.     Comments: Mild pitting edema the bilateral lower extremities.  No excessive warmth or erythema.  Skin:    General: Skin is warm.     Capillary Refill: Capillary refill takes less than 2 seconds.  Neurological:     General: No focal deficit present.     Mental Status: She is alert.     Motor: No weakness.     (all labs ordered are listed, but only abnormal results are displayed) Labs Reviewed  CBC WITH DIFFERENTIAL/PLATELET - Abnormal; Notable for the following components:      Result Value   WBC 12.8 (*)    Neutro Abs 10.6 (*)    All other components within normal limits  COMPREHENSIVE METABOLIC PANEL WITH GFR - Abnormal; Notable for the following components:   CO2 33 (*)    Glucose, Bld 125 (*)    Calcium 8.7 (*)    All other components within normal limits  PRO BRAIN NATRIURETIC PEPTIDE - Abnormal; Notable for the following components:   Pro Brain Natriuretic Peptide 475.0 (*)    All other components within normal limits  BLOOD GAS, VENOUS - Abnormal; Notable for the following components:   pO2, Ven 48 (*)    Bicarbonate 35.5 (*)    Acid-Base Excess 8.4 (*)    All other components within normal limits  RESP PANEL BY RT-PCR (RSV, FLU A&B, COVID)  RVPGX2  MAGNESIUM  TROPONIN T, HIGH SENSITIVITY  TROPONIN T, HIGH SENSITIVITY     EKG: None  Radiology: CT Chest High Resolution Result Date: 02/25/2024 EXAM: HIGH RESOLUTION CHEST 02/25/2024 08:16:07 PM TECHNIQUE: CT of the chest was performed without the administration of intravenous contrast. High resolution CT imaging was performed of the lungs. Multiplanar reformatted images are provided for review. Automated exposure control, iterative reconstruction, and/or weight based adjustment of the mA/kV was utilized to reduce the radiation dose to as low as reasonably achievable. High resolution CT images were performed in the supine inspiration, supine expiration,  and prone inspiration positions. COMPARISON: 08/08/2022 CLINICAL HISTORY: Heart failure, new ischemic symptoms. FINDINGS: MEDIASTINUM AND LYMPH NODES: Global cardiac size is mildly enlarged. Moderate coronary artery calcification. No pericardial effusion. The central pulmonary arteries are enlarged in keeping with changes of pulmonary arterial hypertension. Mild atherosclerotic calcification within the thoracic aorta. No aortic aneurysm. No mediastinal, hilar or axillary lymphadenopathy. HRCT FINDINGS AND LUNGS AND PLEURA: No pneumothorax or pleural effusion. The central airways are clear. Mosaic attenuation of the pulmonary parenchyma seen in keeping with multifocal air trapping suggesting small airway disease. Superimposed discoid atelectasis within the left mid lung zone. No confluent pulmonary infiltrate. No significant pulmonary nodules. No significant regions of subpleural reticulation or groundglass opacity. No traction bronchiectasis. No frank honeycombing. No significant air trapping or tracheobronchomalacia on the expiration sequence. UPPER ABDOMEN: No acute abnormality. SOFT TISSUES AND BONES: Osseous structures are age appropriate. No acute bone abnormality. No lytic or blastic bone lesion. No acute abnormality of the soft tissues. IMPRESSION: 1. Mosaic attenuation with multifocal air trapping, consistent with  small airway disease; superimposed discoid atelectasis in the left mid lung. No confluent pulmonary infiltrate, pneumothorax, or pleural effusion. 2. Enlarged central pulmonary arteries, consistent with pulmonary arterial hypertension. 3. Mild cardiomegaly 4. raf score: aortic atherosclerosis (icd10-i70.0) Electronically signed by: Dorethia Molt MD 02/25/2024 08:50 PM EDT RP Workstation: [id number]   DG Chest Port 1 View Result Date: 02/25/2024 EXAM: 1 VIEW XRAY OF THE CHEST 02/25/2024 06:54:00 PM COMPARISON: 06/10/2021, 06/02/2022, and 06/21/2022. CLINICAL HISTORY: SOB (shortness of breath). FINDINGS: LUNGS AND PLEURA: No focal pulmonary opacity. Probable mild interstitial edema. No pleural effusion. No pneumothorax. HEART AND MEDIASTINUM: Chronic cardiomegaly. Central vascular congestion. Aortic atherosclerosis. BONES AND SOFT TISSUES: No acute osseous abnormality. IMPRESSION: 1. Cardiomegaly with Central vascular congestion with probable mild interstitial edema. 2. No consolidation, pleural effusion, or pneumothorax. 3. Aortic atherosclerosis. Electronically signed by: Luke Bun MD 02/25/2024 07:01 PM EDT RP Workstation: [id number]     Procedures   Medications Ordered in the ED - No data to display                                  Medical Decision Making   Patient here with history of atrial fibrillation, CHF, COPD chest tightness and worsening shortness of breath despite 3 L oxygen.  She is followed by pulmonology, has recently seen Dr. Jude and a trial of tapering down her supplemental oxygen.  She also endorses some lower extremity edema recently and increased her Lasix  to 80 mg daily for 2 days, she is now back to her 60 mg dose.  Denies any fever or chills.  On exam, patient uncomfortable appearing currently on 3 L O2 hypoxic on exertion, appears volume overloaded.  I suspect this is CHF exacerbation, COPD exacerbation, pneumonia, ACS, also considered.  Patient is anticoagulated  denies any missed doses of her Xarelto .  Amount and/or Complexity of Data Reviewed Labs: ordered.    Details: Labs no significant leukocytosis, chemistries show alkalosis with bicarb 35, BNP elevated 475 respiratory panel negative, delta troponin reassuring.  VBG shows normal CO2 Radiology: ordered.    Details: Chest x-ray shows cardiomegaly with central vascular congestion no consolidation or pleural effusion  CT chest high-resolution ordered for further evaluation.  Likely small airway disease and pulmonary arterial hypertension ECG/medicine tests: ordered.    Details: EKG shows ectopic atrial rhythm nonspecific T wave abnormality Discussion of management or test interpretation with external provider(s):  Initially patient had increased work of breathing and tachypnea on her routine 3 L, she was placed on 5 L and symptoms improved  Workup consistent with CHF exacerbation, CT imaging of the chest shows pulmonary hypertension.  Patient improving with diuresis and albuterol  nebs.  Will need hospital admission for further diuresis  Discussed findings with [facility] hospitalist, Dr. Pearlean who agrees to admit  Risk OTC drugs. Prescription drug management. Decision regarding hospitalization.        Final diagnoses:  Acute on chronic congestive heart failure, unspecified heart failure type [facility])    ED Discharge Orders     None          Herlinda Milling, PA-C 02/25/24 2252    Yolande Lamar BROCKS, MD 02/28/24 1325

## 2024-02-25 NOTE — ED Triage Notes (Signed)
 Pt arrives with c/o SOB that worsened 2 days ago. Pt reports CP and SOB is worse with exertion and oxygen sat drops into the 70s with O2. Pt wears 2-3L at home.

## 2024-02-26 DIAGNOSIS — I5033 Acute on chronic diastolic (congestive) heart failure: Secondary | ICD-10-CM | POA: Diagnosis not present

## 2024-02-26 LAB — BASIC METABOLIC PANEL WITH GFR
Anion gap: 7 (ref 5–15)
BUN: 8 mg/dL (ref 8–23)
CO2: 36 mmol/L — ABNORMAL HIGH (ref 22–32)
Calcium: 8.6 mg/dL — ABNORMAL LOW (ref 8.9–10.3)
Chloride: 99 mmol/L (ref 98–111)
Creatinine, Ser: 0.82 mg/dL (ref 0.44–1.00)
GFR, Estimated: >60 mL/min (ref >60)
Glucose, Bld: 100 mg/dL — ABNORMAL HIGH (ref 70–99)
Potassium: 4.1 mmol/L (ref 3.5–5.1)
Sodium: 142 mmol/L (ref 135–145)

## 2024-02-26 LAB — CBC
HCT: 38.1 % (ref 36.0–46.0)
Hemoglobin: 11.8 g/dL — ABNORMAL LOW (ref 12.0–15.0)
MCH: 28 pg (ref 26.0–34.0)
MCHC: 31 g/dL (ref 30.0–36.0)
MCV: 90.5 fL (ref 80.0–100.0)
Platelets: 315 K/uL (ref 150–400)
RBC: 4.21 MIL/uL (ref 3.87–5.11)
RDW: 14.1 % (ref 11.5–15.5)
WBC: 9.8 K/uL (ref 4.0–10.5)
nRBC: 0 % (ref 0.0–0.2)

## 2024-02-26 LAB — PRO BRAIN NATRIURETIC PEPTIDE: Pro Brain Natriuretic Peptide: 435 pg/mL — ABNORMAL HIGH (ref <300.0)

## 2024-02-26 MED ORDER — GUAIFENESIN-DM 100-10 MG/5ML PO SYRP: 15.0000 mL | ORAL_SOLUTION | Freq: Three times a day (TID) | ORAL | 0 refills | Status: DC

## 2024-02-26 MED ORDER — LEVOFLOXACIN 750 MG PO TABS: 750.0000 mg | ORAL_TABLET | Freq: Every day | ORAL | 0 refills | Status: AC

## 2024-02-26 NOTE — TOC CM/SW Note (Signed)
 Transition of Care [facility]) - Inpatient Brief Assessment   Patient Details  Name: KANIYA TRUEHEART MRN: [id number] Date of Birth: 04-05-1946  Transition of Care [facility]) CM/SW Contact:    Lucie Lunger, LCSWA Phone Number: 02/26/2024, 9:21 AM   Clinical Narrative: Transition of Care Department [facility]) has reviewed patient and no TOC needs have been identified at this time. We will continue to monitor patient advancement through interdiciplinary progression rounds. If new patient transition needs arise, please place a TOC consult.  Transition of Care Asessment: Insurance and Status: Insurance coverage has been reviewed Patient has primary care physician: Yes Home environment has been reviewed: From home Prior level of function:: Independent Prior/Current Home Services: No current home services Social Drivers of Health Review: SDOH reviewed no interventions necessary Readmission risk has been reviewed: Yes Transition of care needs: no transition of care needs at this time

## 2024-02-26 NOTE — Plan of Care (Signed)

## 2024-02-26 NOTE — Discharge Summary (Signed)
 Physician Discharge Summary   Patient: Brooke Luna MRN: [id number] DOB: March 20, 1946  Admit date:     02/25/2024  Discharge date: 02/26/24  Discharge Physician: Adriana DELENA Grams   PCP: Rosamond Leta NOVAK, MD   Recommendations at discharge:   Follow to PCP in 1 week Continue current medications  Discharge Diagnoses: Principal Problem:   Acute on chronic diastolic (congestive) heart failure (HCC) Active Problems:   COPD (chronic obstructive pulmonary disease) (HCC)   Atrial fibrillation (HCC)   Chronic respiratory failure with hypoxia (HCC)   Essential hypertension   Hypothyroidism  Brooke Luna is a 78 y.o. female with medical history significant for diastolic CHF, COPD, atrial fibrillation, hypertension, hypothyroidism, chronic respiratory failure. Patient presented to ED with complaints of difficulty breathing, started about 2 days ago.  She also reports chest tightness, dry cough.  She had a recent trip to [facility], and reports some lower extremity swelling after the trip, worse on the left.  She reports baseline weight is 264 pounds, and she has added about 3 to 4 pounds.  she is on Lasix  60 mg daily and has been compliant, she was told to increase her Lasix  if she had swelling-she increased to 80 mg daily for 2 days and is back to her regular dose.  She reports compliance with her Xarelto .  She is on 2 to 3 L at baseline.  She follows with Dr. Jude as outpatient, and he is trying to wean her O2 down-currently she is now using her O2 mostly with activity.    ED Course: Temperature 98.3.  Heart rate 60s.  Respiratory rate 14-28.  Blood pressure systolic 120s to 849d.  O2 sats > 93% on 3 L.   Troponin less than 15 x 2  proBNP 475.  Magnesium 2.1.   VBG shows pH of 7.38, pCO2 of 60.  WBC 12.8. IV Lasix  60 mg x 1 given.  DuoNebs given. As High-resolution CT was already ordered by pulmonologist, this was done in the ED- Mosaic attenuation with multifocal air trapping, consistent  with small airway disease; superimposed discoid atelectasis in the left mid lung. No confluent pulmonary infiltrate, pneumothorax, or pleural effusion. Hospitalist admit for CHF.   Acute on chronic diastolic CH Improved shortness of breath, back to baseline, satting 97% on 3 L of oxygen  F-dyspnea likely mild component of both CHF and possibly pneumonia. Reports 3 to 4 pound weight gain, trace bilateral lower extremity swelling.  proBNP 475. (No prior to compare).  Last echo 08/2023 EF 60 to 65%. - IV Lasix  60 mg x 1 given in ED,>>> - resume home dose Lasix  60 mg daily in a.m.   COPD  Improved cough, shortness of breath, satting 97% at baseline 3 L  No rhonchi or wheeze.  WBC 12.8.  High-resolution CT- was ordered as outpatient by pulmonologist, so was done as imaging of choice in ED today.  Mosaic attenuation with multifocal air trapping, consistent with small airway disease; superimposed discoid atelectasis in the left mid lung. No confluent pulmonary infiltrate, pneumothorax, or pleural effusion.  Has been compliant with Xarelto . - Treat small airway disease as pneumonia at this time, with IV ceftriaxone and azithromycin  >>> switch to p.o. Levaquin - Continue inhalers, mucolytics -No excessive signs of exacerbation wheezing, no steroids were initiated    Chronic respiratory failure-on 2 to 3 L.  At home satting currently at 97% back to 3 L of oxygen   Atrial fibrillation-rate controlled and on anticoagulation with Xarelto . -  Resume diltiazem , Xarelto , metoprolol    Hypertension-stable. - Resume diltiazem , metoprolol , diuretics   Hypothyroidism -Resume Synthroid   Disposition: Home Diet recommendation:  Discharge Diet Orders (From admission, onward)     Start     Ordered   02/26/24 0000  Diet - low sodium heart healthy        02/26/24 1039           Cardiac and Carb modified diet DISCHARGE MEDICATION: Allergies as of 02/26/2024       Reactions   Codeine Shortness Of  Breath, Itching   Meloxicam Other (See Comments)   Stomach iritation        Medication List     TAKE these medications    citalopram  20 MG tablet Commonly known as: CELEXA  Take 20 mg by mouth daily.   clonazePAM  0.5 MG tablet Commonly known as: KLONOPIN  Take 0.5 mg by mouth 2 (two) times daily as needed for anxiety.   cyanocobalamin 1000 MCG tablet Commonly known as: VITAMIN B12 Take 1,000 mcg by mouth daily.   diltiazem  120 MG 24 hr capsule Commonly known as: CARDIZEM  CD TAKE 1 CAPSULE BY MOUTH EVERY DAY   diltiazem  30 MG tablet Commonly known as: CARDIZEM  Take 1 tablet (30 mg total) by mouth daily as needed (afib).   furosemide  20 MG tablet Commonly known as: LASIX  TAKE 3 TABLETS BY MOUTH DAILY.MAY TAKE AN ADDITIONAL 20 MG DAILY AS NEEDED FOR WEIGHT > 263 LBS OR SWELLING   gentamicin 0.3 % ophthalmic solution Commonly known as: GARAMYCIN Place 1 drop into both eyes 3 (three) times daily.   guaiFENesin-dextromethorphan 100-10 MG/5ML syrup Commonly known as: ROBITUSSIN DM Take 15 mLs by mouth every 8 (eight) hours.   ibuprofen 200 MG tablet Commonly known as: ADVIL Take 200 mg by mouth every 6 (six) hours as needed for fever, headache or mild pain.   levofloxacin 750 MG tablet Commonly known as: Levaquin Take 1 tablet (750 mg total) by mouth daily for 4 days.   metoprolol  tartrate 25 MG tablet Commonly known as: LOPRESSOR  Take 0.5 tablets (12.5 mg total) by mouth 2 (two) times daily.   Xarelto  20 MG Tabs tablet Generic drug: rivaroxaban  TAKE ONE TABLET BY MOUTH EVERY DAY        Discharge Exam: Filed Weights   02/25/24 1827 02/25/24 2321  Weight: 117.9 kg 118 kg        General:  AAO x 3,  cooperative, no distress;   HEENT:  Normocephalic, PERRL, otherwise with in Normal limits   Neuro:  CNII-XII intact. , normal motor and sensation, reflexes intact   Lungs:   Clear to auscultation BL, Respirations unlabored,  No wheezes / crackles  Cardio:     S1/S2, RRR, No murmure, No Rubs or Gallops   Abdomen:  Soft, non-tender, bowel sounds active all four quadrants, no guarding or peritoneal signs.  Muscular  skeletal:  Limited exam -global generalized weaknesses - in bed, able to move all 4 extremities,   2+ pulses,  symmetric, No pitting edema  Skin:  Dry, warm to touch, negative for any Rashes,  Wounds: Please see nursing documentation          Condition at discharge: good  The results of significant diagnostics from this hospitalization (including imaging, microbiology, ancillary and laboratory) are listed below for reference.   Imaging Studies: CT Chest High Resolution Result Date: 02/25/2024 EXAM: HIGH RESOLUTION CHEST 02/25/2024 08:16:07 PM TECHNIQUE: CT of the chest was performed without the administration of intravenous  contrast. High resolution CT imaging was performed of the lungs. Multiplanar reformatted images are provided for review. Automated exposure control, iterative reconstruction, and/or weight based adjustment of the mA/kV was utilized to reduce the radiation dose to as low as reasonably achievable. High resolution CT images were performed in the supine inspiration, supine expiration, and prone inspiration positions. COMPARISON: 08/08/2022 CLINICAL HISTORY: Heart failure, new ischemic symptoms. FINDINGS: MEDIASTINUM AND LYMPH NODES: Global cardiac size is mildly enlarged. Moderate coronary artery calcification. No pericardial effusion. The central pulmonary arteries are enlarged in keeping with changes of pulmonary arterial hypertension. Mild atherosclerotic calcification within the thoracic aorta. No aortic aneurysm. No mediastinal, hilar or axillary lymphadenopathy. HRCT FINDINGS AND LUNGS AND PLEURA: No pneumothorax or pleural effusion. The central airways are clear. Mosaic attenuation of the pulmonary parenchyma seen in keeping with multifocal air trapping suggesting small airway disease. Superimposed discoid  atelectasis within the left mid lung zone. No confluent pulmonary infiltrate. No significant pulmonary nodules. No significant regions of subpleural reticulation or groundglass opacity. No traction bronchiectasis. No frank honeycombing. No significant air trapping or tracheobronchomalacia on the expiration sequence. UPPER ABDOMEN: No acute abnormality. SOFT TISSUES AND BONES: Osseous structures are age appropriate. No acute bone abnormality. No lytic or blastic bone lesion. No acute abnormality of the soft tissues. IMPRESSION: 1. Mosaic attenuation with multifocal air trapping, consistent with small airway disease; superimposed discoid atelectasis in the left mid lung. No confluent pulmonary infiltrate, pneumothorax, or pleural effusion. 2. Enlarged central pulmonary arteries, consistent with pulmonary arterial hypertension. 3. Mild cardiomegaly 4. raf score: aortic atherosclerosis (icd10-i70.0) Electronically signed by: Dorethia Molt MD 02/25/2024 08:50 PM EDT RP Workstation: [id number]   DG Chest Port 1 View Result Date: 02/25/2024 EXAM: 1 VIEW XRAY OF THE CHEST 02/25/2024 06:54:00 PM COMPARISON: 06/10/2021, 06/02/2022, and 06/21/2022. CLINICAL HISTORY: SOB (shortness of breath). FINDINGS: LUNGS AND PLEURA: No focal pulmonary opacity. Probable mild interstitial edema. No pleural effusion. No pneumothorax. HEART AND MEDIASTINUM: Chronic cardiomegaly. Central vascular congestion. Aortic atherosclerosis. BONES AND SOFT TISSUES: No acute osseous abnormality. IMPRESSION: 1. Cardiomegaly with Central vascular congestion with probable mild interstitial edema. 2. No consolidation, pleural effusion, or pneumothorax. 3. Aortic atherosclerosis. Electronically signed by: Luke Bun MD 02/25/2024 07:01 PM EDT RP Workstation: [id number]    Microbiology: Results for orders placed or performed during the hospital encounter of 02/25/24  Resp panel by RT-PCR (RSV, Flu A&B, Covid) Anterior Nasal Swab     Status: None    Collection Time: 02/25/24  7:31 PM   Specimen: Anterior Nasal Swab  Result Value Ref Range Status   SARS Coronavirus 2 by RT PCR NEGATIVE NEGATIVE Final    Comment: (NOTE) SARS-CoV-2 target nucleic acids are NOT DETECTED.  The SARS-CoV-2 RNA is generally detectable in upper respiratory specimens during the acute phase of infection. The lowest concentration of SARS-CoV-2 viral copies this assay can detect is 138 copies/mL. A negative result does not preclude SARS-Cov-2 infection and should not be used as the sole basis for treatment or other patient management decisions. A negative result may occur with  improper specimen collection/handling, submission of specimen other than nasopharyngeal swab, presence of viral mutation(s) within the areas targeted by this assay, and inadequate number of viral copies(<138 copies/mL). A negative result must be combined with clinical observations, patient history, and epidemiological information. The expected result is Negative.  Fact Sheet for Patients:  bloggercourse.com  Fact Sheet for Healthcare Providers:  seriousbroker.it  This test is no t yet approved or cleared by the [place]   FDA and  has been authorized for detection and/or diagnosis of SARS-CoV-2 by FDA under an Emergency Use Authorization (EUA). This EUA will remain  in effect (meaning this test can be used) for the duration of the COVID-19 declaration under Section 564(b)(1) of the Act, 21 U.S.C.section 360bbb-3(b)(1), unless the authorization is terminated  or revoked sooner.       Influenza A by PCR NEGATIVE NEGATIVE Final   Influenza B by PCR NEGATIVE NEGATIVE Final    Comment: (NOTE) The Xpert Xpress SARS-CoV-2/FLU/RSV plus assay is intended as an aid in the diagnosis of influenza from Nasopharyngeal swab specimens and should not be used as a sole basis for treatment. Nasal washings and aspirates are unacceptable for  Xpert Xpress SARS-CoV-2/FLU/RSV testing.  Fact Sheet for Patients: bloggercourse.com  Fact Sheet for Healthcare Providers: seriousbroker.it  This test is not yet approved or cleared by the [place]  FDA and has been authorized for detection and/or diagnosis of SARS-CoV-2 by FDA under an Emergency Use Authorization (EUA). This EUA will remain in effect (meaning this test can be used) for the duration of the COVID-19 declaration under Section 564(b)(1) of the Act, 21 U.S.C. section 360bbb-3(b)(1), unless the authorization is terminated or revoked.     Resp Syncytial Virus by PCR NEGATIVE NEGATIVE Final    Comment: (NOTE) Fact Sheet for Patients: bloggercourse.com  Fact Sheet for Healthcare Providers: seriousbroker.it  This test is not yet approved or cleared by the [place]  FDA and has been authorized for detection and/or diagnosis of SARS-CoV-2 by FDA under an Emergency Use Authorization (EUA). This EUA will remain in effect (meaning this test can be used) for the duration of the COVID-19 declaration under Section 564(b)(1) of the Act, 21 U.S.C. section 360bbb-3(b)(1), unless the authorization is terminated or revoked.  Performed at [facility], [address], [place], [place] [address]     Labs: CBC: Recent Labs  Lab 02/25/24 1919 02/26/24 0416  WBC 12.8* 9.8  NEUTROABS 10.6*  --   HGB 12.4 11.8*  HCT 38.9 38.1  MCV 89.0 90.5  PLT 319 315   Basic Metabolic Panel: Recent Labs  Lab 02/25/24 1919 02/26/24 0416  NA 139 142  K 4.1 4.1  CL 98 99  CO2 33* 36*  GLUCOSE 125* 100*  BUN 10 8  CREATININE 0.70 0.82  CALCIUM 8.7* 8.6*  MG 2.1  --    Liver Function Tests: Recent Labs  Lab 02/25/24 1919  AST 15  ALT 11  ALKPHOS 82  BILITOT 0.6  PROT 6.5  ALBUMIN 4.0   CBG: No results for input(s): GLUCAP in the last 168 hours.  Discharge time  spent: greater than 30 minutes.  Signed: Adriana DELENA Grams, MD [facility] 02/26/2024

## 2024-02-27 ENCOUNTER — Encounter (HOSPITAL_COMMUNITY): Payer: Self-pay

## 2024-02-27 ENCOUNTER — Ambulatory Visit (HOSPITAL_COMMUNITY): Admission: RE | Admit: 2024-02-27 | Source: Ambulatory Visit

## 2024-03-16 ENCOUNTER — Other Ambulatory Visit: Payer: Self-pay | Admitting: Cardiology

## 2024-04-01 ENCOUNTER — Encounter: Payer: Self-pay | Admitting: Cardiovascular Disease

## 2024-04-01 ENCOUNTER — Ambulatory Visit: Attending: Cardiovascular Disease | Admitting: Cardiovascular Disease

## 2024-04-01 VITALS — BP 122/74 | HR 64 | Ht 65.0 in | Wt 273.8 lb

## 2024-04-01 DIAGNOSIS — I480 Paroxysmal atrial fibrillation: Secondary | ICD-10-CM | POA: Diagnosis not present

## 2024-04-01 NOTE — Progress Notes (Signed)
   PCP: Rosamond Leta NOVAK, MD Primary Cardiologist: Dr Alvan Primary EP: Dr Nancey  Brooke Luna is a 78 y.o. female who presents today for routine electrophysiology followup.    She underwent AF and flutter ablation by Dr. Kelsie in Feb 2021. She was admitted with AF in 03/2022 and started on flecainide . I discontinued this due to CAD diagnosed on cath in 2006. I recommended Tikosyn but she wanted to monitor. She has had recurrence of AF documented with a Kardia mobile device.   She is not very active.  + SOB with moderate activity.  Today, she denies symptoms of palpitations, chest pain,  lower extremity edema, dizziness, presyncope, or syncope.  The patient is otherwise without complaint today.   Amiodarone  was started at her visit in July 2024.  It was discontinued by her pulmonologist shortly after due to concerns for progressive dyspnea      ROS- all systems are reviewed and negatives except as per HPI above     Physical Exam: Vitals:   04/01/24 1426  BP: 122/74  Pulse: 64  SpO2: 97%  Weight: 273 lb 12.8 oz (124.2 kg)  Height: 5' 5 (1.651 m)      Gen: Appears comfortable, well-nourished CV: RRR, trace dependent edema Pulm: breathing easily on supplemental oxygen   Wt Readings from Last 3 Encounters:  04/01/24 273 lb 12.8 oz (124.2 kg)  02/25/24 260 lb 2.3 oz (118 kg)  02/04/24 260 lb (117.9 kg)    EKG Interpretation Date/Time:  Friday April 01 2024 14:32:52 EST Ventricular Rate:  63 PR Interval:  164 QRS Duration:  92 QT Interval:  454 QTC Calculation: 464 R Axis:   70  Text Interpretation: Normal sinus rhythm Normal ECG When compared with ECG of 25-Feb-2024 18:42, PREVIOUS ECG IS PRESENT Confirmed by Nancey Scotts [phone]) on 04/01/2024 2:47:49 PM    Assessment and Plan:  Paroxysmal atrial fibrillation S/p ablation 05/2019 Flecainide  discontinued due to diagnosis of CAD I have advised lifestyle modification.   She will almost certainly have  additional afib without substantial weight loss and exercise Amiodarone  was discontinued by her pulmonologist She is not a candidate for Tikosyn due to her prolonged QT I don't see any additional options for rhythm management.   2. HTN Stable No change required today  3. Obesity Body mass index is 45.56 kg/m. Lifestyle modification advised  4. SOB Likely due largely to deconditioning, pulmonary disease Echo 03/2022 reviewed     Scotts FORBES Nancey, MD 04/01/2024 2:48 PM

## 2024-04-01 NOTE — Patient Instructions (Signed)
Medication Instructions:  Continue all current medications.  Labwork: none  Testing/Procedures: none  Follow-Up: As needed.    Any Other Special Instructions Will Be Listed Below (If Applicable).  If you need a refill on your cardiac medications before your next appointment, please call your pharmacy.  

## 2024-04-18 ENCOUNTER — Encounter: Payer: Self-pay | Admitting: Nurse Practitioner

## 2024-04-18 ENCOUNTER — Ambulatory Visit: Attending: Nurse Practitioner | Admitting: Nurse Practitioner

## 2024-04-18 VITALS — BP 138/72 | HR 70 | Ht 65.0 in | Wt 270.6 lb

## 2024-04-18 DIAGNOSIS — I480 Paroxysmal atrial fibrillation: Secondary | ICD-10-CM | POA: Diagnosis not present

## 2024-04-18 DIAGNOSIS — R0609 Other forms of dyspnea: Secondary | ICD-10-CM | POA: Diagnosis not present

## 2024-04-18 DIAGNOSIS — I2510 Atherosclerotic heart disease of native coronary artery without angina pectoris: Secondary | ICD-10-CM | POA: Diagnosis not present

## 2024-04-18 DIAGNOSIS — I10 Essential (primary) hypertension: Secondary | ICD-10-CM | POA: Diagnosis not present

## 2024-04-18 DIAGNOSIS — J449 Chronic obstructive pulmonary disease, unspecified: Secondary | ICD-10-CM | POA: Diagnosis not present

## 2024-04-18 DIAGNOSIS — I5032 Chronic diastolic (congestive) heart failure: Secondary | ICD-10-CM

## 2024-04-18 NOTE — Patient Instructions (Signed)
 Medication Instructions:  Your physician recommends that you continue on your current medications as directed. Please refer to the Current Medication list given to you today.   Labwork: None  Testing/Procedures: None  Follow-Up: Your physician recommends that you schedule a follow-up appointment in: 3 months  Any Other Special Instructions Will Be Listed Below (If Applicable). Please send MyChart message regarding medication concern  HEART FAILURE INSTRUCTION SHEET  Follow a low-salt diet-you are allowed no more than 2,000 mg of sodium per day. Watch your fluid intake. In general, you should not be taking more than 64 ounces a day (no more than 8 glasses per day). Sometimes we refer to this as 2 liters per day. This includes sources of water in food like soup, coffee, tea, milk etc. Weigh yourself on the same scale at the same time of the day preferably immediately after your first void. Keep a log of your weights. Call your doctor: (Anytime you feel any of the following symptoms)  3 lbs weight gain overnight or 5 lbs within a week Shortness of breath, with or without a day hacking cough Swelling in hands, feet or stomach If you have to sleep on extra pillows at night in order to breathe   IT IS IMPORTANT TO LET YOUR DOCTOR KNOW EARLY ON IF YOU ARE HAVING SYMPTOMS SO WE CAN HELP YOU!    Thank you for choosing [facility]!     If you need a refill on your cardiac medications before your next appointment, please call your pharmacy.

## 2024-04-18 NOTE — Progress Notes (Signed)
 " Cardiology Office Note:    Date:  04/18/2024 ID:  Brooke Luna, DOB [birth year], MRN [id number] PCP:  Rosamond Leta NOVAK, MD [facility] Providers Cardiologist:  Alvan Carrier, MD Electrophysiologist:  Eulas FORBES Furbish, MD    Referring MD: Rosamond Leta NOVAK, MD   CC: Here for 3 month follow-up  History of Present Illness:    Brooke Luna is a 78 y.o. female with a PMH of nonobstructive CAD, persistent A-fib/A-flutter, s/p ablation in 2021, COPD on chronic O2, chronic venous insufficiency, GERD, anxiety, and morbid obesity, who presents today for 3 month follow-up.  Diagnosed with nonobstructive CAD by cardiac cath in 2006.  Patient was diagnosed with A-fib/a flutter in 2016 during hospital admission at [facility].  She has been previously followed by Dr. Kelsie and underwent ablation in 2021.  Most recently was admitted in March 2023 with a flutter with RVR, converted in the ED, s/p DCCV at that time.  Last seen by Dr. Carrier Alvan on Sep 03, 2023, showed signs of volume overload. Lasix  increased to 60 mg BID.   10/16/2023 - She is here for follow-up with her husband. Weights are stable. Has not noticed difference with medication change since last office visit. Denies any chest pain, palpitations, syncope, presyncope, dizziness, orthopnea, PND, swelling or significant weight changes, acute bleeding, or claudication. Breathing is stable per her report.   01/21/2024 - Here for follow-up.  She admits to recent issues with A-fib nearly daily for the past 3 weeks and admits to past hx of ablation and past hx of 5 DCCV.  Remains compliant with medication regimen.  She confirms to me she is taking metoprolol  tartrate 25 mg in the morning.  Denies any chest pain, shortness of breath, syncope, presyncope, dizziness, orthopnea, PND, swelling or significant weight changes, acute bleeding, or claudication.   Hospitalized 01/2024 for acute on chronic CHF. She had dyspnea that was felt  to be likely from mild component of both CHF and possibly PNA, proBNP 475.   04/18/2024 - She is here for follow-up. She states has noticed increased shortness of breath and has noticed weight gain, and she attributes her weight gain to eating more. She wonders if her shortness of breath is also related to her allergies. Did have an episode of chest pain in the past that she says felt like an elephant was sitting on her chest but denies any recurrent episodes. She would like to know if she can be started on medication for weight loss. Denies any recent chest pain, palpitations, syncope, presyncope, dizziness, orthopnea, PND, swelling or significant weight changes, acute bleeding, or claudication.  Please see the history of present illness.    All other systems reviewed and are negative.  EKGs/Labs/Other Studies Reviewed:    The following studies were reviewed today:  EKG:  EKG is not ordered today.   Echo 08/2023:  1. Left ventricular ejection fraction, by estimation, is 60 to 65%. The  left ventricle has normal function. The left ventricle has no regional  wall motion abnormalities. Left ventricular diastolic parameters are  indeterminate.   2. Right ventricular systolic function is normal. The right ventricular  size is normal. Tricuspid regurgitation signal is inadequate for assessing  PA pressure.   3. The mitral valve is normal in structure. No evidence of mitral valve  regurgitation. No evidence of mitral stenosis.   4. The aortic valve is tricuspid. Aortic valve regurgitation is not  visualized. No aortic stenosis is present.  5. The inferior vena cava is normal in size with greater than 50%  respiratory variability, suggesting right atrial pressure of 3 mmHg.   Comparison(s): A prior study was performed on 04/26/2022. EF 60-65%.  CT angio of chest on April 30, 2022: IMPRESSION: No evidence of pulmonary embolism.   Scattered patchy airspace disease bilaterally, most  prominent in the upper lobes right middle lobe, consistent with a multifocal infectious/inflammatory process. There are distinct nodular opacities in the left upper lobe and left lower lobe, measuring up to 7 mm and 10 mm respectively, which are indeterminate. Prominent bilateral hilar lymph nodes, favored to be reactive. Recommend follow-up chest CT in 3 months.  Echocardiogram on April 26, 2022:   1. EF is challenging in afib. Left ventricular ejection fraction, by  estimation, is 60 to 65%. The left ventricle has normal function. The left  ventricle has no regional wall motion abnormalities. Left ventricular  diastolic parameters are indeterminate.   2. Right ventricular systolic function is normal. The right ventricular  size is normal. Tricuspid regurgitation signal is inadequate for assessing  PA pressure.   3. The mitral valve was not well visualized. No evidence of mitral valve  regurgitation.   4. Aortic valve regurgitation is not visualized.   5. The inferior vena cava is normal in size with greater than 50%  respiratory variability, suggesting right atrial pressure of 3 mmHg.   Comparison(s): No significant change from prior study.  A-fib ablation on June 07, 2019: CONCLUSIONS: 1. Sinus rhythm upon presentation.   2. Intracardiac echo reveals a moderate sized left atrium with four separate pulmonary veins without evidence of pulmonary vein stenosis. 3. Successful electrical isolation and anatomical encircling of all four pulmonary veins with radiofrequency current. 4. No inducible arrhythmias following ablation both on and off of Isuprel  5. No early apparent complications.  Cardiac CT/CTA on June 02, 2019:  IMPRESSION: 1. There is normal pulmonary vein drainage into the left atrium. Measurements as reported   2. There is no thrombus in the left atrial appendage.   3. The esophagus courses posterior to ostium of right inferior pulmonary vein   4. Calcium  score 14 (41st percentile for age/gender). The study was performed without use of NTG so is not optimal for plaque evaluation, but nonobstructive CAD is seen with calcified plaque in the proximal LAD and proximal LCX causing minimal (0-24%) stenosis  1.  No acute findings in the imaged extracardiac chest. 2.  Aortic Atherosclerosis (ICD10-I70.0). 3. Pulmonary artery enlargement suggests pulmonary arterial hypertension. 4. 2 mm right-sided pulmonary nodule. No follow-up needed if patient is low-risk. Non-contrast chest CT can be considered in 12 months if patient is high-risk. This recommendation follows the consensus statement: Guidelines for Management of Incidental Pulmonary Nodules Detected on CT Images: From the Fleischner Society 2017; Radiology 2017; 284:228-243.  Physical Exam:    VS:  BP 138/72 (BP Location: Right Wrist)   Pulse 70   Ht 5' 5 (1.651 m)   Wt 270 lb 9.6 oz (122.7 kg)   SpO2 95%   BMI 45.03 kg/m     Wt Readings from Last 3 Encounters:  04/18/24 270 lb 9.6 oz (122.7 kg)  04/01/24 273 lb 12.8 oz (124.2 kg)  02/25/24 260 lb 2.3 oz (118 kg)    GEN: Morbidly obese, 78 y.o. female in no acute distress, on chronic O2 HEENT: Normal NECK: No JVD; No carotid bruits CARDIAC: S1/S2, RRR, no murmurs, rubs, gallops; 2+ radial pulses, equal bilaterally; 1+ PT,  equal bilaterally. RESPIRATORY:  Clear and diminished to auscultation without adventitious breath sounds MUSCULOSKELETAL:  No edema noted to BLE; No deformity  SKIN: Warm and dry NEUROLOGIC:  Alert and oriented x 3 PSYCHIATRIC:  Normal affect   ASSESSMENT & PLAN:    In order of problems listed above:  Chronic diastolic CHF Stage C, NYHA class I-II symptoms. EF 60-65% in May 2025. Does appear euvolemic and well compensated on exam, however weight is up and pt admits to natural weight gain and not weight gain related to fluid retention.  Will continue current GDMT. Low sodium diet, fluid restriction <2L, and  daily weights encouraged. Educated to contact our office for weight gain of 2 lbs overnight or 5 lbs in one week.                                                                               PAF Denies any recent tachycardia or palpitations.  Heart healthy diet and regular cardiovascular exercise as tolerated encouraged. Continue Xarelto  20 mg daily, denies any bleeding issues while on Xarelto .  Dr. Alvan previously recommended that if she were to have recurrent issues with A-fib, will need to see EP and reconsider Tikosyn most likely. Continue to f/u with EP.   CAD Cath in 2006 revealed nonobstructive CAD and CCTA in 2021 showed minimal plaque with coronary calcium score of 14. Dr. Delford who saw her during hospitalization in 03/2022 stated she had no significant CAD. Stable with no anginal symptoms. No indication for ischemic evaluation. Continue current medication regimen. Heart healthy diet and regular cardiovascular exercise as tolerated encouraged.   HTN BP stable today. BP well controlled at home. Continue current medication regimen. Discussed to monitor BP at home at least 2 hours after medications and sitting for 5-10 minutes. Heart healthy diet and regular cardiovascular exercise encouraged.   4. COPD, DOE Denies any recent shortness of breath or acute exacerbations. Echo unremarkable. Hx of pulmonary nodules. No medication changes at this time. Care and ED precautions discussed. Continue to follow with PCP and pulmonology.  5. Morbid obesity Weight loss via diet and exercise as tolerated encouraged. Discussed the impact being overweight would have on cardiovascular risk.   Disposition: Follow up with me/APP in 61-months or sooner if anything changes.   Medication Adjustments/Labs and Tests Ordered: Current medicines are reviewed at length with the patient today.  Concerns regarding medicines are outlined above.  No orders of the defined types were placed in this encounter.  No  orders of the defined types were placed in this encounter.   Patient Instructions  Medication Instructions:  Your physician recommends that you continue on your current medications as directed. Please refer to the Current Medication list given to you today.   Labwork: None  Testing/Procedures: None  Follow-Up: Your physician recommends that you schedule a follow-up appointment in: 3 months  Any Other Special Instructions Will Be Listed Below (If Applicable). Please send MyChart message regarding medication concern  HEART FAILURE INSTRUCTION SHEET  Follow a low-salt diet-you are allowed no more than 2,000 mg of sodium per day. Watch your fluid intake. In general, you should not be taking more than 64 ounces a day (no  more than 8 glasses per day). Sometimes we refer to this as 2 liters per day. This includes sources of water in food like soup, coffee, tea, milk etc. Weigh yourself on the same scale at the same time of the day preferably immediately after your first void. Keep a log of your weights. Call your doctor: (Anytime you feel any of the following symptoms)  3 lbs weight gain overnight or 5 lbs within a week Shortness of breath, with or without a day hacking cough Swelling in hands, feet or stomach If you have to sleep on extra pillows at night in order to breathe   IT IS IMPORTANT TO LET YOUR DOCTOR KNOW EARLY ON IF YOU ARE HAVING SYMPTOMS SO WE CAN HELP YOU!    Thank you for choosing [facility]!     If you need a refill on your cardiac medications before your next appointment, please call your pharmacy.    Signed, Almarie Crate, NP  "

## 2024-05-03 ENCOUNTER — Encounter (HOSPITAL_BASED_OUTPATIENT_CLINIC_OR_DEPARTMENT_OTHER): Payer: Self-pay | Admitting: Pulmonary Disease

## 2024-05-04 NOTE — Telephone Encounter (Signed)
 Please advise on when pt should schedule follow-up CT was ordered by another provider and is in chart for 02/25/2024

## 2024-05-19 ENCOUNTER — Ambulatory Visit (HOSPITAL_BASED_OUTPATIENT_CLINIC_OR_DEPARTMENT_OTHER): Admitting: Pulmonary Disease

## 2024-05-19 ENCOUNTER — Encounter (HOSPITAL_BASED_OUTPATIENT_CLINIC_OR_DEPARTMENT_OTHER): Payer: Self-pay | Admitting: Pulmonary Disease

## 2024-05-19 VITALS — BP 128/56 | HR 71 | Ht 65.0 in | Wt 265.0 lb

## 2024-05-19 DIAGNOSIS — J9611 Chronic respiratory failure with hypoxia: Secondary | ICD-10-CM

## 2024-05-19 DIAGNOSIS — J9612 Chronic respiratory failure with hypercapnia: Secondary | ICD-10-CM | POA: Diagnosis not present

## 2024-05-19 DIAGNOSIS — G4733 Obstructive sleep apnea (adult) (pediatric): Secondary | ICD-10-CM | POA: Diagnosis not present

## 2024-05-19 DIAGNOSIS — I10 Essential (primary) hypertension: Secondary | ICD-10-CM | POA: Diagnosis not present

## 2024-05-19 DIAGNOSIS — Z87891 Personal history of nicotine dependence: Secondary | ICD-10-CM | POA: Diagnosis not present

## 2024-05-19 NOTE — Progress Notes (Signed)
 "  Subjective:    Patient ID: CLAUDE SWENDSEN, female    DOB: 07/21/1945, 79 y.o.   MRN: [id number] 79 yo ex smoker for FU of chronic resp failure with hypoxia of unclear etiology  PMH -nonobstructive CAD, p ersistent A-fib/A-flutter, s/p ablation in 2021,  Smoker 38 pyrs, quit 1998   D/c on 02 03/3022 p CAP and RAF     Discussed the use of AI scribe software for clinical note transcription with the patient, who gave verbal consent to proceed.  History of Present Illness HIYAB NHEM is a 79 year old female with a history of oxygen dependency who presents with ongoing breathing difficulties. She was referred by Almarie Crate for further evaluation of her breathing difficulties.  She has had exertional dyspnea for about two years following pneumonia. She needs supplemental oxygen to move and walk and struggles to breathe without oxygen at rest. She notes some improvement in breathing when her heart rate increases.  She has intermittent wheezing and a whistling sound with breathing. Multiple inhalers have not helped. She was hospitalized 01/2024 for severe chest tightness and wheezing and received IV Lasix  for fluid overload with partial relief. CT chest at that time showed no scarring or fibrosis.  She currently takes Lasix  60 mg daily with an extra 20 mg as needed for ankle swelling. She is also on Wegovy and has lost 5 pounds while trying to reduce weight to improve dyspnea and decrease oxygen needs.  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 She does not consistently use oxygen at night because it frequently comes off during sleep.  Recent blood gas showed elevated carbon dioxide. She limits salt intake and eats lightly during the day with a larger evening meal.     Significant tests/ events reviewed    VBG 01/2024 7.38/60/48  PFT's 12/2019 ratio 81, FVC 57%, FEV1 61%  ex ERV 30% with DLCO 14.17 (71%) and  corrected to 5.01 (122%) for alv vol    HRCT Chest 01/2024 no ILD , air trapping +  CT chest wo con 07/2022 >>Previous areas of patchy opacities with ground-glass and discrete nodules have shown significant improvement   Echo 08/2023 normal RV  06/25/2022   Walked on 3 lpm POC  x  1  lap(s) =  approx 150  ft  @ slow pace, stopped due to sob  with lowest 02 sats 91% then dropped to 89% on lap 2 and required 4lpm POC to complete  - 06/11/2023   Walked on 3lpm POC   x  1  lap(s) =  approx 150  ft  @ moderate pace, stopped due to sob with lowest 02 sats 97%    Review of Systems  neg for any significant sore throat, dysphagia, itching, sneezing, nasal congestion or excess/ purulent secretions, fever, chills, sweats, unintended wt loss, pleuritic or exertional cp, hempoptysis, orthopnea pnd or change in chronic leg swelling. Also denies presyncope, palpitations, heartburn, abdominal pain, nausea, vomiting, diarrhea or change in bowel or urinary habits, dysuria,hematuria, rash, arthralgias, visual complaints, headache, numbness weakness or ataxia.      Objective:   Physical Exam  Gen. Pleasant, obese, in no distress ENT - no lesions, no post nasal drip Neck: No JVD, no thyromegaly, no carotid bruits Lungs: no use of accessory muscles, no dullness to percussion, decreased without rales or rhonchi  Cardiovascular: Rhythm regular, heart sounds  normal, no murmurs  or gallops, no peripheral edema Musculoskeletal: No deformities, no cyanosis or clubbing , no tremors       Assessment & Plan:   Assessment and Plan Assessment & Plan Chronic respiratory failure with hypoxia and hypercapnia Chronic respiratory failure with hypoxia and hypercapnia. Oxygen therapy improves symptoms during exertion. Recent hospitalization for fluid overload and elevated CO2 levels. Echocardiograms show no evidence of pulmonary hypertension. Weight loss is anticipated to improve respiratory function. - Continue oxygen  therapy as needed during exertion. - Encouraged weight loss with wegovy to potentially improve respiratory function. - Monitor fluid intake and continue Lasix  as prescribed.  Obstructive sleep apnea Suspected obstructive sleep apnea due to elevated CO2 levels and poor sleep quality. Sleep study is planned to confirm diagnosis and assess severity. Weight loss is expected to aid in management. - Ordered home sleep study to assess for obstructive sleep apnea. - If sleep study indicates significant apnea, will consider PAP therapy. - Encouraged weight loss to potentially improve sleep apnea symptoms.  HTN - better on recheck     "

## 2024-05-19 NOTE — Patient Instructions (Signed)
 X HOME SLEEP TEST

## 2024-07-25 ENCOUNTER — Ambulatory Visit: Admitting: Nurse Practitioner
